# Patient Record
Sex: Female | Born: 1975 | Race: Black or African American | Hispanic: No | Marital: Married | Smoking: Former smoker
Health system: Southern US, Community
[De-identification: ages and names within clinical notes are randomized; demographics above are authoritative.]

## PROBLEM LIST (undated history)

## (undated) ENCOUNTER — Inpatient Hospital Stay (HOSPITAL_COMMUNITY): Payer: Self-pay

## (undated) DIAGNOSIS — E785 Hyperlipidemia, unspecified: Secondary | ICD-10-CM

## (undated) DIAGNOSIS — D649 Anemia, unspecified: Secondary | ICD-10-CM

## (undated) DIAGNOSIS — R51 Headache: Secondary | ICD-10-CM

## (undated) DIAGNOSIS — M1990 Unspecified osteoarthritis, unspecified site: Secondary | ICD-10-CM

## (undated) DIAGNOSIS — IMO0001 Reserved for inherently not codable concepts without codable children: Secondary | ICD-10-CM

## (undated) DIAGNOSIS — I10 Essential (primary) hypertension: Secondary | ICD-10-CM

## (undated) DIAGNOSIS — A599 Trichomoniasis, unspecified: Secondary | ICD-10-CM

## (undated) DIAGNOSIS — E119 Type 2 diabetes mellitus without complications: Secondary | ICD-10-CM

## (undated) DIAGNOSIS — O24419 Gestational diabetes mellitus in pregnancy, unspecified control: Secondary | ICD-10-CM

## (undated) DIAGNOSIS — O1493 Unspecified pre-eclampsia, third trimester: Secondary | ICD-10-CM

## (undated) DIAGNOSIS — R0602 Shortness of breath: Secondary | ICD-10-CM

## (undated) DIAGNOSIS — K869 Disease of pancreas, unspecified: Secondary | ICD-10-CM

## (undated) DIAGNOSIS — J302 Other seasonal allergic rhinitis: Secondary | ICD-10-CM

## (undated) DIAGNOSIS — F419 Anxiety disorder, unspecified: Secondary | ICD-10-CM

## (undated) DIAGNOSIS — A6000 Herpesviral infection of urogenital system, unspecified: Secondary | ICD-10-CM

## (undated) DIAGNOSIS — M2550 Pain in unspecified joint: Secondary | ICD-10-CM

## (undated) HISTORY — DX: Pain in unspecified joint: M25.50

## (undated) HISTORY — DX: Unspecified osteoarthritis, unspecified site: M19.90

## (undated) HISTORY — DX: Hyperlipidemia, unspecified: E78.5

## (undated) HISTORY — DX: Disease of pancreas, unspecified: K86.9

## (undated) HISTORY — PX: TOOTH EXTRACTION: SUR596

## (undated) HISTORY — DX: Gestational diabetes mellitus in pregnancy, unspecified control: O24.419

## (undated) HISTORY — DX: Anxiety disorder, unspecified: F41.9

## (undated) HISTORY — DX: Type 2 diabetes mellitus without complications: E11.9

---

## 2011-04-23 ENCOUNTER — Emergency Department (INDEPENDENT_AMBULATORY_CARE_PROVIDER_SITE_OTHER)
Admission: EM | Admit: 2011-04-23 | Discharge: 2011-04-23 | Disposition: A | Discharge status: Home / Self Care | Payer: Self-pay | Source: Home / Self Care | Attending: Emergency Medicine | Admitting: Emergency Medicine

## 2011-04-23 ENCOUNTER — Encounter: Payer: Self-pay | Admitting: Emergency Medicine

## 2011-04-23 DIAGNOSIS — A6000 Herpesviral infection of urogenital system, unspecified: Secondary | ICD-10-CM

## 2011-04-23 HISTORY — DX: Trichomoniasis, unspecified: A59.9

## 2011-04-23 HISTORY — DX: Herpesviral infection of urogenital system, unspecified: A60.00

## 2011-04-23 HISTORY — DX: Essential (primary) hypertension: I10

## 2011-04-23 LAB — POCT URINALYSIS DIP (DEVICE)
Glucose, UA: NEGATIVE mg/dL
Nitrite: NEGATIVE
Protein, ur: NEGATIVE mg/dL
Urobilinogen, UA: 0.2 mg/dL (ref 0.0–1.0)

## 2011-04-23 LAB — POCT PREGNANCY, URINE: Preg Test, Ur: NEGATIVE

## 2011-04-23 LAB — WET PREP, GENITAL: WBC, Wet Prep HPF POC: NONE SEEN

## 2011-04-23 MED ORDER — ACYCLOVIR 800 MG PO TABS: 800.0000 mg | ORAL_TABLET | Freq: Two times a day (BID) | ORAL | Status: AC

## 2011-04-23 NOTE — ED Notes (Signed)
Pt. Stated, I've had some vaginal itching and burning when I urinate.

## 2011-04-23 NOTE — ED Provider Notes (Signed)
History     CSN: [id number] Arrival date & time: 04/23/2011  6:06 PM   First MD Initiated Contact with Patient 04/23/11 1742      Chief Complaint  Patient presents with  . Vaginal Itching    (Consider location/radiation/quality/duration/timing/severity/associated sxs/prior treatment) HPI Comments: Pt with 2  days vulvar itching, burning, dysuria. No vaginal discharge, but pt states is currently having menses.  No urgency, frequency,  oderous urine, hematuria, known genital blisters, other rash. No fevers, N/V, abd pain, back pain. No recent abx use. Pt sexually active with same female partner who is asxatic. does not use protection. STD's a concern today. States was dx'd with herpes recently but does not know what an outbreak feels like. (+) h/o trichmonoas. No h/o syphilis, GC, chlamydia, BV, HIV.    Patient is a 35 y.o. female presenting with vaginal itching. The history is provided by the patient. No language interpreter was used.  Vaginal Itching The current episode started 2 days ago. The problem occurs constantly. Pertinent negatives include no chest pain and no abdominal pain. The symptoms are aggravated by nothing. The symptoms are relieved by nothing. She has tried nothing for the symptoms.    Past Medical History  Diagnosis Date  . Hypertension   . Herpes genitalia   . Trichomonas     History reviewed. No pertinent past surgical history.  History reviewed. No pertinent family history.  History  Substance Use Topics  . Smoking status: Current Everyday Smoker -- 0.5 packs/day  . Smokeless tobacco: Not on file  . Alcohol Use: 0.0 oz/week    1-2 Glasses of wine per week    OB History    Grav Para Term Preterm Abortions TAB SAB Ect Mult Living                  Review of Systems  Constitutional: Negative for fever.  HENT: Negative for voice change.   Cardiovascular: Negative for chest pain.  Gastrointestinal: Negative for abdominal pain.  Genitourinary:  Positive for dysuria, vaginal bleeding and vaginal pain. Negative for urgency, frequency, hematuria, flank pain and vaginal discharge.  Musculoskeletal: Negative for back pain.    Allergies  Penicillins  Home Medications   Current Outpatient Rx  Name Route Sig Dispense Refill  . HYDROCHLOROTHIAZIDE 25 MG PO TABS Oral Take 25 mg by mouth daily.      . ACYCLOVIR 800 MG PO TABS Oral Take 1 tablet (800 mg total) by mouth 2 (two) times daily. X 5 days 10 tablet 1    BP 128/81  Pulse 84  Temp(Src) 98.2 F (36.8 C) (Oral)  Resp 16  SpO2 100%  LMP 04/23/2011  Physical Exam  Nursing note and vitals reviewed. Constitutional: She is oriented to person, place, and time. She appears well-developed and well-nourished. No distress.  HENT:  Head: Normocephalic and atraumatic.  Eyes: EOM are normal. Pupils are equal, round, and reactive to light.  Neck: Normal range of motion.  Cardiovascular: Normal rate, regular rhythm and normal heart sounds.   Pulmonary/Chest: Effort normal and breath sounds normal.  Abdominal: Soft. Bowel sounds are normal. She exhibits no distension. There is no tenderness. There is no rebound and no guarding.  Genitourinary: Uterus normal. Pelvic exam was performed with patient prone. There is no rash on the right labia. There is no rash on the left labia. Uterus is not tender. Cervix exhibits no motion tenderness, no discharge and no friability. Right adnexum displays no mass, no tenderness and no fullness.  Left adnexum displays no mass, no tenderness and no fullness. There is bleeding around the vagina. No erythema or tenderness around the vagina. No foreign body around the vagina. No vaginal discharge found.       Painful blisters on right vaginal wall. Os normal. (+) menstrual bleeding. Chaperone present during exam  Musculoskeletal: Normal range of motion.  Neurological: She is alert and oriented to person, place, and time.  Skin: Skin is warm and dry.  Psychiatric:  She has a normal mood and affect. Her behavior is normal. Judgment and thought content normal.    ED Course  Procedures (including critical care time)  Labs Reviewed  POCT URINALYSIS DIP (DEVICE) - Abnormal; Notable for the following:    Hgb urine dipstick TRACE (*)    All other components within normal limits  POCT PREGNANCY, URINE  WET PREP, GENITAL  POCT PREGNANCY, URINE  GC/CHLAMYDIA PROBE AMP, GENITAL  RPR  HIV ANTIBODY (ROUTINE TESTING)   No results found.   1. Recurrent genital herpes       MDM  H&P most c/w herpes. Sent off GC/chlamydia, wet prep, HIV, RPR. Will not treat empirically now per pt request. Will send home with acyclovir. Advised pt to refrain from sexual contact until she knows lab results, symptoms resolve, and partner(s) are treated. Pt provided working phone number. Advised to call here in 5 days if pt does not hear from [place]. Pt agrees.     Luiz Blare, MD 04/23/11 2014

## 2011-04-23 NOTE — Discharge Instructions (Signed)
Take the medication as written. Give [place] a working phone number so that we can contact you if needed. You may take tylenol and/or motrin as needed for pain. Refrain from sexual contact until you know your results and your partner(s) are treated. Return if you get worse, have a fever >100.4, or for any concerns.   No Primary Care Doctor Call [facility]  [phone] Other agencies that provide inexpensive medical care    [facility]  [phone]    [facility]  [phone]    [facility]  [phone]    [facility]  [phone] [address] [place] [address]    [facility]  [phone]    [facility]  [phone] [facility] [phone]   [address] [place] [place], [place] [address]

## 2011-04-24 ENCOUNTER — Telehealth (HOSPITAL_COMMUNITY): Payer: Self-pay | Admitting: Emergency Medicine

## 2011-04-24 LAB — RPR: RPR Ser Ql: NONREACTIVE

## 2011-04-24 LAB — GC/CHLAMYDIA PROBE AMP, GENITAL: Chlamydia, DNA Probe: NEGATIVE

## 2011-04-24 MED ORDER — METRONIDAZOLE 500 MG PO TABS: 500.0000 mg | ORAL_TABLET | Freq: Two times a day (BID) | ORAL | Status: AC

## 2011-04-24 NOTE — ED Notes (Signed)
Labs and medications reviewed.  The patient needs tx. for bacterial vaginosis.  Message sent to Dr. Andreas Blower in-basket and order obtained for Flagyl.  Vassie Moselle 04/24/2011

## 2011-04-25 ENCOUNTER — Telehealth (HOSPITAL_COMMUNITY): Payer: Self-pay | Admitting: *Deleted

## 2011-04-25 NOTE — ED Notes (Addendum)
The patient came in with her picture ID, to see her HIV result. Pt. verified and shown her result. Pt. instructed to get retested in 6 mos. Pt. voiced understanding. Vassie Moselle 04/25/2011

## 2011-04-25 NOTE — ED Notes (Signed)
11/28  Telephone order for Flagyl obtained from Dr. Chaney Malling. I called and left pt. message to call. Robin Fox 04/25/2011

## 2011-08-05 ENCOUNTER — Emergency Department (HOSPITAL_COMMUNITY)
Admission: EM | Admit: 2011-08-05 | Discharge: 2011-08-05 | Disposition: A | Discharge status: Home / Self Care | Payer: Self-pay | Source: Home / Self Care

## 2011-08-05 ENCOUNTER — Other Ambulatory Visit: Payer: Self-pay

## 2011-08-05 ENCOUNTER — Encounter (HOSPITAL_COMMUNITY): Payer: Self-pay

## 2011-08-05 DIAGNOSIS — M940 Chondrocostal junction syndrome [Tietze]: Secondary | ICD-10-CM

## 2011-08-05 MED ORDER — TRAMADOL HCL 50 MG PO TABS: 50.0000 mg | ORAL_TABLET | Freq: Four times a day (QID) | ORAL | Status: AC | PRN

## 2011-08-05 MED ORDER — HYDROCHLOROTHIAZIDE 12.5 MG PO TABS: 25.0000 mg | ORAL_TABLET | Freq: Every day | ORAL | Status: DC

## 2011-08-05 NOTE — ED Notes (Addendum)
Reports left sided chest off and on for past few days, and has recently started an exercise routine at the gym, lifting up to 40 lbs, Pain worse in her chest on palpation left CCM, denies cough, congestion, sweats, dizziness. NAD at present, skin w/d/color good, C/o pain "10" on 1-10 scale, but pt calm, conversant; hist of hypertension, smoker. Used zantac, motrin, ASA at time of pain onset earlier today w/o relief  States she moved to area recently, and has been filling her HCTZ from a local [facility], was reportedly provided rx to cover 6 months therapy

## 2011-08-05 NOTE — ED Provider Notes (Signed)
Robin Fox is a 36 y.o. female who presents to Urgent Care today for chest pain of 2 days duration. The patient has past medical history significant for hypertension and smoking. She states that she has had burning/ shooting pain on left chest wall for the past 2 days. She states it comes and goes and is worse with movement of her arms. She states it hurts when she presses on her chest. She denies any shortness of breath or dyspnea on exertion. This pain isn't related to exertion. It usually comes on at rest. She took aspirin and Zantac today without relief and became worried and therefore presented to care.  She denies any history of diabetes. She has no family history of cardiac disease. She has had no lower extremity swelling. No fevers chills. No difficulty breathing. No recent travel. Of note she did start a new workout routine of upper extremity and chest exercises last week. She has not worked out in [employer] for the past several years prior to last week.   PMH reviewed.  ROS as above otherwise neg Medications reviewed. No current facility-administered medications for this encounter.   Current Outpatient Prescriptions  Medication Sig Dispense Refill  . hydrochlorothiazide (HYDRODIURIL) 25 MG tablet Take 25 mg by mouth daily.          Exam:  BP 139/92  Pulse 77  Temp(Src) 98.8 F (37.1 C) (Oral)  Resp 18  SpO2 100% Gen: Well NAD patient lying comfortably on exam table. Non-tachycardic nontachypneic. HEENT: EOMI,  MMM Lungs: CTABL Nl WOB Heart: RRR no MRG Chest: Tender palpation along left sternal border second through fourth ribs. This is productive of her chest pain Abd: NABS, NT, ND Exts: Non edematous BL  LE, warm and well perfused. Calf diameters equal BL 1 cm below tibial tubercle  Assessment and Plan:  1.  chest wall pain: Likely costochondritis. She recently just started a new workout routine. She states she is doing lots of tension presses on heavy weights. She states  this is unusual for her. EKG here showed normal sinus rhythm. Cardiac disease is unlikely in this 36 year old with past history only of smoking and hypertension. PE much less likely she is not tachycardic not tachypneic and very comfortable appearing. Recommend she followup with her primary care provider. Provided her time on ibuprofen for pain relief. Gave her red flags that would prompt her return here or to emergency room.  #2. Hypertension: Patient states she's been out of her hypertensive medications. She is asking for a refill. Provider her one-month refill her hydrochlorothiazide today.  Tobey Grim, MD 08/05/11 [phone]

## 2011-08-06 NOTE — ED Provider Notes (Signed)
Medical screening examination/treatment/procedure(s) were performed by resident physician or non-physician practitioner and as supervising physician I was immediately available for consultation/collaboration.   Jolanda Mccann DOUGLAS MD.    Tipton Ballow D Jayleen Scaglione, MD 08/06/11 0808 

## 2011-09-14 ENCOUNTER — Inpatient Hospital Stay (HOSPITAL_COMMUNITY): Payer: Self-pay

## 2011-09-14 ENCOUNTER — Encounter (HOSPITAL_COMMUNITY): Payer: Self-pay

## 2011-09-14 ENCOUNTER — Inpatient Hospital Stay (HOSPITAL_COMMUNITY)
Admission: AD | Admit: 2011-09-14 | Discharge: 2011-09-14 | Disposition: A | Discharge status: Home / Self Care | Payer: Self-pay | Source: Ambulatory Visit | Attending: Obstetrics & Gynecology | Admitting: Obstetrics & Gynecology

## 2011-09-14 DIAGNOSIS — A499 Bacterial infection, unspecified: Secondary | ICD-10-CM

## 2011-09-14 DIAGNOSIS — N83299 Other ovarian cyst, unspecified side: Secondary | ICD-10-CM

## 2011-09-14 DIAGNOSIS — O10019 Pre-existing essential hypertension complicating pregnancy, unspecified trimester: Secondary | ICD-10-CM

## 2011-09-14 DIAGNOSIS — O2390 Unspecified genitourinary tract infection in pregnancy, unspecified trimester: Secondary | ICD-10-CM | POA: Insufficient documentation

## 2011-09-14 DIAGNOSIS — N83209 Unspecified ovarian cyst, unspecified side: Secondary | ICD-10-CM

## 2011-09-14 DIAGNOSIS — O34599 Maternal care for other abnormalities of gravid uterus, unspecified trimester: Secondary | ICD-10-CM | POA: Insufficient documentation

## 2011-09-14 DIAGNOSIS — O10919 Unspecified pre-existing hypertension complicating pregnancy, unspecified trimester: Secondary | ICD-10-CM

## 2011-09-14 DIAGNOSIS — N760 Acute vaginitis: Secondary | ICD-10-CM

## 2011-09-14 DIAGNOSIS — O26899 Other specified pregnancy related conditions, unspecified trimester: Secondary | ICD-10-CM

## 2011-09-14 DIAGNOSIS — R109 Unspecified abdominal pain: Secondary | ICD-10-CM

## 2011-09-14 DIAGNOSIS — B9689 Other specified bacterial agents as the cause of diseases classified elsewhere: Secondary | ICD-10-CM

## 2011-09-14 HISTORY — DX: Reserved for inherently not codable concepts without codable children: IMO0001

## 2011-09-14 LAB — URINALYSIS, ROUTINE W REFLEX MICROSCOPIC
Bilirubin Urine: NEGATIVE
Glucose, UA: 100 mg/dL — AB
Hgb urine dipstick: NEGATIVE
Protein, ur: NEGATIVE mg/dL
Specific Gravity, Urine: 1.02 (ref 1.005–1.030)
Urobilinogen, UA: 0.2 mg/dL (ref 0.0–1.0)

## 2011-09-14 LAB — CBC
HCT: 33.5 % — ABNORMAL LOW (ref 36.0–46.0)
Hemoglobin: 10.4 g/dL — ABNORMAL LOW (ref 12.0–15.0)
MCH: 20.2 pg — ABNORMAL LOW (ref 26.0–34.0)
MCHC: 31 g/dL (ref 30.0–36.0)
MCV: 64.9 fL — ABNORMAL LOW (ref 78.0–100.0)
Platelets: 341 10*3/uL (ref 150–400)
RBC: 5.16 MIL/uL — ABNORMAL HIGH (ref 3.87–5.11)
RDW: 16.7 % — ABNORMAL HIGH (ref 11.5–15.5)
WBC: 10 10*3/uL (ref 4.0–10.5)

## 2011-09-14 LAB — WET PREP, GENITAL
Trich, Wet Prep: NONE SEEN
Yeast Wet Prep HPF POC: NONE SEEN

## 2011-09-14 LAB — ABO/RH: ABO/RH(D): A POS

## 2011-09-14 LAB — HCG, QUANTITATIVE, PREGNANCY: hCG, Beta Chain, Quant, S: 6598 m[IU]/mL — ABNORMAL HIGH (ref <5)

## 2011-09-14 MED ORDER — METRONIDAZOLE 500 MG PO TABS: 500.0000 mg | ORAL_TABLET | Freq: Two times a day (BID) | ORAL | Status: AC

## 2011-09-14 MED ORDER — METHYLDOPA 500 MG PO TABS: 500.0000 mg | ORAL_TABLET | Freq: Two times a day (BID) | ORAL | Status: DC

## 2011-09-14 NOTE — MAU Note (Signed)
Patient reports positive home test LMP 3/15 having pains in both sides, patient has high blood pressure on HCTZ unsure if she can take.

## 2011-09-14 NOTE — Discharge Instructions (Signed)
Abdominal Pain During Pregnancy  Abdominal discomfort is common in pregnancy. Most of the time, it does not cause harm. There are many causes of abdominal pain. Some causes are more serious than others. Some of the causes of abdominal pain in pregnancy are easily diagnosed. Occasionally, the diagnosis takes time to understand. Other times, the cause is not determined. Abdominal pain can be a sign that something is very wrong with the pregnancy, or the pain may have nothing to do with the pregnancy at all. For this reason, always tell your caregiver if you have any abdominal discomfort.  CAUSES  Common and harmless causes of abdominal pain include:   Constipation.   Excess gas and bloating.   Round ligament pain. This is pain that is felt in the folds of the groin.   The position the baby or placenta is in.   Baby kicks.   Braxton-Hicks contractions. These are mild contractions that do not cause cervical dilation.  Serious causes of abdominal pain include:   Ectopic pregnancy. This happens when a fertilized egg implants outside of the uterus.   Miscarriage.   Preterm labor. This is when labor starts at less than 37 weeks of pregnancy.   Placental abruption. This is when the placenta partially or completely separates from the uterus.   Preeclampsia. This is often associated with high blood pressure and has been referred to as "toxemia in pregnancy."   Uterine or amniotic fluid infections.  Causes unrelated to pregnancy include:   Urinary tract infection.   Gallbladder stones or inflammation.   Hepatitis or other liver illness.   Intestinal problems, stomach flu, food poisoning, or ulcer.   Appendicitis.   Kidney (renal) stones.   Kidney infection (pylonephritis).  HOME CARE INSTRUCTIONS   For mild pain:   Do not have sexual intercourse or put anything in your vagina until your symptoms go away completely.   Get plenty of rest until your pain improves. If your pain does not improve in 1 hour, call  your caregiver.   Drink clear fluids if you feel nauseous. Avoid solid food as long as you are uncomfortable or nauseous.   Only take medicine as directed by your caregiver.   Keep all follow-up appointments with your caregiver.  SEEK IMMEDIATE MEDICAL CARE IF:   You are bleeding, leaking fluid, or passing tissue from the vagina.   You have increasing pain or cramping.   You have persistent vomiting.   You have painful or bloody urination.   You have a fever.   You notice a decrease in your baby's movements.   You have extreme weakness or feel faint.   You have shortness of breath, with or without abdominal pain.   You develop a severe headache with abdominal pain.   You have abnormal vaginal discharge with abdominal pain.   You have persistent diarrhea.   You have abdominal pain that continues even after rest, or gets worse.  MAKE SURE YOU:    Understand these instructions.   Will watch your condition.   Will get help right away if you are not doing well or get worse.  Document Released: 05/13/2005 Document Revised: 05/02/2011 Document Reviewed: 12/07/2010  Exit[employer] Patient Information 2012 [employer].

## 2011-09-14 NOTE — MAU Provider Note (Signed)
History     CSN: [id number]  Arrival date & time 09/14/11  1139   None     Chief Complaint  Patient presents with  . Abdominal Pain    (Consider location/radiation/quality/duration/timing/severity/associated sxs/prior treatment) HPIMarsha Fox is a 36 y.o.  [id number] at [redacted]w[redacted]d. She has had bil low side and abd pain off/on since last evening. Lasts a few min, occurs every  30 min, always with movement. Denies vaginal bleeding, change in discharge. Has frequency, no urgency or burning, has reflux. 1 partner x 8 yr, dx with HSV 2-3 m ago, has had 2 outbreaks since.  Past Medical History  Diagnosis Date  . Hypertension   . Herpes genitalia   . Trichomonas   . Twins     Past Surgical History  Procedure Date  . Cesarean section     History reviewed. No pertinent family history.  History  Substance Use Topics  . Smoking status: Former Smoker -- 0.5 packs/day  . Smokeless tobacco: Not on file  . Alcohol Use: No    OB History    Grav Para Term Preterm Abortions TAB SAB Ect Mult Living   3 2  2      3       Review of Systems  Constitutional: Negative for fever and chills.  Gastrointestinal:       Abdominal pain  Genitourinary: Positive for frequency. Negative for dysuria, urgency, vaginal bleeding and vaginal discharge.  Neurological: Negative for dizziness and syncope.    Allergies  Penicillins  Home Medications  No current outpatient prescriptions on file.  BP 124/75  Pulse 91  Temp(Src) 99.4 F (37.4 C) (Oral)  Resp 18  Ht 5\' 7"  (1.702 m)  Wt 241 lb 12.8 oz (109.68 kg)  BMI 37.87 kg/m2  LMP 08/06/2011  Physical Exam  Constitutional: She is oriented to person, place, and time. She appears well-developed and well-nourished.  Abdominal: Soft. There is no tenderness. There is no rebound and no guarding.  Genitourinary: Uterus normal. There is no rash, tenderness or lesion on the right labia. There is no rash, tenderness or lesion on the left labia. Uterus is  not enlarged and not tender. Cervix exhibits no motion tenderness, no discharge and no friability. Right adnexum displays no mass and no tenderness. Left adnexum displays no mass and no tenderness. No bleeding around the vagina. Vaginal discharge found.  Musculoskeletal: Normal range of motion.  Neurological: She is alert and oriented to person, place, and time.  Skin: Skin is warm and dry.  Psychiatric: She has a normal mood and affect. Her behavior is normal.    ED Course  Procedures (including critical care time)  Labs Reviewed  URINALYSIS, ROUTINE W REFLEX MICROSCOPIC - Abnormal; Notable for the following:    APPearance CLOUDY (*)    Glucose, UA 100 (*)    All other components within normal limits  POCT PREGNANCY, URINE - Abnormal; Notable for the following:    Preg Test, Ur POSITIVE (*)    All other components within normal limits  HCG, QUANTITATIVE, PREGNANCY  CBC  ABO/RH  WET PREP, GENITAL  GC/CHLAMYDIA PROBE AMP, GENITAL   Results for orders placed during the hospital encounter of 09/14/11 (from the past 24 hour(s))  URINALYSIS, ROUTINE W REFLEX MICROSCOPIC     Status: Abnormal   Collection Time   09/14/11 12:00 PM      Component Value Range   Color, Urine YELLOW  YELLOW    APPearance CLOUDY (*) CLEAR  Specific Gravity, Urine 1.020  1.005 - 1.030    pH 6.5  5.0 - 8.0    Glucose, UA 100 (*) NEGATIVE (mg/dL)   Hgb urine dipstick NEGATIVE  NEGATIVE    Bilirubin Urine NEGATIVE  NEGATIVE    Ketones, ur NEGATIVE  NEGATIVE (mg/dL)   Protein, ur NEGATIVE  NEGATIVE (mg/dL)   Urobilinogen, UA 0.2  0.0 - 1.0 (mg/dL)   Nitrite NEGATIVE  NEGATIVE    Leukocytes, UA NEGATIVE  NEGATIVE   POCT PREGNANCY, URINE     Status: Abnormal   Collection Time   09/14/11 12:12 PM      Component Value Range   Preg Test, Ur POSITIVE (*) NEGATIVE   WET PREP, GENITAL     Status: Abnormal   Collection Time   09/14/11 12:45 PM      Component Value Range   Yeast Wet Prep HPF POC NONE SEEN  NONE  SEEN    Trich, Wet Prep NONE SEEN  NONE SEEN    Clue Cells Wet Prep HPF POC MODERATE (*) NONE SEEN    WBC, Wet Prep HPF POC FEW (*) NONE SEEN   HCG, QUANTITATIVE, PREGNANCY     Status: Abnormal   Collection Time   09/14/11 12:48 PM      Component Value Range   hCG, Beta Chain, Quant, S 6598 (*) <5 (mIU/mL)  CBC     Status: Abnormal   Collection Time   09/14/11 12:48 PM      Component Value Range   WBC 10.0  4.0 - 10.5 (K/uL)   RBC 5.16 (*) 3.87 - 5.11 (MIL/uL)   Hemoglobin 10.4 (*) 12.0 - 15.0 (g/dL)   HCT 45.4 (*) 09.8 - 46.0 (%)   MCV 64.9 (*) 78.0 - 100.0 (fL)   MCH 20.2 (*) 26.0 - 34.0 (pg)   MCHC 31.0  30.0 - 36.0 (g/dL)   RDW 11.9 (*) 14.7 - 15.5 (%)   Platelets 341  150 - 400 (K/uL)  ABO/RH     Status: Normal (Preliminary result)   Collection Time   09/14/11 12:48 PM      Component Value Range   ABO/RH(D) A POS      U/S- 5 4/7 wk IUP, EDC 05/12/12, FHR 79. Left fibroid 1.5 x 1.2 x 1.2 cm. No free fluid, complex L ovarian cyst 4.2 x.4.2 x 4.5 cm     ASSESSMENT:   5 4/7 wk IUP L ovarian cyst Bacterial vaginosis Chronic hypertension    P.  Flagyl 500 mg po BID x 7 d Preg verification letter to pt Strongly encouraged to start prenatal care D/C HCTZ  Aldomet 500 mg BID Consulted with Dr Marice Potter   No diagnosis found.    MDM

## 2011-09-16 LAB — GC/CHLAMYDIA PROBE AMP, GENITAL
Chlamydia, DNA Probe: NEGATIVE
GC Probe Amp, Genital: NEGATIVE

## 2011-10-12 ENCOUNTER — Inpatient Hospital Stay (HOSPITAL_COMMUNITY)
Admission: AD | Admit: 2011-10-12 | Discharge: 2011-10-12 | Disposition: A | Discharge status: Home / Self Care | Payer: Self-pay | Source: Ambulatory Visit | Attending: Obstetrics & Gynecology | Admitting: Obstetrics & Gynecology

## 2011-10-12 ENCOUNTER — Encounter (HOSPITAL_COMMUNITY): Payer: Self-pay | Admitting: Obstetrics and Gynecology

## 2011-10-12 DIAGNOSIS — A6000 Herpesviral infection of urogenital system, unspecified: Secondary | ICD-10-CM

## 2011-10-12 DIAGNOSIS — L293 Anogenital pruritus, unspecified: Secondary | ICD-10-CM | POA: Insufficient documentation

## 2011-10-12 DIAGNOSIS — O2390 Unspecified genitourinary tract infection in pregnancy, unspecified trimester: Secondary | ICD-10-CM | POA: Insufficient documentation

## 2011-10-12 DIAGNOSIS — B3731 Acute candidiasis of vulva and vagina: Secondary | ICD-10-CM | POA: Insufficient documentation

## 2011-10-12 DIAGNOSIS — N949 Unspecified condition associated with female genital organs and menstrual cycle: Secondary | ICD-10-CM | POA: Insufficient documentation

## 2011-10-12 DIAGNOSIS — B373 Candidiasis of vulva and vagina: Secondary | ICD-10-CM | POA: Insufficient documentation

## 2011-10-12 LAB — URINALYSIS, ROUTINE W REFLEX MICROSCOPIC
Glucose, UA: NEGATIVE mg/dL
Protein, ur: NEGATIVE mg/dL
Specific Gravity, Urine: 1.025 (ref 1.005–1.030)
pH: 6 (ref 5.0–8.0)

## 2011-10-12 LAB — URINE MICROSCOPIC-ADD ON

## 2011-10-12 LAB — WET PREP, GENITAL: Clue Cells Wet Prep HPF POC: NONE SEEN

## 2011-10-12 MED ORDER — VALACYCLOVIR HCL 1 G PO TABS: 1000.0000 mg | ORAL_TABLET | Freq: Once | ORAL | Status: DC

## 2011-10-12 MED ORDER — FLUCONAZOLE 150 MG PO TABS
150.0000 mg | ORAL_TABLET | Freq: Once | ORAL | Status: AC
  Administered 2011-10-12: 150 mg via ORAL
  Filled 2011-10-12: qty 1

## 2011-10-12 MED ORDER — FLUCONAZOLE 150 MG PO TABS: 150.0000 mg | ORAL_TABLET | Freq: Once | ORAL | Status: AC

## 2011-10-12 MED ORDER — VALACYCLOVIR HCL 500 MG PO TABS
1000.0000 mg | ORAL_TABLET | Freq: Once | ORAL | Status: AC
  Administered 2011-10-12: 1000 mg via ORAL
  Filled 2011-10-12: qty 2

## 2011-10-12 MED ORDER — ACYCLOVIR 400 MG PO TABS: 200.0000 mg | ORAL_TABLET | Freq: Four times a day (QID) | ORAL | Status: AC

## 2011-10-12 NOTE — MAU Provider Note (Signed)
  History     CSN: [id number]  Arrival date and time: 10/12/11 1347   First Provider Initiated Contact with Patient 10/12/11 1427      Chief Complaint  Patient presents with  . Vaginal Discharge   HPI Pt is [redacted]w[redacted]d pregnant and presents with vaginal itching and brown flaky discharge.  Pt did a Vagisil hme kit that said she had yeast.  She called the call a nurse and pt was told to come in to be evaluated.  Pt has not started prenatal care due to waiting on Medicaid. Past Medical History  Diagnosis Date  . Hypertension   . Herpes genitalia   . Trichomonas   . Twins     Past Surgical History  Procedure Date  . Cesarean section     History reviewed. No pertinent family history.  History  Substance Use Topics  . Smoking status: Former Smoker -- 0.5 packs/day  . Smokeless tobacco: Not on file  . Alcohol Use: No    Allergies:  Allergies  Allergen Reactions  . Penicillins Swelling    Prescriptions prior to admission  Medication Sig Dispense Refill  . methyldopa (ALDOMET) 500 MG tablet Take 1 tablet (500 mg total) by mouth 2 (two) times daily.  60 tablet  1  . Prenatal Vit-Fe Fumarate-FA (PRENATAL MULTIVITAMIN) TABS Take 1 tablet by mouth daily.        Review of Systems  Respiratory: Negative for cough.   Gastrointestinal: Negative for nausea, vomiting, abdominal pain, diarrhea and constipation.  Genitourinary: Negative for dysuria and urgency.   Physical Exam   Blood pressure 145/76, pulse 81, temperature 99.5 F (37.5 C), temperature source Oral, resp. rate 81, height 5\' 7"  (1.702 m), weight 248 lb 9.6 oz (112.764 kg), last menstrual period 08/06/2011.  Physical Exam  Vitals reviewed. Constitutional: She appears well-developed and well-nourished.  HENT:  Head: Normocephalic.  Eyes: Pupils are equal, round, and reactive to light.  Neck: Normal range of motion. Neck supple.  Genitourinary:       MAU Course  Procedures No results found for this or any  previous visit (from the past 24 hour(s)). No results found for this or any previous visit (from the past 72 hour(s)).  Assessment and Plan  HSV- Valtrex 1 gm 2 times daily for 10 days the one daily for suppression or Acyclovir 200mg  2 tabs 3 times daily   Meyer Arora 10/12/2011, 2:27 PM

## 2011-10-12 NOTE — MAU Note (Signed)
Pt reports a brown flaky discharge itching and burning for 3 days. Pt also feels like she is getting SOB with activity.

## 2011-11-09 ENCOUNTER — Other Ambulatory Visit: Payer: Self-pay | Admitting: Obstetrics & Gynecology

## 2011-11-09 ENCOUNTER — Telehealth (HOSPITAL_COMMUNITY): Payer: Self-pay | Admitting: *Deleted

## 2011-11-09 DIAGNOSIS — I10 Essential (primary) hypertension: Secondary | ICD-10-CM | POA: Insufficient documentation

## 2011-11-09 DIAGNOSIS — O09529 Supervision of elderly multigravida, unspecified trimester: Secondary | ICD-10-CM | POA: Insufficient documentation

## 2011-11-09 DIAGNOSIS — O10019 Pre-existing essential hypertension complicating pregnancy, unspecified trimester: Secondary | ICD-10-CM | POA: Insufficient documentation

## 2011-11-09 HISTORY — DX: Essential (primary) hypertension: I10

## 2011-11-09 HISTORY — DX: Pre-existing essential hypertension complicating pregnancy, unspecified trimester: O10.019

## 2011-11-09 MED ORDER — METHYLDOPA 500 MG PO TABS
500.0000 mg | ORAL_TABLET | Freq: Two times a day (BID) | ORAL | Status: DC
Start: 2011-11-09 — End: 2011-11-11

## 2011-11-09 NOTE — Progress Notes (Signed)
Patient called MAU requesting refill of Methyldopa, she is [redacted]w[redacted]d with CHTN.  Has appointment to be seen in the clinic in July  She was given a refill, told to check her BP, if above 140/90, to come in for evaluation or for any other obstetric concerns.

## 2011-11-11 ENCOUNTER — Telehealth: Payer: Self-pay | Admitting: *Deleted

## 2011-11-11 DIAGNOSIS — I10 Essential (primary) hypertension: Secondary | ICD-10-CM

## 2011-11-11 DIAGNOSIS — O10019 Pre-existing essential hypertension complicating pregnancy, unspecified trimester: Secondary | ICD-10-CM

## 2011-11-11 MED ORDER — METHYLDOPA 500 MG PO TABS: 500.0000 mg | ORAL_TABLET | Freq: Two times a day (BID) | ORAL | Status: DC

## 2011-11-11 NOTE — Telephone Encounter (Signed)
Message copied by Jill Side on Mon Nov 11, 2011 12:20 PM ------      Message from: Zola Button L      Created: Mon Nov 11, 2011 10:49 AM      Regarding: Pt states pharmacy has not received Rx       Rx was sent in this weekend, but pharmacy says they have not received.  Pt is out of Bp meds.

## 2011-11-11 NOTE — Telephone Encounter (Signed)
Pt notified that Rx has been re-sent. She should call back if she still has problems getting the Rx. Pt voiced understanding.

## 2011-11-21 ENCOUNTER — Ambulatory Visit (INDEPENDENT_AMBULATORY_CARE_PROVIDER_SITE_OTHER): Payer: Self-pay | Admitting: Family

## 2011-11-21 ENCOUNTER — Encounter: Payer: Self-pay | Admitting: Family

## 2011-11-21 VITALS — BP 134/88 | Temp 97.6°F | Wt 250.6 lb

## 2011-11-21 DIAGNOSIS — Z8619 Personal history of other infectious and parasitic diseases: Secondary | ICD-10-CM | POA: Insufficient documentation

## 2011-11-21 DIAGNOSIS — E669 Obesity, unspecified: Secondary | ICD-10-CM | POA: Insufficient documentation

## 2011-11-21 DIAGNOSIS — N83209 Unspecified ovarian cyst, unspecified side: Secondary | ICD-10-CM | POA: Insufficient documentation

## 2011-11-21 DIAGNOSIS — O99210 Obesity complicating pregnancy, unspecified trimester: Secondary | ICD-10-CM

## 2011-11-21 DIAGNOSIS — O09529 Supervision of elderly multigravida, unspecified trimester: Secondary | ICD-10-CM

## 2011-11-21 DIAGNOSIS — O34219 Maternal care for unspecified type scar from previous cesarean delivery: Secondary | ICD-10-CM | POA: Insufficient documentation

## 2011-11-21 DIAGNOSIS — A6000 Herpesviral infection of urogenital system, unspecified: Secondary | ICD-10-CM

## 2011-11-21 DIAGNOSIS — O3490 Maternal care for abnormality of pelvic organ, unspecified, unspecified trimester: Secondary | ICD-10-CM

## 2011-11-21 DIAGNOSIS — A6009 Herpesviral infection of other urogenital tract: Secondary | ICD-10-CM

## 2011-11-21 DIAGNOSIS — I10 Essential (primary) hypertension: Secondary | ICD-10-CM

## 2011-11-21 DIAGNOSIS — O10019 Pre-existing essential hypertension complicating pregnancy, unspecified trimester: Secondary | ICD-10-CM

## 2011-11-21 HISTORY — DX: Personal history of other infectious and parasitic diseases: Z86.19

## 2011-11-21 LAB — POCT URINALYSIS DIP (DEVICE)
Glucose, UA: NEGATIVE mg/dL
Specific Gravity, Urine: >=1.03 (ref 1.005–1.030)
Urobilinogen, UA: 4 mg/dL — ABNORMAL HIGH (ref 0.0–1.0)

## 2011-11-21 NOTE — Progress Notes (Signed)
New ob appointment; reviewed med/ob history; previous csectionx2 ? [facility]>obtain records; NO HX OF PRETERM LABOR (csects for complication of pregnancy); obtain baseline labs due to hypertension, early 1 hr due to hx of GDM and obesity; ob labs and pap completed; schedule anatomy US in three weeks.  Also recheck complex left ovarian cyst at anatomy ultrasound.  FHR obtained via ultrasound. Exam    Uterine Size: size difficult to palpate secondary to weight  Pelvic Exam:    Perineum: No Hemorrhoids, Normal Perineum   Vulva: normal   Vagina:  normal mucosa, normal discharge, no palpable nodules   pH: Not done   Cervix: no bleeding following Pap, no cervical motion tenderness and no lesions   Adnexa: Difficult to palpate secondary secondary to weight   Bony Pelvis: Adequate  System: Breast:  No nipple retraction or dimpling, No nipple discharge or bleeding, No axillary or supraclavicular adenopathy, Normal to palpation without dominant masses   Skin: normal coloration and turgor, no rashes    Neurologic: negative   Extremities: normal strength, tone, and muscle mass   HEENT neck supple with midline trachea and thyroid without masses   Mouth/Teeth mucous membranes moist, pharynx normal without lesions   Neck supple and no masses   Cardiovascular: regular rate and rhythm, no murmurs or gallops   Respiratory:  appears well, vitals normal, no respiratory distress, acyanotic, normal RR, neck free of mass or lymphadenopathy, chest clear, no wheezing, crepitations, rhonchi, normal symmetric air entry   Abdomen: soft, non-tender; bowel sounds normal; no masses,  no organomegaly   Urinary: urethral meatus normal

## 2011-11-21 NOTE — Progress Notes (Signed)
ROI signed for records from [facility]. U/S scheduled December 23, 2011 at 930 am. Genetic Counseling scheduled with MFM on November 26, 2011 at 10 am.

## 2011-11-21 NOTE — Addendum Note (Signed)
Addended by: Doreen Salvage on: 11/21/2011 10:51 AM   Modules accepted: Orders

## 2011-11-21 NOTE — Progress Notes (Signed)
Nutrition Note: (1st visit consult) Pt seen for hx of twin delivery, GDM and HTN (dx 2008). Pt is obese with a weight gain of 30.6# @ [redacted] weeks gestation.   Pt reports very sedentary lifestyle currently, always sleeping and laying around.  Reports poor appetite, maybe 2 meals and snacks daily. No food allergies and some vomiting at night. Disc importance of increasing physical activity.  Encouraged starting with walking qd or qod for 10 minutes at least.  Disc importance of limiting additives to food, especially sodium. Pt reports she will not qualify for [facility] services, but encouraged her to call office and find out income guidelines for a family of 6.  Follow up in 4-6 weeks. Cy Blamer, RD

## 2011-11-21 NOTE — Progress Notes (Signed)
Pulse 96  Patient reports feeling occasional "menstrual-like cramps and a tightening in my stomach"  Patient also reports a toothache Needs 1 hr gtt @ 934

## 2011-11-22 LAB — OBSTETRIC PANEL
Eosinophils Relative: 4 % (ref 0–5)
Hepatitis B Surface Ag: NEGATIVE
Lymphocytes Relative: 23 % (ref 12–46)
Lymphs Abs: 1.7 10*3/uL (ref 0.7–4.0)
MCV: 64.4 fL — ABNORMAL LOW (ref 78.0–100.0)
Neutro Abs: 5.1 10*3/uL (ref 1.7–7.7)
Neutrophils Relative %: 69 % (ref 43–77)
Platelets: 299 10*3/uL (ref 150–400)
RBC: 5.17 MIL/uL — ABNORMAL HIGH (ref 3.87–5.11)
Rubella: 111 IU/mL — ABNORMAL HIGH
WBC: 7.4 10*3/uL (ref 4.0–10.5)

## 2011-11-25 LAB — CBC
HCT: 31.8 % — ABNORMAL LOW (ref 36.0–46.0)
MCH: 21.4 pg — ABNORMAL LOW (ref 26.0–34.0)
MCV: 63.5 fL — ABNORMAL LOW (ref 78.0–100.0)
Platelets: 329 10*3/uL (ref 150–400)
RDW: 19.9 % — ABNORMAL HIGH (ref 11.5–15.5)

## 2011-11-25 LAB — HEMOGLOBINOPATHY EVALUATION
Hemoglobin Other: 0 %
Hgb A2 Quant: 2.6 % (ref 2.2–3.2)
Hgb F Quant: 0.4 % (ref 0.0–2.0)
Hgb S Quant: 0 %

## 2011-11-25 LAB — COMPREHENSIVE METABOLIC PANEL
ALT: 8 U/L (ref 0–35)
Alkaline Phosphatase: 40 U/L (ref 39–117)
Sodium: 135 mEq/L (ref 135–145)
Total Bilirubin: 0.2 mg/dL — ABNORMAL LOW (ref 0.3–1.2)
Total Protein: 6.5 g/dL (ref 6.0–8.3)

## 2011-11-25 NOTE — Addendum Note (Signed)
Addended by: Doreen Salvage on: 11/25/2011 01:38 PM   Modules accepted: Orders

## 2011-11-26 ENCOUNTER — Ambulatory Visit (HOSPITAL_COMMUNITY): Admission: RE | Admit: 2011-11-26 | Payer: Self-pay | Source: Ambulatory Visit

## 2011-11-26 ENCOUNTER — Encounter: Payer: Self-pay | Admitting: Family

## 2011-11-26 DIAGNOSIS — O0990 Supervision of high risk pregnancy, unspecified, unspecified trimester: Secondary | ICD-10-CM | POA: Insufficient documentation

## 2011-11-26 LAB — PROTEIN, URINE, 24 HOUR: Protein, 24H Urine: 96 mg/d (ref 50–100)

## 2011-11-26 LAB — CREATININE CLEARANCE, URINE, 24 HOUR: Creatinine Clearance: 190 mL/min — ABNORMAL HIGH (ref 75–115)

## 2011-11-27 ENCOUNTER — Telehealth: Payer: Self-pay

## 2011-11-27 NOTE — Telephone Encounter (Signed)
Called pt and left message to return the call to the clinics.  Left in message that we are closed Thursday and Friday and will be open Monday 8-5.   Re: Pt missed her MFM appt 11/26/11 for Genetic Counseling for AMA and we need to reschedule appt.

## 2011-12-02 NOTE — Telephone Encounter (Signed)
Robin Fox and informed her she needed to reschedule her MFM consult- Jaliyah agreed and I transferred call to MFM to reschedule her appointment.

## 2011-12-03 ENCOUNTER — Ambulatory Visit (HOSPITAL_COMMUNITY)
Admission: RE | Admit: 2011-12-03 | Discharge: 2011-12-03 | Disposition: A | Discharge status: Home / Self Care | Payer: Self-pay | Source: Ambulatory Visit | Attending: Obstetrics and Gynecology | Admitting: Obstetrics and Gynecology

## 2011-12-03 ENCOUNTER — Encounter (HOSPITAL_COMMUNITY): Payer: Self-pay

## 2011-12-03 VITALS — BP 126/72 | HR 89 | Wt 261.8 lb

## 2011-12-03 DIAGNOSIS — O99210 Obesity complicating pregnancy, unspecified trimester: Secondary | ICD-10-CM

## 2011-12-03 DIAGNOSIS — I10 Essential (primary) hypertension: Secondary | ICD-10-CM

## 2011-12-03 DIAGNOSIS — O3480 Maternal care for other abnormalities of pelvic organs, unspecified trimester: Secondary | ICD-10-CM

## 2011-12-03 DIAGNOSIS — O10019 Pre-existing essential hypertension complicating pregnancy, unspecified trimester: Secondary | ICD-10-CM

## 2011-12-03 DIAGNOSIS — A6009 Herpesviral infection of other urogenital tract: Secondary | ICD-10-CM

## 2011-12-03 DIAGNOSIS — N83209 Unspecified ovarian cyst, unspecified side: Secondary | ICD-10-CM

## 2011-12-03 DIAGNOSIS — O09529 Supervision of elderly multigravida, unspecified trimester: Secondary | ICD-10-CM

## 2011-12-03 DIAGNOSIS — O0990 Supervision of high risk pregnancy, unspecified, unspecified trimester: Secondary | ICD-10-CM

## 2011-12-03 DIAGNOSIS — O34219 Maternal care for unspecified type scar from previous cesarean delivery: Secondary | ICD-10-CM

## 2011-12-03 NOTE — Progress Notes (Signed)
Genetic Counseling  High-Risk Gestation Note  Appointment Date:  12/03/2011 Referred By: Catalina Antigua, MD Date of Birth:  18-Dec-1975    Pregnancy History: [id number] Estimated Date of Delivery: 05/14/12 Estimated Gestational Age: [redacted]w[redacted]d Attending: Alpha Gula, MD    Ms. Zalika Tieszen was seen for genetic counseling because of a maternal age of 36 y.o..     She was counseled regarding maternal age and the association with risk for chromosome conditions due to nondisjunction with aging of the ova.   We reviewed chromosomes, nondisjunction, and the associated 1 in 141 risk for fetal aneuploidy related to a maternal age of 36 y.o. at [redacted]w[redacted]d gestation.  She was counseled that the risk for aneuploidy decreases as gestational age increases, accounting for those pregnancies which spontaneously abort.  We specifically discussed Down syndrome (trisomy 57), trisomies 73 and 67, and sex chromosome aneuploidies (47,XXX and 47,XXY) including the common features and prognoses of each.   We reviewed other available screening options including Quad screen, noninvasive prenatal testing (NIPT), and detailed ultrasound. She understands that screening tests are used to modify a patient's a priori risk for aneuploidy, typically based on age.  This estimate provides a pregnancy specific risk assessment.  Specifically, we discussed that NIPT analyzes cell free fetal DNA found in the maternal circulation. This test is not diagnostic for chromosome conditions, but can provide information regarding the presence or absence of extra fetal DNA for chromosomes 13, 18, 21, X, and Y, and missing fetal DNA for chromosome X and Y (Turner syndrome). Thus, it would not identify or rule out all genetic conditions. The reported detection rate is greater than 99% for Trisomy 21, greater than 98% for Trisomy 18, and is approximately 80% (8 out of 10) for Trisomy 13. The false positive rate is reported to be less than 0.1% for any of these  conditions.  In addition, we discussed that ~50-80% of fetuses with Down syndrome and up to 90-95% of fetuses with trisomy 18/13, when well visualized, have detectable anomalies or soft markers by detailed ultrasound (~18+ weeks gestation).   She was also counseled regarding diagnostic testing via amniocentesis.  We reviewed the approximate 1 in 300-500 risk for complications for amniocentesis, including spontaneous pregnancy loss. We discussed the risks, limitations, and benefits of each screening and testing option. After consideration of all the options, she elected to proceed with detailed ultrasound, but declined all additional screening and testing for aneuploidy including NIPT and amniocentesis. Ms. Sandin stated that she is not concerned regarding the risk for fetal aneuploidy in the pregnancy. She would possibly consider further screening or testing pending targeted ultrasound results.  She understands that ultrasound cannot rule out all birth defects or genetic syndromes. Targeted ultrasound was scheduled in [facility] for 12/18/11.   Ms. Cristle Jared was provided with written information regarding sickle cell anemia (SCA) including the carrier frequency and incidence in the African-American population, the availability of carrier testing and prenatal diagnosis if indicated.  In addition, we discussed that hemoglobinopathies are routinely screened for as part of the [facility] newborn screening panel.  She reported that this screening was likely performed in a past pregnancy and thus declined hemoglobin electrophoresis today. She reported that she signed permission for her OB medical records from [place] to be released to [facility].   Both family histories were reviewed and found to be contributory for mental retardation in a "third or fourth cousin" for the father of the pregnancy. The patient had limited information regarding this  individual and did not know if it was a maternal or paternal  cousin to the father of the pregnancy. We discussed that there are many different causes of mental retardation such as genetic differences, sporadic causes, or injuries.  A specific diagnosis for mental retardation can be determined in approximately 50% of cases.  In the remaining 50% of cases, a diagnosis may not ever be determined.  Without more specific information, potential genetic risks cannot be determined, though recurrence risk would likely be low for the current pregnancy given the reported distant relation.Without further information regarding the provided family history, an accurate genetic risk cannot be calculated. Further genetic counseling is warranted if more information is obtained.  Ms. Kashmir Leedy denied exposure to environmental toxins or chemical agents. She denied the use of alcohol or street drugs. She reported smoking cigarettes in the past but discontinued once becoming aware of the pregnancy. She denied significant viral illnesses during the course of her pregnancy. Her medical and surgical histories were contributory for hypertension, for which she is treated with methyldopa. Ms. Cousin also reported that her first pregnancy, a twin gestation, delivered at [redacted] weeks gestation and one twin was stillborn due to umbilical cord accident. She reported that her second pregnancy, a twin gestation, delivered at [redacted] weeks gestation. Maternal-Fetal Medicine consultation is available, if desired, to discuss pregnancy management regarding this history.   I counseled Ms. Tilden Fossa regarding the above risks and available options.  The approximate face-to-face time with the genetic counselor was 25 minutes.  Quinn Plowman, MS,  Certified Genetic Counselor  12/03/2011

## 2011-12-04 ENCOUNTER — Encounter: Payer: Self-pay | Admitting: Family

## 2011-12-12 ENCOUNTER — Encounter: Payer: Self-pay | Admitting: Family Medicine

## 2011-12-12 ENCOUNTER — Ambulatory Visit (INDEPENDENT_AMBULATORY_CARE_PROVIDER_SITE_OTHER): Payer: Self-pay | Admitting: Family Medicine

## 2011-12-12 VITALS — BP 133/84 | Temp 98.6°F | Wt 253.1 lb

## 2011-12-12 DIAGNOSIS — O09299 Supervision of pregnancy with other poor reproductive or obstetric history, unspecified trimester: Secondary | ICD-10-CM

## 2011-12-12 DIAGNOSIS — O09529 Supervision of elderly multigravida, unspecified trimester: Secondary | ICD-10-CM

## 2011-12-12 DIAGNOSIS — Z8759 Personal history of other complications of pregnancy, childbirth and the puerperium: Secondary | ICD-10-CM | POA: Insufficient documentation

## 2011-12-12 DIAGNOSIS — O10019 Pre-existing essential hypertension complicating pregnancy, unspecified trimester: Secondary | ICD-10-CM

## 2011-12-12 LAB — POCT URINALYSIS DIP (DEVICE)
Bilirubin Urine: NEGATIVE
Glucose, UA: NEGATIVE mg/dL
Hgb urine dipstick: NEGATIVE
Nitrite: NEGATIVE

## 2011-12-12 NOTE — Progress Notes (Signed)
Pulse 97 Patient reports some discomfort from heartburn Patient states she needs a refill on valtrex

## 2011-12-12 NOTE — Progress Notes (Signed)
Patient without complaints.  Denies vaginal bleeding, abnormal vaginal discharge, contractions, loss of fluid.  Reports good fetal activity. [place] next week. Follow up in 3 weeks.

## 2011-12-12 NOTE — Patient Instructions (Addendum)
Pregnancy - Second Trimester The second trimester of pregnancy (3 to 6 months) is a period of rapid growth for you and your baby. At the end of the sixth month, your baby is about 9 inches long and weighs 1 1/2 pounds. You will begin to feel the baby move between 18 and 20 weeks of the pregnancy. This is called quickening. Weight gain is faster. A clear fluid (colostrum) may leak out of your breasts. You may feel small contractions of the womb (uterus). This is known as false labor or Braxton-Hicks contractions. This is like a practice for labor when the baby is ready to be born. Usually, the problems with morning sickness have usually passed by the end of your first trimester. Some women develop small dark blotches (called cholasma, mask of pregnancy) on their face that usually goes away after the baby is born. Exposure to the sun makes the blotches worse. Acne may also develop in some pregnant women and pregnant women who have acne, may find that it goes away. PRENATAL EXAMS  Blood work may continue to be done during prenatal exams. These tests are done to check on your health and the probable health of your baby. Blood work is used to follow your blood levels (hemoglobin). Anemia (low hemoglobin) is common during pregnancy. Iron and vitamins are given to help prevent this. You will also be checked for diabetes between 24 and 28 weeks of the pregnancy. Some of the previous blood tests may be repeated.   The size of the uterus is measured during each visit. This is to make sure that the baby is continuing to grow properly according to the dates of the pregnancy.   Your blood pressure is checked every prenatal visit. This is to make sure you are not getting toxemia.   Your urine is checked to make sure you do not have an infection, diabetes or protein in the urine.   Your weight is checked often to make sure gains are happening at the suggested rate. This is to ensure that both you and your baby are  growing normally.   Sometimes, an ultrasound is performed to confirm the proper growth and development of the baby. This is a test which bounces harmless sound waves off the baby so your caregiver can more accurately determine due dates.  Sometimes, a specialized test is done on the amniotic fluid surrounding the baby. This test is called an amniocentesis. The amniotic fluid is obtained by sticking a needle into the belly (abdomen). This is done to check the chromosomes in instances where there is a concern about possible genetic problems with the baby. It is also sometimes done near the end of pregnancy if an early delivery is required. In this case, it is done to help make sure the baby's lungs are mature enough for the baby to live outside of the womb. CHANGES OCCURING IN THE SECOND TRIMESTER OF PREGNANCY Your body goes through many changes during pregnancy. They vary from person to person. Talk to your caregiver about changes you notice that you are concerned about.  During the second trimester, you will likely have an increase in your appetite. It is normal to have cravings for certain foods. This varies from person to person and pregnancy to pregnancy.   Your lower abdomen will begin to bulge.   You may have to urinate more often because the uterus and baby are pressing on your bladder. It is also common to get more bladder infections during pregnancy (  pain with urination). You can help this by drinking lots of fluids and emptying your bladder before and after intercourse.   You may begin to get stretch marks on your hips, abdomen, and breasts. These are normal changes in the body during pregnancy. There are no exercises or medications to take that prevent this change.   You may begin to develop swollen and bulging veins (varicose veins) in your legs. Wearing support hose, elevating your feet for 15 minutes, 3 to 4 times a day and limiting salt in your diet helps lessen the problem.    Heartburn may develop as the uterus grows and pushes up against the stomach. Antacids recommended by your caregiver helps with this problem. Also, eating smaller meals 4 to 5 times a day helps.   Constipation can be treated with a stool softener or adding bulk to your diet. Drinking lots of fluids, vegetables, fruits, and whole grains are helpful.   Exercising is also helpful. If you have been very active up until your pregnancy, most of these activities can be continued during your pregnancy. If you have been less active, it is helpful to start an exercise program such as walking.   Hemorrhoids (varicose veins in the rectum) may develop at the end of the second trimester. Warm sitz baths and hemorrhoid cream recommended by your caregiver helps hemorrhoid problems.   Backaches may develop during this time of your pregnancy. Avoid heavy lifting, wear low heal shoes and practice good posture to help with backache problems.   Some pregnant women develop tingling and numbness of their hand and fingers because of swelling and tightening of ligaments in the wrist (carpel tunnel syndrome). This goes away after the baby is born.   As your breasts enlarge, you may have to get a bigger bra. Get a comfortable, cotton, support bra. Do not get a nursing bra until the last month of the pregnancy if you will be nursing the baby.   You may get a dark line from your belly button to the pubic area called the linea nigra.   You may develop rosy cheeks because of increase blood flow to the face.   You may develop spider looking lines of the face, neck, arms and chest. These go away after the baby is born.  HOME CARE INSTRUCTIONS   It is extremely important to avoid all smoking, herbs, alcohol, and unprescribed drugs during your pregnancy. These chemicals affect the formation and growth of the baby. Avoid these chemicals throughout the pregnancy to ensure the delivery of a healthy infant.   Most of your home  care instructions are the same as suggested for the first trimester of your pregnancy. Keep your caregiver's appointments. Follow your caregiver's instructions regarding medication use, exercise and diet.   During pregnancy, you are providing food for you and your baby. Continue to eat regular, well-balanced meals. Choose foods such as meat, fish, milk and other low fat dairy products, vegetables, fruits, and whole-grain breads and cereals. Your caregiver will tell you of the ideal weight gain.   A physical sexual relationship may be continued up until near the end of pregnancy if there are no other problems. Problems could include early (premature) leaking of amniotic fluid from the membranes, vaginal bleeding, abdominal pain, or other medical or pregnancy problems.   Exercise regularly if there are no restrictions. Check with your caregiver if you are unsure of the safety of some of your exercises. The greatest weight gain will occur in the   last 2 trimesters of pregnancy. Exercise will help you:   Control your weight.   Get you in shape for labor and delivery.   Lose weight after you have the baby.   Wear a good support or jogging bra for breast tenderness during pregnancy. This may help if worn during sleep. Pads or tissues may be used in the bra if you are leaking colostrum.   Do not use hot tubs, steam rooms or saunas throughout the pregnancy.   Wear your seat belt at all times when driving. This protects you and your baby if you are in an accident.   Avoid raw meat, uncooked cheese, cat litter boxes and soil used by cats. These carry germs that can cause birth defects in the baby.   The second trimester is also a good time to visit your dentist for your dental health if this has not been done yet. Getting your teeth cleaned is OK. Use a soft toothbrush. Brush gently during pregnancy.   It is easier to loose urine during pregnancy. Tightening up and strengthening the pelvic muscles will  help with this problem. Practice stopping your urination while you are going to the bathroom. These are the same muscles you need to strengthen. It is also the muscles you would use as if you were trying to stop from passing gas. You can practice tightening these muscles up 10 times a set and repeating this about 3 times per day. Once you know what muscles to tighten up, do not perform these exercises during urination. It is more likely to contribute to an infection by backing up the urine.   Ask for help if you have financial, counseling or nutritional needs during pregnancy. Your caregiver will be able to offer counseling for these needs as well as refer you for other special needs.   Your skin may become oily. If so, wash your face with mild soap, use non-greasy moisturizer and oil or cream based makeup.  MEDICATIONS AND DRUG USE IN PREGNANCY  Take prenatal vitamins as directed. The vitamin should contain 1 milligram of folic acid. Keep all vitamins out of reach of children. Only a couple vitamins or tablets containing iron may be fatal to a baby or young child when ingested.   Avoid use of all medications, including herbs, over-the-counter medications, not prescribed or suggested by your caregiver. Only take over-the-counter or prescription medicines for pain, discomfort, or fever as directed by your caregiver. Do not use aspirin.   Let your caregiver also know about herbs you may be using.   Alcohol is related to a number of birth defects. This includes fetal alcohol syndrome. All alcohol, in any form, should be avoided completely. Smoking will cause low birth rate and premature babies.   Street or illegal drugs are very harmful to the baby. They are absolutely forbidden. A baby born to an addicted mother will be addicted at birth. The baby will go through the same withdrawal an adult does.  SEEK MEDICAL CARE IF:  You have any concerns or worries during your pregnancy. It is better to call with  your questions if you feel they cannot wait, rather than worry about them. SEEK IMMEDIATE MEDICAL CARE IF:   An unexplained oral temperature above 102 F (38.9 C) develops, or as your caregiver suggests.   You have leaking of fluid from the vagina (birth canal). If leaking membranes are suspected, take your temperature and tell your caregiver of this when you call.   There   is vaginal spotting, bleeding, or passing clots. Tell your caregiver of the amount and how many pads are used. Light spotting in pregnancy is common, especially following intercourse.   You develop a bad smelling vaginal discharge with a change in the color from clear to white.   You continue to feel sick to your stomach (nauseated) and have no relief from remedies suggested. You vomit blood or coffee ground-like materials.   You lose more than 2 pounds of weight or gain more than 2 pounds of weight over 1 week, or as suggested by your caregiver.   You notice swelling of your face, hands, feet, or legs.   You get exposed to German measles and have never had them.   You are exposed to fifth disease or chickenpox.   You develop belly (abdominal) pain. Round ligament discomfort is a common non-cancerous (benign) cause of abdominal pain in pregnancy. Your caregiver still must evaluate you.   You develop a bad headache that does not go away.   You develop fever, diarrhea, pain with urination, or shortness of breath.   You develop visual problems, blurry, or double vision.   You fall or are in a car accident or any kind of trauma.   There is mental or physical violence at home.  Document Released: 05/07/2001 Document Revised: 05/02/2011 Document Reviewed: 11/09/2008 [employer] Patient Information 2012 [employer]. 

## 2011-12-13 LAB — ANTITHROMBIN III: AntiThromb III Func: 116 % (ref 76–126)

## 2011-12-13 LAB — LUPUS ANTICOAGULANT PANEL
DRVVT: 26.2 secs (ref <45.1)
Lupus Anticoagulant: NOT DETECTED
PTT Lupus Anticoagulant: 32.2 secs (ref 28.0–43.0)

## 2011-12-13 LAB — FACTOR 5 LEIDEN

## 2011-12-13 LAB — PROTEIN C ACTIVITY: Protein C Activity: 131 % (ref 75–133)

## 2011-12-14 LAB — PROTEIN C, TOTAL: Protein C, Total: 90 % (ref 72–160)

## 2011-12-16 LAB — PROTHROMBIN GENE MUTATION

## 2011-12-18 ENCOUNTER — Ambulatory Visit (HOSPITAL_COMMUNITY)
Admission: RE | Admit: 2011-12-18 | Discharge: 2011-12-18 | Disposition: A | Discharge status: Home / Self Care | Payer: Self-pay | Source: Ambulatory Visit | Attending: Family | Admitting: Family

## 2011-12-18 VITALS — BP 137/77 | HR 89 | Wt 258.0 lb

## 2011-12-18 DIAGNOSIS — O0990 Supervision of high risk pregnancy, unspecified, unspecified trimester: Secondary | ICD-10-CM

## 2011-12-18 DIAGNOSIS — Z8751 Personal history of pre-term labor: Secondary | ICD-10-CM | POA: Insufficient documentation

## 2011-12-18 DIAGNOSIS — Z1389 Encounter for screening for other disorder: Secondary | ICD-10-CM | POA: Insufficient documentation

## 2011-12-18 DIAGNOSIS — O358XX0 Maternal care for other (suspected) fetal abnormality and damage, not applicable or unspecified: Secondary | ICD-10-CM | POA: Insufficient documentation

## 2011-12-18 DIAGNOSIS — O99210 Obesity complicating pregnancy, unspecified trimester: Secondary | ICD-10-CM

## 2011-12-18 DIAGNOSIS — O09529 Supervision of elderly multigravida, unspecified trimester: Secondary | ICD-10-CM | POA: Insufficient documentation

## 2011-12-18 DIAGNOSIS — O34219 Maternal care for unspecified type scar from previous cesarean delivery: Secondary | ICD-10-CM | POA: Insufficient documentation

## 2011-12-18 DIAGNOSIS — A6000 Herpesviral infection of urogenital system, unspecified: Secondary | ICD-10-CM | POA: Insufficient documentation

## 2011-12-18 DIAGNOSIS — N83209 Unspecified ovarian cyst, unspecified side: Secondary | ICD-10-CM

## 2011-12-18 DIAGNOSIS — Z363 Encounter for antenatal screening for malformations: Secondary | ICD-10-CM | POA: Insufficient documentation

## 2011-12-18 DIAGNOSIS — A6009 Herpesviral infection of other urogenital tract: Secondary | ICD-10-CM

## 2011-12-18 DIAGNOSIS — O09299 Supervision of pregnancy with other poor reproductive or obstetric history, unspecified trimester: Secondary | ICD-10-CM | POA: Insufficient documentation

## 2011-12-18 DIAGNOSIS — O10019 Pre-existing essential hypertension complicating pregnancy, unspecified trimester: Secondary | ICD-10-CM

## 2011-12-18 DIAGNOSIS — I10 Essential (primary) hypertension: Secondary | ICD-10-CM

## 2011-12-18 DIAGNOSIS — O98519 Other viral diseases complicating pregnancy, unspecified trimester: Secondary | ICD-10-CM | POA: Insufficient documentation

## 2011-12-19 LAB — CARDIOLIPIN ANTIBODIES, IGG, IGM, IGA: Anticardiolipin IgA: 11 APL U/mL (ref <22)

## 2011-12-20 ENCOUNTER — Encounter: Payer: Self-pay | Admitting: Family

## 2011-12-23 ENCOUNTER — Ambulatory Visit (HOSPITAL_COMMUNITY): Payer: Self-pay

## 2012-01-02 ENCOUNTER — Ambulatory Visit (INDEPENDENT_AMBULATORY_CARE_PROVIDER_SITE_OTHER): Payer: Self-pay | Admitting: Physician Assistant

## 2012-01-02 VITALS — BP 139/87 | Temp 98.1°F | Wt 256.3 lb

## 2012-01-02 DIAGNOSIS — O34219 Maternal care for unspecified type scar from previous cesarean delivery: Secondary | ICD-10-CM

## 2012-01-02 DIAGNOSIS — Z8759 Personal history of other complications of pregnancy, childbirth and the puerperium: Secondary | ICD-10-CM

## 2012-01-02 DIAGNOSIS — O09299 Supervision of pregnancy with other poor reproductive or obstetric history, unspecified trimester: Secondary | ICD-10-CM

## 2012-01-02 DIAGNOSIS — Z98891 History of uterine scar from previous surgery: Secondary | ICD-10-CM

## 2012-01-02 LAB — POCT URINALYSIS DIP (DEVICE)
Ketones, ur: NEGATIVE mg/dL
Nitrite: NEGATIVE
Protein, ur: 30 mg/dL — AB
pH: 6 (ref 5.0–8.0)

## 2012-01-02 NOTE — Patient Instructions (Addendum)
Pregnancy - Second Trimester The second trimester of pregnancy (3 to 6 months) is a period of rapid growth for you and your baby. At the end of the sixth month, your baby is about 9 inches long and weighs 1 1/2 pounds. You will begin to feel the baby move between 18 and 20 weeks of the pregnancy. This is called quickening. Weight gain is faster. A clear fluid (colostrum) may leak out of your breasts. You may feel small contractions of the womb (uterus). This is known as false labor or Braxton-Hicks contractions. This is like a practice for labor when the baby is ready to be born. Usually, the problems with morning sickness have usually passed by the end of your first trimester. Some women develop small dark blotches (called cholasma, mask of pregnancy) on their face that usually goes away after the baby is born. Exposure to the sun makes the blotches worse. Acne may also develop in some pregnant women and pregnant women who have acne, may find that it goes away. PRENATAL EXAMS  Blood work may continue to be done during prenatal exams. These tests are done to check on your health and the probable health of your baby. Blood work is used to follow your blood levels (hemoglobin). Anemia (low hemoglobin) is common during pregnancy. Iron and vitamins are given to help prevent this. You will also be checked for diabetes between 24 and 28 weeks of the pregnancy. Some of the previous blood tests may be repeated.   The size of the uterus is measured during each visit. This is to make sure that the baby is continuing to grow properly according to the dates of the pregnancy.   Your blood pressure is checked every prenatal visit. This is to make sure you are not getting toxemia.   Your urine is checked to make sure you do not have an infection, diabetes or protein in the urine.   Your weight is checked often to make sure gains are happening at the suggested rate. This is to ensure that both you and your baby are  growing normally.   Sometimes, an ultrasound is performed to confirm the proper growth and development of the baby. This is a test which bounces harmless sound waves off the baby so your caregiver can more accurately determine due dates.  Sometimes, a specialized test is done on the amniotic fluid surrounding the baby. This test is called an amniocentesis. The amniotic fluid is obtained by sticking a needle into the belly (abdomen). This is done to check the chromosomes in instances where there is a concern about possible genetic problems with the baby. It is also sometimes done near the end of pregnancy if an early delivery is required. In this case, it is done to help make sure the baby's lungs are mature enough for the baby to live outside of the womb. CHANGES OCCURING IN THE SECOND TRIMESTER OF PREGNANCY Your body goes through many changes during pregnancy. They vary from person to person. Talk to your caregiver about changes you notice that you are concerned about.  During the second trimester, you will likely have an increase in your appetite. It is normal to have cravings for certain foods. This varies from person to person and pregnancy to pregnancy.   Your lower abdomen will begin to bulge.   You may have to urinate more often because the uterus and baby are pressing on your bladder. It is also common to get more bladder infections during pregnancy (  pain with urination). You can help this by drinking lots of fluids and emptying your bladder before and after intercourse.   You may begin to get stretch marks on your hips, abdomen, and breasts. These are normal changes in the body during pregnancy. There are no exercises or medications to take that prevent this change.   You may begin to develop swollen and bulging veins (varicose veins) in your legs. Wearing support hose, elevating your feet for 15 minutes, 3 to 4 times a day and limiting salt in your diet helps lessen the problem.    Heartburn may develop as the uterus grows and pushes up against the stomach. Antacids recommended by your caregiver helps with this problem. Also, eating smaller meals 4 to 5 times a day helps.   Constipation can be treated with a stool softener or adding bulk to your diet. Drinking lots of fluids, vegetables, fruits, and whole grains are helpful.   Exercising is also helpful. If you have been very active up until your pregnancy, most of these activities can be continued during your pregnancy. If you have been less active, it is helpful to start an exercise program such as walking.   Hemorrhoids (varicose veins in the rectum) may develop at the end of the second trimester. Warm sitz baths and hemorrhoid cream recommended by your caregiver helps hemorrhoid problems.   Backaches may develop during this time of your pregnancy. Avoid heavy lifting, wear low heal shoes and practice good posture to help with backache problems.   Some pregnant women develop tingling and numbness of their hand and fingers because of swelling and tightening of ligaments in the wrist (carpel tunnel syndrome). This goes away after the baby is born.   As your breasts enlarge, you may have to get a bigger bra. Get a comfortable, cotton, support bra. Do not get a nursing bra until the last month of the pregnancy if you will be nursing the baby.   You may get a dark line from your belly button to the pubic area called the linea nigra.   You may develop rosy cheeks because of increase blood flow to the face.   You may develop spider looking lines of the face, neck, arms and chest. These go away after the baby is born.  HOME CARE INSTRUCTIONS   It is extremely important to avoid all smoking, herbs, alcohol, and unprescribed drugs during your pregnancy. These chemicals affect the formation and growth of the baby. Avoid these chemicals throughout the pregnancy to ensure the delivery of a healthy infant.   Most of your home  care instructions are the same as suggested for the first trimester of your pregnancy. Keep your caregiver's appointments. Follow your caregiver's instructions regarding medication use, exercise and diet.   During pregnancy, you are providing food for you and your baby. Continue to eat regular, well-balanced meals. Choose foods such as meat, fish, milk and other low fat dairy products, vegetables, fruits, and whole-grain breads and cereals. Your caregiver will tell you of the ideal weight gain.   A physical sexual relationship may be continued up until near the end of pregnancy if there are no other problems. Problems could include early (premature) leaking of amniotic fluid from the membranes, vaginal bleeding, abdominal pain, or other medical or pregnancy problems.   Exercise regularly if there are no restrictions. Check with your caregiver if you are unsure of the safety of some of your exercises. The greatest weight gain will occur in the   last 2 trimesters of pregnancy. Exercise will help you:   Control your weight.   Get you in shape for labor and delivery.   Lose weight after you have the baby.   Wear a good support or jogging bra for breast tenderness during pregnancy. This may help if worn during sleep. Pads or tissues may be used in the bra if you are leaking colostrum.   Do not use hot tubs, steam rooms or saunas throughout the pregnancy.   Wear your seat belt at all times when driving. This protects you and your baby if you are in an accident.   Avoid raw meat, uncooked cheese, cat litter boxes and soil used by cats. These carry germs that can cause birth defects in the baby.   The second trimester is also a good time to visit your dentist for your dental health if this has not been done yet. Getting your teeth cleaned is OK. Use a soft toothbrush. Brush gently during pregnancy.   It is easier to loose urine during pregnancy. Tightening up and strengthening the pelvic muscles will  help with this problem. Practice stopping your urination while you are going to the bathroom. These are the same muscles you need to strengthen. It is also the muscles you would use as if you were trying to stop from passing gas. You can practice tightening these muscles up 10 times a set and repeating this about 3 times per day. Once you know what muscles to tighten up, do not perform these exercises during urination. It is more likely to contribute to an infection by backing up the urine.   Ask for help if you have financial, counseling or nutritional needs during pregnancy. Your caregiver will be able to offer counseling for these needs as well as refer you for other special needs.   Your skin may become oily. If so, wash your face with mild soap, use non-greasy moisturizer and oil or cream based makeup.  MEDICATIONS AND DRUG USE IN PREGNANCY  Take prenatal vitamins as directed. The vitamin should contain 1 milligram of folic acid. Keep all vitamins out of reach of children. Only a couple vitamins or tablets containing iron may be fatal to a baby or young child when ingested.   Avoid use of all medications, including herbs, over-the-counter medications, not prescribed or suggested by your caregiver. Only take over-the-counter or prescription medicines for pain, discomfort, or fever as directed by your caregiver. Do not use aspirin.   Let your caregiver also know about herbs you may be using.   Alcohol is related to a number of birth defects. This includes fetal alcohol syndrome. All alcohol, in any form, should be avoided completely. Smoking will cause low birth rate and premature babies.   Street or illegal drugs are very harmful to the baby. They are absolutely forbidden. A baby born to an addicted mother will be addicted at birth. The baby will go through the same withdrawal an adult does.  SEEK MEDICAL CARE IF:  You have any concerns or worries during your pregnancy. It is better to call with  your questions if you feel they cannot wait, rather than worry about them. SEEK IMMEDIATE MEDICAL CARE IF:   An unexplained oral temperature above 102 F (38.9 C) develops, or as your caregiver suggests.   You have leaking of fluid from the vagina (birth canal). If leaking membranes are suspected, take your temperature and tell your caregiver of this when you call.   There   is vaginal spotting, bleeding, or passing clots. Tell your caregiver of the amount and how many pads are used. Light spotting in pregnancy is common, especially following intercourse.   You develop a bad smelling vaginal discharge with a change in the color from clear to white.   You continue to feel sick to your stomach (nauseated) and have no relief from remedies suggested. You vomit blood or coffee ground-like materials.   You lose more than 2 pounds of weight or gain more than 2 pounds of weight over 1 week, or as suggested by your caregiver.   You notice swelling of your face, hands, feet, or legs.   You get exposed to [place] measles and have never had them.   You are exposed to fifth disease or chickenpox.   You develop belly (abdominal) pain. Round ligament discomfort is a common non-cancerous (benign) cause of abdominal pain in pregnancy. Your caregiver still must evaluate you.   You develop a bad headache that does not go away.   You develop fever, diarrhea, pain with urination, or shortness of breath.   You develop visual problems, blurry, or double vision.   You fall or are in a car accident or any kind of trauma.   There is mental or physical violence at home.  Document Released: 05/07/2001 Document Revised: 05/02/2011 Document Reviewed: 11/09/2008 [facility] Patient Information 2012 [place], [place].Glucose Tolerance Test This is a test to see how your body processes carbohydrates. This test is often done to check patients for diabetes or the possibility of developing it. PREPARATION FOR TEST You  should have nothing to eat or drink 12 hours before the test. You will be given a form of sugar (glucose) and then blood samples will be drawn from your vein to determine the level of sugar in your blood. Alternatively, blood may be drawn from your finger for testing. You should not smoke or exercise during the test. NORMAL FINDINGS  Fasting: 70-115 mg/dL   30 minutes: less than 200 mg/dL   1 hour: less than 295 mg/dL   2 hours: less than 284 mg/dL   3 hours: 13-244 mg/dL   4 hours: 01-027 mg/dL  Ranges for normal findings may vary among different laboratories and hospitals. You should always check with your doctor after having lab work or other tests done to discuss the meaning of your test results and whether your values are considered within normal limits. MEANING OF TEST Your caregiver will go over the test results with you and discuss the importance and meaning of your results, as well as treatment options and the need for additional tests. OBTAINING THE TEST RESULTS It is your responsibility to obtain your test results. Ask the lab or department performing the test when and how you will get your results. Document Released: 06/05/2004 Document Revised: 05/02/2011 Document Reviewed: 04/23/2008 [facility] Patient Information 2012 [place], [place].

## 2012-01-02 NOTE — Progress Notes (Signed)
Informal [place] for viability- +cardiac, FHR 140 per m-mode, +FM.    Random CBG = 187

## 2012-01-02 NOTE — Progress Notes (Signed)
Anticipatory guidance. Will sent [facility] again for records. 2 prev sections preterm. LT skin incision. ? Classical given gestation age. Reviewed NL labs for coag work-up. Reviewed abd normal 1 hour. Will complete 3 GTT on Friday. +FM on doppler. Will get [place] for HR.

## 2012-01-02 NOTE — Progress Notes (Signed)
Pulse 102 Pt concerned about feeling very dizzy. Notices that her gums are bleeding a lot.  Wants to know if she needs to take valtrex daily? If so she would like a written rx to take to the pharmacy.

## 2012-01-03 ENCOUNTER — Other Ambulatory Visit: Payer: Self-pay

## 2012-01-03 DIAGNOSIS — R7309 Other abnormal glucose: Secondary | ICD-10-CM

## 2012-01-03 DIAGNOSIS — O0990 Supervision of high risk pregnancy, unspecified, unspecified trimester: Secondary | ICD-10-CM

## 2012-01-04 LAB — GLUCOSE TOLERANCE, 3 HOURS: Glucose Tolerance, 2 hour: 210 mg/dL — ABNORMAL HIGH (ref 70–164)

## 2012-01-06 ENCOUNTER — Telehealth: Payer: Self-pay

## 2012-01-06 NOTE — Telephone Encounter (Signed)
Called pt and left message to return the call to the clinics.  

## 2012-01-06 NOTE — Telephone Encounter (Signed)
Message copied by Faythe Casa on Mon Jan 06, 2012  2:03 PM ------      Message from: August Luz      Created: Sat Jan 04, 2012  6:15 AM       Failed 3 GTT. Needs to see Maggie on 8/19

## 2012-01-06 NOTE — Telephone Encounter (Signed)
Pt returned call and I informed her of her results and that she has an appt scheduled with Maggie on the 01/13/12 @ 1030 am.  Pt stated understanding and had no further questions at this time.

## 2012-01-13 ENCOUNTER — Encounter: Payer: Self-pay | Attending: Family Medicine | Admitting: Dietician

## 2012-01-13 ENCOUNTER — Ambulatory Visit (INDEPENDENT_AMBULATORY_CARE_PROVIDER_SITE_OTHER): Payer: Self-pay | Admitting: *Deleted

## 2012-01-13 DIAGNOSIS — O99810 Abnormal glucose complicating pregnancy: Secondary | ICD-10-CM | POA: Insufficient documentation

## 2012-01-13 DIAGNOSIS — Z713 Dietary counseling and surveillance: Secondary | ICD-10-CM | POA: Insufficient documentation

## 2012-01-13 NOTE — Progress Notes (Signed)
Diabetes Education:Client G3P3 (twins) seen today with GDM.  Has elevated 3 hr OGTT.  Ht: 67.5 in and Wt: 256.2 lb  Has family history of DM 2 and had GDM with her twins of the last pregnancy 5 years ago.  Here in [place], she does not have insurance and states that she does not qualify for Medicaid.  Completed review of the carb restricted diet for GDM.  Provided a True Track Meter Kit [id number] Exp: 2012/12/24  And 1 box strips [id number] Exp: 2014/02/23 and 1 box lancets Lot [id number]  Exp: 2015/10/28.  On return demonstration of BGM, her glucose was 194 mg.  This was after a breakfast of [place] Cheerios.  Reviewed the need to use the unsweetened cereals.  To Follow-up with MD visit on 01/14/12.  Maggie Zayvian Mcmurtry, RN, RD, CDE

## 2012-01-23 ENCOUNTER — Encounter: Payer: Self-pay | Admitting: Physician Assistant

## 2012-01-23 ENCOUNTER — Ambulatory Visit (INDEPENDENT_AMBULATORY_CARE_PROVIDER_SITE_OTHER): Payer: Self-pay | Admitting: Physician Assistant

## 2012-01-23 VITALS — BP 125/83 | Temp 98.0°F | Wt 257.5 lb

## 2012-01-23 DIAGNOSIS — O24419 Gestational diabetes mellitus in pregnancy, unspecified control: Secondary | ICD-10-CM

## 2012-01-23 DIAGNOSIS — O10019 Pre-existing essential hypertension complicating pregnancy, unspecified trimester: Secondary | ICD-10-CM

## 2012-01-23 DIAGNOSIS — O99810 Abnormal glucose complicating pregnancy: Secondary | ICD-10-CM

## 2012-01-23 LAB — POCT URINALYSIS DIP (DEVICE)
Bilirubin Urine: NEGATIVE
Glucose, UA: 100 mg/dL — AB
Nitrite: NEGATIVE
pH: 6.5 (ref 5.0–8.0)

## 2012-01-23 MED ORDER — GLYBURIDE 2.5 MG PO TABS: 2.5000 mg | ORAL_TABLET | Freq: Two times a day (BID) | ORAL | Status: DC

## 2012-01-23 NOTE — Progress Notes (Signed)
Pulse: 98 Has urinary symptoms, feels like she needs to void but unable. Feels pressure also states that she has painful contractions that cause her to stop activity.

## 2012-01-23 NOTE — Progress Notes (Signed)
U/S scheduled Sept. 5, 2013 at 915 am.

## 2012-01-23 NOTE — Patient Instructions (Signed)
Gestational Diabetes Mellitus Gestational diabetes mellitus (GDM) is diabetes that occurs only during pregnancy. This happens when the body cannot properly handle the glucose (sugar) that increases in the blood after eating. During pregnancy, insulin resistance (reduced sensitivity to insulin) occurs because of the release of hormones from the placenta. Usually, the pancreas of pregnant women produces enough insulin to overcome the resistance that occurs. However, in gestational diabetes, the insulin is there but it does not work effectively. If the resistance is severe enough that the pancreas does not produce enough insulin, extra glucose builds up in the blood.  WHO IS AT RISK FOR DEVELOPING GESTATIONAL DIABETES?  Women with a history of diabetes in the family.   Women over age 25.   Women who are overweight.   Women in certain ethnic groups (Hispanic, African American, Native American, Asian and Pacific Islander).  WHAT CAN HAPPEN TO THE BABY? If the mother's blood glucose is too high while she is pregnant, the extra sugar will travel through the umbilical cord to the baby. Some of the problems the baby may have are:  Large Baby - If the baby receives too much sugar, the baby will gain more weight. This may cause the baby to be too large to be born normally (vaginally) and a Cesarean section (C-section) may be needed.   Low Blood Glucose (hypoglycemia) - The baby makes extra insulin, in response to the extra sugar its gets from its mother. When the baby is born and no longer needs this extra insulin, the baby's blood glucose level may drop.   Jaundice (yellow coloring of the skin and eyes) - This is fairly common in babies. It is caused from a build-up of the chemical called bilirubin. This is rarely serious, but is seen more often in babies whose mothers had gestational diabetes.  RISKS TO THE MOTHER Women who have had gestational diabetes may be at higher risk for some problems,  including:  Preeclampsia or toxemia, which includes problems with high blood pressure. Blood pressure and protein levels in the urine must be checked frequently.   Infections.   Cesarean section (C-section) for delivery.   Developing Type 2 diabetes later in life. About 30-50% will develop diabetes later, especially if obese.  DIAGNOSIS  The hormones that cause insulin resistance are highest at about 24-28 weeks of pregnancy. If symptoms are experienced, they are much like symptoms you would normally expect during pregnancy.  GDM is often diagnosed using a two part method: 1. After 24-28 weeks of pregnancy, the woman drinks a glucose solution and takes a blood test. If the glucose level is high, a second test will be given.  2. Oral Glucose Tolerance Test (OGTT) which is 3 hours long - After not eating overnight, the blood glucose is checked. The woman drinks a glucose solution, and hourly blood glucose tests are taken.  If the woman has risk factors for GDM, the caregiver may test earlier than 24 weeks of pregnancy. TREATMENT  Treatment of GDM is directed at keeping the mother's blood glucose level normal, and may include:  Meal planning.   Taking insulin or other medicine to control your blood glucose level.   Exercise.   Keeping a daily record of the foods you eat.   Blood glucose monitoring and keeping a record of your blood glucose levels.   May monitor ketone levels in the urine, although this is no longer considered necessary in most pregnancies.  HOME CARE INSTRUCTIONS  While you are pregnant:    Follow your caregiver's advice regarding your prenatal appointments, meal planning, exercise, medicines, vitamins, blood and other tests, and physical activities.   Keep a record of your meals, blood glucose tests, and the amount of insulin you are taking (if any). Show this to your caregiver at every prenatal visit.   If you have GDM, you may have problems with hypoglycemia (low  blood glucose). You may suspect this if you become suddenly dizzy, feel shaky, and/or weak. If you think this is happening and you have a glucose meter, try to test your blood glucose level. Follow your caregiver's advice for when and how to treat your low blood glucose. Generally, the 15:15 rule is followed: Treat by consuming 15 grams of carbohydrates, wait 15 minutes, and recheck blood glucose. Examples of 15 grams of carbohydrates are:   1 cup skim or low-fat milk.    cup juice.   3-4 glucose tablets.   5-6 hard candies.   1 small box raisins.    cup regular soda pop.   Practice good hygiene, to avoid infections.   Do not smoke.  SEEK MEDICAL CARE IF:   You develop abnormal vaginal discharge, with or without itching.   You become weak and tired more than expected.   You seem to sweat a lot.   You have a sudden increase in weight, 5 pounds or more in one week.   You are losing weight, 3 pounds or more in a week.   Your blood glucose level is high, and you need instructions on what to do about it.  SEEK IMMEDIATE MEDICAL CARE IF:   You develop a severe headache.   You faint or pass out.   You develop nausea and vomiting.   You become disoriented or confused.   You have a convulsion.   You develop vision problems.   You develop stomach pain.   You develop vaginal bleeding.   You develop uterine contractions.   You have leaking or a gush of fluid from the vagina.  AFTER YOU HAVE THE BABY:  Go to all of your follow-up appointments, and have blood tests as advised by your caregiver.   Maintain a healthy lifestyle, to prevent diabetes in the future. This includes:   Following a healthy meal plan.   Controlling your weight.   Getting enough exercise and proper rest.   Do not smoke.   Breastfeed your baby if you can. This will lower the chance of you and your baby developing diabetes later in life.  For more information about diabetes, go to the American  Diabetes Association at: www.americandiabetesassociation.org. For more information about gestational diabetes, go to the American Congress of Obstetricians and Gynecologists at: www.acog.org. Document Released: 08/19/2000 Document Revised: 05/02/2011 Document Reviewed: 03/13/2009 [employer] Patient Information 2012 [employer]. 

## 2012-01-23 NOTE — Progress Notes (Signed)
Reports irregular lower abd cramping and vaginal pressure. Cervix: closed/thick/post today. PTL and Pre-x precautions reviewed. New dx GDM (prob Type II). All fasting >100, 2 hours pp: <125 (132, 135). Will start Glyburide 2.5 mg po @ hs. Follow up growth [place] ordered.

## 2012-01-30 ENCOUNTER — Ambulatory Visit (HOSPITAL_COMMUNITY)
Admission: RE | Admit: 2012-01-30 | Discharge: 2012-01-30 | Disposition: A | Discharge status: Home / Self Care | Payer: Self-pay | Source: Ambulatory Visit | Attending: Physician Assistant | Admitting: Physician Assistant

## 2012-01-30 ENCOUNTER — Other Ambulatory Visit: Payer: Self-pay | Admitting: Physician Assistant

## 2012-01-30 DIAGNOSIS — O09299 Supervision of pregnancy with other poor reproductive or obstetric history, unspecified trimester: Secondary | ICD-10-CM | POA: Insufficient documentation

## 2012-01-30 DIAGNOSIS — A6000 Herpesviral infection of urogenital system, unspecified: Secondary | ICD-10-CM | POA: Insufficient documentation

## 2012-01-30 DIAGNOSIS — O09529 Supervision of elderly multigravida, unspecified trimester: Secondary | ICD-10-CM | POA: Insufficient documentation

## 2012-01-30 DIAGNOSIS — Z8751 Personal history of pre-term labor: Secondary | ICD-10-CM | POA: Insufficient documentation

## 2012-01-30 DIAGNOSIS — O10019 Pre-existing essential hypertension complicating pregnancy, unspecified trimester: Secondary | ICD-10-CM | POA: Insufficient documentation

## 2012-01-30 DIAGNOSIS — O99810 Abnormal glucose complicating pregnancy: Secondary | ICD-10-CM | POA: Insufficient documentation

## 2012-01-30 DIAGNOSIS — O34219 Maternal care for unspecified type scar from previous cesarean delivery: Secondary | ICD-10-CM | POA: Insufficient documentation

## 2012-01-30 DIAGNOSIS — O98519 Other viral diseases complicating pregnancy, unspecified trimester: Secondary | ICD-10-CM | POA: Insufficient documentation

## 2012-01-30 DIAGNOSIS — O24419 Gestational diabetes mellitus in pregnancy, unspecified control: Secondary | ICD-10-CM

## 2012-02-03 ENCOUNTER — Encounter: Payer: Self-pay | Attending: Family Medicine | Admitting: Dietician

## 2012-02-03 ENCOUNTER — Ambulatory Visit (INDEPENDENT_AMBULATORY_CARE_PROVIDER_SITE_OTHER): Payer: Self-pay | Admitting: Obstetrics & Gynecology

## 2012-02-03 VITALS — BP 126/77 | Temp 96.9°F | Wt 255.0 lb

## 2012-02-03 DIAGNOSIS — O0990 Supervision of high risk pregnancy, unspecified, unspecified trimester: Secondary | ICD-10-CM

## 2012-02-03 DIAGNOSIS — O99810 Abnormal glucose complicating pregnancy: Secondary | ICD-10-CM | POA: Insufficient documentation

## 2012-02-03 DIAGNOSIS — A6000 Herpesviral infection of urogenital system, unspecified: Secondary | ICD-10-CM

## 2012-02-03 DIAGNOSIS — O3480 Maternal care for other abnormalities of pelvic organs, unspecified trimester: Secondary | ICD-10-CM

## 2012-02-03 DIAGNOSIS — O34219 Maternal care for unspecified type scar from previous cesarean delivery: Secondary | ICD-10-CM

## 2012-02-03 DIAGNOSIS — N83209 Unspecified ovarian cyst, unspecified side: Secondary | ICD-10-CM

## 2012-02-03 DIAGNOSIS — O10019 Pre-existing essential hypertension complicating pregnancy, unspecified trimester: Secondary | ICD-10-CM

## 2012-02-03 DIAGNOSIS — O3490 Maternal care for abnormality of pelvic organ, unspecified, unspecified trimester: Secondary | ICD-10-CM

## 2012-02-03 DIAGNOSIS — I10 Essential (primary) hypertension: Secondary | ICD-10-CM

## 2012-02-03 DIAGNOSIS — Z713 Dietary counseling and surveillance: Secondary | ICD-10-CM | POA: Insufficient documentation

## 2012-02-03 DIAGNOSIS — Z8759 Personal history of other complications of pregnancy, childbirth and the puerperium: Secondary | ICD-10-CM

## 2012-02-03 DIAGNOSIS — A6009 Herpesviral infection of other urogenital tract: Secondary | ICD-10-CM

## 2012-02-03 DIAGNOSIS — O09299 Supervision of pregnancy with other poor reproductive or obstetric history, unspecified trimester: Secondary | ICD-10-CM

## 2012-02-03 LAB — GLUCOSE, CAPILLARY: Glucose-Capillary: 129 mg/dL — ABNORMAL HIGH (ref 70–99)

## 2012-02-03 NOTE — Progress Notes (Incomplete)
Diabetes Education:  Has lost lancing device.  Had a random glucose drawn.  Provided replacement lancing device and 1 box of strips Lot: [id number] Exp: 2014/02/23.  Reviewed the need to monitor portion sizes and to use food labels when available.  Maggie Cartha Rotert, RN, CDE

## 2012-02-03 NOTE — Patient Instructions (Signed)
Return to clinic for any obstetric concerns or go to [facility] for evaluation  

## 2012-02-03 NOTE — Progress Notes (Signed)
Pulse 97 No pain; pressure in pelvic.

## 2012-02-03 NOTE — Progress Notes (Signed)
Did not bring log book, random BS in clinic 129.  Spring device needle missing, [place]ard Grater will help replace this. Emphasized checking BS 4 times/day.  Continue Glyburide for now.  Ultrasound 9/5 showed 912 g/72%, cervix 3.1 cm. Normal BP today.  No other complaints or concerns.  Fetal movement, blood pressure and labor precautions reviewed.

## 2012-02-10 ENCOUNTER — Ambulatory Visit (INDEPENDENT_AMBULATORY_CARE_PROVIDER_SITE_OTHER): Payer: Self-pay | Admitting: Advanced Practice Midwife

## 2012-02-10 VITALS — BP 110/70 | Temp 97.4°F | Wt 255.1 lb

## 2012-02-10 DIAGNOSIS — O09299 Supervision of pregnancy with other poor reproductive or obstetric history, unspecified trimester: Secondary | ICD-10-CM

## 2012-02-10 DIAGNOSIS — O09219 Supervision of pregnancy with history of pre-term labor, unspecified trimester: Secondary | ICD-10-CM

## 2012-02-10 DIAGNOSIS — R350 Frequency of micturition: Secondary | ICD-10-CM

## 2012-02-10 DIAGNOSIS — O09899 Supervision of other high risk pregnancies, unspecified trimester: Secondary | ICD-10-CM

## 2012-02-10 DIAGNOSIS — O0990 Supervision of high risk pregnancy, unspecified, unspecified trimester: Secondary | ICD-10-CM

## 2012-02-10 LAB — POCT URINALYSIS DIP (DEVICE)
Bilirubin Urine: NEGATIVE
Glucose, UA: NEGATIVE mg/dL
Hgb urine dipstick: NEGATIVE
Ketones, ur: 40 mg/dL — AB
Specific Gravity, Urine: >=1.03 (ref 1.005–1.030)

## 2012-02-10 NOTE — Progress Notes (Signed)
~  5 UC's/hr Saturday night. None since. Denies and pain. Still awaiting records for 26 week delivery. Pt recalls being told that she should not labor in future pregnancies because of C/S. Asked receptionist to call previous provider. Needs growth [place] at 29 weeks. CBGs in range.

## 2012-02-10 NOTE — Progress Notes (Signed)
P = 100 Pain and contractions on Saturday night after walking. Still some abdominal pain today.

## 2012-02-12 DIAGNOSIS — O09899 Supervision of other high risk pregnancies, unspecified trimester: Secondary | ICD-10-CM | POA: Insufficient documentation

## 2012-02-12 DIAGNOSIS — O09299 Supervision of pregnancy with other poor reproductive or obstetric history, unspecified trimester: Secondary | ICD-10-CM | POA: Insufficient documentation

## 2012-02-12 LAB — CULTURE, OB URINE: Colony Count: >=100000

## 2012-02-13 ENCOUNTER — Telehealth: Payer: Self-pay | Admitting: *Deleted

## 2012-02-13 ENCOUNTER — Encounter: Payer: Self-pay | Admitting: Advanced Practice Midwife

## 2012-02-13 DIAGNOSIS — I10 Essential (primary) hypertension: Secondary | ICD-10-CM

## 2012-02-13 DIAGNOSIS — O34219 Maternal care for unspecified type scar from previous cesarean delivery: Secondary | ICD-10-CM

## 2012-02-13 DIAGNOSIS — O99210 Obesity complicating pregnancy, unspecified trimester: Secondary | ICD-10-CM

## 2012-02-13 DIAGNOSIS — O09299 Supervision of pregnancy with other poor reproductive or obstetric history, unspecified trimester: Secondary | ICD-10-CM

## 2012-02-13 DIAGNOSIS — N83209 Unspecified ovarian cyst, unspecified side: Secondary | ICD-10-CM

## 2012-02-13 DIAGNOSIS — O09529 Supervision of elderly multigravida, unspecified trimester: Secondary | ICD-10-CM

## 2012-02-13 DIAGNOSIS — O0990 Supervision of high risk pregnancy, unspecified, unspecified trimester: Secondary | ICD-10-CM

## 2012-02-13 DIAGNOSIS — O10019 Pre-existing essential hypertension complicating pregnancy, unspecified trimester: Secondary | ICD-10-CM

## 2012-02-13 DIAGNOSIS — A6009 Herpesviral infection of other urogenital tract: Secondary | ICD-10-CM

## 2012-02-13 NOTE — Telephone Encounter (Signed)
Patient called back again about the message I had given her earlier from [place] informing her that she had a LTCS with her 2008 delivery and we would schedule her delivery for 39 weeks. Pt called back stating that this is not what her doctor told her before (the one that did the csection) she says they told her she would need to be delivered early because of the way they cut her uterus. I suggested that she discuss this at her next visit so that all of her concerns could be addressed. She became upset and stated that she is the patient and she has a right to know whats going on. She asked if I had the correct delivery note, so I pulled up the file and confirmed that the note was from 2008 and that a low transverse csection was performed, also informed her that nothing was documented stating that she would need to deliver early. Pt then stated she would call the doctor that performed the csection and ask them. She then hung up on me.

## 2012-02-13 NOTE — Telephone Encounter (Signed)
Message copied by Mannie Stabile on Thu Feb 13, 2012  9:09 AM ------      Message from: [place], [place]      Created: Thu Feb 13, 2012  1:47 AM       Please informed patient that we received the records for 2008 delivery. The surgeon was able to perform a low transverse C-section. We'll plan delivery at 39 weeks with current pregnancy.

## 2012-02-13 NOTE — Telephone Encounter (Signed)
Pt informed

## 2012-02-24 ENCOUNTER — Ambulatory Visit (INDEPENDENT_AMBULATORY_CARE_PROVIDER_SITE_OTHER): Payer: Self-pay | Admitting: Family Medicine

## 2012-02-24 VITALS — BP 131/88 | Temp 96.7°F | Wt 258.6 lb

## 2012-02-24 DIAGNOSIS — O0990 Supervision of high risk pregnancy, unspecified, unspecified trimester: Secondary | ICD-10-CM

## 2012-02-24 DIAGNOSIS — I10 Essential (primary) hypertension: Secondary | ICD-10-CM

## 2012-02-24 DIAGNOSIS — L293 Anogenital pruritus, unspecified: Secondary | ICD-10-CM

## 2012-02-24 DIAGNOSIS — O10019 Pre-existing essential hypertension complicating pregnancy, unspecified trimester: Secondary | ICD-10-CM

## 2012-02-24 DIAGNOSIS — O34219 Maternal care for unspecified type scar from previous cesarean delivery: Secondary | ICD-10-CM

## 2012-02-24 DIAGNOSIS — N898 Other specified noninflammatory disorders of vagina: Secondary | ICD-10-CM

## 2012-02-24 DIAGNOSIS — O09529 Supervision of elderly multigravida, unspecified trimester: Secondary | ICD-10-CM

## 2012-02-24 DIAGNOSIS — Z23 Encounter for immunization: Secondary | ICD-10-CM

## 2012-02-24 DIAGNOSIS — O09219 Supervision of pregnancy with history of pre-term labor, unspecified trimester: Secondary | ICD-10-CM

## 2012-02-24 LAB — RPR

## 2012-02-24 LAB — POCT URINALYSIS DIP (DEVICE)
Nitrite: NEGATIVE
Specific Gravity, Urine: 1.015 (ref 1.005–1.030)
Urobilinogen, UA: 0.2 mg/dL (ref 0.0–1.0)
pH: 6.5 (ref 5.0–8.0)

## 2012-02-24 LAB — CBC
Hemoglobin: 11.1 g/dL — ABNORMAL LOW (ref 12.0–15.0)
MCH: 22.2 pg — ABNORMAL LOW (ref 26.0–34.0)
MCV: 68.6 fL — ABNORMAL LOW (ref 78.0–100.0)
RBC: 5 MIL/uL (ref 3.87–5.11)
WBC: 7.2 10*3/uL (ref 4.0–10.5)

## 2012-02-24 MED ORDER — INFLUENZA VIRUS VACC SPLIT PF IM SUSP
0.5000 mL | Freq: Once | INTRAMUSCULAR | Status: AC
  Administered 2012-02-24: 0.5 mL via INTRAMUSCULAR

## 2012-02-24 NOTE — Progress Notes (Signed)
U/S scheduled Oct. 3, 2013 at 945 am.

## 2012-02-24 NOTE — Progress Notes (Signed)
P - 87 Mild edema in feet; Occasional pain in upper abdomen with activity C/o vaginal irritation w/o discharge

## 2012-02-24 NOTE — Progress Notes (Signed)
Nutrition note: f/u visit Pt has GDM. Pt has gained 38.6# @ [redacted]w[redacted]d, which is > expected. Pt reports eating 3 meals & 3 snacks/d. Pt drinks water, milk & diet V-8 splash. Pt is taking PNV & glyburide. Pt did not bring log but stated fasting BS have been 70-85 & 2hr pp have been <120. Pt reports no N&V but has heartburn occ. Pt reports walking 3-4x/ wk. Reviewed GDM diet & encouraged pt to bring log to every appt.  Reviewed wt gain goals of 11-20# or 0.5#/wk. Pt reports no ? at this time. F/u in 4-6 wks Blondell Reveal, MS, RD, LDN

## 2012-02-24 NOTE — Patient Instructions (Signed)
Use over the counter Monistat cream for vaginal itching. We will call with results of testing if you need further treatment.  Pregnancy - Third Trimester The third trimester of pregnancy (the last 3 months) is a period of the most rapid growth for you and your baby. The baby approaches a length of 20 inches and a weight of 6 to 10 pounds. The baby is adding on fat and getting ready for life outside your body. While inside, babies have periods of sleeping and waking, suck their thumbs, and hiccups. You can often feel small contractions of the uterus. This is false labor. It is also called Braxton-Hicks contractions. This is like a practice for labor. The usual problems in this stage of pregnancy include more difficulty breathing, swelling of the hands and feet from water retention, and having to urinate more often because of the uterus and baby pressing on your bladder.  PRENATAL EXAMS  Blood work may continue to be done during prenatal exams. These tests are done to check on your health and the probable health of your baby. Blood work is used to follow your blood levels (hemoglobin). Anemia (low hemoglobin) is common during pregnancy. Iron and vitamins are given to help prevent this. You may also continue to be checked for diabetes. Some of the past blood tests may be done again.   The size of the uterus is measured during each visit. This makes sure your baby is growing properly according to your pregnancy dates.   Your blood pressure is checked every prenatal visit. This is to make sure you are not getting toxemia.   Your urine is checked every prenatal visit for infection, diabetes and protein.   Your weight is checked at each visit. This is done to make sure gains are happening at the suggested rate and that you and your baby are growing normally.   Sometimes, an ultrasound is performed to confirm the position and the proper growth and development of the baby. This is a test done that bounces  harmless sound waves off the baby so your caregiver can more accurately determine due dates.   Discuss the type of pain medication and anesthesia you will have during your labor and delivery.   Discuss the possibility and anesthesia if a Cesarean Section might be necessary.   Inform your caregiver if there is any mental or physical violence at home.  Sometimes, a specialized non-stress test, contraction stress test and biophysical profile are done to make sure the baby is not having a problem. Checking the amniotic fluid surrounding the baby is called an amniocentesis. The amniotic fluid is removed by sticking a needle into the belly (abdomen). This is sometimes done near the end of pregnancy if an early delivery is required. In this case, it is done to help make sure the baby's lungs are mature enough for the baby to live outside of the womb. If the lungs are not mature and it is unsafe to deliver the baby, an injection of cortisone medication is given to the mother 1 to 2 days before the delivery. This helps the baby's lungs mature and makes it safer to deliver the baby. CHANGES OCCURING IN THE THIRD TRIMESTER OF PREGNANCY Your body goes through many changes during pregnancy. They vary from person to person. Talk to your caregiver about changes you notice and are concerned about.  During the last trimester, you have probably had an increase in your appetite. It is normal to have cravings for certain foods.  This varies from person to person and pregnancy to pregnancy.   You may begin to get stretch marks on your hips, abdomen, and breasts. These are normal changes in the body during pregnancy. There are no exercises or medications to take which prevent this change.   Constipation may be treated with a stool softener or adding bulk to your diet. Drinking lots of fluids, fiber in vegetables, fruits, and whole grains are helpful.   Exercising is also helpful. If you have been very active up until your  pregnancy, most of these activities can be continued during your pregnancy. If you have been less active, it is helpful to start an exercise program such as walking. Consult your caregiver before starting exercise programs.   Avoid all smoking, alcohol, un-prescribed drugs, herbs and "street drugs" during your pregnancy. These chemicals affect the formation and growth of the baby. Avoid chemicals throughout the pregnancy to ensure the delivery of a healthy infant.   Backache, varicose veins and hemorrhoids may develop or get worse.   You will tire more easily in the third trimester, which is normal.   The baby's movements may be stronger and more often.   You may become short of breath easily.   Your belly button may stick out.   A yellow discharge may leak from your breasts called colostrum.   You may have a bloody mucus discharge. This usually occurs a few days to a week before labor begins.  HOME CARE INSTRUCTIONS   Keep your caregiver's appointments. Follow your caregiver's instructions regarding medication use, exercise, and diet.   During pregnancy, you are providing food for you and your baby. Continue to eat regular, well-balanced meals. Choose foods such as meat, fish, milk and other low fat dairy products, vegetables, fruits, and whole-grain breads and cereals. Your caregiver will tell you of the ideal weight gain.   A physical sexual relationship may be continued throughout pregnancy if there are no other problems such as early (premature) leaking of amniotic fluid from the membranes, vaginal bleeding, or belly (abdominal) pain.   Exercise regularly if there are no restrictions. Check with your caregiver if you are unsure of the safety of your exercises. Greater weight gain will occur in the last 2 trimesters of pregnancy. Exercising helps:   Control your weight.   Get you in shape for labor and delivery.   You lose weight after you deliver.   Rest a lot with legs  elevated, or as needed for leg cramps or low back pain.   Wear a good support or jogging bra for breast tenderness during pregnancy. This may help if worn during sleep. Pads or tissues may be used in the bra if you are leaking colostrum.   Do not use hot tubs, steam rooms, or saunas.   Wear your seat belt when driving. This protects you and your baby if you are in an accident.   Avoid raw meat, cat litter boxes and soil used by cats. These carry germs that can cause birth defects in the baby.   It is easier to loose urine during pregnancy. Tightening up and strengthening the pelvic muscles will help with this problem. You can practice stopping your urination while you are going to the bathroom. These are the same muscles you need to strengthen. It is also the muscles you would use if you were trying to stop from passing gas. You can practice tightening these muscles up 10 times a set and repeating this about 3  times per day. Once you know what muscles to tighten up, do not perform these exercises during urination. It is more likely to cause an infection by backing up the urine.   Ask for help if you have financial, counseling or nutritional needs during pregnancy. Your caregiver will be able to offer counseling for these needs as well as refer you for other special needs.   Make a list of emergency phone numbers and have them available.   Plan on getting help from family or friends when you go home from the hospital.   Make a trial run to the hospital.   Take prenatal classes with the father to understand, practice and ask questions about the labor and delivery.   Prepare the baby's room/nursery.   Do not travel out of the city unless it is absolutely necessary and with the advice of your caregiver.   Wear only low or no heal shoes to have better balance and prevent falling.  MEDICATIONS AND DRUG USE IN PREGNANCY  Take prenatal vitamins as directed. The vitamin should contain 1 milligram  of folic acid. Keep all vitamins out of reach of children. Only a couple vitamins or tablets containing iron may be fatal to a baby or young child when ingested.   Avoid use of all medications, including herbs, over-the-counter medications, not prescribed or suggested by your caregiver. Only take over-the-counter or prescription medicines for pain, discomfort, or fever as directed by your caregiver. Do not use aspirin, ibuprofen (Motrin, Advil, Nuprin) or naproxen (Aleve) unless OK'd by your caregiver.   Let your caregiver also know about herbs you may be using.   Alcohol is related to a number of birth defects. This includes fetal alcohol syndrome. All alcohol, in any form, should be avoided completely. Smoking will cause low birth rate and premature babies.   Street/illegal drugs are very harmful to the baby. They are absolutely forbidden. A baby born to an addicted mother will be addicted at birth. The baby will go through the same withdrawal an adult does.  SEEK MEDICAL CARE IF: You have any concerns or worries during your pregnancy. It is better to call with your questions if you feel they cannot wait, rather than worry about them. DECISIONS ABOUT CIRCUMCISION You may or may not know the sex of your baby. If you know your baby is a boy, it may be time to think about circumcision. Circumcision is the removal of the foreskin of the penis. This is the skin that covers the sensitive end of the penis. There is no proven medical need for this. Often this decision is made on what is popular at the time or based upon religious beliefs and social issues. You can discuss these issues with your caregiver or pediatrician. SEEK IMMEDIATE MEDICAL CARE IF:   An unexplained oral temperature above 102 F (38.9 C) develops, or as your caregiver suggests.   You have leaking of fluid from the vagina (birth canal). If leaking membranes are suspected, take your temperature and tell your caregiver of this when  you call.   There is vaginal spotting, bleeding or passing clots. Tell your caregiver of the amount and how many pads are used.   You develop a bad smelling vaginal discharge with a change in the color from clear to white.   You develop vomiting that lasts more than 24 hours.   You develop chills or fever.   You develop shortness of breath.   You develop burning on urination.  You loose more than 2 pounds of weight or gain more than 2 pounds of weight or as suggested by your caregiver.   You notice sudden swelling of your face, hands, and feet or legs.   You develop belly (abdominal) pain. Round ligament discomfort is a common non-cancerous (benign) cause of abdominal pain in pregnancy. Your caregiver still must evaluate you.   You develop a severe headache that does not go away.   You develop visual problems, blurred or double vision.   If you have not felt your baby move for more than 1 hour. If you think the baby is not moving as much as usual, eat something with sugar in it and lie down on your left side for an hour. The baby should move at least 4 to 5 times per hour. Call right away if your baby moves less than that.   You fall, are in a car accident or any kind of trauma.   There is mental or physical violence at home.  Document Released: 05/07/2001 Document Revised: 05/02/2011 Document Reviewed: 11/09/2008 [facility] Patient Information 2012 [place], [place].  Gestational Diabetes Mellitus Gestational diabetes mellitus (GDM) is diabetes that occurs only during pregnancy. This happens when the body cannot properly handle the glucose (sugar) that increases in the blood after eating. During pregnancy, insulin resistance (reduced sensitivity to insulin) occurs because of the release of hormones from the placenta. Usually, the pancreas of pregnant women produces enough insulin to overcome the resistance that occurs. However, in gestational diabetes, the insulin is there but it does  not work effectively. If the resistance is severe enough that the pancreas does not produce enough insulin, extra glucose builds up in the blood.  WHO IS AT RISK FOR DEVELOPING GESTATIONAL DIABETES?  Women with a history of diabetes in the family.   Women over age 39.   Women who are overweight.   Women in certain ethnic groups (Hispanic, African American, Native American, [place] and [place] Islander).  WHAT CAN HAPPEN TO THE BABY? If the mother's blood glucose is too high while she is pregnant, the extra sugar will travel through the umbilical cord to the baby. Some of the problems the baby may have are:  Large Baby - If the baby receives too much sugar, the baby will gain more weight. This may cause the baby to be too large to be born normally (vaginally) and a Cesarean section (C-section) may be needed.   Low Blood Glucose (hypoglycemia) - The baby makes extra insulin, in response to the extra sugar its gets from its mother. When the baby is born and no longer needs this extra insulin, the baby's blood glucose level may drop.   Jaundice (yellow coloring of the skin and eyes) - This is fairly common in babies. It is caused from a build-up of the chemical called bilirubin. This is rarely serious, but is seen more often in babies whose mothers had gestational diabetes.  RISKS TO THE MOTHER Women who have had gestational diabetes may be at higher risk for some problems, including:  Preeclampsia or toxemia, which includes problems with high blood pressure. Blood pressure and protein levels in the urine must be checked frequently.   Infections.   Cesarean section (C-section) for delivery.   Developing Type 2 diabetes later in life. About 30-50% will develop diabetes later, especially if obese.  DIAGNOSIS  The hormones that cause insulin resistance are highest at about 24-28 weeks of pregnancy. If symptoms are experienced, they are much like  symptoms you would normally expect during  pregnancy.  GDM is often diagnosed using a two part method: 1. After 24-28 weeks of pregnancy, the woman drinks a glucose solution and takes a blood test. If the glucose level is high, a second test will be given.  2. Oral Glucose Tolerance Test (OGTT) which is 3 hours long - After not eating overnight, the blood glucose is checked. The woman drinks a glucose solution, and hourly blood glucose tests are taken.  If the woman has risk factors for GDM, the caregiver may test earlier than 24 weeks of pregnancy. TREATMENT  Treatment of GDM is directed at keeping the mother's blood glucose level normal, and may include:  Meal planning.   Taking insulin or other medicine to control your blood glucose level.   Exercise.   Keeping a daily record of the foods you eat.   Blood glucose monitoring and keeping a record of your blood glucose levels.   May monitor ketone levels in the urine, although this is no longer considered necessary in most pregnancies.  HOME CARE INSTRUCTIONS  While you are pregnant:  Follow your caregiver's advice regarding your prenatal appointments, meal planning, exercise, medicines, vitamins, blood and other tests, and physical activities.   Keep a record of your meals, blood glucose tests, and the amount of insulin you are taking (if any). Show this to your caregiver at every prenatal visit.   If you have GDM, you may have problems with hypoglycemia (low blood glucose). You may suspect this if you become suddenly dizzy, feel shaky, and/or weak. If you think this is happening and you have a glucose meter, try to test your blood glucose level. Follow your caregiver's advice for when and how to treat your low blood glucose. Generally, the 15:15 rule is followed: Treat by consuming 15 grams of carbohydrates, wait 15 minutes, and recheck blood glucose. Examples of 15 grams of carbohydrates are:   1 cup skim or low-fat milk.    cup juice.   3-4 glucose tablets.   5-6 hard  candies.   1 small box raisins.    cup regular soda pop.   Practice good hygiene, to avoid infections.   Do not smoke.  SEEK MEDICAL CARE IF:   You develop abnormal vaginal discharge, with or without itching.   You become weak and tired more than expected.   You seem to sweat a lot.   You have a sudden increase in weight, 5 pounds or more in one week.   You are losing weight, 3 pounds or more in a week.   Your blood glucose level is high, and you need instructions on what to do about it.  SEEK IMMEDIATE MEDICAL CARE IF:   You develop a severe headache.   You faint or pass out.   You develop nausea and vomiting.   You become disoriented or confused.   You have a convulsion.   You develop vision problems.   You develop stomach pain.   You develop vaginal bleeding.   You develop uterine contractions.   You have leaking or a gush of fluid from the vagina.  AFTER YOU HAVE THE BABY:  Go to all of your follow-up appointments, and have blood tests as advised by your caregiver.   Maintain a healthy lifestyle, to prevent diabetes in the future. This includes:   Following a healthy meal plan.   Controlling your weight.   Getting enough exercise and proper rest.   Do not smoke.  Breastfeed your baby if you can. This will lower the chance of you and your baby developing diabetes later in life.  For more information about diabetes, go to the American Diabetes Association at: PMFashions.com.cy. For more information about gestational diabetes, go to the [employer] at: RentRule.com.au. Document Released: 08/19/2000 Document Revised: 05/02/2011 Document Reviewed: 03/13/2009 [facility] [address] Patient Information 2012 [place], [place].

## 2012-02-24 NOTE — Progress Notes (Signed)
Pt did not bring log or glucometer. States her fasting sugars all in 70s and 80s. PP all under 120. On 2.5 mg glyburide once daily (not twice). Continue same regimen and bring sugars and glucometer next visit.  Hypertension:  Ald[place] 500 bid. BP controlled. Sono 9/5 showed growth in 72%, subjectively low AFI. WIll repeat next week (4 weeks from previous). Vaginal itching, mostly external - wet prep done. Pt worried about waiting until 39 weeks for delivery because states she had a "second" incision on her uterus when twins delivered. Reviewed operative report and clearly states lower uterine segment transverse incision. However, pt states the OB's assistant in the OR told her that with her next pregnancy she should inform her doctor that she had a second uterine incision and should be delivered by 36 or 37 weeks. Will attempt to get more information from [facility].

## 2012-02-25 ENCOUNTER — Telehealth: Payer: Self-pay | Admitting: *Deleted

## 2012-02-25 MED ORDER — FLUCONAZOLE 150 MG PO TABS: 150.0000 mg | ORAL_TABLET | Freq: Once | ORAL | Status: DC

## 2012-02-25 MED ORDER — METRONIDAZOLE 500 MG PO TABS: 500.0000 mg | ORAL_TABLET | Freq: Two times a day (BID) | ORAL | Status: DC

## 2012-02-25 NOTE — Telephone Encounter (Signed)
Called pt and informed her of test results and medication treatment has been prescribed. She may pick up her medications later today.  Pt voiced understanding.

## 2012-02-25 NOTE — Telephone Encounter (Signed)
Message copied by Jill Side on Tue Feb 25, 2012 12:01 PM ------      Message from: [place], [place]      Created: Tue Feb 25, 2012 11:49 AM       Pt has BV and yeast. Treat with PO diflucan 150 mg x 1.  BV treat with metronidazole 500 mg PO BID for 7 days (pt not to drink alcohol during treatment).  Thanks.

## 2012-02-27 ENCOUNTER — Ambulatory Visit (HOSPITAL_COMMUNITY): Payer: Self-pay

## 2012-03-02 ENCOUNTER — Ambulatory Visit (HOSPITAL_COMMUNITY)
Admission: RE | Admit: 2012-03-02 | Discharge: 2012-03-02 | Disposition: A | Discharge status: Home / Self Care | Payer: Self-pay | Source: Ambulatory Visit | Attending: Family Medicine | Admitting: Family Medicine

## 2012-03-02 ENCOUNTER — Encounter (HOSPITAL_COMMUNITY): Payer: Self-pay

## 2012-03-02 DIAGNOSIS — O10019 Pre-existing essential hypertension complicating pregnancy, unspecified trimester: Secondary | ICD-10-CM | POA: Insufficient documentation

## 2012-03-02 DIAGNOSIS — O98519 Other viral diseases complicating pregnancy, unspecified trimester: Secondary | ICD-10-CM | POA: Insufficient documentation

## 2012-03-02 DIAGNOSIS — O34219 Maternal care for unspecified type scar from previous cesarean delivery: Secondary | ICD-10-CM | POA: Insufficient documentation

## 2012-03-02 DIAGNOSIS — O09529 Supervision of elderly multigravida, unspecified trimester: Secondary | ICD-10-CM | POA: Insufficient documentation

## 2012-03-02 DIAGNOSIS — A6000 Herpesviral infection of urogenital system, unspecified: Secondary | ICD-10-CM | POA: Insufficient documentation

## 2012-03-02 DIAGNOSIS — O09299 Supervision of pregnancy with other poor reproductive or obstetric history, unspecified trimester: Secondary | ICD-10-CM | POA: Insufficient documentation

## 2012-03-02 DIAGNOSIS — O99810 Abnormal glucose complicating pregnancy: Secondary | ICD-10-CM | POA: Insufficient documentation

## 2012-03-02 DIAGNOSIS — Z8751 Personal history of pre-term labor: Secondary | ICD-10-CM | POA: Insufficient documentation

## 2012-03-02 DIAGNOSIS — O0990 Supervision of high risk pregnancy, unspecified, unspecified trimester: Secondary | ICD-10-CM

## 2012-03-09 ENCOUNTER — Encounter: Payer: Self-pay | Admitting: Obstetrics & Gynecology

## 2012-03-09 ENCOUNTER — Ambulatory Visit (INDEPENDENT_AMBULATORY_CARE_PROVIDER_SITE_OTHER): Payer: Self-pay | Admitting: Obstetrics & Gynecology

## 2012-03-09 VITALS — BP 124/77 | Temp 97.5°F | Wt 259.0 lb

## 2012-03-09 DIAGNOSIS — O09899 Supervision of other high risk pregnancies, unspecified trimester: Secondary | ICD-10-CM

## 2012-03-09 DIAGNOSIS — O09219 Supervision of pregnancy with history of pre-term labor, unspecified trimester: Secondary | ICD-10-CM

## 2012-03-09 LAB — POCT URINALYSIS DIP (DEVICE)
Hgb urine dipstick: NEGATIVE
Nitrite: NEGATIVE
Protein, ur: NEGATIVE mg/dL
Specific Gravity, Urine: 1.025 (ref 1.005–1.030)
Urobilinogen, UA: 0.2 mg/dL (ref 0.0–1.0)

## 2012-03-09 NOTE — Progress Notes (Signed)
Fastings 81-102, most nml; 2 hr lunch 96-169, 2 hr lunch 59-132; 2 hh dinner 89-131.   Pt still thinks she had a classical c/s with her 2nd pregnancy.  Reviewed op notes again.  Op note is very detailed and discussed uterine incision being 1 mc from broad ligament.  Discharge summary does not mention otherwise.  Pt reassured that all documentation states that she had LT c/s.  We do not have her first op note and requesting info now.  Pt obese so fundal height is difficult.  Pt to get growth q 3 weeks.  Will start 2x week testing in 10 days.  Pt to see MD in 10 days.  Preeclampsia and fetal kick counts reviewed. Reset due date to 112/17/13.  Pt is sure of LMP and corresponds to 6 wk [place] within 3 days.  Pt aware of new EDC.

## 2012-03-09 NOTE — Patient Instructions (Signed)
Fetal Movement Counts Patient Name: __________________________________________________ Patient Due Date: ____________________ Kick counts is highly recommended in high risk pregnancies, but it is a good idea for every pregnant woman to do. Start counting fetal movements at 28 weeks of the pregnancy. Fetal movements increase after eating a full meal or eating or drinking something sweet (the blood sugar is higher). It is also important to drink plenty of fluids (well hydrated) before doing the count. Lie on your left side because it helps with the circulation or you can sit in a comfortable chair with your arms over your belly (abdomen) with no distractions around you. DOING THE COUNT  Try to do the count the same time of day each time you do it.  Mark the day and time, then see how long it takes for you to feel 10 movements (kicks, flutters, swishes, rolls). You should have at least 10 movements within 2 hours. You will most likely feel 10 movements in much less than 2 hours. If you do not, wait an hour and count again. After a couple of days you will see a pattern.  What you are looking for is a change in the pattern or not enough counts in 2 hours. Is it taking longer in time to reach 10 movements? SEEK MEDICAL CARE IF:  You feel less than 10 counts in 2 hours. Tried twice.  No movement in one hour.  The pattern is changing or taking longer each day to reach 10 counts in 2 hours.  You feel the baby is not moving as it usually does. Date: ____________ Movements: ____________ Start time: ____________ Finish time: ____________  Date: ____________ Movements: ____________ Start time: ____________ Finish time: ____________ Date: ____________ Movements: ____________ Start time: ____________ Finish time: ____________ Date: ____________ Movements: ____________ Start time: ____________ Finish time: ____________ Date: ____________ Movements: ____________ Start time: ____________ Finish time:  ____________ Date: ____________ Movements: ____________ Start time: ____________ Finish time: ____________ Date: ____________ Movements: ____________ Start time: ____________ Finish time: ____________ Date: ____________ Movements: ____________ Start time: ____________ Finish time: ____________  Date: ____________ Movements: ____________ Start time: ____________ Finish time: ____________ Date: ____________ Movements: ____________ Start time: ____________ Finish time: ____________ Date: ____________ Movements: ____________ Start time: ____________ Finish time: ____________ Date: ____________ Movements: ____________ Start time: ____________ Finish time: ____________ Date: ____________ Movements: ____________ Start time: ____________ Finish time: ____________ Date: ____________ Movements: ____________ Start time: ____________ Finish time: ____________ Date: ____________ Movements: ____________ Start time: ____________ Finish time: ____________  Date: ____________ Movements: ____________ Start time: ____________ Finish time: ____________ Date: ____________ Movements: ____________ Start time: ____________ Finish time: ____________ Date: ____________ Movements: ____________ Start time: ____________ Finish time: ____________ Date: ____________ Movements: ____________ Start time: ____________ Finish time: ____________ Date: ____________ Movements: ____________ Start time: ____________ Finish time: ____________ Date: ____________ Movements: ____________ Start time: ____________ Finish time: ____________ Date: ____________ Movements: ____________ Start time: ____________ Finish time: ____________  Date: ____________ Movements: ____________ Start time: ____________ Finish time: ____________ Date: ____________ Movements: ____________ Start time: ____________ Finish time: ____________ Date: ____________ Movements: ____________ Start time: ____________ Finish time: ____________ Date: ____________ Movements:  ____________ Start time: ____________ Finish time: ____________ Date: ____________ Movements: ____________ Start time: ____________ Finish time: ____________ Date: ____________ Movements: ____________ Start time: ____________ Finish time: ____________ Date: ____________ Movements: ____________ Start time: ____________ Finish time: ____________  Date: ____________ Movements: ____________ Start time: ____________ Finish time: ____________ Date: ____________ Movements: ____________ Start time: ____________ Finish time: ____________ Date: ____________ Movements: ____________ Start time: ____________ Finish time: ____________ Date: ____________ Movements:   ____________ Start time: ____________ Finish time: ____________ Date: ____________ Movements: ____________ Start time: ____________ Finish time: ____________ Date: ____________ Movements: ____________ Start time: ____________ Finish time: ____________ Date: ____________ Movements: ____________ Start time: ____________ Finish time: ____________  Date: ____________ Movements: ____________ Start time: ____________ Finish time: ____________ Date: ____________ Movements: ____________ Start time: ____________ Finish time: ____________ Date: ____________ Movements: ____________ Start time: ____________ Finish time: ____________ Date: ____________ Movements: ____________ Start time: ____________ Finish time: ____________ Date: ____________ Movements: ____________ Start time: ____________ Finish time: ____________ Date: ____________ Movements: ____________ Start time: ____________ Finish time: ____________ Date: ____________ Movements: ____________ Start time: ____________ Finish time: ____________  Date: ____________ Movements: ____________ Start time: ____________ Finish time: ____________ Date: ____________ Movements: ____________ Start time: ____________ Finish time: ____________ Date: ____________ Movements: ____________ Start time: ____________ Finish  time: ____________ Date: ____________ Movements: ____________ Start time: ____________ Finish time: ____________ Date: ____________ Movements: ____________ Start time: ____________ Finish time: ____________ Date: ____________ Movements: ____________ Start time: ____________ Finish time: ____________ Date: ____________ Movements: ____________ Start time: ____________ Finish time: ____________  Date: ____________ Movements: ____________ Start time: ____________ Finish time: ____________ Date: ____________ Movements: ____________ Start time: ____________ Finish time: ____________ Date: ____________ Movements: ____________ Start time: ____________ Finish time: ____________ Date: ____________ Movements: ____________ Start time: ____________ Finish time: ____________ Date: ____________ Movements: ____________ Start time: ____________ Finish time: ____________ Date: ____________ Movements: ____________ Start time: ____________ Finish time: ____________ Document Released: 06/12/2006 Document Revised: 08/05/2011 Document Reviewed: 12/13/2008 [employer] Patient Information 2013 [employer]  

## 2012-03-09 NOTE — Progress Notes (Signed)
P = 104 Pain and pressure in the lower abdomen Trace edema in hands and feet

## 2012-03-09 NOTE — Progress Notes (Signed)
U/S scheduled 03/23/12 at 915 am.

## 2012-03-11 ENCOUNTER — Other Ambulatory Visit: Payer: Self-pay | Admitting: Obstetrics & Gynecology

## 2012-03-12 ENCOUNTER — Inpatient Hospital Stay (HOSPITAL_COMMUNITY)
Admission: AD | Admit: 2012-03-12 | Discharge: 2012-03-12 | Disposition: A | Discharge status: Home / Self Care | Payer: Self-pay | Source: Ambulatory Visit | Attending: Family Medicine | Admitting: Family Medicine

## 2012-03-12 ENCOUNTER — Encounter: Payer: Self-pay | Admitting: *Deleted

## 2012-03-12 ENCOUNTER — Inpatient Hospital Stay (HOSPITAL_COMMUNITY): Payer: Self-pay

## 2012-03-12 ENCOUNTER — Encounter (HOSPITAL_COMMUNITY): Payer: Self-pay | Admitting: *Deleted

## 2012-03-12 DIAGNOSIS — O368190 Decreased fetal movements, unspecified trimester, not applicable or unspecified: Secondary | ICD-10-CM | POA: Insufficient documentation

## 2012-03-12 LAB — GLUCOSE, CAPILLARY: Glucose-Capillary: 113 mg/dL — ABNORMAL HIGH (ref 70–99)

## 2012-03-12 NOTE — MAU Note (Signed)
States baby not moving as well today.

## 2012-03-12 NOTE — MAU Provider Note (Signed)
History     CSN: [id number]  Arrival date and time: 03/12/12 1845   None     Chief Complaint  Patient presents with  . Decreased Fetal Movement   HPI Pt is a 36 y.o. [id number] at [redacted]w[redacted]d with decreased fetal movement today.  Did kick counts, tried rubbing and jiggling stomach but could not feel baby move. Once arrived in MAU, baby moving fine. Pt is diabetic on glyburide. States sugars are well controlled (80s fasting, 120-130 pp) but has craving for sweets and indulges once or twice a week. Denies contractions, bleeding, loss of fluid, dizziness, vision changes or headache.  OB History    Grav Para Term Preterm Abortions TAB SAB Ect Mult Living   4 2  2  0 0 0 0 2 3      Past Medical History  Diagnosis Date  . Herpes genitalia     2013 - 1st outbreak  . Trichomonas   . Twins   . Hypertension     2008  . Gestational diabetes     Neg 2 hr test after preg    Past Surgical History  Procedure Date  . Cesarean section     X 2    Family History  Problem Relation Age of Onset  . Hypertension Mother   . Stroke Maternal Grandmother   . Heart disease Maternal Grandfather     History  Substance Use Topics  . Smoking status: Former Smoker -- 0.5 packs/day    Types: Cigarettes    Quit date: 10/11/2011  . Smokeless tobacco: Never Used  . Alcohol Use: No    Allergies:  Allergies  Allergen Reactions  . Penicillins Swelling    Prescriptions prior to admission  Medication Sig Dispense Refill  . acyclovir (ZOVIRAX) 200 MG capsule Take 200 mg by mouth 3 (three) times daily.      Marland Kitchen glyBURIDE (DIABETA) 2.5 MG tablet Take 1 tablet (2.5 mg total) by mouth 2 (two) times daily with a meal.  60 tablet  3  . methyldopa (ALDOMET) 500 MG tablet TAKE 1 TABLET (500 MG TOTAL) BY MOUTH 2 (TWO) TIMES DAILY.  60 tablet  3  . Prenatal Vit-Fe Fumarate-FA (PRENATAL MULTIVITAMIN) TABS Take 1 tablet by mouth daily.        Review of Systems  Constitutional: Negative for fever and chills.    Eyes: Negative for blurred vision and double vision.  Respiratory: Negative for shortness of breath.   Cardiovascular: Negative for chest pain.  Gastrointestinal: Negative for nausea, vomiting, abdominal pain, diarrhea and constipation.  Genitourinary: Negative for dysuria.  Neurological: Negative for dizziness and headaches.   Physical Exam   Blood pressure 138/80, pulse 91, temperature 97.8 F (36.6 C), temperature source Oral, resp. rate 18, height 5\' 7"  (1.702 m), weight 120.203 kg (265 lb), last menstrual period 08/06/2011.  Physical Exam  Constitutional: She is oriented to person, place, and time. She appears well-developed and well-nourished. No distress.  HENT:  Head: Normocephalic and atraumatic.  Eyes: Conjunctivae normal and EOM are normal.  Neck: Normal range of motion. Neck supple.  Cardiovascular: Normal rate and regular rhythm.   Respiratory: Effort normal. No respiratory distress.  GI: Soft. There is no tenderness. There is no rebound and no guarding.  Musculoskeletal: Normal range of motion. She exhibits no edema and no tenderness.  Neurological: She is alert and oriented to person, place, and time.  Skin: Skin is warm and dry.  Psychiatric: She has a normal mood and affect.  NST:  Reactive Basline 135 Variability:  Moderate Accels:  Present Decels:  Occasional variable   MAU Course  Procedures  BPP:  8/8  Assessment and Plan  36 y.o. [id number] at [redacted]w[redacted]d with decreased fetal movement. Reactive NST, BPP 8/8 F/U in clinic as scheduled.   Napoleon Form 03/12/2012, 8:54 PM

## 2012-03-16 ENCOUNTER — Ambulatory Visit (INDEPENDENT_AMBULATORY_CARE_PROVIDER_SITE_OTHER): Payer: Self-pay | Admitting: Advanced Practice Midwife

## 2012-03-16 VITALS — BP 146/91 | Temp 97.4°F | Wt 263.9 lb

## 2012-03-16 DIAGNOSIS — Z8759 Personal history of other complications of pregnancy, childbirth and the puerperium: Secondary | ICD-10-CM

## 2012-03-16 DIAGNOSIS — O0990 Supervision of high risk pregnancy, unspecified, unspecified trimester: Secondary | ICD-10-CM

## 2012-03-16 DIAGNOSIS — O368190 Decreased fetal movements, unspecified trimester, not applicable or unspecified: Secondary | ICD-10-CM

## 2012-03-16 DIAGNOSIS — O09299 Supervision of pregnancy with other poor reproductive or obstetric history, unspecified trimester: Secondary | ICD-10-CM

## 2012-03-16 DIAGNOSIS — O10019 Pre-existing essential hypertension complicating pregnancy, unspecified trimester: Secondary | ICD-10-CM

## 2012-03-16 LAB — POCT URINALYSIS DIP (DEVICE)
Bilirubin Urine: NEGATIVE
Glucose, UA: NEGATIVE mg/dL
Ketones, ur: NEGATIVE mg/dL
Leukocytes, UA: NEGATIVE
Nitrite: NEGATIVE

## 2012-03-16 MED ORDER — METHYLDOPA 500 MG PO TABS: 500.0000 mg | ORAL_TABLET | Freq: Two times a day (BID) | ORAL | Status: DC

## 2012-03-16 NOTE — Progress Notes (Signed)
Pulse: 102 Went to hospital over the weekend because baby was moving less. Everything was okay. Is feeling movement now still is a little less than before.  Needs a refill on [place], is currently out.

## 2012-03-16 NOTE — Patient Instructions (Signed)
Pregnancy - Third Trimester  The third trimester of pregnancy (the last 3 months) is a period of the most rapid growth for you and your baby. The baby approaches a length of 20 inches and a weight of 6 to 10 pounds. The baby is adding on fat and getting ready for life outside your body. While inside, babies have periods of sleeping and waking, suck their thumbs, and hiccups. You can often feel small contractions of the uterus. This is false labor. It is also called Braxton-Hicks contractions. This is like a practice for labor. The usual problems in this stage of pregnancy include more difficulty breathing, swelling of the hands and feet from water retention, and having to urinate more often because of the uterus and baby pressing on your bladder.   PRENATAL EXAMS  · Blood work may continue to be done during prenatal exams. These tests are done to check on your health and the probable health of your baby. Blood work is used to follow your blood levels (hemoglobin). Anemia (low hemoglobin) is common during pregnancy. Iron and vitamins are given to help prevent this. You may also continue to be checked for diabetes. Some of the past blood tests may be done again.  · The size of the uterus is measured during each visit. This makes sure your baby is growing properly according to your pregnancy dates.  · Your blood pressure is checked every prenatal visit. This is to make sure you are not getting toxemia.  · Your urine is checked every prenatal visit for infection, diabetes and protein.  · Your weight is checked at each visit. This is done to make sure gains are happening at the suggested rate and that you and your baby are growing normally.  · Sometimes, an ultrasound is performed to confirm the position and the proper growth and development of the baby. This is a test done that bounces harmless sound waves off the baby so your caregiver can more accurately determine due dates.  · Discuss the type of pain medication and  anesthesia you will have during your labor and delivery.  · Discuss the possibility and anesthesia if a Cesarean Section might be necessary.  · Inform your caregiver if there is any mental or physical violence at home.  Sometimes, a specialized non-stress test, contraction stress test and biophysical profile are done to make sure the baby is not having a problem. Checking the amniotic fluid surrounding the baby is called an amniocentesis. The amniotic fluid is removed by sticking a needle into the belly (abdomen). This is sometimes done near the end of pregnancy if an early delivery is required. In this case, it is done to help make sure the baby's lungs are mature enough for the baby to live outside of the womb. If the lungs are not mature and it is unsafe to deliver the baby, an injection of cortisone medication is given to the mother 1 to 2 days before the delivery. This helps the baby's lungs mature and makes it safer to deliver the baby.  CHANGES OCCURING IN THE THIRD TRIMESTER OF PREGNANCY  Your body goes through many changes during pregnancy. They vary from person to person. Talk to your caregiver about changes you notice and are concerned about.  · During the last trimester, you have probably had an increase in your appetite. It is normal to have cravings for certain foods. This varies from person to person and pregnancy to pregnancy.  · You may begin to   get stretch marks on your hips, abdomen, and breasts. These are normal changes in the body during pregnancy. There are no exercises or medications to take which prevent this change.  · Constipation may be treated with a stool softener or adding bulk to your diet. Drinking lots of fluids, fiber in vegetables, fruits, and whole grains are helpful.  · Exercising is also helpful. If you have been very active up until your pregnancy, most of these activities can be continued during your pregnancy. If you have been less active, it is helpful to start an exercise  program such as walking. Consult your caregiver before starting exercise programs.  · Avoid all smoking, alcohol, un-prescribed drugs, herbs and "street drugs" during your pregnancy. These chemicals affect the formation and growth of the baby. Avoid chemicals throughout the pregnancy to ensure the delivery of a healthy infant.  · Backache, varicose veins and hemorrhoids may develop or get worse.  · You will tire more easily in the third trimester, which is normal.  · The baby's movements may be stronger and more often.  · You may become short of breath easily.  · Your belly button may stick out.  · A yellow discharge may leak from your breasts called colostrum.  · You may have a bloody mucus discharge. This usually occurs a few days to a week before labor begins.  HOME CARE INSTRUCTIONS   · Keep your caregiver's appointments. Follow your caregiver's instructions regarding medication use, exercise, and diet.  · During pregnancy, you are providing food for you and your baby. Continue to eat regular, well-balanced meals. Choose foods such as meat, fish, milk and other low fat dairy products, vegetables, fruits, and whole-grain breads and cereals. Your caregiver will tell you of the ideal weight gain.  · A physical sexual relationship may be continued throughout pregnancy if there are no other problems such as early (premature) leaking of amniotic fluid from the membranes, vaginal bleeding, or belly (abdominal) pain.  · Exercise regularly if there are no restrictions. Check with your caregiver if you are unsure of the safety of your exercises. Greater weight gain will occur in the last 2 trimesters of pregnancy. Exercising helps:  · Control your weight.  · Get you in shape for labor and delivery.  · You lose weight after you deliver.  · Rest a lot with legs elevated, or as needed for leg cramps or low back pain.  · Wear a good support or jogging bra for breast tenderness during pregnancy. This may help if worn during  sleep. Pads or tissues may be used in the bra if you are leaking colostrum.  · Do not use hot tubs, steam rooms, or saunas.  · Wear your seat belt when driving. This protects you and your baby if you are in an accident.  · Avoid raw meat, cat litter boxes and soil used by cats. These carry germs that can cause birth defects in the baby.  · It is easier to loose urine during pregnancy. Tightening up and strengthening the pelvic muscles will help with this problem. You can practice stopping your urination while you are going to the bathroom. These are the same muscles you need to strengthen. It is also the muscles you would use if you were trying to stop from passing gas. You can practice tightening these muscles up 10 times a set and repeating this about 3 times per day. Once you know what muscles to tighten up, do not perform these   exercises during urination. It is more likely to cause an infection by backing up the urine.  · Ask for help if you have financial, counseling or nutritional needs during pregnancy. Your caregiver will be able to offer counseling for these needs as well as refer you for other special needs.  · Make a list of emergency phone numbers and have them available.  · Plan on getting help from family or friends when you go home from the hospital.  · Make a trial run to the hospital.  · Take prenatal classes with the father to understand, practice and ask questions about the labor and delivery.  · Prepare the baby's room/nursery.  · Do not travel out of the city unless it is absolutely necessary and with the advice of your caregiver.  · Wear only low or no heal shoes to have better balance and prevent falling.  MEDICATIONS AND DRUG USE IN PREGNANCY  · Take prenatal vitamins as directed. The vitamin should contain 1 milligram of folic acid. Keep all vitamins out of reach of children. Only a couple vitamins or tablets containing iron may be fatal to a baby or young child when ingested.  · Avoid use  of all medications, including herbs, over-the-counter medications, not prescribed or suggested by your caregiver. Only take over-the-counter or prescription medicines for pain, discomfort, or fever as directed by your caregiver. Do not use aspirin, ibuprofen (Motrin®, Advil®, Nuprin®) or naproxen (Aleve®) unless OK'd by your caregiver.  · Let your caregiver also know about herbs you may be using.  · Alcohol is related to a number of birth defects. This includes fetal alcohol syndrome. All alcohol, in any form, should be avoided completely. Smoking will cause low birth rate and premature babies.  · Street/illegal drugs are very harmful to the baby. They are absolutely forbidden. A baby born to an addicted mother will be addicted at birth. The baby will go through the same withdrawal an adult does.  SEEK MEDICAL CARE IF:  You have any concerns or worries during your pregnancy. It is better to call with your questions if you feel they cannot wait, rather than worry about them.  DECISIONS ABOUT CIRCUMCISION  You may or may not know the sex of your baby. If you know your baby is a boy, it may be time to think about circumcision. Circumcision is the removal of the foreskin of the penis. This is the skin that covers the sensitive end of the penis. There is no proven medical need for this. Often this decision is made on what is popular at the time or based upon religious beliefs and social issues. You can discuss these issues with your caregiver or pediatrician.  SEEK IMMEDIATE MEDICAL CARE IF:   · An unexplained oral temperature above 102° F (38.9° C) develops, or as your caregiver suggests.  · You have leaking of fluid from the vagina (birth canal). If leaking membranes are suspected, take your temperature and tell your caregiver of this when you call.  · There is vaginal spotting, bleeding or passing clots. Tell your caregiver of the amount and how many pads are used.  · You develop a bad smelling vaginal discharge with  a change in the color from clear to white.  · You develop vomiting that lasts more than 24 hours.  · You develop chills or fever.  · You develop shortness of breath.  · You develop burning on urination.  · You loose more than 2 pounds of weight   problems, blurred or double vision.  If you have not felt your baby move for more than 1 hour. If you think the baby is not moving as much as usual, eat something with sugar in it and lie down on your left side for an hour. The baby should move at least 4 to 5 times per hour. Call right away if your baby moves less than that.  You fall, are in a car accident or any kind of trauma.  There is mental or physical violence at home. Document Released: 05/07/2001 Document Revised: 08/05/2011 Document Reviewed: 11/09/2008 [facility] Patient Information 2013 [place], [place].   Fetal Movement Counts Patient Name: __________________________________________________ Patient Due Date: ____________________ Melody Haver counts is highly recommended in high risk pregnancies, but it is a good idea for every pregnant woman to do. Start counting fetal movements at 28 weeks of the pregnancy. Fetal movements increase after eating a full meal or eating or drinking something sweet (the blood sugar is higher). It is also important to drink plenty of fluids (well hydrated) before doing the count. Lie on your left side because it helps with the circulation or you can sit in a comfortable chair with your arms over your belly (abdomen) with no distractions around you. DOING THE COUNT  Try to do the count the  same time of day each time you do it.  Mark the day and time, then see how long it takes for you to feel 10 movements (kicks, flutters, swishes, rolls). You should have at least 10 movements within 2 hours. You will most likely feel 10 movements in much less than 2 hours. If you do not, wait an hour and count again. After a couple of days you will see a pattern.  What you are looking for is a change in the pattern or not enough counts in 2 hours. Is it taking longer in time to reach 10 movements? SEEK MEDICAL CARE IF:  You feel less than 10 counts in 2 hours. Tried twice.  No movement in one hour.  The pattern is changing or taking longer each day to reach 10 counts in 2 hours.  You feel the baby is not moving as it usually does. Date: ____________ Movements: ____________ Start time: ____________ Doreatha Martin time: ____________  Date: ____________ Movements: ____________ Start time: ____________ Doreatha Martin time: ____________ Date: ____________ Movements: ____________ Start time: ____________ Doreatha Martin time: ____________ Date: ____________ Movements: ____________ Start time: ____________ Doreatha Martin time: ____________ Date: ____________ Movements: ____________ Start time: ____________ Doreatha Martin time: ____________ Date: ____________ Movements: ____________ Start time: ____________ Doreatha Martin time: ____________ Date: ____________ Movements: ____________ Start time: ____________ Doreatha Martin time: ____________ Date: ____________ Movements: ____________ Start time: ____________ Doreatha Martin time: ____________  Date: ____________ Movements: ____________ Start time: ____________ Doreatha Martin time: ____________ Date: ____________ Movements: ____________ Start time: ____________ Doreatha Martin time: ____________ Date: ____________ Movements: ____________ Start time: ____________ Doreatha Martin time: ____________ Date: ____________ Movements: ____________ Start time: ____________ Doreatha Martin time: ____________ Date: ____________ Movements: ____________ Start  time: ____________ Doreatha Martin time: ____________ Date: ____________ Movements: ____________ Start time: ____________ Doreatha Martin time: ____________ Date: ____________ Movements: ____________ Start time: ____________ Doreatha Martin time: ____________  Date: ____________ Movements: ____________ Start time: ____________ Doreatha Martin time: ____________ Date: ____________ Movements: ____________ Start time: ____________ Doreatha Martin time: ____________ Date: ____________ Movements: ____________ Start time: ____________ Doreatha Martin time: ____________ Date: ____________ Movements: ____________ Start time: ____________ Doreatha Martin time: ____________ Date: ____________ Movements: ____________ Start time: ____________ Doreatha Martin time: ____________ Date: ____________ Movements: ____________ Start time: ____________ Doreatha Martin time: ____________ Date: ____________ Movements: ____________ Start time: ____________  Finish time: ____________  Date: ____________ Movements: ____________ Start time: ____________ Doreatha Martin time: ____________ Date: ____________ Movements: ____________ Start time: ____________ Doreatha Martin time: ____________ Date: ____________ Movements: ____________ Start time: ____________ Doreatha Martin time: ____________ Date: ____________ Movements: ____________ Start time: ____________ Doreatha Martin time: ____________ Date: ____________ Movements: ____________ Start time: ____________ Doreatha Martin time: ____________ Date: ____________ Movements: ____________ Start time: ____________ Doreatha Martin time: ____________ Date: ____________ Movements: ____________ Start time: ____________ Doreatha Martin time: ____________  Date: ____________ Movements: ____________ Start time: ____________ Doreatha Martin time: ____________ Date: ____________ Movements: ____________ Start time: ____________ Doreatha Martin time: ____________ Date: ____________ Movements: ____________ Start time: ____________ Doreatha Martin time: ____________ Date: ____________ Movements: ____________ Start time: ____________ Doreatha Martin time:  ____________ Date: ____________ Movements: ____________ Start time: ____________ Doreatha Martin time: ____________ Date: ____________ Movements: ____________ Start time: ____________ Doreatha Martin time: ____________ Date: ____________ Movements: ____________ Start time: ____________ Doreatha Martin time: ____________  Date: ____________ Movements: ____________ Start time: ____________ Doreatha Martin time: ____________ Date: ____________ Movements: ____________ Start time: ____________ Doreatha Martin time: ____________ Date: ____________ Movements: ____________ Start time: ____________ Doreatha Martin time: ____________ Date: ____________ Movements: ____________ Start time: ____________ Doreatha Martin time: ____________ Date: ____________ Movements: ____________ Start time: ____________ Doreatha Martin time: ____________ Date: ____________ Movements: ____________ Start time: ____________ Doreatha Martin time: ____________ Date: ____________ Movements: ____________ Start time: ____________ Doreatha Martin time: ____________  Date: ____________ Movements: ____________ Start time: ____________ Doreatha Martin time: ____________ Date: ____________ Movements: ____________ Start time: ____________ Doreatha Martin time: ____________ Date: ____________ Movements: ____________ Start time: ____________ Doreatha Martin time: ____________ Date: ____________ Movements: ____________ Start time: ____________ Doreatha Martin time: ____________ Date: ____________ Movements: ____________ Start time: ____________ Doreatha Martin time: ____________ Date: ____________ Movements: ____________ Start time: ____________ Doreatha Martin time: ____________ Date: ____________ Movements: ____________ Start time: ____________ Doreatha Martin time: ____________  Date: ____________ Movements: ____________ Start time: ____________ Doreatha Martin time: ____________ Date: ____________ Movements: ____________ Start time: ____________ Doreatha Martin time: ____________ Date: ____________ Movements: ____________ Start time: ____________ Doreatha Martin time: ____________ Date: ____________ Movements:  ____________ Start time: ____________ Doreatha Martin time: ____________ Date: ____________ Movements: ____________ Start time: ____________ Doreatha Martin time: ____________ Date: ____________ Movements: ____________ Start time: ____________ Doreatha Martin time: ____________ Document Released: 06/12/2006 Document Revised: 08/05/2011 Document Reviewed: 12/13/2008 [employer] Patient Information 2013 [employer].   Hypertension During Pregnancy Hypertension is also called high blood pressure. It can occur at any time in life and during pregnancy. When you have hypertension, there is extra pressure inside your blood vessels that carry blood from the heart to the rest of your body (arteries). Hypertension during pregnancy can cause problems for you and your baby. Your baby might not weigh as much as it should at birth or might be born early (premature). Very bad cases of hypertension during pregnancy can be life-threatening.  There are different types of hypertension during pregnancy.   Chronic hypertension. This happens when a woman has hypertension before pregnancy and it continues during pregnancy.  Gestational hypertension. This is when hypertension develops during pregnancy.  Preeclampsia or toxemia of pregnancy. This is a very serious type of hypertension that develops only during pregnancy. It is a disease that affects the whole body (systemic) and can be very dangerous for both mother and baby.  Gestational hypertension and preeclampsia usually go away after your baby is born. Blood pressure generally stabilizes within 6 weeks. Women who have hypertension during pregnancy have a greater chance of developing hypertension later in life or with future pregnancies. UNDERSTANDING BLOOD PRESSURE Blood pressure moves blood in your body. Sometimes, the force that moves the blood becomes too strong.  A blood pressure reading is given in 2 numbers and looks  like a fraction.  The top number is called the systolic pressure.  When your heart beats, it forces more blood to flow through the arteries. Pressure inside the arteries goes up.  The bottom number is the diastolic pressure. Pressure goes down between beats. That is when the heart is resting.  You may have hypertension if:  Your systolic blood pressure is above 140.  Your diastolic pressure is above 90. RISK FACTORS Some factors make you more likely to develop hypertension during pregnancy. Risk factors include:  Having hypertension before pregnancy.  Having hypertension during a previous pregnancy.  Being overweight.  Being older than 40.  Being pregnant with more than 1 baby (multiples).  Having diabetes or kidney problems. SYMPTOMS Chronic and gestational hypertension may not cause symptoms. Preeclampsia has symptoms, which may include:  Increased protein in your urine. Your caregiver will check for this at every prenatal visit.  Swelling of your hands and face.  Rapid weight gain.  Headaches.  Visual changes.  Being bothered by light.  Abdominal pain, especially in the right upper area.  Chest pain.  Shortness of breath.  Increased reflexes.  Seizures. Seizures occur with a more severe form of preeclampsia, called eclampsia. DIAGNOSIS   You may be diagnosed with hypertension during pregnancy during a regular prenatal exam. At each visit, tests may include:  Blood pressure checks.  A urine test to check for protein in your urine.  The type of hypertension you are diagnosed with depends on when you developed it. It also depends on your specific blood pressure reading.  Developing hypertension before 20 weeks of pregnancy is consistent with chronic hypertension.  Developing hypertension after 20 weeks of pregnancy is consistent with gestational hypertension.  Hypertension with increased urinary protein is diagnosed as preeclampsia.  Blood pressure measurements that stay above 160 systolic or 110 diastolic are a sign of  severe preeclampsia. TREATMENT Treatment for hypertension during pregnancy varies. Treatment depends on the type of hypertension and how serious it is.  If you take medicine for chronic hypertension, you may need to switch medicines.  Drugs called ACE inhibitors should not be taken during pregnancy.  Low-dose aspirin may be suggested for women who have risk factors for preeclampsia.  If you have gestational hypertension, you may need to take a blood pressure medicine that is safe during pregnancy. Your caregiver will recommend the appropriate medicine.  If you have severe preeclampsia, you may need to be in the hospital. Caregivers will watch you and the baby very closely. You also may need to take medicine (magnesium sulfate) to prevent seizures and lower blood pressure.  Sometimes an early delivery is needed. This may be the case if the condition worsens. It would be done to protect you and the baby. The only cure for preeclampsia is delivery. HOME CARE INSTRUCTIONS  Schedule and keep all of your regular prenatal care.  Follow your caregiver's instructions for taking medicines. Tell your caregiver about all medicines you take. This includes over-the-counter medicines.  Eat as little salt as possible.  Get regular exercise.  Do not drink alcohol.  Do not use tobacco products.  Do not drink products with caffeine.  Lie on your left side when resting.  Tell your doctor if you have any preeclampsia symptoms. SEEK IMMEDIATE MEDICAL CARE IF:  You have severe abdominal pain.  You have sudden swelling in the hands, ankles, or face.  You gain 4 pounds (1.8 kg) or more in 1 week.  You vomit repeatedly.  You have vaginal bleeding.  You do not feel the baby moving as much.  You have a headache.  You have blurred or double vision.  You have muscle twitching or spasms.  You have shortness of breath.  You have blue fingernails and lips.  You have blood in your  urine. MAKE SURE YOU:  Understand these instructions.  Will watch your condition.  Will get help right away if you are not doing well. Document Released: 01/29/2011 Document Revised: 08/05/2011 Document Reviewed: 01/29/2011 [facility] Patient Information 2013 [place], [place].

## 2012-03-16 NOTE — Progress Notes (Signed)
Repeat BP (manual) = 130/78

## 2012-03-16 NOTE — Progress Notes (Signed)
Pt felt very good FM during NST. Pt instructed to perform BID kick counts if decr. FM reoccurs. Pt to begin twice weekly testing due to Gest. Dm and Chr HTN- next NST on 10/25. Pt voiced understanding.

## 2012-03-16 NOTE — Progress Notes (Signed)
Out of [place]. Last dose last night. No HA, vision changes or epigastric pain. Tr proteinuria. BP recheck 138/80. Decrease FM. Active baby during exam. NST category I. Did not bring log. Increased Glyburide to 2.5 BID since last visit. States fasting CBGs <95, postprandials <120. Strongly encouraged to bring log and meter. Start antenatal testing. PIH precautions. Growth [place] in 1 week.

## 2012-03-20 ENCOUNTER — Ambulatory Visit (INDEPENDENT_AMBULATORY_CARE_PROVIDER_SITE_OTHER): Payer: Self-pay | Admitting: *Deleted

## 2012-03-20 VITALS — BP 140/87 | Wt 267.1 lb

## 2012-03-20 DIAGNOSIS — O10019 Pre-existing essential hypertension complicating pregnancy, unspecified trimester: Secondary | ICD-10-CM

## 2012-03-20 DIAGNOSIS — O09299 Supervision of pregnancy with other poor reproductive or obstetric history, unspecified trimester: Secondary | ICD-10-CM

## 2012-03-20 NOTE — Progress Notes (Signed)
P-82 

## 2012-03-23 ENCOUNTER — Encounter: Payer: Self-pay | Admitting: Obstetrics & Gynecology

## 2012-03-23 ENCOUNTER — Ambulatory Visit (INDEPENDENT_AMBULATORY_CARE_PROVIDER_SITE_OTHER): Payer: Self-pay | Admitting: Obstetrics and Gynecology

## 2012-03-23 ENCOUNTER — Ambulatory Visit (HOSPITAL_COMMUNITY)
Admission: RE | Admit: 2012-03-23 | Discharge: 2012-03-23 | Disposition: A | Discharge status: Home / Self Care | Payer: Self-pay | Source: Ambulatory Visit | Attending: Obstetrics & Gynecology | Admitting: Obstetrics & Gynecology

## 2012-03-23 VITALS — BP 129/80 | Temp 98.4°F | Wt 267.3 lb

## 2012-03-23 DIAGNOSIS — O09529 Supervision of elderly multigravida, unspecified trimester: Secondary | ICD-10-CM

## 2012-03-23 DIAGNOSIS — O10019 Pre-existing essential hypertension complicating pregnancy, unspecified trimester: Secondary | ICD-10-CM

## 2012-03-23 DIAGNOSIS — O99810 Abnormal glucose complicating pregnancy: Secondary | ICD-10-CM | POA: Insufficient documentation

## 2012-03-23 DIAGNOSIS — Z8751 Personal history of pre-term labor: Secondary | ICD-10-CM | POA: Insufficient documentation

## 2012-03-23 DIAGNOSIS — O98519 Other viral diseases complicating pregnancy, unspecified trimester: Secondary | ICD-10-CM | POA: Insufficient documentation

## 2012-03-23 DIAGNOSIS — O34219 Maternal care for unspecified type scar from previous cesarean delivery: Secondary | ICD-10-CM

## 2012-03-23 DIAGNOSIS — I10 Essential (primary) hypertension: Secondary | ICD-10-CM

## 2012-03-23 DIAGNOSIS — O09899 Supervision of other high risk pregnancies, unspecified trimester: Secondary | ICD-10-CM

## 2012-03-23 DIAGNOSIS — O09219 Supervision of pregnancy with history of pre-term labor, unspecified trimester: Secondary | ICD-10-CM

## 2012-03-23 DIAGNOSIS — O99210 Obesity complicating pregnancy, unspecified trimester: Secondary | ICD-10-CM

## 2012-03-23 DIAGNOSIS — E669 Obesity, unspecified: Secondary | ICD-10-CM

## 2012-03-23 DIAGNOSIS — O09299 Supervision of pregnancy with other poor reproductive or obstetric history, unspecified trimester: Secondary | ICD-10-CM | POA: Insufficient documentation

## 2012-03-23 DIAGNOSIS — O0990 Supervision of high risk pregnancy, unspecified, unspecified trimester: Secondary | ICD-10-CM

## 2012-03-23 DIAGNOSIS — A6009 Herpesviral infection of other urogenital tract: Secondary | ICD-10-CM

## 2012-03-23 DIAGNOSIS — A6000 Herpesviral infection of urogenital system, unspecified: Secondary | ICD-10-CM

## 2012-03-23 DIAGNOSIS — Z8759 Personal history of other complications of pregnancy, childbirth and the puerperium: Secondary | ICD-10-CM

## 2012-03-23 LAB — POCT URINALYSIS DIP (DEVICE)
Bilirubin Urine: NEGATIVE
Glucose, UA: NEGATIVE mg/dL
Ketones, ur: NEGATIVE mg/dL
Leukocytes, UA: NEGATIVE
Protein, ur: NEGATIVE mg/dL
Specific Gravity, Urine: 1.025 (ref 1.005–1.030)

## 2012-03-23 NOTE — Progress Notes (Signed)
Pt reports having 8 UC's per hour on 10/26 x 1.5 hrs.

## 2012-03-23 NOTE — Progress Notes (Signed)
NST reviewed and reactive. CBG f 74-95 2 hr pp 92-131. FM/PTL precautions reviewed. Continue current glyburide regimen.

## 2012-03-23 NOTE — Progress Notes (Signed)
Pulse- 96  Pain-"pain from neck to end of spine"

## 2012-03-27 ENCOUNTER — Ambulatory Visit (INDEPENDENT_AMBULATORY_CARE_PROVIDER_SITE_OTHER): Payer: Self-pay | Admitting: *Deleted

## 2012-03-27 VITALS — BP 127/72 | Wt 266.6 lb

## 2012-03-27 DIAGNOSIS — O10019 Pre-existing essential hypertension complicating pregnancy, unspecified trimester: Secondary | ICD-10-CM

## 2012-03-27 DIAGNOSIS — O99810 Abnormal glucose complicating pregnancy: Secondary | ICD-10-CM | POA: Insufficient documentation

## 2012-03-27 NOTE — Progress Notes (Signed)
P = 98 

## 2012-03-30 ENCOUNTER — Ambulatory Visit (INDEPENDENT_AMBULATORY_CARE_PROVIDER_SITE_OTHER): Payer: Self-pay | Admitting: Family Medicine

## 2012-03-30 ENCOUNTER — Encounter: Payer: Self-pay | Admitting: Family Medicine

## 2012-03-30 VITALS — BP 155/97 | Temp 96.9°F | Wt 268.7 lb

## 2012-03-30 DIAGNOSIS — O139 Gestational [pregnancy-induced] hypertension without significant proteinuria, unspecified trimester: Secondary | ICD-10-CM

## 2012-03-30 DIAGNOSIS — O99810 Abnormal glucose complicating pregnancy: Secondary | ICD-10-CM

## 2012-03-30 DIAGNOSIS — O0990 Supervision of high risk pregnancy, unspecified, unspecified trimester: Secondary | ICD-10-CM

## 2012-03-30 DIAGNOSIS — O169 Unspecified maternal hypertension, unspecified trimester: Secondary | ICD-10-CM

## 2012-03-30 LAB — CBC
Hemoglobin: 11.5 g/dL — ABNORMAL LOW (ref 12.0–15.0)
MCH: 21.9 pg — ABNORMAL LOW (ref 26.0–34.0)
MCV: 68.2 fL — ABNORMAL LOW (ref 78.0–100.0)
Platelets: 234 10*3/uL (ref 150–400)
RBC: 5.25 MIL/uL — ABNORMAL HIGH (ref 3.87–5.11)
WBC: 7.4 10*3/uL (ref 4.0–10.5)

## 2012-03-30 LAB — POCT URINALYSIS DIP (DEVICE)
Bilirubin Urine: NEGATIVE
Glucose, UA: 100 mg/dL — AB
Hgb urine dipstick: NEGATIVE
Specific Gravity, Urine: 1.015 (ref 1.005–1.030)
Urobilinogen, UA: 0.2 mg/dL (ref 0.0–1.0)

## 2012-03-30 LAB — COMPREHENSIVE METABOLIC PANEL
ALT: 11 U/L (ref 0–35)
CO2: 23 mEq/L (ref 19–32)
Calcium: 8.9 mg/dL (ref 8.4–10.5)
Chloride: 105 mEq/L (ref 96–112)
Glucose, Bld: 102 mg/dL — ABNORMAL HIGH (ref 70–99)
Sodium: 135 mEq/L (ref 135–145)
Total Protein: 6.3 g/dL (ref 6.0–8.3)

## 2012-03-30 NOTE — Patient Instructions (Addendum)
Pregnancy - Third Trimester The third trimester of pregnancy (the last 3 months) is a period of the most rapid growth for you and your baby. The baby approaches a length of 20 inches and a weight of 6 to 10 pounds. The baby is adding on fat and getting ready for life outside your body. While inside, babies have periods of sleeping and waking, suck their thumbs, and hiccups. You can often feel small contractions of the uterus. This is false labor. It is also called Braxton-Hicks contractions. This is like a practice for labor. The usual problems in this stage of pregnancy include more difficulty breathing, swelling of the hands and feet from water retention, and having to urinate more often because of the uterus and baby pressing on your bladder.  PRENATAL EXAMS  Blood work may continue to be done during prenatal exams. These tests are done to check on your health and the probable health of your baby. Blood work is used to follow your blood levels (hemoglobin). Anemia (low hemoglobin) is common during pregnancy. Iron and vitamins are given to help prevent this. You may also continue to be checked for diabetes. Some of the past blood tests may be done again.  The size of the uterus is measured during each visit. This makes sure your baby is growing properly according to your pregnancy dates.  Your blood pressure is checked every prenatal visit. This is to make sure you are not getting toxemia.  Your urine is checked every prenatal visit for infection, diabetes and protein.  Your weight is checked at each visit. This is done to make sure gains are happening at the suggested rate and that you and your baby are growing normally.  Sometimes, an ultrasound is performed to confirm the position and the proper growth and development of the baby. This is a test done that bounces harmless sound waves off the baby so your caregiver can more accurately determine due dates.  Discuss the type of pain medication  and anesthesia you will have during your labor and delivery.  Discuss the possibility and anesthesia if a Cesarean Section might be necessary.  Inform your caregiver if there is any mental or physical violence at home. Sometimes, a specialized non-stress test, contraction stress test and biophysical profile are done to make sure the baby is not having a problem. Checking the amniotic fluid surrounding the baby is called an amniocentesis. The amniotic fluid is removed by sticking a needle into the belly (abdomen). This is sometimes done near the end of pregnancy if an early delivery is required. In this case, it is done to help make sure the baby's lungs are mature enough for the baby to live outside of the womb. If the lungs are not mature and it is unsafe to deliver the baby, an injection of cortisone medication is given to the mother 1 to 2 days before the delivery. This helps the baby's lungs mature and makes it safer to deliver the baby. CHANGES OCCURING IN THE THIRD TRIMESTER OF PREGNANCY Your body goes through many changes during pregnancy. They vary from person to person. Talk to your caregiver about changes you notice and are concerned about.  During the last trimester, you have probably had an increase in your appetite. It is normal to have cravings for certain foods. This varies from person to person and pregnancy to pregnancy.  You may begin to get stretch marks on your hips, abdomen, and breasts. These are normal changes in the body   during pregnancy. There are no exercises or medications to take which prevent this change.  Constipation may be treated with a stool softener or adding bulk to your diet. Drinking lots of fluids, fiber in vegetables, fruits, and whole grains are helpful.  Exercising is also helpful. If you have been very active up until your pregnancy, most of these activities can be continued during your pregnancy. If you have been less active, it is helpful to start an  exercise program such as walking. Consult your caregiver before starting exercise programs.  Avoid all smoking, alcohol, un-prescribed drugs, herbs and "street drugs" during your pregnancy. These chemicals affect the formation and growth of the baby. Avoid chemicals throughout the pregnancy to ensure the delivery of a healthy infant.  Backache, varicose veins and hemorrhoids may develop or get worse.  You will tire more easily in the third trimester, which is normal.  The baby's movements may be stronger and more often.  You may become short of breath easily.  Your belly button may stick out.  A yellow discharge may leak from your breasts called colostrum.  You may have a bloody mucus discharge. This usually occurs a few days to a week before labor begins. HOME CARE INSTRUCTIONS   Keep your caregiver's appointments. Follow your caregiver's instructions regarding medication use, exercise, and diet.  During pregnancy, you are providing food for you and your baby. Continue to eat regular, well-balanced meals. Choose foods such as meat, fish, milk and other low fat dairy products, vegetables, fruits, and whole-grain breads and cereals. Your caregiver will tell you of the ideal weight gain.  A physical sexual relationship may be continued throughout pregnancy if there are no other problems such as early (premature) leaking of amniotic fluid from the membranes, vaginal bleeding, or belly (abdominal) pain.  Exercise regularly if there are no restrictions. Check with your caregiver if you are unsure of the safety of your exercises. Greater weight gain will occur in the last 2 trimesters of pregnancy. Exercising helps:  Control your weight.  Get you in shape for labor and delivery.  You lose weight after you deliver.  Rest a lot with legs elevated, or as needed for leg cramps or low back pain.  Wear a good support or jogging bra for breast tenderness during pregnancy. This may help if worn  during sleep. Pads or tissues may be used in the bra if you are leaking colostrum.  Do not use hot tubs, steam rooms, or saunas.  Wear your seat belt when driving. This protects you and your baby if you are in an accident.  Avoid raw meat, cat litter boxes and soil used by cats. These carry germs that can cause birth defects in the baby.  It is easier to loose urine during pregnancy. Tightening up and strengthening the pelvic muscles will help with this problem. You can practice stopping your urination while you are going to the bathroom. These are the same muscles you need to strengthen. It is also the muscles you would use if you were trying to stop from passing gas. You can practice tightening these muscles up 10 times a set and repeating this about 3 times per day. Once you know what muscles to tighten up, do not perform these exercises during urination. It is more likely to cause an infection by backing up the urine.  Ask for help if you have financial, counseling or nutritional needs during pregnancy. Your caregiver will be able to offer counseling for these   needs as well as refer you for other special needs.  Make a list of emergency phone numbers and have them available.  Plan on getting help from family or friends when you go home from the hospital.  Make a trial run to the hospital.  Take prenatal classes with the father to understand, practice and ask questions about the labor and delivery.  Prepare the baby's room/nursery.  Do not travel out of the city unless it is absolutely necessary and with the advice of your caregiver.  Wear only low or no heal shoes to have better balance and prevent falling. MEDICATIONS AND DRUG USE IN PREGNANCY  Take prenatal vitamins as directed. The vitamin should contain 1 milligram of folic acid. Keep all vitamins out of reach of children. Only a couple vitamins or tablets containing iron may be fatal to a baby or young child when  ingested.  Avoid use of all medications, including herbs, over-the-counter medications, not prescribed or suggested by your caregiver. Only take over-the-counter or prescription medicines for pain, discomfort, or fever as directed by your caregiver. Do not use aspirin, ibuprofen (Motrin, Advil, Nuprin) or naproxen (Aleve) unless OK'd by your caregiver.  Let your caregiver also know about herbs you may be using.  Alcohol is related to a number of birth defects. This includes fetal alcohol syndrome. All alcohol, in any form, should be avoided completely. Smoking will cause low birth rate and premature babies.  Street/illegal drugs are very harmful to the baby. They are absolutely forbidden. A baby born to an addicted mother will be addicted at birth. The baby will go through the same withdrawal an adult does. SEEK MEDICAL CARE IF: You have any concerns or worries during your pregnancy. It is better to call with your questions if you feel they cannot wait, rather than worry about them. DECISIONS ABOUT CIRCUMCISION You may or may not know the sex of your baby. If you know your baby is a boy, it may be time to think about circumcision. Circumcision is the removal of the foreskin of the penis. This is the skin that covers the sensitive end of the penis. There is no proven medical need for this. Often this decision is made on what is popular at the time or based upon religious beliefs and social issues. You can discuss these issues with your caregiver or pediatrician. SEEK IMMEDIATE MEDICAL CARE IF:   An unexplained oral temperature above 102 F (38.9 C) develops, or as your caregiver suggests.  You have leaking of fluid from the vagina (birth canal). If leaking membranes are suspected, take your temperature and tell your caregiver of this when you call.  There is vaginal spotting, bleeding or passing clots. Tell your caregiver of the amount and how many pads are used.  You develop a bad smelling  vaginal discharge with a change in the color from clear to white.  You develop vomiting that lasts more than 24 hours.  You develop chills or fever.  You develop shortness of breath.  You develop burning on urination.  You loose more than 2 pounds of weight or gain more than 2 pounds of weight or as suggested by your caregiver.  You notice sudden swelling of your face, hands, and feet or legs.  You develop belly (abdominal) pain. Round ligament discomfort is a common non-cancerous (benign) cause of abdominal pain in pregnancy. Your caregiver still must evaluate you.  You develop a severe headache that does not go away.  You develop visual   problems, blurred or double vision.  If you have not felt your baby move for more than 1 hour. If you think the baby is not moving as much as usual, eat something with sugar in it and lie down on your left side for an hour. The baby should move at least 4 to 5 times per hour. Call right away if your baby moves less than that.  You fall, are in a car accident or any kind of trauma.  There is mental or physical violence at home. Document Released: 05/07/2001 Document Revised: 08/05/2011 Document Reviewed: 11/09/2008 [employer] Patient Information 2013 [employer].  Breastfeeding Deciding to breastfeed is one of the best choices you can make for you and your baby. The information that follows gives a brief overview of the benefits of breastfeeding as well as common topics surrounding breastfeeding. BENEFITS OF BREASTFEEDING For the baby  The first milk (colostrum) helps the baby's digestive system function better.   There are antibodies in the mother's milk that help the baby fight off infections.   The baby has a lower incidence of asthma, allergies, and sudden infant death syndrome (SIDS).   The nutrients in breast milk are better for the baby than infant formulas, and breast milk helps the baby's brain grow better.   Babies who  breastfeed have less gas, colic, and constipation.  For the mother  Breastfeeding helps develop a very special bond between the mother and her baby.   Breastfeeding is convenient, always available at the correct temperature, and costs nothing.   Breastfeeding burns calories in the mother and helps her lose weight that was gained during pregnancy.   Breastfeeding makes the uterus contract back down to normal size faster and slows bleeding following delivery.   Breastfeeding mothers have a lower risk of developing breast cancer.  BREASTFEEDING FREQUENCY  A healthy, full-term baby may breastfeed as often as every hour or space his or her feedings to every 3 hours.   Watch your baby for signs of hunger. Nurse your baby if he or she shows signs of hunger. How often you nurse will vary from baby to baby.   Nurse as often as the baby requests, or when you feel the need to reduce the fullness of your breasts.   Awaken the baby if it has been 3 4 hours since the last feeding.   Frequent feeding will help the mother make more milk and will help prevent problems, such as sore nipples and engorgement of the breasts.  BABY'S POSITION AT THE BREAST  Whether lying down or sitting, be sure that the baby's tummy is facing your tummy.   Support the breast with 4 fingers underneath the breast and the thumb above. Make sure your fingers are well away from the nipple and baby's mouth.   Stroke the baby's lips gently with your finger or nipple.   When the baby's mouth is open wide enough, place all of your nipple and as much of the areola as possible into your baby's mouth.   Pull the baby in close so the tip of the nose and the baby's cheeks touch the breast during the feeding.  FEEDINGS AND SUCTION  The length of each feeding varies from baby to baby and from feeding to feeding.   The baby must suck about 2 3 minutes for your milk to get to him or her. This is called a "let down."  For this reason, allow the baby to feed on each breast as   long as he or she wants. Your baby will end the feeding when he or she has received the right balance of nutrients.   To break the suction, put your finger into the corner of the baby's mouth and slide it between his or her gums before removing your breast from his or her mouth. This will help prevent sore nipples.  HOW TO TELL WHETHER YOUR BABY IS GETTING ENOUGH BREAST MILK. Wondering whether or not your baby is getting enough milk is a common concern among mothers. You can be assured that your baby is getting enough milk if:   Your baby is actively sucking and you hear swallowing.   Your baby seems relaxed and satisfied after a feeding.   Your baby nurses at least 8 12 times in a 24 hour time period. Nurse your baby until he or she unlatches or falls asleep at the first breast (at least 10 20 minutes), then offer the second side.   Your baby is wetting 5 6 disposable diapers (6 8 cloth diapers) in a 24 hour period by 5 6 days of age.   Your baby is having at least 3 4 stools every 24 hours for the first 6 weeks. The stool should be soft and yellow.   Your baby should gain 4 7 ounces per week after he or she is 4 days old.   Your breasts feel softer after nursing.  REDUCING BREAST ENGORGEMENT  In the first week after your baby is born, you may experience signs of breast engorgement. When breasts are engorged, they feel heavy, warm, full, and may be tender to the touch. You can reduce engorgement if you:   Nurse frequently, every 2 3 hours. Mothers who breastfeed early and often have fewer problems with engorgement.   Place light ice packs on your breasts for 10 20 minutes between feedings. This reduces swelling. Wrap the ice packs in a lightweight towel to protect your skin. Bags of frozen vegetables work well for this purpose.   Take a warm shower or apply warm, moist heat to your breast for 5 10 minutes just before  each feeding. This increases circulation and helps the milk flow.   Gently massage your breast before and during the feeding. Using your finger tips, massage from the chest wall towards your nipple in a circular motion.   Make sure that the baby empties at least one breast at every feeding before switching sides.   Use a breast pump to empty the breasts if your baby is sleepy or not nursing well. You may also want to pump if you are returning to work oryou feel you are getting engorged.   Avoid bottle feeds, pacifiers, or supplemental feedings of water or juice in place of breastfeeding. Breast milk is all the food your baby needs. It is not necessary for your baby to have water or formula. In fact, to help your breasts make more milk, it is best not to give your baby supplemental feedings during the early weeks.   Be sure the baby is latched on and positioned properly while breastfeeding.   Wear a supportive bra, avoiding underwire styles.   Eat a balanced diet with enough fluids.   Rest often, relax, and take your prenatal vitamins to prevent fatigue, stress, and anemia.  If you follow these suggestions, your engorgement should improve in 24 48 hours. If you are still experiencing difficulty, call your lactation consultant or caregiver.  CARING FOR YOURSELF Take care of your   breasts  Bathe or shower daily.   Avoid using soap on your nipples.   Start feedings on your left breast at one feeding and on your right breast at the next feeding.   You will notice an increase in your milk supply 2 5 days after delivery. You may feel some discomfort from engorgement, which makes your breasts very firm and often tender. Engorgement "peaks" out within 24 48 hours. In the meantime, apply warm moist towels to your breasts for 5 10 minutes before feeding. Gentle massage and expression of some milk before feeding will soften your breasts, making it easier for your baby to latch on.    Wear a well-fitting nursing bra, and air dry your nipples for a 3 after each feeding.   Only use cotton bra pads.   Only use pure lanolin on your nipples after nursing. You do not need to wash it off before feeding the baby again. Another option is to express a few drops of breast milk and gently massage it into your nipples.  Take care of yourself  Eat well-balanced meals and nutritious snacks.   Drinking milk, fruit juice, and water to satisfy your thirst (about 8 glasses a day).   Get plenty of rest.  Avoid foods that you notice affect the baby in a bad way.  SEEK MEDICAL CARE IF:   You have difficulty with breastfeeding and need help.   You have a hard, red, sore area on your breast that is accompanied by a fever.   Your baby is too sleepy to eat well or is having trouble sleeping.   Your baby is wetting less than 6 diapers a day, by 37 days of age.   Your baby's skin or white part of his or her eyes is more yellow than it was in the hospital.   You feel depressed.  Document Released: 05/13/2005 Document Revised: 11/12/2011 Document Reviewed: 08/11/2011 [facility] Patient Information 2013 [place], [place]. Preeclampsia and Eclampsia Preeclampsia is a condition of high blood pressure during pregnancy. It can happen at 20 weeks or later in pregnancy. If high blood pressure occurs in the second half of pregnancy with no other symptoms, it is called gestational hypertension and goes away after the baby is born. If any of the symptoms listed below develop with gestational hypertension, it is then called preeclampsia. Eclampsia (convulsions) may follow preeclampsia. This is one of the reasons for regular prenatal checkups. Early diagnosis and treatment are very important to prevent eclampsia. CAUSES  There is no known cause of preeclampsia/eclampsia in pregnancy. There are several known conditions that may put the pregnant woman at risk, such as:  The first  pregnancy.  Having preeclampsia in a past pregnancy.  Having lasting (chronic) high blood pressure.  Having multiples (twins, triplets).  Being age 40 or older.  African American ethnic background.  Having kidney disease or diabetes.  Medical conditions such as lupus or blood diseases.  Being overweight (obese). SYMPTOMS   High blood pressure.  Headaches.  Sudden weight gain.  Swelling of hands, face, legs, and feet.  Protein in the urine.  Feeling sick to your stomach (nauseous) and throwing up (vomiting).  Vision problems (blurred or double vision).  Numbness in the face, arms, legs, and feet.  Dizziness.  Slurred speech.  Preeclampsia can cause growth retardation in the fetus.  Separation (abruption) of the placenta.  Not enough fluid in the amniotic sac (oligohydramnios).  Sensitivity to bright lights.  Belly (abdominal) pain. DIAGNOSIS  If protein is found in the urine in the second half of pregnancy, this is considered preeclampsia. Other symptoms mentioned above may also be present. TREATMENT  It is necessary to treat this.  Your caregiver may prescribe bed rest early in this condition. Plenty of rest and salt restriction may be all that is needed.  Medicines may be necessary to lower blood pressure if the condition does not respond to more conservative measures.  In more severe cases, hospitalization may be needed:  For treatment of blood pressure.  To control fluid retention.  To monitor the baby to see if the condition is causing harm to the baby.  Hospitalization is the best way to treat the first sign of preeclampsia. This is so the mother and baby can be watched closely and blood tests can be done effectively and correctly.  If the condition becomes severe, it may be necessary to induce labor or to remove the infant by surgical means (cesarean section). The best cure for preeclampsia/eclampsia is to deliver the baby. Preeclampsia and  eclampsia involve risks to mother and infant. Your caregiver will discuss these risks with you. Together, you can work out the best possible approach to your problems. Make sure you keep your prenatal visits as scheduled. Not keeping appointments could result in a chronic or permanent injury, pain, disability to you, and death or injury to you or your unborn baby. If there is any problem keeping the appointment, you must call to reschedule. HOME CARE INSTRUCTIONS   Keep your prenatal appointments and tests as scheduled.  Tell your caregiver if you have any of the above risk factors.  Get plenty of rest and sleep.  Eat a balanced diet that is low in salt, and do not add salt to your food.  Avoid stressful situations.  Only take over-the-counter and prescriptions medicines for pain, discomfort, or fever as directed by your caregiver. SEEK IMMEDIATE MEDICAL CARE IF:   You develop severe swelling anywhere in the body. This usually occurs in the legs.  You gain 5 lb/2.3 kg or more in a week.  You develop a severe headache, dizziness, problems with your vision, or confusion.  You have abdominal pain, nausea, or vomiting.  You have a seizure.  You have trouble moving any part of your body, or you develop numbness or problems speaking.  You have bruising or abnormal bleeding from anywhere in the body.  You develop a stiff neck.  You pass out. MAKE SURE YOU:   Understand these instructions.  Will watch your condition.  Will get help right away if you are not doing well or get worse. Document Released: 05/10/2000 Document Revised: 08/05/2011 Document Reviewed: 12/25/2007 [facility] Patient Information 2013 [place], [place].

## 2012-03-30 NOTE — Progress Notes (Signed)
P = 98 Mild edema in hands Pain in neck radiating to lower back

## 2012-03-30 NOTE — Progress Notes (Signed)
No BS today but reports BS are where they are supposed to be.   BP up--no prot.  Will check labs and 24 hr urine. Scheduled for C-s

## 2012-03-30 NOTE — Progress Notes (Signed)
Spoke with dr. Diamantina Monks who is the MFM who does the prenatal care for all twin pregnancies at [place] [facility] [place] at [facility].  He cannpt say that the operative note is not correct, so he is also assuming the patinet had a low transverse c/s.  However, he does not disregard the fact the pt remembers hearing something about a vertical scar on her uterus.  Questionable that she may have heard that it is a possibility prior to the c/s.  I also spoke with our MFM (Dr. Galen Daft) who did her fellowship at [facility] and knows both of the surgeons on listed on the op note.  She thinks they both were excellent and has good attention to detail.  Also given the specifics of the op note, it is unlikely that this was a "smart phrase" that was placed into the patient's chart.  The conclusion after all the conversations listed above is for the patient to have a schedule c/s at 39 weeks.  If the patient comes into MAU with contractions, have a low threshold to proceed with c/s. NST form 03/27/12 is reactive with baseline of 140 and moderate variability.

## 2012-04-01 ENCOUNTER — Other Ambulatory Visit: Payer: Self-pay | Admitting: *Deleted

## 2012-04-01 DIAGNOSIS — O99810 Abnormal glucose complicating pregnancy: Secondary | ICD-10-CM

## 2012-04-01 DIAGNOSIS — O10019 Pre-existing essential hypertension complicating pregnancy, unspecified trimester: Secondary | ICD-10-CM

## 2012-04-01 NOTE — Addendum Note (Signed)
Addended by: Franchot Mimes on: 04/01/2012 10:47 AM   Modules accepted: Orders

## 2012-04-02 ENCOUNTER — Ambulatory Visit (INDEPENDENT_AMBULATORY_CARE_PROVIDER_SITE_OTHER): Payer: Self-pay | Admitting: *Deleted

## 2012-04-02 VITALS — BP 131/76 | Temp 98.7°F | Wt 267.9 lb

## 2012-04-02 DIAGNOSIS — O99810 Abnormal glucose complicating pregnancy: Secondary | ICD-10-CM

## 2012-04-02 DIAGNOSIS — O09299 Supervision of pregnancy with other poor reproductive or obstetric history, unspecified trimester: Secondary | ICD-10-CM

## 2012-04-02 DIAGNOSIS — O10019 Pre-existing essential hypertension complicating pregnancy, unspecified trimester: Secondary | ICD-10-CM

## 2012-04-02 LAB — CREATININE CLEARANCE, URINE, 24 HOUR
Creatinine, Urine: 100.7 mg/dL
Creatinine: 0.67 mg/dL (ref 0.50–1.10)

## 2012-04-02 LAB — POCT URINALYSIS DIP (DEVICE)
Bilirubin Urine: NEGATIVE
Hgb urine dipstick: NEGATIVE
Ketones, ur: NEGATIVE mg/dL
Protein, ur: 100 mg/dL — AB
Specific Gravity, Urine: 1.025 (ref 1.005–1.030)
pH: 6.5 (ref 5.0–8.0)

## 2012-04-02 NOTE — Progress Notes (Signed)
P-89 

## 2012-04-02 NOTE — Progress Notes (Signed)
NST reviewed and reactive.  

## 2012-04-06 ENCOUNTER — Ambulatory Visit (HOSPITAL_COMMUNITY)
Admission: RE | Admit: 2012-04-06 | Discharge: 2012-04-06 | Disposition: A | Discharge status: Home / Self Care | Payer: Self-pay | Source: Ambulatory Visit | Attending: Family Medicine | Admitting: Family Medicine

## 2012-04-06 ENCOUNTER — Encounter: Payer: Self-pay | Admitting: Family Medicine

## 2012-04-06 ENCOUNTER — Ambulatory Visit (INDEPENDENT_AMBULATORY_CARE_PROVIDER_SITE_OTHER): Payer: Self-pay | Admitting: Family Medicine

## 2012-04-06 VITALS — BP 137/91 | Temp 96.8°F | Wt 272.0 lb

## 2012-04-06 DIAGNOSIS — O09299 Supervision of pregnancy with other poor reproductive or obstetric history, unspecified trimester: Secondary | ICD-10-CM | POA: Insufficient documentation

## 2012-04-06 DIAGNOSIS — R319 Hematuria, unspecified: Secondary | ICD-10-CM

## 2012-04-06 DIAGNOSIS — B3731 Acute candidiasis of vulva and vagina: Secondary | ICD-10-CM

## 2012-04-06 DIAGNOSIS — O139 Gestational [pregnancy-induced] hypertension without significant proteinuria, unspecified trimester: Secondary | ICD-10-CM

## 2012-04-06 DIAGNOSIS — O98519 Other viral diseases complicating pregnancy, unspecified trimester: Secondary | ICD-10-CM | POA: Insufficient documentation

## 2012-04-06 DIAGNOSIS — O10019 Pre-existing essential hypertension complicating pregnancy, unspecified trimester: Secondary | ICD-10-CM | POA: Insufficient documentation

## 2012-04-06 DIAGNOSIS — O99810 Abnormal glucose complicating pregnancy: Secondary | ICD-10-CM | POA: Insufficient documentation

## 2012-04-06 DIAGNOSIS — O09529 Supervision of elderly multigravida, unspecified trimester: Secondary | ICD-10-CM | POA: Insufficient documentation

## 2012-04-06 DIAGNOSIS — A6000 Herpesviral infection of urogenital system, unspecified: Secondary | ICD-10-CM | POA: Insufficient documentation

## 2012-04-06 DIAGNOSIS — O34219 Maternal care for unspecified type scar from previous cesarean delivery: Secondary | ICD-10-CM | POA: Insufficient documentation

## 2012-04-06 DIAGNOSIS — Z8751 Personal history of pre-term labor: Secondary | ICD-10-CM | POA: Insufficient documentation

## 2012-04-06 DIAGNOSIS — B373 Candidiasis of vulva and vagina: Secondary | ICD-10-CM

## 2012-04-06 LAB — CBC
MCH: 22.1 pg — ABNORMAL LOW (ref 26.0–34.0)
Platelets: 239 10*3/uL (ref 150–400)
RBC: 5.42 MIL/uL — ABNORMAL HIGH (ref 3.87–5.11)

## 2012-04-06 LAB — POCT URINALYSIS DIP (DEVICE)
Ketones, ur: NEGATIVE mg/dL
Protein, ur: 100 mg/dL — AB
Specific Gravity, Urine: 1.02 (ref 1.005–1.030)
Urobilinogen, UA: 0.2 mg/dL (ref 0.0–1.0)
pH: 7 (ref 5.0–8.0)

## 2012-04-06 LAB — COMPREHENSIVE METABOLIC PANEL
Albumin: 3.5 g/dL (ref 3.5–5.2)
Alkaline Phosphatase: 83 U/L (ref 39–117)
CO2: 23 mEq/L (ref 19–32)
Calcium: 9.8 mg/dL (ref 8.4–10.5)
Chloride: 104 mEq/L (ref 96–112)
Glucose, Bld: 117 mg/dL — ABNORMAL HIGH (ref 70–99)
Potassium: 4.3 mEq/L (ref 3.5–5.3)
Sodium: 135 mEq/L (ref 135–145)
Total Protein: 6.4 g/dL (ref 6.0–8.3)

## 2012-04-06 MED ORDER — FLUCONAZOLE 150 MG PO TABS: 150.0000 mg | ORAL_TABLET | Freq: Every day | ORAL | Status: DC

## 2012-04-06 NOTE — Patient Instructions (Signed)
Gestational Diabetes Mellitus Gestational diabetes mellitus (GDM) is diabetes that occurs only during pregnancy. This happens when the body cannot properly handle the glucose (sugar) that increases in the blood after eating. During pregnancy, insulin resistance (reduced sensitivity to insulin) occurs because of the release of hormones from the placenta. Usually, the pancreas of pregnant women produces enough insulin to overcome the resistance that occurs. However, in gestational diabetes, the insulin is there but it does not work effectively. If the resistance is severe enough that the pancreas does not produce enough insulin, extra glucose builds up in the blood.  WHO IS AT RISK FOR DEVELOPING GESTATIONAL DIABETES?  Women with a history of diabetes in the family.  Women over age 25.  Women who are overweight.  Women in certain ethnic groups (Hispanic, African American, Native American, Asian and Pacific Islander). WHAT CAN HAPPEN TO THE BABY? If the mother's blood glucose is too high while she is pregnant, the extra sugar will travel through the umbilical cord to the baby. Some of the problems the baby may have are:  Large Baby - If the baby receives too much sugar, the baby will gain more weight. This may cause the baby to be too large to be born normally (vaginally) and a Cesarean section (C-section) may be needed.  Low Blood Glucose (hypoglycemia)  The baby makes extra insulin, in response to the extra sugar its gets from its mother. When the baby is born and no longer needs this extra insulin, the baby's blood glucose level may drop.  Jaundice (yellow coloring of the skin and eyes)  This is fairly common in babies. It is caused from a build-up of the chemical called bilirubin. This is rarely serious, but is seen more often in babies whose mothers had gestational diabetes. RISKS TO THE MOTHER Women who have had gestational diabetes may be at higher risk for some problems,  including:  Preeclampsia or toxemia, which includes problems with high blood pressure. Blood pressure and protein levels in the urine must be checked frequently.  Infections.  Cesarean section (C-section) for delivery.  Developing Type 2 diabetes later in life. About 30-50% will develop diabetes later, especially if obese. DIAGNOSIS  The hormones that cause insulin resistance are highest at about 24-28 weeks of pregnancy. If symptoms are experienced, they are much like symptoms you would normally expect during pregnancy.  GDM is often diagnosed using a two part method: 1. After 24-28 weeks of pregnancy, the woman drinks a glucose solution and takes a blood test. If the glucose level is high, a second test will be given. 2. Oral Glucose Tolerance Test (OGTT) which is 3 hours long  After not eating overnight, the blood glucose is checked. The woman drinks a glucose solution, and hourly blood glucose tests are taken. If the woman has risk factors for GDM, the caregiver may test earlier than 24 weeks of pregnancy. TREATMENT  Treatment of GDM is directed at keeping the mother's blood glucose level normal, and may include:  Meal planning.  Taking insulin or other medicine to control your blood glucose level.  Exercise.  Keeping a daily record of the foods you eat.  Blood glucose monitoring and keeping a record of your blood glucose levels.  May monitor ketone levels in the urine, although this is no longer considered necessary in most pregnancies. HOME CARE INSTRUCTIONS  While you are pregnant:  Follow your caregiver's advice regarding your prenatal appointments, meal planning, exercise, medicines, vitamins, blood and other tests, and physical   activities.  Keep a record of your meals, blood glucose tests, and the amount of insulin you are taking (if any). Show this to your caregiver at every prenatal visit.  If you have GDM, you may have problems with hypoglycemia (low blood glucose).  You may suspect this if you become suddenly dizzy, feel shaky, and/or weak. If you think this is happening and you have a glucose meter, try to test your blood glucose level. Follow your caregiver's advice for when and how to treat your low blood glucose. Generally, the 15:15 rule is followed: Treat by consuming 15 grams of carbohydrates, wait 15 minutes, and recheck blood glucose. Examples of 15 grams of carbohydrates are:  1 cup skim or low-fat milk.   cup juice.  3-4 glucose tablets.  5-6 hard candies.  1 small box raisins.   cup regular soda pop.  Practice good hygiene, to avoid infections.  Do not smoke. SEEK MEDICAL CARE IF:   You develop abnormal vaginal discharge, with or without itching.  You become weak and tired more than expected.  You seem to sweat a lot.  You have a sudden increase in weight, 5 pounds or more in one week.  You are losing weight, 3 pounds or more in a week.  Your blood glucose level is high, and you need instructions on what to do about it. SEEK IMMEDIATE MEDICAL CARE IF:   You develop a severe headache.  You faint or pass out.  You develop nausea and vomiting.  You become disoriented or confused.  You have a convulsion.  You develop vision problems.  You develop stomach pain.  You develop vaginal bleeding.  You develop uterine contractions.  You have leaking or a gush of fluid from the vagina. AFTER YOU HAVE THE BABY:  Go to all of your follow-up appointments, and have blood tests as advised by your caregiver.  Maintain a healthy lifestyle, to prevent diabetes in the future. This includes:  Following a healthy meal plan.  Controlling your weight.  Getting enough exercise and proper rest.  Do not smoke.  Breastfeed your baby if you can. This will lower the chance of you and your baby developing diabetes later in life. For more information about diabetes, go to the American Diabetes Association at:  www.americandiabetesassociation.org. For more information about gestational diabetes, go to the American Congress of Obstetricians and Gynecologists at: www.acog.org. Document Released: 08/19/2000 Document Revised: 08/05/2011 Document Reviewed: 03/13/2009 ExitCare Patient Information 2013 [employer].  Preeclampsia and Eclampsia Preeclampsia is a condition of high blood pressure during pregnancy. It can happen at 20 weeks or later in pregnancy. If high blood pressure occurs in the second half of pregnancy with no other symptoms, it is called gestational hypertension and goes away after the baby is born. If any of the symptoms listed below develop with gestational hypertension, it is then called preeclampsia. Eclampsia (convulsions) may follow preeclampsia. This is one of the reasons for regular prenatal checkups. Early diagnosis and treatment are very important to prevent eclampsia. CAUSES  There is no known cause of preeclampsia/eclampsia in pregnancy. There are several known conditions that may put the pregnant woman at risk, such as:  The first pregnancy.  Having preeclampsia in a past pregnancy.  Having lasting (chronic) high blood pressure.  Having multiples (twins, triplets).  Being age 35 or older.  African American ethnic background.  Having kidney disease or diabetes.  Medical conditions such as lupus or blood diseases.  Being overweight (obese). SYMPTOMS     High blood pressure.  Headaches.  Sudden weight gain.  Swelling of hands, face, legs, and feet.  Protein in the urine.  Feeling sick to your stomach (nauseous) and throwing up (vomiting).  Vision problems (blurred or double vision).  Numbness in the face, arms, legs, and feet.  Dizziness.  Slurred speech.  Preeclampsia can cause growth retardation in the fetus.  Separation (abruption) of the placenta.  Not enough fluid in the amniotic sac (oligohydramnios).  Sensitivity to bright lights.  Belly  (abdominal) pain. DIAGNOSIS  If protein is found in the urine in the second half of pregnancy, this is considered preeclampsia. Other symptoms mentioned above may also be present. TREATMENT  It is necessary to treat this.  Your caregiver may prescribe bed rest early in this condition. Plenty of rest and salt restriction may be all that is needed.  Medicines may be necessary to lower blood pressure if the condition does not respond to more conservative measures.  In more severe cases, hospitalization may be needed:  For treatment of blood pressure.  To control fluid retention.  To monitor the baby to see if the condition is causing harm to the baby.  Hospitalization is the best way to treat the first sign of preeclampsia. This is so the mother and baby can be watched closely and blood tests can be done effectively and correctly.  If the condition becomes severe, it may be necessary to induce labor or to remove the infant by surgical means (cesarean section). The best cure for preeclampsia/eclampsia is to deliver the baby. Preeclampsia and eclampsia involve risks to mother and infant. Your caregiver will discuss these risks with you. Together, you can work out the best possible approach to your problems. Make sure you keep your prenatal visits as scheduled. Not keeping appointments could result in a chronic or permanent injury, pain, disability to you, and death or injury to you or your unborn baby. If there is any problem keeping the appointment, you must call to reschedule. HOME CARE INSTRUCTIONS   Keep your prenatal appointments and tests as scheduled.  Tell your caregiver if you have any of the above risk factors.  Get plenty of rest and sleep.  Eat a balanced diet that is low in salt, and do not add salt to your food.  Avoid stressful situations.  Only take over-the-counter and prescriptions medicines for pain, discomfort, or fever as directed by your caregiver. SEEK IMMEDIATE  MEDICAL CARE IF:   You develop severe swelling anywhere in the body. This usually occurs in the legs.  You gain 5 lb/2.3 kg or more in a week.  You develop a severe headache, dizziness, problems with your vision, or confusion.  You have abdominal pain, nausea, or vomiting.  You have a seizure.  You have trouble moving any part of your body, or you develop numbness or problems speaking.  You have bruising or abnormal bleeding from anywhere in the body.  You develop a stiff neck.  You pass out. MAKE SURE YOU:   Understand these instructions.  Will watch your condition.  Will get help right away if you are not doing well or get worse. Document Released: 05/10/2000 Document Revised: 08/05/2011 Document Reviewed: 12/25/2007 ExitCare Patient Information 2013 [employer].  Breastfeeding Deciding to breastfeed is one of the best choices you can make for you and your baby. The information that follows gives a brief overview of the benefits of breastfeeding as well as common topics surrounding breastfeeding. BENEFITS OF BREASTFEEDING For the   baby  The first milk (colostrum) helps the baby's digestive system function better.   There are antibodies in the mother's milk that help the baby fight off infections.   The baby has a lower incidence of asthma, allergies, and sudden infant death syndrome (SIDS).   The nutrients in breast milk are better for the baby than infant formulas, and breast milk helps the baby's brain grow better.   Babies who breastfeed have less gas, colic, and constipation.  For the mother  Breastfeeding helps develop a very special bond between the mother and her baby.   Breastfeeding is convenient, always available at the correct temperature, and costs nothing.   Breastfeeding burns calories in the mother and helps her lose weight that was gained during pregnancy.   Breastfeeding makes the uterus contract back down to normal size faster and  slows bleeding following delivery.   Breastfeeding mothers have a lower risk of developing breast cancer.  BREASTFEEDING FREQUENCY  A healthy, full-term baby may breastfeed as often as every hour or space his or her feedings to every 3 hours.   Watch your baby for signs of hunger. Nurse your baby if he or she shows signs of hunger. How often you nurse will vary from baby to baby.   Nurse as often as the baby requests, or when you feel the need to reduce the fullness of your breasts.   Awaken the baby if it has been 3 4 hours since the last feeding.   Frequent feeding will help the mother make more milk and will help prevent problems, such as sore nipples and engorgement of the breasts.  BABY'S POSITION AT THE BREAST  Whether lying down or sitting, be sure that the baby's tummy is facing your tummy.   Support the breast with 4 fingers underneath the breast and the thumb above. Make sure your fingers are well away from the nipple and baby's mouth.   Stroke the baby's lips gently with your finger or nipple.   When the baby's mouth is open wide enough, place all of your nipple and as much of the areola as possible into your baby's mouth.   Pull the baby in close so the tip of the nose and the baby's cheeks touch the breast during the feeding.  FEEDINGS AND SUCTION  The length of each feeding varies from baby to baby and from feeding to feeding.   The baby must suck about 2 3 minutes for your milk to get to him or her. This is called a "let down." For this reason, allow the baby to feed on each breast as long as he or she wants. Your baby will end the feeding when he or she has received the right balance of nutrients.   To break the suction, put your finger into the corner of the baby's mouth and slide it between his or her gums before removing your breast from his or her mouth. This will help prevent sore nipples.  HOW TO TELL WHETHER YOUR BABY IS GETTING ENOUGH BREAST  MILK. Wondering whether or not your baby is getting enough milk is a common concern among mothers. You can be assured that your baby is getting enough milk if:   Your baby is actively sucking and you hear swallowing.   Your baby seems relaxed and satisfied after a feeding.   Your baby nurses at least 8 12 times in a 24 hour time period. Nurse your baby until he or she unlatches or falls   asleep at the first breast (at least 10 20 minutes), then offer the second side.   Your baby is wetting 5 6 disposable diapers (6 8 cloth diapers) in a 24 hour period by 5 6 days of age.   Your baby is having at least 3 4 stools every 24 hours for the first 6 weeks. The stool should be soft and yellow.   Your baby should gain 4 7 ounces per week after he or she is 4 days old.   Your breasts feel softer after nursing.  REDUCING BREAST ENGORGEMENT  In the first week after your baby is born, you may experience signs of breast engorgement. When breasts are engorged, they feel heavy, warm, full, and may be tender to the touch. You can reduce engorgement if you:   Nurse frequently, every 2 3 hours. Mothers who breastfeed early and often have fewer problems with engorgement.   Place light ice packs on your breasts for 10 20 minutes between feedings. This reduces swelling. Wrap the ice packs in a lightweight towel to protect your skin. Bags of frozen vegetables work well for this purpose.   Take a warm shower or apply warm, moist heat to your breast for 5 10 minutes just before each feeding. This increases circulation and helps the milk flow.   Gently massage your breast before and during the feeding. Using your finger tips, massage from the chest wall towards your nipple in a circular motion.   Make sure that the baby empties at least one breast at every feeding before switching sides.   Use a breast pump to empty the breasts if your baby is sleepy or not nursing well. You may also want to pump if  you are returning to work oryou feel you are getting engorged.   Avoid bottle feeds, pacifiers, or supplemental feedings of water or juice in place of breastfeeding. Breast milk is all the food your baby needs. It is not necessary for your baby to have water or formula. In fact, to help your breasts make more milk, it is best not to give your baby supplemental feedings during the early weeks.   Be sure the baby is latched on and positioned properly while breastfeeding.   Wear a supportive bra, avoiding underwire styles.   Eat a balanced diet with enough fluids.   Rest often, relax, and take your prenatal vitamins to prevent fatigue, stress, and anemia.  If you follow these suggestions, your engorgement should improve in 24 48 hours. If you are still experiencing difficulty, call your lactation consultant or caregiver.  CARING FOR YOURSELF Take care of your breasts  Bathe or shower daily.   Avoid using soap on your nipples.   Start feedings on your left breast at one feeding and on your right breast at the next feeding.   You will notice an increase in your milk supply 2 5 days after delivery. You may feel some discomfort from engorgement, which makes your breasts very firm and often tender. Engorgement "peaks" out within 24 48 hours. In the meantime, apply warm moist towels to your breasts for 5 10 minutes before feeding. Gentle massage and expression of some milk before feeding will soften your breasts, making it easier for your baby to latch on.   Wear a well-fitting nursing bra, and air dry your nipples for a 3 4minutes after each feeding.   Only use cotton bra pads.   Only use pure lanolin on your nipples after nursing. You do   not need to wash it off before feeding the baby again. Another option is to express a few drops of breast milk and gently massage it into your nipples.  Take care of yourself  Eat well-balanced meals and nutritious snacks.   Drinking milk,  fruit juice, and water to satisfy your thirst (about 8 glasses a day).   Get plenty of rest.  Avoid foods that you notice affect the baby in a bad way.  SEEK MEDICAL CARE IF:   You have difficulty with breastfeeding and need help.   You have a hard, red, sore area on your breast that is accompanied by a fever.   Your baby is too sleepy to eat well or is having trouble sleeping.   Your baby is wetting less than 6 diapers a day, by 5 days of age.   Your baby's skin or white part of his or her eyes is more yellow than it was in the hospital.   You feel depressed.  Document Released: 05/13/2005 Document Revised: 11/12/2011 Document Reviewed: 08/11/2011 ExitCare Patient Information 2013 [employer].  

## 2012-04-06 NOTE — Progress Notes (Signed)
Has proteinuria today and small blood--will check culture and Pr/Cr ratio.  Other blood work. C/o headache. No book.  Declines NST today. Reports pain and pressure.

## 2012-04-06 NOTE — Progress Notes (Signed)
Pulse 95  2ndBP:136/ 89 Edema trace in hands. C/o vaginal sharp pain and pressure; c/o headaches.

## 2012-04-06 NOTE — Progress Notes (Signed)
Diabetes:  Provided 1 box strips, Lot: [id number] Exp: 2014/05/12 Maggie Porche Steinberger, RN, RD, CDE

## 2012-04-07 ENCOUNTER — Encounter: Payer: Self-pay | Admitting: Family Medicine

## 2012-04-07 DIAGNOSIS — IMO0002 Reserved for concepts with insufficient information to code with codable children: Secondary | ICD-10-CM | POA: Insufficient documentation

## 2012-04-07 LAB — PROTEIN / CREATININE RATIO, URINE
Creatinine, Urine: 162.3 mg/dL
Total Protein, Urine: 54 mg/dL

## 2012-04-09 ENCOUNTER — Encounter (HOSPITAL_COMMUNITY): Payer: Self-pay | Admitting: Pharmacist

## 2012-04-09 NOTE — Progress Notes (Signed)
NST 03-20-12 reactive  

## 2012-04-09 NOTE — Progress Notes (Signed)
NST 03-20-12 reactive

## 2012-04-10 ENCOUNTER — Ambulatory Visit (INDEPENDENT_AMBULATORY_CARE_PROVIDER_SITE_OTHER): Payer: Self-pay | Admitting: *Deleted

## 2012-04-10 VITALS — BP 145/95 | Wt 272.7 lb

## 2012-04-10 DIAGNOSIS — O09299 Supervision of pregnancy with other poor reproductive or obstetric history, unspecified trimester: Secondary | ICD-10-CM

## 2012-04-10 DIAGNOSIS — O10019 Pre-existing essential hypertension complicating pregnancy, unspecified trimester: Secondary | ICD-10-CM

## 2012-04-10 DIAGNOSIS — O99810 Abnormal glucose complicating pregnancy: Secondary | ICD-10-CM

## 2012-04-10 LAB — CULTURE, OB URINE

## 2012-04-10 MED ORDER — CIPROFLOXACIN HCL 500 MG PO TABS: 500.0000 mg | ORAL_TABLET | Freq: Two times a day (BID) | ORAL | Status: DC

## 2012-04-10 NOTE — Progress Notes (Signed)
P = 95    1 variable decel during NST- pt positioned to Lt tilt; FHR reactive after decel.  Pt had BPP 8/8 and AFI 13.8 on 04/06/12.   Pt reports some H/A's- encouraged to use Tylenol- may take 2 or more doses to relieve H/A.  Pt advised to return to hospital for severe H/A, visual disturbances, sx of labor or decr. FM.  Pt informed of +UTI and need for Rx- she will obtain from pharmacy today.

## 2012-04-11 ENCOUNTER — Inpatient Hospital Stay (HOSPITAL_COMMUNITY)
Admission: AD | Admit: 2012-04-11 | Discharge: 2012-04-11 | Disposition: A | Discharge status: Home / Self Care | Payer: Self-pay | Source: Ambulatory Visit | Attending: Obstetrics and Gynecology | Admitting: Obstetrics and Gynecology

## 2012-04-11 ENCOUNTER — Encounter (HOSPITAL_COMMUNITY): Payer: Self-pay | Admitting: *Deleted

## 2012-04-11 ENCOUNTER — Inpatient Hospital Stay (HOSPITAL_COMMUNITY): Admit: 2012-04-11 | Payer: Self-pay | Source: Ambulatory Visit | Admitting: Obstetrics and Gynecology

## 2012-04-11 DIAGNOSIS — O99810 Abnormal glucose complicating pregnancy: Secondary | ICD-10-CM | POA: Insufficient documentation

## 2012-04-11 DIAGNOSIS — R519 Headache, unspecified: Secondary | ICD-10-CM

## 2012-04-11 DIAGNOSIS — O26899 Other specified pregnancy related conditions, unspecified trimester: Secondary | ICD-10-CM

## 2012-04-11 DIAGNOSIS — R51 Headache: Secondary | ICD-10-CM | POA: Insufficient documentation

## 2012-04-11 DIAGNOSIS — O10019 Pre-existing essential hypertension complicating pregnancy, unspecified trimester: Secondary | ICD-10-CM | POA: Insufficient documentation

## 2012-04-11 LAB — URINALYSIS, ROUTINE W REFLEX MICROSCOPIC
Bilirubin Urine: NEGATIVE
Glucose, UA: NEGATIVE mg/dL
Hgb urine dipstick: NEGATIVE
Ketones, ur: NEGATIVE mg/dL
Nitrite: NEGATIVE
Specific Gravity, Urine: 1.015 (ref 1.005–1.030)
pH: 6.5 (ref 5.0–8.0)

## 2012-04-11 MED ORDER — CYCLOBENZAPRINE HCL 10 MG PO TABS: 10.0000 mg | ORAL_TABLET | Freq: Once | ORAL | Status: DC

## 2012-04-11 MED ORDER — ACETAMINOPHEN 500 MG PO TABS
1000.0000 mg | ORAL_TABLET | Freq: Once | ORAL | Status: AC
  Administered 2012-04-11: 1000 mg via ORAL
  Filled 2012-04-11: qty 2

## 2012-04-11 MED ORDER — GI COCKTAIL ~~LOC~~
30.0000 mL | Freq: Once | ORAL | Status: AC
  Administered 2012-04-11: 30 mL via ORAL
  Filled 2012-04-11: qty 30

## 2012-04-11 NOTE — MAU Note (Signed)
Pt states she has high blood pressure nad is concerned about the heacache she has tht has not gone away after taking tylenol at 2330

## 2012-04-11 NOTE — MAU Provider Note (Signed)
History     CSN: [id number]  Arrival date and time: 04/11/12 0415   None     Chief Complaint  Patient presents with  . Headache   HPI 36 y.o. [id number] at [redacted]w[redacted]d with headache since yesterday, top and back of head. Has taken tylenol 1000 mg twice, last time 6 hours ago, with no relief. No vision changes except for some light sensitivity. No nausea/vomiting. Also has sharp chest pain that feels like indigestion. The patient states that she has been told she has pre-eclampsia this pregnancy and to come to hospital if she has headache not relieved by tylenol.  Patient has chronic HTN and is on Aldomet. Her recent BP in the office have been 130-150s/90s. 24 hour urine protein on 11/6 was 211. A protein/creatinine ratio on 11/11 was 0.33. She also has GDM and is on glyburide. She states that her BS have been "perfect"- less than 90 fasting and less than 120 after meals. She is being treated for a UTI. Has repeat c-section scheduled at 37 week. No contractions, bleeding, loss of fluid. Baby moving well.   OB history:  2 preterm twin deliveries. First at 32 weeks, c-section for IUFD of one twin. Second pregnancy 26 week c-section for IUGR of one twin - had GDM and Pre-E in second pregnancy.   OB History    Grav Para Term Preterm Abortions TAB SAB Ect Mult Living   4 2  2  0 0 0 0 2 3      Past Medical History  Diagnosis Date  . Herpes genitalia     2013 - 1st outbreak  . Trichomonas   . Twins   . Hypertension     2008  . Gestational diabetes     Neg 2 hr test after preg    Past Surgical History  Procedure Date  . Cesarean section     X 2    Family History  Problem Relation Age of Onset  . Hypertension Mother   . Stroke Maternal Grandmother   . Heart disease Maternal Grandfather     History  Substance Use Topics  . Smoking status: Former Smoker -- 0.5 packs/day    Types: Cigarettes    Quit date: 10/11/2011  . Smokeless tobacco: Never Used  . Alcohol Use: No     Allergies:  Allergies  Allergen Reactions  . Penicillins Hives, Shortness Of Breath and Swelling    Prescriptions prior to admission  Medication Sig Dispense Refill  . acyclovir (ZOVIRAX) 200 MG capsule Take 200 mg by mouth 3 (three) times daily.      . ciprofloxacin (CIPRO) 500 MG tablet Take 1 tablet (500 mg total) by mouth 2 (two) times daily.  10 tablet  0  . glyBURIDE (DIABETA) 2.5 MG tablet Take 1 tablet (2.5 mg total) by mouth 2 (two) times daily with a meal.  60 tablet  3  . methyldopa (ALDOMET) 500 MG tablet Take 1 tablet (500 mg total) by mouth 2 (two) times daily.  60 tablet  3  . Prenatal Vit-Fe Fumarate-FA (PRENATAL MULTIVITAMIN) TABS Take 1 tablet by mouth daily.        Review of Systems  Constitutional: Negative for fever and chills.  Eyes: Positive for photophobia. Negative for blurred vision and double vision.  Respiratory: Negative for cough.   Cardiovascular: Positive for chest pain. Negative for palpitations.  Gastrointestinal: Negative for nausea, vomiting, abdominal pain, diarrhea and constipation.  Genitourinary: Negative for dysuria and urgency.  Neurological: Positive for  headaches. Negative for dizziness.   Physical Exam   Blood pressure 137/77, pulse 80, temperature 98.2 F (36.8 C), temperature source Oral, resp. rate 20, height 5\' 7"  (1.702 m), weight 124.399 kg (274 lb 4 oz), last menstrual period 08/06/2011, SpO2 100.00%.  Physical Exam  Constitutional: She is oriented to person, place, and time. She appears well-developed and well-nourished. No distress.  HENT:  Head: Normocephalic and atraumatic.  Eyes: Conjunctivae normal and EOM are normal.  Neck: Normal range of motion. Neck supple.  Cardiovascular: Normal rate, regular rhythm and normal heart sounds.   Respiratory: Effort normal and breath sounds normal. No respiratory distress.  GI: She exhibits no distension. There is no tenderness. There is no rebound and no guarding.   Musculoskeletal: Normal range of motion. She exhibits no edema and no tenderness.  Neurological: She is alert and oriented to person, place, and time.  Skin: Skin is warm and dry.    NST: 125-130, moderate variability, accels present, no decels TOCO:  Irritability  Results for orders placed during the hospital encounter of 04/11/12 (from the past 24 hour(s))  URINALYSIS, ROUTINE W REFLEX MICROSCOPIC     Status: Abnormal   Collection Time   04/11/12  4:20 AM      Component Value Range   Color, Urine YELLOW  YELLOW   APPearance HAZY (*) CLEAR   Specific Gravity, Urine 1.015  1.005 - 1.030   pH 6.5  5.0 - 8.0   Glucose, UA NEGATIVE  NEGATIVE mg/dL   Hgb urine dipstick NEGATIVE  NEGATIVE   Bilirubin Urine NEGATIVE  NEGATIVE   Ketones, ur NEGATIVE  NEGATIVE mg/dL   Protein, ur NEGATIVE  NEGATIVE mg/dL   Urobilinogen, UA 0.2  0.0 - 1.0 mg/dL   Nitrite NEGATIVE  NEGATIVE   Leukocytes, UA NEGATIVE  NEGATIVE  GLUCOSE, CAPILLARY     Status: Normal   Collection Time   04/11/12  5:33 AM      Component Value Range   Glucose-Capillary 84  70 - 99 mg/dL     MAU Course  Procedures  - tylenol - GI cocktail Headache and heartburn/chest pain resolving  Assessment and Plan  36 y.o. [id number] at [redacted]w[redacted]d with chronic HTN, GDM - Headache:  Tylenol, flexeril, heat/ice to neck/head - Heartburn - zantac if needed, tums - IUP - NST reactive. - HTN/Preeclampsia:  No proteinuria tonight, BP are normal to mild range - Discharge home, f/u in clinic Monday as scheduled.  Napoleon Form 04/11/2012, 4:47 AM

## 2012-04-13 ENCOUNTER — Ambulatory Visit (INDEPENDENT_AMBULATORY_CARE_PROVIDER_SITE_OTHER): Payer: Self-pay | Admitting: Obstetrics & Gynecology

## 2012-04-13 VITALS — BP 145/96 | Temp 97.6°F | Wt 273.6 lb

## 2012-04-13 DIAGNOSIS — O09529 Supervision of elderly multigravida, unspecified trimester: Secondary | ICD-10-CM

## 2012-04-13 DIAGNOSIS — O10019 Pre-existing essential hypertension complicating pregnancy, unspecified trimester: Secondary | ICD-10-CM

## 2012-04-13 DIAGNOSIS — O09219 Supervision of pregnancy with history of pre-term labor, unspecified trimester: Secondary | ICD-10-CM

## 2012-04-13 DIAGNOSIS — O1400 Mild to moderate pre-eclampsia, unspecified trimester: Secondary | ICD-10-CM

## 2012-04-13 DIAGNOSIS — IMO0002 Reserved for concepts with insufficient information to code with codable children: Secondary | ICD-10-CM

## 2012-04-13 DIAGNOSIS — O99810 Abnormal glucose complicating pregnancy: Secondary | ICD-10-CM

## 2012-04-13 DIAGNOSIS — O3490 Maternal care for abnormality of pelvic organ, unspecified, unspecified trimester: Secondary | ICD-10-CM

## 2012-04-13 DIAGNOSIS — O34219 Maternal care for unspecified type scar from previous cesarean delivery: Secondary | ICD-10-CM

## 2012-04-13 DIAGNOSIS — O0990 Supervision of high risk pregnancy, unspecified, unspecified trimester: Secondary | ICD-10-CM

## 2012-04-13 DIAGNOSIS — E669 Obesity, unspecified: Secondary | ICD-10-CM

## 2012-04-13 DIAGNOSIS — O99210 Obesity complicating pregnancy, unspecified trimester: Secondary | ICD-10-CM

## 2012-04-13 LAB — POCT URINALYSIS DIP (DEVICE)
Bilirubin Urine: NEGATIVE
Glucose, UA: NEGATIVE mg/dL
Ketones, ur: NEGATIVE mg/dL
Nitrite: NEGATIVE
Specific Gravity, Urine: 1.02 (ref 1.005–1.030)
pH: 7 (ref 5.0–8.0)

## 2012-04-13 MED ORDER — METHYLDOPA 500 MG PO TABS: 1000.0000 mg | ORAL_TABLET | Freq: Two times a day (BID) | ORAL | Status: DC

## 2012-04-13 NOTE — Progress Notes (Signed)
Increase BP med to Aldomet 1000mg  bid.  CBGs 19-97 fasting; all postprandials are <121. C/S scheduled for next Tuesday.  Cultures done.  NST reactive today.

## 2012-04-13 NOTE — Progress Notes (Signed)
NST reviewed and reactive.  

## 2012-04-13 NOTE — Progress Notes (Signed)
Pulse 92 Patient reports some pelvic pressure and occasional night-time UC's

## 2012-04-14 LAB — GC/CHLAMYDIA PROBE AMP, URINE
Chlamydia, Swab/Urine, PCR: NEGATIVE
GC Probe Amp, Urine: NEGATIVE

## 2012-04-17 ENCOUNTER — Encounter (HOSPITAL_COMMUNITY)
Admission: RE | Admit: 2012-04-17 | Discharge: 2012-04-17 | Disposition: A | Discharge status: Home / Self Care | Payer: Self-pay | Source: Ambulatory Visit | Attending: Family Medicine | Admitting: Family Medicine

## 2012-04-17 ENCOUNTER — Ambulatory Visit (INDEPENDENT_AMBULATORY_CARE_PROVIDER_SITE_OTHER): Payer: Self-pay | Admitting: *Deleted

## 2012-04-17 ENCOUNTER — Encounter (HOSPITAL_COMMUNITY): Payer: Self-pay | Admitting: *Deleted

## 2012-04-17 ENCOUNTER — Encounter (HOSPITAL_COMMUNITY): Payer: Self-pay

## 2012-04-17 VITALS — BP 146/82 | Wt 279.8 lb

## 2012-04-17 VITALS — BP 132/92 | HR 87 | Resp 20 | Ht 67.0 in | Wt 282.0 lb

## 2012-04-17 DIAGNOSIS — A6009 Herpesviral infection of other urogenital tract: Secondary | ICD-10-CM

## 2012-04-17 DIAGNOSIS — O09529 Supervision of elderly multigravida, unspecified trimester: Secondary | ICD-10-CM

## 2012-04-17 DIAGNOSIS — O09299 Supervision of pregnancy with other poor reproductive or obstetric history, unspecified trimester: Secondary | ICD-10-CM

## 2012-04-17 DIAGNOSIS — IMO0002 Reserved for concepts with insufficient information to code with codable children: Secondary | ICD-10-CM

## 2012-04-17 DIAGNOSIS — I10 Essential (primary) hypertension: Secondary | ICD-10-CM

## 2012-04-17 DIAGNOSIS — O1400 Mild to moderate pre-eclampsia, unspecified trimester: Secondary | ICD-10-CM

## 2012-04-17 DIAGNOSIS — O34219 Maternal care for unspecified type scar from previous cesarean delivery: Secondary | ICD-10-CM

## 2012-04-17 DIAGNOSIS — N83209 Unspecified ovarian cyst, unspecified side: Secondary | ICD-10-CM

## 2012-04-17 DIAGNOSIS — O0990 Supervision of high risk pregnancy, unspecified, unspecified trimester: Secondary | ICD-10-CM

## 2012-04-17 DIAGNOSIS — Z8759 Personal history of other complications of pregnancy, childbirth and the puerperium: Secondary | ICD-10-CM

## 2012-04-17 DIAGNOSIS — O10019 Pre-existing essential hypertension complicating pregnancy, unspecified trimester: Secondary | ICD-10-CM

## 2012-04-17 DIAGNOSIS — O99210 Obesity complicating pregnancy, unspecified trimester: Secondary | ICD-10-CM

## 2012-04-17 HISTORY — DX: Anemia, unspecified: D64.9

## 2012-04-17 HISTORY — DX: Other seasonal allergic rhinitis: J30.2

## 2012-04-17 HISTORY — DX: Unspecified pre-eclampsia, third trimester: O14.93

## 2012-04-17 HISTORY — DX: Headache: R51

## 2012-04-17 HISTORY — DX: Shortness of breath: R06.02

## 2012-04-17 LAB — BASIC METABOLIC PANEL
CO2: 21 mEq/L (ref 19–32)
Chloride: 102 mEq/L (ref 96–112)
Creatinine, Ser: 0.66 mg/dL (ref 0.50–1.10)
GFR calc Af Amer: >90 mL/min (ref >90)
Sodium: 133 mEq/L — ABNORMAL LOW (ref 135–145)

## 2012-04-17 LAB — TYPE AND SCREEN
ABO/RH(D): A POS
Antibody Screen: NEGATIVE

## 2012-04-17 LAB — CBC
HCT: 35.2 % — ABNORMAL LOW (ref 36.0–46.0)
MCV: 69.8 fL — ABNORMAL LOW (ref 78.0–100.0)
Platelets: 163 10*3/uL (ref 150–400)
RBC: 5.04 MIL/uL (ref 3.87–5.11)
RDW: 17.3 % — ABNORMAL HIGH (ref 11.5–15.5)
WBC: 7.2 10*3/uL (ref 4.0–10.5)

## 2012-04-17 LAB — SURGICAL PCR SCREEN: Staphylococcus aureus: NEGATIVE

## 2012-04-17 MED ORDER — AZITHROMYCIN 250 MG PO TABS: ORAL_TABLET | ORAL | Status: DC

## 2012-04-17 NOTE — Progress Notes (Signed)
NST performed today was reviewed and was found to be reactive.  Continue recommended antenatal testing and prenatal care.  

## 2012-04-17 NOTE — Progress Notes (Signed)
P = 85    Pt is scheduled for repeat C/S on 04/21/12

## 2012-04-17 NOTE — Patient Instructions (Addendum)
   Your procedure is scheduled on: Tuesday, Nov 26 at 945am  Enter through the [employer] [facility] at: 815am Pick up the phone at the desk and dial [phone] and inform us of your arrival.  Please call this number if you have any problems the morning of surgery: [phone]  Remember: Do not eat food after midnight: Monday Do not drink clear liquids after: Monday Take these medicines the morning of surgery with a SIP OF WATER: zovirax, aldomet, zantac.  Patient to withhold Monday night dose of Glyburide  Do not wear jewelry, make-up, or FINGER nail polish No metal in your hair or on your body. Do not wear lotions, powders, perfumes. You may wear deodorant.  Please use your CHG wash as directed prior to surgery.  Do not shave anywhere for at least 12 hours prior to first CHG shower.  Do not bring valuables to the hospital. Contacts, dentures or bridgework may not be worn into surgery.  Leave suitcase in the car. After Surgery it may be brought to your room. For patients being admitted to the hospital, checkout time is 11:00am the day of discharge.  Home wit husband Kelvin.

## 2012-04-17 NOTE — Addendum Note (Signed)
Addended by: Jaynie Collins A on: 04/17/2012 11:55 AM   Modules accepted: Orders

## 2012-04-18 ENCOUNTER — Other Ambulatory Visit: Payer: Self-pay | Admitting: Obstetrics & Gynecology

## 2012-04-20 MED ORDER — GENTAMICIN SULFATE 40 MG/ML IJ SOLN
INTRAVENOUS | Status: AC
  Administered 2012-04-21: 100 mL via INTRAVENOUS
  Filled 2012-04-20: qty 10.25

## 2012-04-21 ENCOUNTER — Encounter (HOSPITAL_COMMUNITY)
Admission: RE | Disposition: A | Discharge status: Home / Self Care | Payer: Self-pay | Source: Ambulatory Visit | Attending: Family Medicine

## 2012-04-21 ENCOUNTER — Encounter (HOSPITAL_COMMUNITY): Payer: Self-pay | Admitting: *Deleted

## 2012-04-21 ENCOUNTER — Encounter (HOSPITAL_COMMUNITY): Payer: Self-pay | Admitting: Anesthesiology

## 2012-04-21 ENCOUNTER — Inpatient Hospital Stay (HOSPITAL_COMMUNITY): Payer: Self-pay | Admitting: Anesthesiology

## 2012-04-21 ENCOUNTER — Inpatient Hospital Stay (HOSPITAL_COMMUNITY)
Admission: RE | Admit: 2012-04-21 | Discharge: 2012-04-23 | DRG: 765 | Disposition: A | Discharge status: Home / Self Care | Payer: MEDICAID | Source: Ambulatory Visit | Attending: Family Medicine | Admitting: Family Medicine

## 2012-04-21 DIAGNOSIS — O10019 Pre-existing essential hypertension complicating pregnancy, unspecified trimester: Secondary | ICD-10-CM

## 2012-04-21 DIAGNOSIS — N83209 Unspecified ovarian cyst, unspecified side: Secondary | ICD-10-CM

## 2012-04-21 DIAGNOSIS — O34219 Maternal care for unspecified type scar from previous cesarean delivery: Secondary | ICD-10-CM

## 2012-04-21 DIAGNOSIS — O1002 Pre-existing essential hypertension complicating childbirth: Secondary | ICD-10-CM | POA: Diagnosis present

## 2012-04-21 DIAGNOSIS — O99210 Obesity complicating pregnancy, unspecified trimester: Secondary | ICD-10-CM

## 2012-04-21 DIAGNOSIS — O0990 Supervision of high risk pregnancy, unspecified, unspecified trimester: Secondary | ICD-10-CM

## 2012-04-21 DIAGNOSIS — A6009 Herpesviral infection of other urogenital tract: Secondary | ICD-10-CM

## 2012-04-21 DIAGNOSIS — I10 Essential (primary) hypertension: Secondary | ICD-10-CM

## 2012-04-21 DIAGNOSIS — O1400 Mild to moderate pre-eclampsia, unspecified trimester: Secondary | ICD-10-CM

## 2012-04-21 DIAGNOSIS — O99814 Abnormal glucose complicating childbirth: Secondary | ICD-10-CM

## 2012-04-21 DIAGNOSIS — O09529 Supervision of elderly multigravida, unspecified trimester: Secondary | ICD-10-CM

## 2012-04-21 DIAGNOSIS — O09299 Supervision of pregnancy with other poor reproductive or obstetric history, unspecified trimester: Secondary | ICD-10-CM

## 2012-04-21 LAB — GLUCOSE, CAPILLARY: Glucose-Capillary: 84 mg/dL (ref 70–99)

## 2012-04-21 LAB — PREPARE RBC (CROSSMATCH)

## 2012-04-21 SURGERY — Surgical Case
Anesthesia: Spinal | Site: Abdomen | Wound class: Clean Contaminated

## 2012-04-21 MED ORDER — PRENATAL MULTIVITAMIN CH
1.0000 | ORAL_TABLET | Freq: Every day | ORAL | Status: DC
  Administered 2012-04-22 – 2012-04-23 (×2): 1 via ORAL
  Filled 2012-04-21 (×2): qty 1

## 2012-04-21 MED ORDER — ONDANSETRON HCL 4 MG/2ML IJ SOLN
INTRAMUSCULAR | Status: AC
Start: 2012-04-21 — End: 2012-04-21
  Filled 2012-04-21: qty 2

## 2012-04-21 MED ORDER — BUPIVACAINE IN DEXTROSE 0.75-8.25 % IT SOLN
INTRATHECAL | Status: DC | PRN
  Administered 2012-04-21: 2 mL via INTRATHECAL

## 2012-04-21 MED ORDER — LACTATED RINGERS IV SOLN
INTRAVENOUS | Status: DC | PRN
  Administered 2012-04-21: 10:00:00 via INTRAVENOUS

## 2012-04-21 MED ORDER — FENTANYL CITRATE 0.05 MG/ML IJ SOLN
INTRAMUSCULAR | Status: DC | PRN
  Administered 2012-04-21: 25 ug via INTRATHECAL
  Administered 2012-04-21: 50 ug via INTRAVENOUS

## 2012-04-21 MED ORDER — EPHEDRINE SULFATE 50 MG/ML IJ SOLN
INTRAMUSCULAR | Status: DC | PRN
  Administered 2012-04-21 (×2): 10 mg via INTRAVENOUS

## 2012-04-21 MED ORDER — MORPHINE SULFATE (PF) 0.5 MG/ML IJ SOLN
INTRAMUSCULAR | Status: DC | PRN
  Administered 2012-04-21: .15 mg via INTRATHECAL

## 2012-04-21 MED ORDER — ONDANSETRON HCL 4 MG PO TABS: 4.0000 mg | ORAL_TABLET | ORAL | Status: DC | PRN

## 2012-04-21 MED ORDER — WITCH HAZEL-GLYCERIN EX PADS: 1.0000 "application " | MEDICATED_PAD | CUTANEOUS | Status: DC | PRN

## 2012-04-21 MED ORDER — DIPHENHYDRAMINE HCL 50 MG/ML IJ SOLN: 12.5000 mg | INTRAMUSCULAR | Status: DC | PRN

## 2012-04-21 MED ORDER — METOCLOPRAMIDE HCL 5 MG/ML IJ SOLN: 10.0000 mg | Freq: Three times a day (TID) | INTRAMUSCULAR | Status: DC | PRN

## 2012-04-21 MED ORDER — KETOROLAC TROMETHAMINE 30 MG/ML IJ SOLN
30.0000 mg | Freq: Four times a day (QID) | INTRAMUSCULAR | Status: AC | PRN
  Administered 2012-04-21: 30 mg via INTRAVENOUS

## 2012-04-21 MED ORDER — DIBUCAINE 1 % RE OINT: 1.0000 "application " | TOPICAL_OINTMENT | RECTAL | Status: DC | PRN

## 2012-04-21 MED ORDER — DIPHENHYDRAMINE HCL 50 MG/ML IJ SOLN: 25.0000 mg | INTRAMUSCULAR | Status: DC | PRN

## 2012-04-21 MED ORDER — PHENYLEPHRINE 40 MCG/ML (10ML) SYRINGE FOR IV PUSH (FOR BLOOD PRESSURE SUPPORT)
PREFILLED_SYRINGE | INTRAVENOUS | Status: AC
  Filled 2012-04-21: qty 15

## 2012-04-21 MED ORDER — OXYTOCIN 10 UNIT/ML IJ SOLN
INTRAMUSCULAR | Status: AC
  Filled 2012-04-21: qty 4

## 2012-04-21 MED ORDER — EPHEDRINE 5 MG/ML INJ
INTRAVENOUS | Status: AC
  Filled 2012-04-21: qty 10

## 2012-04-21 MED ORDER — LACTATED RINGERS IV SOLN
INTRAVENOUS | Status: DC
  Administered 2012-04-21: 09:00:00 via INTRAVENOUS

## 2012-04-21 MED ORDER — SCOPOLAMINE 1 MG/3DAYS TD PT72
1.0000 | MEDICATED_PATCH | Freq: Once | TRANSDERMAL | Status: DC
  Administered 2012-04-21: 1.5 mg via TRANSDERMAL

## 2012-04-21 MED ORDER — FENTANYL CITRATE 0.05 MG/ML IJ SOLN
INTRAMUSCULAR | Status: AC
  Administered 2012-04-21: 50 ug via INTRAVENOUS
  Filled 2012-04-21: qty 2

## 2012-04-21 MED ORDER — MEPERIDINE HCL 25 MG/ML IJ SOLN: 6.2500 mg | INTRAMUSCULAR | Status: DC | PRN

## 2012-04-21 MED ORDER — ONDANSETRON HCL 4 MG/2ML IJ SOLN
INTRAMUSCULAR | Status: DC | PRN
  Administered 2012-04-21: 4 mg via INTRAVENOUS

## 2012-04-21 MED ORDER — ONDANSETRON HCL 4 MG/2ML IJ SOLN: 4.0000 mg | Freq: Three times a day (TID) | INTRAMUSCULAR | Status: DC | PRN

## 2012-04-21 MED ORDER — PHENYLEPHRINE HCL 10 MG/ML IJ SOLN
INTRAMUSCULAR | Status: DC | PRN
  Administered 2012-04-21 (×3): 40 ug via INTRAVENOUS
  Administered 2012-04-21: 120 ug via INTRAVENOUS
  Administered 2012-04-21 (×2): 40 ug via INTRAVENOUS
  Administered 2012-04-21 (×3): 80 ug via INTRAVENOUS
  Administered 2012-04-21: 40 ug via INTRAVENOUS

## 2012-04-21 MED ORDER — OXYTOCIN 40 UNITS IN LACTATED RINGERS INFUSION - SIMPLE MED: 62.5000 mL/h | INTRAVENOUS | Status: AC

## 2012-04-21 MED ORDER — LANOLIN HYDROUS EX OINT: 1.0000 "application " | TOPICAL_OINTMENT | CUTANEOUS | Status: DC | PRN

## 2012-04-21 MED ORDER — NALOXONE HCL 1 MG/ML IJ SOLN: 1.0000 ug/kg/h | INTRAVENOUS | Status: DC | PRN

## 2012-04-21 MED ORDER — FENTANYL CITRATE 0.05 MG/ML IJ SOLN
25.0000 ug | INTRAMUSCULAR | Status: DC | PRN
  Administered 2012-04-21 (×2): 50 ug via INTRAVENOUS

## 2012-04-21 MED ORDER — SIMETHICONE 80 MG PO CHEW
80.0000 mg | CHEWABLE_TABLET | Freq: Three times a day (TID) | ORAL | Status: DC
  Administered 2012-04-21 – 2012-04-23 (×7): 80 mg via ORAL

## 2012-04-21 MED ORDER — KETOROLAC TROMETHAMINE 30 MG/ML IJ SOLN
INTRAMUSCULAR | Status: AC
  Administered 2012-04-21: 30 mg via INTRAVENOUS
  Filled 2012-04-21: qty 1

## 2012-04-21 MED ORDER — OXYCODONE-ACETAMINOPHEN 5-325 MG PO TABS
1.0000 | ORAL_TABLET | ORAL | Status: DC | PRN
  Administered 2012-04-22: 2 via ORAL
  Administered 2012-04-22 (×4): 1 via ORAL
  Administered 2012-04-22 – 2012-04-23 (×3): 2 via ORAL
  Filled 2012-04-21 (×2): qty 2
  Filled 2012-04-21 (×2): qty 1
  Filled 2012-04-21: qty 2
  Filled 2012-04-21 (×2): qty 1
  Filled 2012-04-21: qty 2

## 2012-04-21 MED ORDER — FENTANYL CITRATE 0.05 MG/ML IJ SOLN
INTRAMUSCULAR | Status: AC
  Filled 2012-04-21: qty 2

## 2012-04-21 MED ORDER — DIPHENHYDRAMINE HCL 25 MG PO CAPS
25.0000 mg | ORAL_CAPSULE | ORAL | Status: DC | PRN
  Administered 2012-04-22: 25 mg via ORAL
  Filled 2012-04-21: qty 1

## 2012-04-21 MED ORDER — KETOROLAC TROMETHAMINE 30 MG/ML IJ SOLN: 30.0000 mg | Freq: Four times a day (QID) | INTRAMUSCULAR | Status: AC | PRN

## 2012-04-21 MED ORDER — LACTATED RINGERS IV SOLN: INTRAVENOUS | Status: DC

## 2012-04-21 MED ORDER — NALBUPHINE HCL 10 MG/ML IJ SOLN
5.0000 mg | INTRAMUSCULAR | Status: DC | PRN
  Administered 2012-04-21: 10 mg via INTRAVENOUS
  Filled 2012-04-21 (×2): qty 1

## 2012-04-21 MED ORDER — DIPHENHYDRAMINE HCL 25 MG PO CAPS: 25.0000 mg | ORAL_CAPSULE | Freq: Four times a day (QID) | ORAL | Status: DC | PRN

## 2012-04-21 MED ORDER — SIMETHICONE 80 MG PO CHEW: 80.0000 mg | CHEWABLE_TABLET | ORAL | Status: DC | PRN

## 2012-04-21 MED ORDER — IBUPROFEN 600 MG PO TABS: 600.0000 mg | ORAL_TABLET | Freq: Four times a day (QID) | ORAL | Status: DC | PRN

## 2012-04-21 MED ORDER — SENNOSIDES-DOCUSATE SODIUM 8.6-50 MG PO TABS
2.0000 | ORAL_TABLET | Freq: Every day | ORAL | Status: DC
  Administered 2012-04-21 – 2012-04-22 (×2): 2 via ORAL

## 2012-04-21 MED ORDER — OXYTOCIN 10 UNIT/ML IJ SOLN
40.0000 [IU] | INTRAVENOUS | Status: DC | PRN
  Administered 2012-04-21: 40 [IU] via INTRAVENOUS

## 2012-04-21 MED ORDER — NALBUPHINE HCL 10 MG/ML IJ SOLN
5.0000 mg | INTRAMUSCULAR | Status: DC | PRN
Start: 2012-04-21 — End: 2012-04-23
  Filled 2012-04-21: qty 1

## 2012-04-21 MED ORDER — MORPHINE SULFATE 0.5 MG/ML IJ SOLN
INTRAMUSCULAR | Status: AC
  Filled 2012-04-21: qty 10

## 2012-04-21 MED ORDER — SODIUM CHLORIDE 0.9 % IJ SOLN: 3.0000 mL | INTRAMUSCULAR | Status: DC | PRN

## 2012-04-21 MED ORDER — ONDANSETRON HCL 4 MG/2ML IJ SOLN
4.0000 mg | INTRAMUSCULAR | Status: DC | PRN
  Filled 2012-04-21: qty 2

## 2012-04-21 MED ORDER — MENTHOL 3 MG MT LOZG: 1.0000 | LOZENGE | OROMUCOSAL | Status: DC | PRN

## 2012-04-21 MED ORDER — IBUPROFEN 600 MG PO TABS
600.0000 mg | ORAL_TABLET | Freq: Four times a day (QID) | ORAL | Status: DC
  Administered 2012-04-21 – 2012-04-23 (×6): 600 mg via ORAL
  Filled 2012-04-21 (×6): qty 1

## 2012-04-21 MED ORDER — NALOXONE HCL 0.4 MG/ML IJ SOLN: 0.4000 mg | INTRAMUSCULAR | Status: DC | PRN

## 2012-04-21 MED ORDER — ZOLPIDEM TARTRATE 5 MG PO TABS: 5.0000 mg | ORAL_TABLET | Freq: Every evening | ORAL | Status: DC | PRN

## 2012-04-21 MED ORDER — TETANUS-DIPHTH-ACELL PERTUSSIS 5-2.5-18.5 LF-MCG/0.5 IM SUSP
0.5000 mL | Freq: Once | INTRAMUSCULAR | Status: AC
  Administered 2012-04-22: 0.5 mL via INTRAMUSCULAR

## 2012-04-21 SURGICAL SUPPLY — 20 items
CLOTH BEACON ORANGE TIMEOUT ST (SAFETY) ×2 IMPLANT
DRSG COVADERM 4X10 (GAUZE/BANDAGES/DRESSINGS) ×2 IMPLANT
DURAPREP 26ML APPLICATOR (WOUND CARE) ×2 IMPLANT
ELECT REM PT RETURN 9FT ADLT (ELECTROSURGICAL) ×2
ELECTRODE REM PT RTRN 9FT ADLT (ELECTROSURGICAL) ×1 IMPLANT
EXTRACTOR VACUUM KIWI (MISCELLANEOUS) ×2 IMPLANT
GLOVE BIO SURGEON STRL SZ 6.5 (GLOVE) ×2 IMPLANT
GLOVE BIOGEL PI IND STRL 7.0 (GLOVE) ×2 IMPLANT
GLOVE BIOGEL PI INDICATOR 7.0 (GLOVE) ×2
GOWN PREVENTION PLUS LG XLONG (DISPOSABLE) ×6 IMPLANT
NS IRRIG 1000ML POUR BTL (IV SOLUTION) ×2 IMPLANT
PACK C SECTION WH (CUSTOM PROCEDURE TRAY) ×2 IMPLANT
PAD OB MATERNITY 4.3X12.25 (PERSONAL CARE ITEMS) ×2 IMPLANT
SUT VIC AB 0 CT1 36 (SUTURE) ×12 IMPLANT
SUT VIC AB 2-0 CT1 27 (SUTURE) ×1
SUT VIC AB 2-0 CT1 TAPERPNT 27 (SUTURE) ×1 IMPLANT
SUT VIC AB 4-0 PS2 27 (SUTURE) ×2 IMPLANT
TOWEL OR 17X24 6PK STRL BLUE (TOWEL DISPOSABLE) ×4 IMPLANT
TRAY FOLEY CATH 14FR (SET/KITS/TRAYS/PACK) IMPLANT
WATER STERILE IRR 1000ML POUR (IV SOLUTION) ×2 IMPLANT

## 2012-04-21 NOTE — OR Nursing (Signed)
Placenta to OR Refrigerator. 

## 2012-04-21 NOTE — Anesthesia Postprocedure Evaluation (Signed)
  Anesthesia Post-op Note  Patient: Robin Fox  Procedure(s) Performed: Procedure(s) (LRB) with comments: CESAREAN SECTION (N/A)  Patient Location:   Anesthesia Type:Spinal  Level of Consciousness: awake, alert  and oriented  Airway and Oxygen Therapy: Patient Spontanous Breathing  Post-op Pain: none  Post-op Assessment: Post-op Vital signs reviewed  Post-op Vital Signs: Reviewed and stable  Complications: No apparent anesthesia complications

## 2012-04-21 NOTE — Addendum Note (Signed)
Addendum  created 04/21/12 1540 by Gertie Fey, CRNA   Modules edited:Notes Section

## 2012-04-21 NOTE — Preoperative (Signed)
Beta Blockers   Reason not to administer Beta Blockers:Not Applicable 

## 2012-04-21 NOTE — Anesthesia Postprocedure Evaluation (Signed)
  Anesthesia Post-op Note  Patient: Robin Fox  Procedure(s) Performed: Procedure(s) (LRB) with comments: CESAREAN SECTION (N/A)  Patient Location: PACU  Anesthesia Type:Spinal  Level of Consciousness: awake, alert  and oriented  Airway and Oxygen Therapy: Patient Spontanous Breathing  Post-op Pain: none  Post-op Assessment: Post-op Vital signs reviewed, Patient's Cardiovascular Status Stable, Respiratory Function Stable, Patent Airway, No signs of Nausea or vomiting, Pain level controlled, No headache, No backache, No residual numbness and No residual motor weakness  Post-op Vital Signs: Reviewed and stable  Complications: No apparent anesthesia complications

## 2012-04-21 NOTE — H&P (Addendum)
Robin Fox is a 36 y.o. female presenting for repeat cesarean section at [redacted]w[redacted]d due to chronic hypertension.She declines TOLAC. The procedure and the risk of anesthesia, bleeding, transfusion, infection, visceral organ damage were discussed and her questions were answered.[id number]    Maternal Medical History:  Reason for admission: Repeat cesarean section  Contractions: Frequency: irregular.   Perceived severity is moderate.    Fetal activity: Perceived fetal activity is normal.    Prenatal complications: Hypertension.   No bleeding or pre-eclampsia.     OB History    Grav Para Term Preterm Abortions TAB SAB Ect Mult Living   4 2  2  0 0 0 0 2 3     Past Medical History  Diagnosis Date  . Herpes genitalia     2013 - 1st outbreak  . Trichomonas   . Twins   . Hypertension     2008  . Gestational diabetes     Neg 2 hr test after preg  . Seasonal allergies   . Headache   . Pre-eclampsia in third trimester   . Shortness of breath     w exertion  . Anemia     hx    Past Surgical History  Procedure Date  . Cesarean section     X 2   Family History: family history includes Heart disease in her maternal grandfather; Hypertension in her mother; and Stroke in her maternal grandmother. Social History:  reports that she quit smoking about 6 months ago. Her smoking use included Cigarettes. She has a 8.5 pack-year smoking history. She has never used smokeless tobacco. She reports that she drinks alcohol. She reports that she does not use illicit drugs.   Prenatal Transfer Tool  Maternal Diabetes: No  Maternal Ultrasounds/Referrals: Normal Fetal Ultrasounds or other Referrals:  None Maternal Substance Abuse:  No Significant Maternal Medications:  Meds include: Other: LABETALOL Significant Maternal Lab Results:  None Other Comments:  None  Review of Systems  Constitutional: Negative for fever.  Respiratory: Negative for cough.   Neurological: Negative for headaches.       Blood pressure 142/85, pulse 90, temperature 98.2 F (36.8 C), temperature source Oral, resp. rate 20, height 5\' 7"  (1.702 m), last menstrual period 08/06/2011, SpO2 100.00%. Maternal Exam:  Uterine Assessment: Contraction strength is mild.  Abdomen: Surgical scars: low transverse.   Fundal height is c/w dates.   Fetal presentation: vertex  Pelvis: deferred    Physical Exam  Vitals reviewed. Constitutional: She is oriented to person, place, and time. She appears well-developed. No distress.  Cardiovascular: Normal rate, regular rhythm and normal heart sounds.   Respiratory: Effort normal and breath sounds normal. No respiratory distress.  GI: Soft.  Musculoskeletal: She exhibits edema (trace).  Neurological: She is alert and oriented to person, place, and time.  Skin: Skin is warm and dry.  Psychiatric: She has a normal mood and affect. Her behavior is normal.    Prenatal labs: ABO, Rh: --/--/A POS (11/22 1105) Antibody: NEG (11/22 1105) Rubella: 111.0 (06/27 0934) RPR: NON REACTIVE (11/22 1105)  HBsAg: NEGATIVE (06/27 0934)  HIV: NON REACTIVE (09/30 1026)  GBS:     Assessment/Plan: 37 weeks chronic hypertension for repeat CS at 37 weeks. Husband will have vasectomy.   Robin Fox 04/21/2012, 8:45 AM

## 2012-04-21 NOTE — Transfer of Care (Signed)
Immediate Anesthesia Transfer of Care Note  Patient: Robin Fox  Procedure(s) Performed: Procedure(s) (LRB) with comments: CESAREAN SECTION (N/A)  Patient Location: PACU  Anesthesia Type:Spinal  Level of Consciousness: awake, alert  and oriented  Airway & Oxygen Therapy: Patient Spontanous Breathing  Post-op Assessment: Report given to PACU RN and Post -op Vital signs reviewed and stable  Post vital signs: stable  Complications: No apparent anesthesia complications

## 2012-04-21 NOTE — Op Note (Signed)
Procedure: Repeat low transverse cesarean section Preoperative diagnosis: Intrauterine pregnancy [redacted] weeks gestation with chronic hypertension and gestational diabetes Postoperative diagnosis: Intrauterine pregnancy delivered Surgeon: Dr. Scheryl Darter Anesthesia: Spinal by Dr. Mal Amabile Findings: Intrauterine pregnancy delivered liveborn female infant Apgar 7 and 9 Drains: Foley catheter Estimated blood loss: 600 mL Complications: None Counts: Correct  Patient gave written consent for repeat cesarean section at [redacted] weeks gestation indicated due to chronic hypertension. Patient has gestational diabetes as well. Patient identification was confirmed and she was brought the operating room and spinal anesthesia was induced. Patient placed in dorsal supine position left lateral tilt. Foley catheter was placed in the abdomen was sterilely prepped and draped. 10 blade was used to make a Pfannenstiel incision at the site of her previous cesarean section scars. Incision was carried down to the fascia and fascial incision was extended transversely with curved Mayo scissors. Fascia was separated from underlying tissue attachments with blunt and sharp dissection. Bellies of the rectus muscles were adherent at the midline and these were separated with scissors. Peritoneal cavity was entered. The Alexis retractor was placed. The bladder flap was created using Metzenbaum scissors. The lower uterine tenderness Was number Aleve was extended transversely. Clear fluid was expressed with rupture of membranes. Fetal head was elevated and delivered with the assistance of the Kiwi vacuum. Double nuchal cord was reduced. The infant was delivered atraumatically and infant was vigorous at birth. Mouth and nose were cleared with bulb suction and cord clamped and cut. Infant was handed to nursery personnel. Apgars were 7 and 9 and the infant was a liveborn female. The uterine incision was closed with a running locking suture with 0  Vicryl. An imbricating layer followed with 0 Vicryl good hemostasis was seen. Both adnexa were inspected and appeared normal. No other lesions were seen. The rectus muscles which were adherent to the underlying peritoneum were reapproximated with interrupted 0 Vicryl sutures. Fascia was closed with a running suture with 0 Vicryl. The incision was inspected and hemostasis was assured and the incision was irrigated. Skin was closed with a running subcuticular suture with 4-0 Vicryl. Sterile dressing was applied. Patient tolerated the procedure well without complications. She was brought in stable condition to PACU.   Dr. Scheryl Darter April 21 2012 12:05 PM

## 2012-04-21 NOTE — Anesthesia Preprocedure Evaluation (Addendum)
Anesthesia Evaluation  Patient identified by MRN, date of birth, ID band Patient awake    Reviewed: Allergy & Precautions, H&P , NPO status , Patient's Chart, lab work & pertinent test results  Airway Mallampati: III TM Distance: >3 FB Neck ROM: Full    Dental No notable dental hx. (+) Teeth Intact   Pulmonary shortness of breath,  breath sounds clear to auscultation  Pulmonary exam normal       Cardiovascular hypertension, Pt. on medications Rhythm:Regular Rate:Normal  Chronic HTN with superimposed Precclamsia   Neuro/Psych  Headaches,    GI/Hepatic Neg liver ROS, GERD-  Medicated and Controlled,  Endo/Other  diabetes, Well Controlled, Gestational, Oral Hypoglycemic AgentsMorbid obesity  Renal/GU negative Renal ROS     Musculoskeletal negative musculoskeletal ROS (+)   Abdominal (+) + obese,   Peds  Hematology negative hematology ROS (+)   Anesthesia Other Findings   Reproductive/Obstetrics Previous C/Section                           Anesthesia Physical Anesthesia Plan  ASA: III  Anesthesia Plan: Spinal   Post-op Pain Management:    Induction:   Airway Management Planned: Natural Airway  Additional Equipment:   Intra-op Plan:   Post-operative Plan: Extubation in OR  Informed Consent: I have reviewed the patients History and Physical, chart, labs and discussed the procedure including the risks, benefits and alternatives for the proposed anesthesia with the patient or authorized representative who has indicated his/her understanding and acceptance.   Dental advisory given  Plan Discussed with: Anesthesiologist, Surgeon and CRNA  Anesthesia Plan Comments:         Anesthesia Quick Evaluation

## 2012-04-22 LAB — CBC
HCT: 31.7 % — ABNORMAL LOW (ref 36.0–46.0)
Hemoglobin: 10.1 g/dL — ABNORMAL LOW (ref 12.0–15.0)
MCH: 22.3 pg — ABNORMAL LOW (ref 26.0–34.0)
MCHC: 31.9 g/dL (ref 30.0–36.0)
MCV: 70.1 fL — ABNORMAL LOW (ref 78.0–100.0)
RBC: 4.52 MIL/uL (ref 3.87–5.11)

## 2012-04-22 NOTE — Progress Notes (Signed)
UR chart review completed.  

## 2012-04-22 NOTE — Progress Notes (Signed)
Post Partum Day 1 Subjective: no complaints, up ad lib, tolerating PO and has not voided since the foley cath was removed  Objective: Blood pressure 124/94, pulse 92, temperature 99 F (37.2 C), temperature source Oral, resp. rate 18, height 5\' 7"  (1.702 m), last menstrual period 08/06/2011, SpO2 100.00%, unknown if currently breastfeeding.  Physical Exam:  General: alert, cooperative and no distress Lochia: appropriate Uterine Fundus: firm Incision: slight clear drainage present, bandage in place DVT Evaluation: No evidence of DVT seen on physical exam.   Basename 04/22/12 0555  HGB 10.1*  HCT 31.7*    Assessment/Plan: Breastfeeding, would like a lactation consult Plan for circ at [facility] Contraception: husband has vasectomy scheduled for next week Plan to discharge on Friday   LOS: 1 day   Corky Downs 04/22/2012, 7:38 AM

## 2012-04-22 NOTE — Progress Notes (Signed)
I saw the patient and I agree with the student's note.  ARNOLD,JAMES

## 2012-04-23 ENCOUNTER — Encounter (HOSPITAL_COMMUNITY): Payer: Self-pay | Admitting: Obstetrics & Gynecology

## 2012-04-23 MED ORDER — IBUPROFEN 600 MG PO TABS: 600.0000 mg | ORAL_TABLET | Freq: Four times a day (QID) | ORAL | Status: DC

## 2012-04-23 MED ORDER — OXYCODONE-ACETAMINOPHEN 5-325 MG PO TABS: 1.0000 | ORAL_TABLET | ORAL | Status: DC | PRN

## 2012-04-23 NOTE — Discharge Summary (Signed)
Obstetric Discharge Summary Reason for Admission: cesarean section for Chronic Htn and Gestational Diabetes Prenatal Procedures: none Intrapartum Procedures: cesarean: low cervical, transverse Postpartum Procedures: none Complications-Operative and Postpartum: none Hemoglobin  Date Value Range Status  04/22/2012 10.1* 12.0 - 15.0 g/dL Final     HCT  Date Value Range Status  04/22/2012 31.7* 36.0 - 46.0 % Final  Hospital Course: Procedure: Repeat low transverse cesarean section  Preoperative diagnosis: Intrauterine pregnancy [redacted] weeks gestation with chronic hypertension and gestational diabetes  Postoperative diagnosis: Intrauterine pregnancy delivered  Surgeon: Dr. Scheryl Darter  Anesthesia: Spinal by Dr. Mal Amabile  Findings: Intrauterine pregnancy delivered liveborn female infant Apgar 7 and 9  Drains: Foley catheter  Estimated blood loss: 600 mL   Physical Exam:  General: alert and no distress Lochia: appropriate Uterine Fundus: firm Incision: healing well, no significant drainage DVT Evaluation: No evidence of DVT seen on physical exam.  Discharge Diagnoses: Term Pregnancy-delivered  Discharge Information: Date: 04/23/2012 Activity: pelvic rest Up ad lib, no driving for 2 weeks Diet: routine Medications: Ibuprofen and Percocet, Aldomet Condition: stable Instructions: refer to practice specific booklet Discharge to: home  Will continue Aldomet, but D/C Glyburide. Do 2 hr GTT at 4 week PP visit   Newborn Data: Live born female  Birth Weight: 8 lb 2.2 oz (3690 g) APGAR: 7, 9  Home with mother.  [facility] 04/23/2012, 6:52 AM

## 2012-04-25 LAB — TYPE AND SCREEN: Unit division: 0

## 2012-04-25 NOTE — Discharge Summary (Signed)
Attestation of Attending Supervision of Advanced Practitioner: Evaluation and summary of  procedures were performed by the PA/NP/CNM/OB Fellow under my supervision/collaboration. Chart reviewed and agree with management and plan.  Tilda Burrow 04/25/2012 6:41 PM

## 2012-04-27 ENCOUNTER — Inpatient Hospital Stay (HOSPITAL_COMMUNITY)
Admission: AD | Admit: 2012-04-27 | Discharge: 2012-04-27 | Disposition: A | Discharge status: Hospice | Payer: Self-pay | Source: Ambulatory Visit | Attending: Obstetrics & Gynecology | Admitting: Obstetrics & Gynecology

## 2012-04-27 ENCOUNTER — Telehealth: Payer: Self-pay | Admitting: Obstetrics and Gynecology

## 2012-04-27 DIAGNOSIS — O10919 Unspecified pre-existing hypertension complicating pregnancy, unspecified trimester: Secondary | ICD-10-CM

## 2012-04-27 DIAGNOSIS — O909 Complication of the puerperium, unspecified: Secondary | ICD-10-CM | POA: Insufficient documentation

## 2012-04-27 DIAGNOSIS — IMO0001 Reserved for inherently not codable concepts without codable children: Secondary | ICD-10-CM | POA: Insufficient documentation

## 2012-04-27 DIAGNOSIS — O10019 Pre-existing essential hypertension complicating pregnancy, unspecified trimester: Secondary | ICD-10-CM

## 2012-04-27 LAB — CBC
HCT: 34 % — ABNORMAL LOW (ref 36.0–46.0)
MCH: 22.7 pg — ABNORMAL LOW (ref 26.0–34.0)
MCHC: 32.1 g/dL (ref 30.0–36.0)
MCV: 70.7 fL — ABNORMAL LOW (ref 78.0–100.0)
Platelets: 256 10*3/uL (ref 150–400)
RDW: 17 % — ABNORMAL HIGH (ref 11.5–15.5)
WBC: 7.9 10*3/uL (ref 4.0–10.5)

## 2012-04-27 LAB — PROTEIN / CREATININE RATIO, URINE
Creatinine, Urine: 84.1 mg/dL
Protein Creatinine Ratio: 0.23 — ABNORMAL HIGH (ref 0.00–0.15)
Total Protein, Urine: 19.4 mg/dL

## 2012-04-27 LAB — COMPREHENSIVE METABOLIC PANEL
Albumin: 2.9 g/dL — ABNORMAL LOW (ref 3.5–5.2)
BUN: 8 mg/dL (ref 6–23)
Calcium: 9.2 mg/dL (ref 8.4–10.5)
Chloride: 101 mEq/L (ref 96–112)
Creatinine, Ser: 1.1 mg/dL (ref 0.50–1.10)
Total Bilirubin: 0.4 mg/dL (ref 0.3–1.2)

## 2012-04-27 LAB — URINALYSIS, ROUTINE W REFLEX MICROSCOPIC
Bilirubin Urine: NEGATIVE
Hgb urine dipstick: NEGATIVE
Nitrite: NEGATIVE
Protein, ur: NEGATIVE mg/dL
Urobilinogen, UA: 0.2 mg/dL (ref 0.0–1.0)

## 2012-04-27 MED ORDER — OXYCODONE-ACETAMINOPHEN 5-325 MG PO TABS
2.0000 | ORAL_TABLET | Freq: Once | ORAL | Status: AC
  Administered 2012-04-27: 2 via ORAL
  Filled 2012-04-27: qty 2

## 2012-04-27 MED ORDER — HYDROCHLOROTHIAZIDE 25 MG PO TABS: 25.0000 mg | ORAL_TABLET | Freq: Every day | ORAL | Status: DC

## 2012-04-27 MED ORDER — METHYLDOPA 500 MG PO TABS
500.0000 mg | ORAL_TABLET | Freq: Once | ORAL | Status: AC
  Administered 2012-04-27: 500 mg via ORAL
  Filled 2012-04-27: qty 1

## 2012-04-27 NOTE — MAU Note (Signed)
Home nurse came to home today and said her incision didn't look good and her BP was too high.

## 2012-04-27 NOTE — MAU Note (Signed)
Patient states she had a home health nurse take her blood pressure today and it was 158/108. Was sent to MAU for evaluation. Patient states she had preeclampsia during this pregnancy. Patient states she is having incisional pain and feels exhausted. Feels heaviness in legs.Denies headache at this time.

## 2012-04-27 NOTE — MAU Provider Note (Signed)
History     CSN: [id number]  Arrival date and time: 04/27/12 1714   First Provider Initiated Contact with Patient 04/27/12 1829      Chief Complaint  Patient presents with  . Hypertension   HPI This is a 36 y.o. female who is about one week post C/S (elective repeat for Chronic HTN and diabetes) who presents for evaluation of hypertension noted at home visit. Denies headache or visual changes.   Has not been taking Aldomet all week because she thought it was bad for breastfeeding. Just started taking it again, but only took one 500mg  tablet today.   OB History    Grav Para Term Preterm Abortions TAB SAB Ect Mult Living   4 3 1 2  0 0 0 0 2 4      Past Medical History  Diagnosis Date  . Herpes genitalia     2013 - 1st outbreak  . Trichomonas   . Twins   . Hypertension     2008  . Gestational diabetes     Neg 2 hr test after preg  . Seasonal allergies   . Headache   . Pre-eclampsia in third trimester   . Shortness of breath     w exertion  . Anemia     hx     Past Surgical History  Procedure Date  . Cesarean section     X 2  . Cesarean section 04/21/2012    Procedure: CESAREAN SECTION;  Surgeon: Adam Phenix, MD;  Location: [facility];  Service: Obstetrics;  Laterality: N/A;    Family History  Problem Relation Age of Onset  . Hypertension Mother   . Stroke Maternal Grandmother   . Heart disease Maternal Grandfather     History  Substance Use Topics  . Smoking status: Former Smoker -- 0.5 packs/day for 17 years    Types: Cigarettes    Quit date: 10/11/2011  . Smokeless tobacco: Never Used  . Alcohol Use: 0.0 oz/week     Comment: socially but none with pregnancy    Allergies:  Allergies  Allergen Reactions  . Penicillins Hives, Shortness Of Breath and Swelling    Prescriptions prior to admission  Medication Sig Dispense Refill  . acetaminophen (TYLENOL) 500 MG tablet Take 1,000 mg by mouth every 6 (six) hours as needed.      . cetirizine (ZYRTEC)  10 MG tablet Take 10 mg by mouth as needed.      . ciprofloxacin (CIPRO) 500 MG tablet Take 1 tablet (500 mg total) by mouth 2 (two) times daily.  10 tablet  0  . ibuprofen (ADVIL,MOTRIN) 600 MG tablet Take 1 tablet (600 mg total) by mouth every 6 (six) hours.  30 tablet  1  . methyldopa (ALDOMET) 500 MG tablet Take 2 tablets (1,000 mg total) by mouth 2 (two) times daily.  120 tablet  1  . oxyCODONE-acetaminophen (PERCOCET/ROXICET) 5-325 MG per tablet Take 1-2 tablets by mouth every 4 (four) hours as needed (moderate - severe pain).  50 tablet  0  . oxyCODONE-acetaminophen (ROXICET) 5-325 MG per tablet Take 1 tablet by mouth every 4 (four) hours as needed for pain.  20 tablet  0  . Prenatal Vit-Fe Fumarate-FA (PRENATAL MULTIVITAMIN) TABS Take 1 tablet by mouth daily.      . ranitidine (ZANTAC) 150 MG tablet Take 150 mg by mouth as needed.        ROS See HPI  Physical Exam   Blood pressure 153/97, pulse 82,  temperature 99 F (37.2 C), temperature source Oral, resp. rate 18, weight 263 lb (119.296 kg), last menstrual period 08/06/2011, SpO2 100.00%, unknown if currently breastfeeding.  Physical Exam  Constitutional: She is oriented to person, place, and time. She appears well-developed and well-nourished. No distress.  HENT:  Head: Normocephalic.  Cardiovascular: Normal rate.   Respiratory: Effort normal.  GI: Soft. She exhibits no distension and no mass. There is no tenderness. There is no rebound and no guarding.       Cesarean incision healing well. There is a small area on left where skin is everted, but incision is intact  Musculoskeletal: Normal range of motion.  Neurological: She is alert and oriented to person, place, and time.  Skin: Skin is warm and dry.  Psychiatric: She has a normal mood and affect.   Results for orders placed during the hospital encounter of 04/27/12 (from the past 72 hour(s))  CBC     Status: Abnormal   Collection Time   04/27/12  6:06 PM      Component  Value Range Comment   WBC 7.9  4.0 - 10.5 K/uL    RBC 4.81  3.87 - 5.11 MIL/uL    Hemoglobin 10.9 (*) 12.0 - 15.0 g/dL    HCT 14.7 (*) 82.9 - 46.0 %    MCV 70.7 (*) 78.0 - 100.0 fL    MCH 22.7 (*) 26.0 - 34.0 pg    MCHC 32.1  30.0 - 36.0 g/dL    RDW 56.2 (*) 13.0 - 15.5 %    Platelets 256  150 - 400 K/uL   COMPREHENSIVE METABOLIC PANEL     Status: Abnormal   Collection Time   04/27/12  6:06 PM      Component Value Range Comment   Sodium 137  135 - 145 mEq/L    Potassium 3.8  3.5 - 5.1 mEq/L    Chloride 101  96 - 112 mEq/L    CO2 28  19 - 32 mEq/L    Glucose, Bld 93  70 - 99 mg/dL    BUN 8  6 - 23 mg/dL    Creatinine, Ser 8.65  0.50 - 1.10 mg/dL    Calcium 9.2  8.4 - 78.4 mg/dL    Total Protein 6.9  6.0 - 8.3 g/dL    Albumin 2.9 (*) 3.5 - 5.2 g/dL    AST 22  0 - 37 U/L    ALT 16  0 - 35 U/L    Alkaline Phosphatase 72  39 - 117 U/L    Total Bilirubin 0.4  0.3 - 1.2 mg/dL    GFR calc non Af Amer 64 (*) >90 mL/min    GFR calc Af Amer 74 (*) >90 mL/min   PROTEIN / CREATININE RATIO, URINE     Status: Abnormal   Collection Time   04/27/12  6:35 PM      Component Value Range Comment   Creatinine, Urine 84.10      Total Protein, Urine 19.4   NO NORMAL RANGE ESTABLISHED FOR THIS TEST   PROTEIN CREATININE RATIO 0.23 (*) 0.00 - 0.15   URINALYSIS, ROUTINE W REFLEX MICROSCOPIC     Status: Normal   Collection Time   04/27/12  6:35 PM      Component Value Range Comment   Color, Urine YELLOW  YELLOW    APPearance CLEAR  CLEAR    Specific Gravity, Urine 1.010  1.005 - 1.030    pH 8.0  5.0 -  8.0    Glucose, UA NEGATIVE  NEGATIVE mg/dL    Hgb urine dipstick NEGATIVE  NEGATIVE    Bilirubin Urine NEGATIVE  NEGATIVE    Ketones, ur NEGATIVE  NEGATIVE mg/dL    Protein, ur NEGATIVE  NEGATIVE mg/dL    Urobilinogen, UA 0.2  0.0 - 1.0 mg/dL    Nitrite NEGATIVE  NEGATIVE    Leukocytes, UA NEGATIVE  NEGATIVE MICROSCOPIC NOT DONE ON URINES WITH NEGATIVE PROTEIN, BLOOD, LEUKOCYTES, NITRITE, OR  GLUCOSE <1000 mg/dL.     MAU Course  Procedures  MDM Discussed with Dr Debroah Loop. No need to wait for Pr/Cr.  Will have her restart Aldomet 1000mg  bid and add a few days of HCTZ.  Will recheck BP in clinic late week.   Assessment and Plan  A:  Post Cesarean Section      Hypertension, chronic      Medication noncompliance  P:  Restart meds as originally ordered      Pt agreeable      Come to clinic late week for BP check  [facility] 04/27/2012, 6:36 PM

## 2012-04-27 NOTE — Telephone Encounter (Signed)
Patient called with question if it will be okay to breast feed while taking Aldomet. Per Dr. Thad Ranger, it is okay to breast feed while taking Aldomet. Patient agrees and satisfied.

## 2012-05-01 ENCOUNTER — Ambulatory Visit: Payer: Self-pay | Admitting: *Deleted

## 2012-05-01 VITALS — BP 131/86 | HR 95 | Temp 98.5°F | Wt 247.1 lb

## 2012-05-01 DIAGNOSIS — I10 Essential (primary) hypertension: Secondary | ICD-10-CM

## 2012-05-01 NOTE — Progress Notes (Signed)
In for BP check, is taking meds as prescribed, bp reported to Dr. Burnice Logan- Katrinka Blazing- continue meds as ordered and come for postpartum visit as scheduled. Also discussed patient concerns of weight loss, fluid shift, etc.

## 2012-05-22 ENCOUNTER — Encounter: Payer: Self-pay | Admitting: Obstetrics & Gynecology

## 2012-05-22 ENCOUNTER — Ambulatory Visit (INDEPENDENT_AMBULATORY_CARE_PROVIDER_SITE_OTHER): Payer: Self-pay | Admitting: Obstetrics & Gynecology

## 2012-05-22 DIAGNOSIS — O99810 Abnormal glucose complicating pregnancy: Secondary | ICD-10-CM

## 2012-05-22 DIAGNOSIS — O99815 Abnormal glucose complicating the puerperium: Secondary | ICD-10-CM

## 2012-05-22 MED ORDER — NORETHINDRONE 0.35 MG PO TABS: 1.0000 | ORAL_TABLET | Freq: Every day | ORAL | Status: DC

## 2012-05-22 MED ORDER — HYDROCHLOROTHIAZIDE 25 MG PO TABS: 25.0000 mg | ORAL_TABLET | Freq: Every day | ORAL | Status: DC

## 2012-05-22 NOTE — Progress Notes (Signed)
Pt has not taken BP medication this am. 

## 2012-05-22 NOTE — Progress Notes (Signed)
Patient ID: Robin Fox, female   DOB: 1976-01-31, 36 y.o.   MRN: [id number] Subjective:     Robin Fox is a 36 y.o. female who presents for a postpartum visit. She is 5 weeks postpartum following a low cervical transverse Cesarean section. I have fully reviewed the prenatal and intrapartum course. The delivery was at 37 gestational weeks. Outcome: repeat cesarean section, low transverse incision. Anesthesia: spinal. Postpartum course has been normal. Baby's course has been normal. Baby is feeding by bottle - Enfamil with Iron. Bleeding no bleeding. Bowel function is normal. Bladder function is normal. Patient is sexually active. Contraception method is condoms. Postpartum depression screening: negative. Past Medical History  Diagnosis Date  . Herpes genitalia     2013 - 1st outbreak  . Trichomonas   . Twins   . Hypertension     2008  . Gestational diabetes     Neg 2 hr test after preg  . Seasonal allergies   . Headache   . Pre-eclampsia in third trimester   . Shortness of breath     w exertion  . Anemia     hx    Current Outpatient Prescriptions on File Prior to Visit  Medication Sig Dispense Refill  . acetaminophen (TYLENOL) 500 MG tablet Take 1,000 mg by mouth every 6 (six) hours as needed. pain      . acyclovir (ZOVIRAX) 400 MG tablet Take 400 mg by mouth 3 (three) times daily.      . cetirizine (ZYRTEC) 10 MG tablet Take 10 mg by mouth as needed. allerigies      . methyldopa (ALDOMET) 500 MG tablet Take 2 tablets (1,000 mg total) by mouth 2 (two) times daily.  120 tablet  1  . hydrochlorothiazide (HYDRODIURIL) 25 MG tablet Take 1 tablet (25 mg total) by mouth daily.  5 tablet  0    The following portions of the patient's history were reviewed and updated as appropriate: allergies, current medications, past family history, past medical history, past social history, past surgical history and problem list.  Review of Systems Constitutional: positive for fatigue and  slight dizzy Genitourinary:negative   Objective:    BP 151/101  Pulse 88  Temp 97.2 F (36.2 C) (Oral)  Ht 5\' 8"  (1.727 m)  Wt 239 lb 14.4 oz (108.818 kg)  BMI 36.48 kg/m2  Breastfeeding? No  General:  alert, cooperative, no distress and morbidly obese   Breasts:  not checked        Abdomen: soft, non-tender; bowel sounds normal; no masses,  no organomegaly and incision healing well   Vulva:  normal  Vagina: normal vagina  Cervix:  anteverted and no lesions  Corpus: normal  Adnexa:  normal adnexa  Rectal Exam: Not performed.        Assessment:    5 week postpartum exam. Pap smear not done at today's visit.   Plan:    1. Contraception: vasectomy and Micronor 2. HTN-start HCTZ 3. Follow up in: 1 week or as needed. Schedule 2 hr GTT  Shavonda Wiedman 05/22/2012 10:07 AM

## 2012-05-29 ENCOUNTER — Other Ambulatory Visit (INDEPENDENT_AMBULATORY_CARE_PROVIDER_SITE_OTHER): Payer: Self-pay

## 2012-05-29 DIAGNOSIS — O99814 Abnormal glucose complicating childbirth: Secondary | ICD-10-CM

## 2012-05-29 MED ORDER — ACYCLOVIR 400 MG PO TABS: 400.0000 mg | ORAL_TABLET | Freq: Three times a day (TID) | ORAL | Status: DC

## 2012-05-30 LAB — GLUCOSE TOLERANCE, 2 HOURS W/ 1HR: Glucose, Fasting: 120 mg/dL — ABNORMAL HIGH (ref 70–99)

## 2012-06-03 ENCOUNTER — Encounter: Payer: Self-pay | Admitting: *Deleted

## 2012-06-03 ENCOUNTER — Telehealth: Payer: Self-pay | Admitting: *Deleted

## 2012-06-03 NOTE — Telephone Encounter (Signed)
Message copied by Gerome Apley on Wed Jun 03, 2012  8:36 AM ------      Message from: Adam Phenix      Created: Tue Jun 02, 2012  6:44 PM       Abnormal 2 hr GTt, needs PCP f/u

## 2012-06-03 NOTE — Telephone Encounter (Signed)
Called pt and informed pt of her abnormal 2 hr GTT and that she would need to see her PCP for f/u.  Pt stated that she did not have a PCP. I informed her that I would fax a referral to [facility] and that they would send her a history form to fill out via mail.  If she could please fill out and send it back and Family Practice will then schedule an appt.  Pt stated understanding and did not have any other questions.  Referral form faxed to [facility]

## 2012-09-14 ENCOUNTER — Other Ambulatory Visit: Payer: Self-pay | Admitting: Obstetrics & Gynecology

## 2013-03-29 ENCOUNTER — Other Ambulatory Visit (HOSPITAL_COMMUNITY)
Admission: RE | Admit: 2013-03-29 | Discharge: 2013-03-29 | Disposition: A | Discharge status: Home / Self Care | Payer: BC Managed Care – PPO | Source: Ambulatory Visit | Attending: Family Medicine | Admitting: Family Medicine

## 2013-03-29 ENCOUNTER — Other Ambulatory Visit: Payer: Self-pay | Admitting: Physician Assistant

## 2013-03-29 DIAGNOSIS — Z124 Encounter for screening for malignant neoplasm of cervix: Secondary | ICD-10-CM | POA: Insufficient documentation

## 2013-04-01 ENCOUNTER — Other Ambulatory Visit: Payer: Self-pay

## 2013-04-21 ENCOUNTER — Emergency Department (INDEPENDENT_AMBULATORY_CARE_PROVIDER_SITE_OTHER)
Admission: EM | Admit: 2013-04-21 | Discharge: 2013-04-21 | Disposition: A | Discharge status: Home / Self Care | Payer: BC Managed Care – PPO | Source: Home / Self Care | Attending: Emergency Medicine | Admitting: Emergency Medicine

## 2013-04-21 ENCOUNTER — Encounter (HOSPITAL_COMMUNITY): Payer: Self-pay | Admitting: Emergency Medicine

## 2013-04-21 DIAGNOSIS — N3000 Acute cystitis without hematuria: Secondary | ICD-10-CM

## 2013-04-21 LAB — POCT URINALYSIS DIP (DEVICE)
Bilirubin Urine: NEGATIVE
Glucose, UA: NEGATIVE mg/dL
Nitrite: POSITIVE — AB
Protein, ur: 30 mg/dL — AB
Urobilinogen, UA: 0.2 mg/dL (ref 0.0–1.0)
pH: 6 (ref 5.0–8.0)

## 2013-04-21 MED ORDER — PHENAZOPYRIDINE HCL 200 MG PO TABS: 200.0000 mg | ORAL_TABLET | Freq: Three times a day (TID) | ORAL | Status: DC | PRN

## 2013-04-21 MED ORDER — CIPROFLOXACIN HCL 500 MG PO TABS: 500.0000 mg | ORAL_TABLET | Freq: Two times a day (BID) | ORAL | Status: DC

## 2013-04-21 NOTE — ED Notes (Signed)
C/o pain w urination, feels same as w prior UTI; denies other GYN concerns

## 2013-04-21 NOTE — ED Provider Notes (Signed)
Chief Complaint:  No chief complaint on file.   History of Present Illness:   Robin Fox is a 37 year old female who has had a two-day history of dysuria, frequency, urgency, and cloudy urine. She has had moderate suprapubic pain, no lower back pain. She denies any fever, chills, nausea, or vomiting. She's had no GYN complaints. Her menses have been regular. She has had urinary tract infections before, most recently one to 2 years ago. No history of kidney infections or kidney stones.  Review of Systems:  Other than noted above, the patient denies any of the following symptoms: General:  No fevers, chills, sweats, aches, or fatigue. GI:  No abdominal pain, back pain, nausea, vomiting, diarrhea, or constipation. GU:  No dysuria, frequency, urgency, hematuria, or incontinence. GYN:  No discharge, itching, vulvar pain or lesions, pelvic pain, or abnormal vaginal bleeding.  PMFSH:  Past medical history, family history, social history, meds, and allergies were reviewed.  She has hypertension and diabetes. She takes hydrochlorothiazide and Aldomet.  Physical Exam:   Vital signs:  BP 135/76  Pulse 84  Temp(Src) 97.6 F (36.4 C) (Oral)  Resp 18  SpO2 100% Gen:  Alert, oriented, in no distress. Lungs:  Clear to auscultation, no wheezes, rales or rhonchi. Heart:  Regular rhythm, no gallop or murmer. Abdomen:  Flat and soft. There was slight suprapubic pain to palpation.  No guarding, or rebound.  No hepato-splenomegaly or mass.  Bowel sounds were normally active.  No hernia. Back:  No CVA tenderness.  Skin:  Clear, warm and dry.  Labs:    Results for orders placed during the hospital encounter of 04/21/13  POCT URINALYSIS DIP (DEVICE)      Result Value Range   Glucose, UA NEGATIVE  NEGATIVE mg/dL   Bilirubin Urine NEGATIVE  NEGATIVE   Ketones, ur NEGATIVE  NEGATIVE mg/dL   Specific Gravity, Urine >=1.030  1.005 - 1.030   Hgb urine dipstick MODERATE (*) NEGATIVE   pH 6.0  5.0  - 8.0   Protein, ur 30 (*) NEGATIVE mg/dL   Urobilinogen, UA 0.2  0.0 - 1.0 mg/dL   Nitrite POSITIVE (*) NEGATIVE   Leukocytes, UA SMALL (*) NEGATIVE  POCT PREGNANCY, URINE      Result Value Range   Preg Test, Ur NEGATIVE  NEGATIVE     A urine culture was obtained.  Results are pending at this time and we will call about any positive results.  Assessment: The encounter diagnosis was Acute cystitis.   No evidence of pyelonephritis.  Plan:   1.  Meds:  The following meds were prescribed:   New Prescriptions   CIPROFLOXACIN (CIPRO) 500 MG TABLET    Take 1 tablet (500 mg total) by mouth every 12 (twelve) hours.   PHENAZOPYRIDINE (PYRIDIUM) 200 MG TABLET    Take 1 tablet (200 mg total) by mouth 3 (three) times daily as needed for pain.    2.  Patient Education/Counseling:  The patient was given appropriate handouts, self care instructions, and instructed in symptomatic relief. The patient was told to avoid intercourse for 10 days, get extra fluids, and return for a follow up with her primary care doctor at the completion of treatment for a repeat UA and culture.   3.  Follow up:  The patient was told to follow up if no better in 3 to 4 days, if becoming worse in any way, and given some red flag symptoms such as fever, flank pain, or vomiting which would  prompt immediate return.  Follow up here as needed.  Reuben Likes, MD 04/21/13 1134

## 2013-04-23 LAB — URINE CULTURE
Colony Count: >=100000
Special Requests: NORMAL

## 2013-04-23 NOTE — ED Notes (Signed)
Left detailed message on pt voice mail to complete her Rx for UTI, and call if problems

## 2013-04-27 ENCOUNTER — Telehealth (HOSPITAL_COMMUNITY): Payer: Self-pay | Admitting: *Deleted

## 2013-04-28 NOTE — ED Notes (Addendum)
Urine culture: >100,000 colonies E. Coli.  Pt. called back. Pt. verified x 2 and given results.  Pt. told she was adequately treated with Cipro. Pt.'s questions answered.  Pt. states she is still feeling low abdominal pain.  Pt. instructed to drink plenty of fluids. If not better after finishing her antibiotics to get rechecked. Pt. voiced understanding. Vassie Moselle 04/28/2013

## 2013-04-29 NOTE — ED Notes (Signed)
Returned  pts    Phone call    From  Yesterday  After  Verification of id    Pt  Was  Advised  To  Return  If needed        And   To  Complete     Anti biotics       -        Pt  verbalised    Knowledge

## 2013-04-30 NOTE — ED Notes (Signed)
Return call from answering machine; pt states she had already received return call ( message had not been deleted for return call request)

## 2013-06-19 ENCOUNTER — Emergency Department (INDEPENDENT_AMBULATORY_CARE_PROVIDER_SITE_OTHER): Payer: BC Managed Care – PPO

## 2013-06-19 ENCOUNTER — Encounter (HOSPITAL_COMMUNITY): Payer: Self-pay | Admitting: Emergency Medicine

## 2013-06-19 ENCOUNTER — Emergency Department (INDEPENDENT_AMBULATORY_CARE_PROVIDER_SITE_OTHER)
Admission: EM | Admit: 2013-06-19 | Discharge: 2013-06-19 | Disposition: A | Discharge status: Home / Self Care | Payer: BC Managed Care – PPO | Source: Home / Self Care | Attending: Family Medicine | Admitting: Family Medicine

## 2013-06-19 ENCOUNTER — Other Ambulatory Visit (HOSPITAL_COMMUNITY)
Admission: RE | Admit: 2013-06-19 | Discharge: 2013-06-19 | Disposition: A | Discharge status: Home / Self Care | Payer: BC Managed Care – PPO | Source: Ambulatory Visit | Attending: Family Medicine | Admitting: Family Medicine

## 2013-06-19 DIAGNOSIS — N949 Unspecified condition associated with female genital organs and menstrual cycle: Secondary | ICD-10-CM

## 2013-06-19 DIAGNOSIS — N760 Acute vaginitis: Secondary | ICD-10-CM | POA: Insufficient documentation

## 2013-06-19 DIAGNOSIS — R05 Cough: Secondary | ICD-10-CM

## 2013-06-19 DIAGNOSIS — Z113 Encounter for screening for infections with a predominantly sexual mode of transmission: Secondary | ICD-10-CM | POA: Insufficient documentation

## 2013-06-19 DIAGNOSIS — R102 Pelvic and perineal pain: Secondary | ICD-10-CM

## 2013-06-19 DIAGNOSIS — R059 Cough, unspecified: Secondary | ICD-10-CM

## 2013-06-19 LAB — POCT URINALYSIS DIP (DEVICE)
Bilirubin Urine: NEGATIVE
Glucose, UA: NEGATIVE mg/dL
KETONES UR: NEGATIVE mg/dL
LEUKOCYTES UA: NEGATIVE
Nitrite: NEGATIVE
Protein, ur: NEGATIVE mg/dL
SPECIFIC GRAVITY, URINE: 1.01 (ref 1.005–1.030)
Urobilinogen, UA: 0.2 mg/dL (ref 0.0–1.0)
pH: 5.5 (ref 5.0–8.0)

## 2013-06-19 LAB — POCT PREGNANCY, URINE: Preg Test, Ur: NEGATIVE

## 2013-06-19 MED ORDER — PREDNISONE 10 MG PO TABS: 30.0000 mg | ORAL_TABLET | Freq: Every day | ORAL | Status: DC

## 2013-06-19 MED ORDER — CIPROFLOXACIN HCL 500 MG PO TABS: 500.0000 mg | ORAL_TABLET | Freq: Two times a day (BID) | ORAL | Status: DC

## 2013-06-19 NOTE — Discharge Instructions (Signed)
Thank you for coming in today. Take Cipro twice daily for 5 days for urinary tract infection If your pelvic pain worsens go to [facility]'s hospital.  You do not have pneumonia. Your cough will slowly get better.  Take prednisone daily for 5 days for cough.  Call or go to the emergency room if you get worse, have trouble breathing, have chest pains, or palpitations.  If your belly pain worsens, or you have high fever, bad vomiting, blood in your stool or black tarry stool go to the Emergency Room.   [address] [place], [place] [phone]  [facility] [address] [place], [place] [phone]  [facility]: Delsa Bern MD [place], [place] [phone]  [facility] [place], [place] [phone]  [facility]: Dian Queen MD [address] [place], [place] [phone]  [facility]: Newton Pigg F MD [place], [place] [phone]  [address] [place], [place] [phone]      Abdominal Pain, Women Abdominal (stomach, pelvic, or belly) pain can be caused by many things. It is important to tell your doctor:  The location of the pain.  Does it come and go or is it present all the time?  Are there things that start the pain (eating certain foods, exercise)?  Are there other symptoms associated with the pain (fever, nausea, vomiting, diarrhea)? All of this is helpful to know when trying to find the cause of the pain. CAUSES   Stomach: virus or bacteria infection, or ulcer.  Intestine: appendicitis (inflamed appendix), regional ileitis (Crohn's disease), ulcerative colitis (inflamed colon), irritable bowel syndrome, diverticulitis (inflamed diverticulum of the colon), or cancer of the stomach or intestine.  Gallbladder  disease or stones in the gallbladder.  Kidney disease, kidney stones, or infection.  Pancreas infection or cancer.  Fibromyalgia (pain disorder).  Diseases of the female organs:  Uterus: fibroid (non-cancerous) tumors or infection.  Fallopian tubes: infection or tubal pregnancy.  Ovary: cysts or tumors.  Pelvic adhesions (scar tissue).  Endometriosis (uterus lining tissue growing in the pelvis and on the pelvic organs).  Pelvic congestion syndrome (female organs filling up with blood just before the menstrual period).  Pain with the menstrual period.  Pain with ovulation (producing an egg).  Pain with an IUD (intrauterine device, birth control) in the uterus.  Cancer of the female organs.  Functional pain (pain not caused by a disease, may improve without treatment).  Psychological pain.  Depression. DIAGNOSIS  Your doctor will decide the seriousness of your pain by doing an examination.  Blood tests.  X-rays.  Ultrasound.  CT scan (computed tomography, special type of X-ray).  MRI (magnetic resonance imaging).  Cultures, for infection.  Barium enema (dye inserted in the large intestine, to better view it with X-rays).  Colonoscopy (looking in intestine with a lighted tube).  Laparoscopy (minor surgery, looking in abdomen with a lighted tube).  Major abdominal exploratory surgery (looking in abdomen with a large incision). TREATMENT  The treatment will depend on the cause of the pain.   Many cases can be observed and treated at home.  Over-the-counter medicines recommended by your caregiver.  Prescription medicine.  Antibiotics, for infection.  Birth control pills, for painful periods or for ovulation pain.  Hormone treatment, for endometriosis.  Nerve blocking injections.  Physical therapy.  Antidepressants.  Counseling with a psychologist or psychiatrist.  Minor or major surgery. HOME CARE INSTRUCTIONS   Do not take laxatives,  unless directed by your caregiver.  Take over-the-counter pain medicine only if ordered by your caregiver. Do not take aspirin because it can cause an upset stomach or bleeding.  Try a clear liquid diet (broth or water) as ordered by your caregiver. Slowly move to a bland diet, as tolerated, if the pain is related to the stomach or intestine.  Have a thermometer and take your temperature several times a day, and record it.  Bed rest and sleep, if it helps the pain.  Avoid sexual intercourse, if it causes pain.  Avoid stressful situations.  Keep your follow-up appointments and tests, as your caregiver orders.  If the pain does not go away with medicine or surgery, you may try:  Acupuncture.  Relaxation exercises (yoga, meditation).  Group therapy.  Counseling. SEEK MEDICAL CARE IF:   You notice certain foods cause stomach pain.  Your home care treatment is not helping your pain.  You need stronger pain medicine.  You want your IUD removed.  You feel faint or lightheaded.  You develop nausea and vomiting.  You develop a rash.  You are having side effects or an allergy to your medicine. SEEK IMMEDIATE MEDICAL CARE IF:   Your pain does not go away or gets worse.  You have a fever.  Your pain is felt only in portions of the abdomen. The right side could possibly be appendicitis. The left lower portion of the abdomen could be colitis or diverticulitis.  You are passing blood in your stools (bright red or black tarry stools, with or without vomiting).  You have blood in your urine.  You develop chills, with or without a fever.  You pass out. MAKE SURE YOU:   Understand these instructions.  Will watch your condition.  Will get help right away if you are not doing well or get worse. Document Released: 03/10/2007 Document Revised: 08/05/2011 Document Reviewed: 03/30/2009 [facility] Patient Information 2014 [place], [place].

## 2013-06-19 NOTE — ED Notes (Signed)
Pt  Has  Symptoms  Of  Cough  As  Well  As    r  Earache  And  Lower  abd  Pain  With  Onset  Of  Symptoms  Being     About  10  Days    She  Is  Sitting  Upright on the  Exam table  Speaking in  Complete  sentances  And  Is  In  No acute   Severe   Distress

## 2013-06-19 NOTE — ED Provider Notes (Signed)
Robin Fox is a 38 y.o. female who presents to Urgent Care today for 2 weeks of cough and congestion associated with her pain. The cough is mildly productive. She's tried over-the-counter cold medications which have helped a little. She denies any significant shortness of breath nausea vomiting or diarrhea.  Additionally patient has 3 days of diffuse lower pelvic pain associated with dysuria and urgency and frequency. Symptoms are consistent with prior UTI. No nausea vomiting or diarrhea. Eating and drinking well. No vaginal discharge or bleeding.   Past Medical History  Diagnosis Date  . Herpes genitalia     2013 - 1st outbreak  . Trichomonas   . Twins   . Hypertension     2008  . Gestational diabetes     Neg 2 hr test after preg  . Seasonal allergies   . Headache(784.0)   . Pre-eclampsia in third trimester   . Shortness of breath     w exertion  . Anemia     hx    History  Substance Use Topics  . Smoking status: Former Smoker -- 0.50 packs/day for 17 years    Types: Cigarettes    Quit date: 10/11/2011  . Smokeless tobacco: Never Used  . Alcohol Use: 0.0 oz/week     Comment: socially but none with pregnancy   ROS as above Medications: No current facility-administered medications for this encounter.   Current Outpatient Prescriptions  Medication Sig Dispense Refill  . acetaminophen (TYLENOL) 500 MG tablet Take 1,000 mg by mouth every 6 (six) hours as needed. pain      . acyclovir (ZOVIRAX) 400 MG tablet Take 1 tablet (400 mg total) by mouth 3 (three) times daily.  90 tablet  1  . cetirizine (ZYRTEC) 10 MG tablet Take 10 mg by mouth as needed. allerigies      . ciprofloxacin (CIPRO) 500 MG tablet Take 1 tablet (500 mg total) by mouth 2 (two) times daily.  10 tablet  0  . hydrochlorothiazide (HYDRODIURIL) 25 MG tablet TAKE 1 TABLET (25 MG TOTAL) BY MOUTH DAILY.  30 tablet  3  . methyldopa (ALDOMET) 500 MG tablet Take 2 tablets (1,000 mg total) by mouth 2 (two)  times daily.  120 tablet  1  . norethindrone (MICRONOR,CAMILA,ERRIN) 0.35 MG tablet Take 1 tablet (0.35 mg total) by mouth daily.  1 Package  11  . phenazopyridine (PYRIDIUM) 200 MG tablet Take 1 tablet (200 mg total) by mouth 3 (three) times daily as needed for pain.  15 tablet  0  . predniSONE (DELTASONE) 10 MG tablet Take 3 tablets (30 mg total) by mouth daily.  15 tablet  0    Exam:  BP 121/76  Pulse 90  Temp(Src) 98.7 F (37.1 C) (Oral)  Resp 16  SpO2 96%  LMP 06/09/2013 Gen: Well NAD HEENT: EOMI,  MMM, posterior pharynx with cobblestoning. Tympanic membranes are normal appearing bilaterally Lungs: Normal work of breathing. CTABL Heart: RRR no MRG Abd: NABS, Soft. NT, ND no rebound Exts: Brisk capillary refill, warm and well perfused.  GYN: Normal external genitalia. Vaginal canal with thin white discharge. Normal-appearing cervix. Nontender  Results for orders placed during the hospital encounter of 06/19/13 (from the past 24 hour(s))  POCT URINALYSIS DIP (DEVICE)     Status: Abnormal   Collection Time    06/19/13 12:35 PM      Result Value Range   Glucose, UA NEGATIVE  NEGATIVE mg/dL   Bilirubin Urine NEGATIVE  NEGATIVE  Ketones, ur NEGATIVE  NEGATIVE mg/dL   Specific Gravity, Urine 1.010  1.005 - 1.030   Hgb urine dipstick TRACE (*) NEGATIVE   pH 5.5  5.0 - 8.0   Protein, ur NEGATIVE  NEGATIVE mg/dL   Urobilinogen, UA 0.2  0.0 - 1.0 mg/dL   Nitrite NEGATIVE  NEGATIVE   Leukocytes, UA NEGATIVE  NEGATIVE  POCT PREGNANCY, URINE     Status: None   Collection Time    06/19/13 12:37 PM      Result Value Range   Preg Test, Ur NEGATIVE  NEGATIVE   Dg Chest 2 View  06/19/2013   CLINICAL DATA:  Cough.  Decreased breath sounds at left lung base.  EXAM: CHEST  2 VIEW  COMPARISON:  None.  FINDINGS: The heart size and mediastinal contours are within normal limits. Both lungs are clear. The visualized skeletal structures are unremarkable.  IMPRESSION: No active cardiopulmonary  disease.   Electronically Signed   By: Earle Gell M.D.   On: 06/19/2013 13:30    Assessment and Plan: 38 y.o. female with  Bronchitis: Plan treatment with prednisone. Followup as needed.   Pelvic pain: Unclear etiology. Possible UTI. Urine culture pending. Vaginal cytology for gonorrhea, Chlamydia, trichomonas, BV and yeast is also pending. Most likely causes UTI. Plan to treat with Cipro.   Follow up with OB/GYN  Discussed warning signs or symptoms. Please see discharge instructions. Patient expresses understanding.    Gregor Hams, MD 06/19/13 1415

## 2013-06-20 LAB — URINE CULTURE
Colony Count: NO GROWTH
Culture: NO GROWTH
Special Requests: NORMAL

## 2013-06-22 NOTE — ED Notes (Signed)
GC/Chlamydia neg., Affirm: Candida and Trich neg., Gardnerella pos., Urine culture: No growth.  Message to Dr. Georgina Snell. Robin Fox 06/22/2013

## 2013-06-24 ENCOUNTER — Telehealth (HOSPITAL_COMMUNITY): Payer: Self-pay | Admitting: Emergency Medicine

## 2013-06-28 MED ORDER — METRONIDAZOLE 500 MG PO TABS: 500.0000 mg | ORAL_TABLET | Freq: Two times a day (BID) | ORAL | Status: DC

## 2013-06-28 NOTE — ED Notes (Signed)
Flagyl called in.  RN to contact patient  Gregor Hams, MD 06/28/13 [phone]

## 2013-06-28 NOTE — Telephone Encounter (Signed)
Message copied by Gregor Hams on Mon Jun 28, 2013  7:43 AM ------      Message from: Roselyn Meier      Created: Wed Jun 23, 2013 11:25 PM      Regarding: lab       I sent you a message on 1/27 and also told you about it.  Are you going to treat the Gardnerella?      Roselyn Meier      06/23/2013       ------

## 2013-06-29 NOTE — ED Notes (Signed)
I called pt. and left a message to call.  Pt. called back.  Pt. verified x 2 and given results.  Pt. told she needs Flagyl for bacterial vaginosis and where to pick up her Rx.   Pt. instructed to no alcohol while taking this medication.  Pt. voiced understanding. Roselyn Meier 06/29/2013

## 2014-03-28 ENCOUNTER — Encounter (HOSPITAL_COMMUNITY): Payer: Self-pay | Admitting: Emergency Medicine

## 2014-10-10 ENCOUNTER — Emergency Department (HOSPITAL_COMMUNITY)
Admission: EM | Admit: 2014-10-10 | Discharge: 2014-10-10 | Disposition: A | Discharge status: Home / Self Care | Payer: Managed Care, Other (non HMO) | Attending: Emergency Medicine | Admitting: Emergency Medicine

## 2014-10-10 ENCOUNTER — Encounter (HOSPITAL_COMMUNITY): Payer: Self-pay | Admitting: Emergency Medicine

## 2014-10-10 DIAGNOSIS — Z880 Allergy status to penicillin: Secondary | ICD-10-CM | POA: Diagnosis not present

## 2014-10-10 DIAGNOSIS — I10 Essential (primary) hypertension: Secondary | ICD-10-CM | POA: Diagnosis not present

## 2014-10-10 DIAGNOSIS — X102XXA Contact with fats and cooking oils, initial encounter: Secondary | ICD-10-CM | POA: Insufficient documentation

## 2014-10-10 DIAGNOSIS — Z862 Personal history of diseases of the blood and blood-forming organs and certain disorders involving the immune mechanism: Secondary | ICD-10-CM | POA: Diagnosis not present

## 2014-10-10 DIAGNOSIS — Y999 Unspecified external cause status: Secondary | ICD-10-CM | POA: Insufficient documentation

## 2014-10-10 DIAGNOSIS — Z79899 Other long term (current) drug therapy: Secondary | ICD-10-CM | POA: Diagnosis not present

## 2014-10-10 DIAGNOSIS — Z87891 Personal history of nicotine dependence: Secondary | ICD-10-CM | POA: Insufficient documentation

## 2014-10-10 DIAGNOSIS — T23101A Burn of first degree of right hand, unspecified site, initial encounter: Secondary | ICD-10-CM | POA: Diagnosis not present

## 2014-10-10 DIAGNOSIS — Z8619 Personal history of other infectious and parasitic diseases: Secondary | ICD-10-CM | POA: Diagnosis not present

## 2014-10-10 DIAGNOSIS — Y939 Activity, unspecified: Secondary | ICD-10-CM | POA: Insufficient documentation

## 2014-10-10 DIAGNOSIS — Z7952 Long term (current) use of systemic steroids: Secondary | ICD-10-CM | POA: Insufficient documentation

## 2014-10-10 DIAGNOSIS — Z8632 Personal history of gestational diabetes: Secondary | ICD-10-CM | POA: Insufficient documentation

## 2014-10-10 DIAGNOSIS — Y929 Unspecified place or not applicable: Secondary | ICD-10-CM | POA: Diagnosis not present

## 2014-10-10 DIAGNOSIS — T23001A Burn of unspecified degree of right hand, unspecified site, initial encounter: Secondary | ICD-10-CM | POA: Diagnosis present

## 2014-10-10 DIAGNOSIS — Z792 Long term (current) use of antibiotics: Secondary | ICD-10-CM | POA: Diagnosis not present

## 2014-10-10 DIAGNOSIS — T300 Burn of unspecified body region, unspecified degree: Secondary | ICD-10-CM

## 2014-10-10 MED ORDER — SILVER SULFADIAZINE 1 % EX CREA
TOPICAL_CREAM | Freq: Once | CUTANEOUS | Status: AC
  Administered 2014-10-10: 23:00:00 via TOPICAL
  Filled 2014-10-10: qty 85
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
## 2014-10-10 NOTE — Discharge Instructions (Signed)
Continue applying silvadene cream and changing dressings daily. Keep hand clean and dry. Return here for new or worsening symptoms-- increased redness, swelling, drainage, etc.

## 2015-03-24 ENCOUNTER — Encounter (HOSPITAL_COMMUNITY): Payer: Self-pay

## 2015-03-24 ENCOUNTER — Emergency Department (HOSPITAL_COMMUNITY)
Admission: EM | Admit: 2015-03-24 | Discharge: 2015-03-24 | Disposition: A | Discharge status: Home / Self Care | Payer: Managed Care, Other (non HMO) | Attending: Emergency Medicine | Admitting: Emergency Medicine

## 2015-03-24 DIAGNOSIS — R0981 Nasal congestion: Secondary | ICD-10-CM | POA: Insufficient documentation

## 2015-03-24 DIAGNOSIS — Z79899 Other long term (current) drug therapy: Secondary | ICD-10-CM | POA: Diagnosis not present

## 2015-03-24 DIAGNOSIS — Z880 Allergy status to penicillin: Secondary | ICD-10-CM | POA: Diagnosis not present

## 2015-03-24 DIAGNOSIS — Z862 Personal history of diseases of the blood and blood-forming organs and certain disorders involving the immune mechanism: Secondary | ICD-10-CM | POA: Insufficient documentation

## 2015-03-24 DIAGNOSIS — Z8619 Personal history of other infectious and parasitic diseases: Secondary | ICD-10-CM | POA: Diagnosis not present

## 2015-03-24 DIAGNOSIS — R21 Rash and other nonspecific skin eruption: Secondary | ICD-10-CM | POA: Diagnosis present

## 2015-03-24 DIAGNOSIS — L509 Urticaria, unspecified: Secondary | ICD-10-CM | POA: Diagnosis not present

## 2015-03-24 DIAGNOSIS — R51 Headache: Secondary | ICD-10-CM | POA: Diagnosis not present

## 2015-03-24 DIAGNOSIS — Z87891 Personal history of nicotine dependence: Secondary | ICD-10-CM | POA: Insufficient documentation

## 2015-03-24 DIAGNOSIS — R05 Cough: Secondary | ICD-10-CM | POA: Insufficient documentation

## 2015-03-24 DIAGNOSIS — I10 Essential (primary) hypertension: Secondary | ICD-10-CM | POA: Insufficient documentation

## 2015-03-24 MED ORDER — DEXAMETHASONE SODIUM PHOSPHATE 10 MG/ML IJ SOLN
10.0000 mg | Freq: Once | INTRAMUSCULAR | Status: AC
  Administered 2015-03-24: 10 mg via INTRAMUSCULAR
  Filled 2015-03-24: qty 1

## 2015-03-24 MED ORDER — PREDNISONE 10 MG PO TABS: 20.0000 mg | ORAL_TABLET | Freq: Two times a day (BID) | ORAL | Status: DC

## 2015-03-24 MED ORDER — FAMOTIDINE IN NACL 20-0.9 MG/50ML-% IV SOLN
20.0000 mg | Freq: Once | INTRAVENOUS | Status: AC
  Administered 2015-03-24: 20 mg via INTRAVENOUS
  Filled 2015-03-24: qty 50

## 2015-03-24 MED ORDER — LORATADINE 10 MG PO TABS: 10.0000 mg | ORAL_TABLET | Freq: Every day | ORAL | Status: DC

## 2015-03-24 MED ORDER — HYDROXYZINE HCL 25 MG PO TABS
25.0000 mg | ORAL_TABLET | Freq: Once | ORAL | Status: AC
  Administered 2015-03-24: 25 mg via ORAL
  Filled 2015-03-24: qty 1

## 2015-03-24 MED ORDER — FAMOTIDINE 20 MG PO TABS: 20.0000 mg | ORAL_TABLET | Freq: Two times a day (BID) | ORAL | Status: DC

## 2015-03-24 MED ORDER — LORATADINE 10 MG PO TABS
10.0000 mg | ORAL_TABLET | Freq: Once | ORAL | Status: AC
Start: 2015-03-24 — End: 2015-03-24
  Administered 2015-03-24: 10 mg via ORAL
  Filled 2015-03-24: qty 1

## 2015-03-24 MED ORDER — DIPHENHYDRAMINE HCL 50 MG/ML IJ SOLN
25.0000 mg | Freq: Once | INTRAMUSCULAR | Status: AC
  Administered 2015-03-24: 25 mg via INTRAVENOUS
  Filled 2015-03-24: qty 1

## 2015-03-24 MED ORDER — HYDROXYZINE HCL 25 MG PO TABS: 25.0000 mg | ORAL_TABLET | Freq: Four times a day (QID) | ORAL | Status: DC

## 2015-03-24 MED ORDER — METHYLPREDNISOLONE SODIUM SUCC 125 MG IJ SOLR
125.0000 mg | Freq: Once | INTRAMUSCULAR | Status: AC
  Administered 2015-03-24: 125 mg via INTRAVENOUS
  Filled 2015-03-24: qty 2

## 2015-03-24 NOTE — Discharge Instructions (Signed)
Hives Hives are itchy, red, swollen areas of the skin. They can vary in size and location on your body. Hives can come and go for hours or several days (acute hives) or for several weeks (chronic hives). Hives do not spread from person to person (noncontagious). They may get worse with scratching, exercise, and emotional stress. CAUSES   Allergic reaction to food, additives, or drugs.  Infections, including the common cold.  Illness, such as vasculitis, lupus, or thyroid disease.  Exposure to sunlight, heat, or cold.  Exercise.  Stress.  Contact with chemicals. SYMPTOMS   Red or white swollen patches on the skin. The patches may change size, shape, and location quickly and repeatedly.  Itching.  Swelling of the hands, feet, and face. This may occur if hives develop deeper in the skin. DIAGNOSIS  Your caregiver can usually tell what is wrong by performing a physical exam. Skin or blood tests may also be done to determine the cause of your hives. In some cases, the cause cannot be determined. TREATMENT  Mild cases usually get better with medicines such as antihistamines. Severe cases may require an emergency epinephrine injection. If the cause of your hives is known, treatment includes avoiding that trigger.  HOME CARE INSTRUCTIONS   Avoid causes that trigger your hives.  Take antihistamines as directed by your caregiver to reduce the severity of your hives. Non-sedating or low-sedating antihistamines are usually recommended. Do not drive while taking an antihistamine.  Take any other medicines prescribed for itching as directed by your caregiver.  Wear loose-fitting clothing.  Keep all follow-up appointments as directed by your caregiver. SEEK MEDICAL CARE IF:   You have persistent or severe itching that is not relieved with medicine.  You have painful or swollen joints. SEEK IMMEDIATE MEDICAL CARE IF:   You have a fever.  Your tongue or lips are swollen.  You have  trouble breathing or swallowing.  You feel tightness in the throat or chest.  You have abdominal pain. These problems may be the first sign of a life-threatening allergic reaction. Call your local emergency services (911 in [place] MAKE SURE YOU:   Understand these instructions.  Will watch your condition.  Will get help right away if you are not doing well or get worse.   This information is not intended to replace advice given to you by your health care provider. Make sure you discuss any questions you have with your health care provider.   Document Released: 05/13/2005 Document Revised: 05/18/2013 Document Reviewed: 08/06/2011 Elsevier Interactive Patient Education 2016 [employer]vier Inc[employer]  

## 2015-03-24 NOTE — ED Notes (Signed)
Hives on her body. They began 2 days ago She does report having used a new soap, Dial.  Very itchy

## 2015-03-24 NOTE — ED Provider Notes (Signed)
CSN: [id number]   Arrival date & time 03/24/15 1738  History  By signing my name below, I, Altamease Oiler, attest that this documentation has been prepared under the direction and in the presence of Debroah Baller, NP. Electronically Signed: Altamease Oiler, ED Scribe. 03/24/2015. 6:45 PM. Chief Complaint  Patient presents with  . Rash    HPI Patient is a 39 y.o. female presenting with rash. The history is provided by the patient. No language interpreter was used.  Rash Location:  Full body Quality: itchiness   Severity:  Moderate Onset quality:  Gradual Timing:  Constant Progression:  Worsening Chronicity:  Recurrent Context: new detergent/soap   Context: not food   Relieved by:  Nothing Worsened by:  Nothing tried Ineffective treatments:  None tried Associated symptoms: periorbital edema   Associated symptoms: no abdominal pain, no diarrhea, no fatigue, no fever, no headaches, no hoarse voice, no induration, no joint pain, no myalgias, no nausea, no shortness of breath, no sore throat, no throat swelling, no tongue swelling, no URI, not vomiting and not wheezing    Berkeley Veldman is a 39 y.o. female who presents to the Emergency Department complaining of a pruritic rash with gradual onset yesterday afternoon around 3 PM. The rash started at the arms and spread to the neck, back, and legs. Yesterday she ate waffles and [employer]urger Helper, neither of which are new for her. Pt notes recently switching from [employer] to Dial and she last used the soap last night. She had a similar rash in 2010 that improved with hydroxyzine. Associated symptoms include bilateral eye swelling and throat itching yesterday. She states that she already had a cough and congestion before the rash.   Pt denies throat swelling, SOB, ear ache, abdominal pain, nausea, and vomiting.   Past Medical History  Diagnosis Date  . Herpes genitalia     2013 - 1st outbreak  . Trichomonas   . Twins   .  Hypertension     2008  . Gestational diabetes     Neg 2 hr test after preg  . Seasonal allergies   . Headache(784.0)   . Pre-eclampsia in third trimester   . Shortness of breath     w exertion  . Anemia     hx     Past Surgical History  Procedure Laterality Date  . Cesarean section      X 2  . Cesarean section  04/21/2012    Procedure: CESAREAN SECTION;  Surgeon: Woodroe Mode, MD;  Location: [facility];  Service: Obstetrics;  Laterality: N/A;    Family History  Problem Relation Age of Onset  . Hypertension Mother   . Stroke Maternal Grandmother   . Heart disease Maternal Grandfather     Social History  Substance Use Topics  . Smoking status: Former Smoker -- 0.50 packs/day for 17 years    Types: Cigarettes    Quit date: 10/11/2011  . Smokeless tobacco: Never Used  . Alcohol Use: 0.0 oz/week     Comment: socially but none with pregnancy     Review of Systems  Constitutional: Negative for fever and fatigue.  HENT: Positive for congestion. Negative for ear pain, hoarse voice, sore throat and trouble swallowing.        Itching at the throat  Eyes:       Eye swelling  Respiratory: Positive for cough. Negative for shortness of breath and wheezing.   Gastrointestinal: Negative for nausea, vomiting, abdominal pain and diarrhea.  Musculoskeletal: Negative for myalgias and arthralgias.  Skin: Positive for rash.  Neurological: Negative for headaches.  All other systems reviewed and are negative.   Home Medications   Prior to Admission medications   Medication Sig Start Date End Date Taking? Authorizing Provider  acetaminophen (TYLENOL) 500 MG tablet Take 1,000 mg by mouth every 6 (six) hours as needed. pain   Yes Historical Provider, MD  cetirizine (ZYRTEC) 10 MG tablet Take 10 mg by mouth as needed. allerigies   Yes Historical Provider, MD  lisinopril (PRINIVIL,ZESTRIL) 2.5 MG tablet Take 2.5 mg by mouth daily.   Yes Historical Provider, MD  famotidine (PEPCID) 20 MG  tablet Take 1 tablet (20 mg total) by mouth 2 (two) times daily. 03/24/15   Artemio Dobie Bunnie Pion, NP  hydrOXYzine (ATARAX/VISTARIL) 25 MG tablet Take 1 tablet (25 mg total) by mouth every 6 (six) hours. 03/24/15   Paxton Binns Bunnie Pion, NP  loratadine (CLARITIN) 10 MG tablet Take 1 tablet (10 mg total) by mouth daily. 03/24/15   Sandeep Radell Bunnie Pion, NP  metroNIDAZOLE (FLAGYL) 500 MG tablet Take 1 tablet (500 mg total) by mouth 2 (two) times daily. Patient not taking: Reported on 03/24/2015 06/28/13   Gregor Hams, MD  predniSONE (DELTASONE) 10 MG tablet Take 2 tablets (20 mg total) by mouth 2 (two) times daily with a meal. 03/24/15   [place], NP    Allergies  Penicillins  Triage Vitals: BP 132/74 mmHg  Pulse 87  Temp(Src) 98.1 F (36.7 C) (Oral)  Resp 16  SpO2 99%  LMP 03/12/2015 (LMP Unknown)  Physical Exam  Constitutional: She is oriented to person, place, and time. She appears well-developed and well-nourished. No distress.  HENT:  Uvula midline with no erythema or edema TMs normal bilaterally Mild bilateral edema of the orbits  Eyes: Conjunctivae and EOM are normal. Pupils are equal, round, and reactive to light. No scleral icterus.  Sclera clear  Neck: Normal range of motion. Neck supple.  Cardiovascular: Normal rate and regular rhythm.   Pulmonary/Chest: Effort normal and breath sounds normal. No respiratory distress. She has no wheezes. She has no rales.  CTAB  Abdominal: Soft. There is no tenderness.  Musculoskeletal: Normal range of motion.  See skin exam  Neurological: She is alert and oriented to person, place, and time. No cranial nerve deficit.  Skin: Skin is warm and dry.  Few hive-like area to the upper extremities  Psychiatric: She has a normal mood and affect. Her behavior is normal.  Nursing note and vitals reviewed.   ED Course  Procedures  DIAGNOSTIC STUDIES: Oxygen Saturation is 99% on RA,  normal by my interpretation.    COORDINATION OF CARE: 6:21 PM Discussed  treatment plan which includes hydroxyzine, Claritin, and Decadron with pt at bedside and pt agreed to the plan.  6:51 PM I re-evaluated the patient and she has just been given the atarax.  Discussed with Dr. Dayna Barker  Patient given Solumedrol 125 mg, Benadryl 25 mg and Pepcid 20 mg IV  After IV medications patient states she feels much better, no itching and few hives.  MDM  39 y.o. female with hives after using a new soap yesterday. Stable for d/c without difficulty breathing and O2 SAT 100% on R/A./ she will return for worsening symptoms. Rx Pepcid, Vistaril, Claritin and prednisone. Discussed with the patient and all questioned fully answered.    Final diagnoses:  Hives    I personally performed the services described in this documentation, which  was scribed in my presence. The recorded information has been reviewed and is accurate.      [place], NP 03/24/15 2100  Merrily Pew, MD 03/24/15 [phone]

## 2015-06-13 ENCOUNTER — Encounter (HOSPITAL_COMMUNITY): Payer: Self-pay | Admitting: Emergency Medicine

## 2015-06-13 ENCOUNTER — Emergency Department (INDEPENDENT_AMBULATORY_CARE_PROVIDER_SITE_OTHER)
Admission: EM | Admit: 2015-06-13 | Discharge: 2015-06-13 | Disposition: A | Discharge status: Home / Self Care | Payer: Managed Care, Other (non HMO) | Source: Home / Self Care | Attending: Emergency Medicine | Admitting: Emergency Medicine

## 2015-06-13 DIAGNOSIS — J069 Acute upper respiratory infection, unspecified: Secondary | ICD-10-CM | POA: Diagnosis not present

## 2015-06-13 DIAGNOSIS — J9801 Acute bronchospasm: Secondary | ICD-10-CM

## 2015-06-13 DIAGNOSIS — R059 Cough, unspecified: Secondary | ICD-10-CM

## 2015-06-13 DIAGNOSIS — Z720 Tobacco use: Secondary | ICD-10-CM

## 2015-06-13 DIAGNOSIS — J3489 Other specified disorders of nose and nasal sinuses: Secondary | ICD-10-CM

## 2015-06-13 DIAGNOSIS — R05 Cough: Secondary | ICD-10-CM

## 2015-06-13 MED ORDER — PREDNISONE 20 MG PO TABS: ORAL_TABLET | ORAL | Status: DC

## 2015-06-13 MED ORDER — ALBUTEROL SULFATE HFA 108 (90 BASE) MCG/ACT IN AERS: 2.0000 | INHALATION_SPRAY | RESPIRATORY_TRACT | Status: DC | PRN

## 2015-06-13 NOTE — Discharge Instructions (Signed)
Bronchospasm, Adult Albuterol HFA 2 puffs every 4 hours as needed for cough and wheeze Take the prednisone taper dose as directed. Take with food this will help swelling in her airways and sinuses. Recommend taking in any histamines such as Allegra, Claritin or Zyrtec. If need something stronger he may take Chlor-Trimeton and 2-4 mg every 4 hours. This may cause drowsiness and he may just want to take this while at home or at bedtime. Use plenty of saline nasal spray Drink plenty of fluids and stay well-hydrated Stop smoking. She develop fevers, shortness of breath, increased cough or worsening in any way sick medical attention promptly. These are signs of pneumonia. A bronchospasm is a spasm or tightening of the airways going into the lungs. During a bronchospasm breathing becomes more difficult because the airways get smaller. When this happens there can be coughing, a whistling sound when breathing (wheezing), and difficulty breathing. Bronchospasm is often associated with asthma, but not all patients who experience a bronchospasm have asthma. CAUSES  A bronchospasm is caused by inflammation or irritation of the airways. The inflammation or irritation may be triggered by:   Allergies (such as to animals, pollen, food, or mold). Allergens that cause bronchospasm may cause wheezing immediately after exposure or many hours later.   Infection. Viral infections are believed to be the most common cause of bronchospasm.   Exercise.   Irritants (such as pollution, cigarette smoke, strong odors, aerosol sprays, and paint fumes).   Weather changes. Winds increase molds and pollens in the air. Rain refreshes the air by washing irritants out. Cold air may cause inflammation.   Stress and emotional upset.  SIGNS AND SYMPTOMS   Wheezing.   Excessive nighttime coughing.   Frequent or severe coughing with a simple cold.   Chest tightness.   Shortness of breath.  DIAGNOSIS    Bronchospasm is usually diagnosed through a history and physical exam. Tests, such as chest X-rays, are sometimes done to look for other conditions. TREATMENT   Inhaled medicines can be given to open up your airways and help you breathe. The medicines can be given using either an inhaler or a nebulizer machine.  Corticosteroid medicines may be given for severe bronchospasm, usually when it is associated with asthma. HOME CARE INSTRUCTIONS   Always have a plan prepared for seeking medical care. Know when to call your health care provider and local emergency services (911 in the [place] Know where you can access local emergency care.  Only take medicines as directed by your health care provider.  If you were prescribed an inhaler or nebulizer machine, ask your health care provider to explain how to use it correctly. Always use a spacer with your inhaler if you were given one.  It is necessary to remain calm during an attack. Try to relax and breathe more slowly.  Control your home environment in the following ways:   Change your heating and air conditioning filter at least once a month.   Limit your use of fireplaces and wood stoves.  Do not smoke and do not allow smoking in your home.   Avoid exposure to perfumes and fragrances.   Get rid of pests (such as roaches and mice) and their droppings.   Throw away plants if you see mold on them.   Keep your house clean and dust free.   Replace carpet with wood, tile, or vinyl flooring. Carpet can trap dander and dust.   Use allergy-proof pillows, mattress covers, and box spring  covers.   Wash bed sheets and blankets every week in hot water and dry them in a dryer.   Use blankets that are made of polyester or cotton.   Wash hands frequently. SEEK MEDICAL CARE IF:   You have muscle aches.   You have chest pain.   The sputum changes from clear or white to yellow, green, gray, or bloody.   The sputum you cough up  gets thicker.   There are problems that may be related to the medicine you are given, such as a rash, itching, swelling, or trouble breathing.  SEEK IMMEDIATE MEDICAL CARE IF:   You have worsening wheezing and coughing even after taking your prescribed medicines.   You have increased difficulty breathing.   You develop severe chest pain. MAKE SURE YOU:   Understand these instructions.  Will watch your condition.  Will get help right away if you are not doing well or get worse.   This information is not intended to replace advice given to you by your health care provider. Make sure you discuss any questions you have with your health care provider.   Document Released: 05/16/2003 Document Revised: 06/03/2014 Document Reviewed: 11/02/2012 Elsevier Interactive Patient Education 2016 Elsevier Inc.  Viral Infections A virus is a type of germ. Viruses can cause:  Minor sore throats.  Aches and pains.  Headaches.  Runny nose.  Rashes.  Watery eyes.  Tiredness.  Coughs.  Loss of appetite.  Feeling sick to your stomach (nausea).  Throwing up (vomiting).  Watery poop (diarrhea). HOME CARE   Only take medicines as told by your doctor.  Drink enough water and fluids to keep your pee (urine) clear or pale yellow. Sports drinks are a good choice.  Get plenty of rest and eat healthy. Soups and broths with crackers or rice are fine. GET HELP RIGHT AWAY IF:   You have a very bad headache.  You have shortness of breath.  You have chest pain or neck pain.  You have an unusual rash.  You cannot stop throwing up.  You have watery poop that does not stop.  You cannot keep fluids down.  You or your child has a temperature by mouth above 102 F (38.9 C), not controlled by medicine.  Your baby is older than 3 months with a rectal temperature of 102 F (38.9 C) or higher.  Your baby is 54 months old or younger with a rectal temperature of 100.4 F (38 C) or  higher. MAKE SURE YOU:   Understand these instructions.  Will watch this condition.  Will get help right away if you are not doing well or get worse.   This information is not intended to replace advice given to you by your health care provider. Make sure you discuss any questions you have with your health care provider.   Document Released: 04/25/2008 Document Revised: 08/05/2011 Document Reviewed: 10/19/2014 Elsevier Interactive Patient Education 2016 [employer].  Smoking Cessation, Tips for Success If you are ready to quit smoking, congratulations! You have chosen to help yourself be healthier. Cigarettes bring nicotine, tar, carbon monoxide, and other irritants into your body. Your lungs, heart, and blood vessels will be able to work better without these poisons. There are many different ways to quit smoking. Nicotine gum, nicotine patches, a nicotine inhaler, or nicotine nasal spray can help with physical craving. Hypnosis, support groups, and medicines help break the habit of smoking. WHAT THINGS CAN I DO TO MAKE QUITTING EASIER?  Here  are some tips to help you quit for good:  Pick a date when you will quit smoking completely. Tell all of your friends and family about your plan to quit on that date.  Do not try to slowly cut down on the number of cigarettes you are smoking. Pick a quit date and quit smoking completely starting on that day.  Throw away all cigarettes.   Clean and remove all ashtrays from your home, work, and car.  On a card, write down your reasons for quitting. Carry the card with you and read it when you get the urge to smoke.  Cleanse your body of nicotine. Drink enough water and fluids to keep your urine clear or pale yellow. Do this after quitting to flush the nicotine from your body.  Learn to predict your moods. Do not let a bad situation be your excuse to have a cigarette. Some situations in your life might tempt you into wanting a cigarette.  Never  have "just one" cigarette. It leads to wanting another and another. Remind yourself of your decision to quit.  Change habits associated with smoking. If you smoked while driving or when feeling stressed, try other activities to replace smoking. Stand up when drinking your coffee. Brush your teeth after eating. Sit in a different chair when you read the paper. Avoid alcohol while trying to quit, and try to drink fewer caffeinated beverages. Alcohol and caffeine may urge you to smoke.  Avoid foods and drinks that can trigger a desire to smoke, such as sugary or spicy foods and alcohol.  Ask people who smoke not to smoke around you.  Have something planned to do right after eating or having a cup of coffee. For example, plan to take a walk or exercise.  Try a relaxation exercise to calm you down and decrease your stress. Remember, you may be tense and nervous for the first 2 weeks after you quit, but this will pass.  Find new activities to keep your hands busy. Play with a pen, coin, or rubber band. Doodle or draw things on paper.  Brush your teeth right after eating. This will help cut down on the craving for the taste of tobacco after meals. You can also try mouthwash.   Use oral substitutes in place of cigarettes. Try using lemon drops, carrots, cinnamon sticks, or chewing gum. Keep them handy so they are available when you have the urge to smoke.  When you have the urge to smoke, try deep breathing.  Designate your home as a nonsmoking area.  If you are a heavy smoker, ask your health care provider about a prescription for nicotine chewing gum. It can ease your withdrawal from nicotine.  Reward yourself. Set aside the cigarette money you save and buy yourself something nice.  Look for support from others. Join a support group or smoking cessation program. Ask someone at home or at work to help you with your plan to quit smoking.  Always ask yourself, "Do I need this cigarette or is this  just a reflex?" Tell yourself, "Today, I choose not to smoke," or "I do not want to smoke." You are reminding yourself of your decision to quit.  Do not replace cigarette smoking with electronic cigarettes (commonly called e-cigarettes). The safety of e-cigarettes is unknown, and some may contain harmful chemicals.  If you relapse, do not give up! Plan ahead and think about what you will do the next time you get the urge to smoke. HOW  WILL I FEEL WHEN I QUIT SMOKING? You may have symptoms of withdrawal because your body is used to nicotine (the addictive substance in cigarettes). You may crave cigarettes, be irritable, feel very hungry, cough often, get headaches, or have difficulty concentrating. The withdrawal symptoms are only temporary. They are strongest when you first quit but will go away within 10-14 days. When withdrawal symptoms occur, stay in control. Think about your reasons for quitting. Remind yourself that these are signs that your body is healing and getting used to being without cigarettes. Remember that withdrawal symptoms are easier to treat than the major diseases that smoking can cause.  Even after the withdrawal is over, expect periodic urges to smoke. However, these cravings are generally short lived and will go away whether you smoke or not. Do not smoke! WHAT RESOURCES ARE AVAILABLE TO HELP ME QUIT SMOKING? Your health care provider can direct you to community resources or hospitals for support, which may include:  Group support.  Education.  Hypnosis.  Therapy.   This information is not intended to replace advice given to you by your health care provider. Make sure you discuss any questions you have with your health care provider.   Document Released: 02/09/2004 Document Revised: 06/03/2014 Document Reviewed: 10/29/2012 Elsevier Interactive Patient Education [employer].

## 2015-06-13 NOTE — ED Notes (Signed)
Cough, weakness, chest congestion-onset one week ago.

## 2015-06-13 NOTE — ED Provider Notes (Signed)
CSN: [id number]     Arrival date & time 06/13/15  1924 History   First MD Initiated Contact with Patient 06/13/15 2009     Chief Complaint  Patient presents with  . URI   (Consider location/radiation/quality/duration/timing/severity/associated sxs/prior Treatment) HPI Comments: 40 year old female complaining of a cough for one week. She is also feeling weaker. When she coughs she has pain in the bilateral ribs. She also complaining of runny nose and PND. Denies fevers at home. He is complaining of pain in the left ear and feels like popping off and on.  Patient is a 40 y.o. female presenting with URI.  URI Presenting symptoms: cough and rhinorrhea   Presenting symptoms: no fatigue, no fever and no sore throat   Associated symptoms: no neck pain     Past Medical History  Diagnosis Date  . Herpes genitalia     2013 - 1st outbreak  . Trichomonas   . Twins   . Hypertension     2008  . Gestational diabetes     Neg 2 hr test after preg  . Seasonal allergies   . Headache(784.0)   . Pre-eclampsia in third trimester   . Shortness of breath     w exertion  . Anemia     hx    Past Surgical History  Procedure Laterality Date  . Cesarean section      X 2  . Cesarean section  04/21/2012    Procedure: CESAREAN SECTION;  Surgeon: Woodroe Mode, MD;  Location: [facility];  Service: Obstetrics;  Laterality: N/A;   Family History  Problem Relation Age of Onset  . Hypertension Mother   . Stroke Maternal Grandmother   . Heart disease Maternal Grandfather    Social History  Substance Use Topics  . Smoking status: Former Smoker -- 0.50 packs/day for 17 years    Types: Cigarettes    Quit date: 10/11/2011  . Smokeless tobacco: Never Used  . Alcohol Use: 0.0 oz/week     Comment: socially but none with pregnancy   OB History    Gravida Para Term Preterm AB TAB SAB Ectopic Multiple Living   4 3 1 2  0 0 0 0 2 4     Review of Systems  Constitutional: Positive for activity change.  Negative for fever, chills, appetite change and fatigue.  HENT: Positive for postnasal drip and rhinorrhea. Negative for facial swelling and sore throat.   Eyes: Negative.   Respiratory: Positive for cough. Negative for shortness of breath.   Cardiovascular: Negative.   Gastrointestinal: Negative.   Genitourinary: Negative.   Musculoskeletal: Negative.  Negative for neck pain and neck stiffness.  Skin: Negative for pallor and rash.  Neurological: Negative.     Allergies  Penicillins  Home Medications   Prior to Admission medications   Medication Sig Start Date End Date Taking? Authorizing Provider  Chlorphen-Pseudoephed-APAP [facility] FLU/COLD PO) Take by mouth.   Yes Historical Provider, MD  Pseudoeph-Doxylamine-DM-APAP (NYQUIL PO) Take by mouth.   Yes Historical Provider, MD  Pseudoephedrine-APAP-DM (DAYQUIL PO) Take by mouth.   Yes Historical Provider, MD  acetaminophen (TYLENOL) 500 MG tablet Take 1,000 mg by mouth every 6 (six) hours as needed. pain    Historical Provider, MD  albuterol (PROVENTIL HFA;VENTOLIN HFA) 108 (90 Base) MCG/ACT inhaler Inhale 2 puffs into the lungs every 4 (four) hours as needed for wheezing or shortness of breath. 06/13/15   Janne Napoleon, NP  cetirizine (ZYRTEC) 10 MG tablet Take 10 mg by  mouth as needed. allerigies    Historical Provider, MD  famotidine (PEPCID) 20 MG tablet Take 1 tablet (20 mg total) by mouth 2 (two) times daily. 03/24/15   Hope Bunnie Pion, NP  hydrOXYzine (ATARAX/VISTARIL) 25 MG tablet Take 1 tablet (25 mg total) by mouth every 6 (six) hours. 03/24/15   Hope Bunnie Pion, NP  lisinopril (PRINIVIL,ZESTRIL) 2.5 MG tablet Take 2.5 mg by mouth daily.    Historical Provider, MD  loratadine (CLARITIN) 10 MG tablet Take 1 tablet (10 mg total) by mouth daily. 03/24/15   Hope Bunnie Pion, NP  predniSONE (DELTASONE) 20 MG tablet Take 3 tabs po on first day, 2 tabs second day, 2 tabs third day, 1 tab fourth day, 1 tab 5th day. Take with food. 06/13/15   Janne Napoleon, NP   Meds Ordered and Administered this Visit  Medications - No data to display  BP 130/86 mmHg  Pulse 92  Temp(Src) 99.4 F (37.4 C) (Oral)  Resp 20  SpO2 99%  LMP 06/11/2015 No data found.   Physical Exam  Constitutional: She is oriented to person, place, and time. She appears well-developed and well-nourished. No distress.  HENT:  Bilateral TMs are mildly retracted. No erythema. Oropharynx with minor erythema and scant clear PND. No exudates.  Eyes: Conjunctivae and EOM are normal.  Neck: Normal range of motion. Neck supple.  Cardiovascular: Normal rate, regular rhythm and normal heart sounds.   Pulmonary/Chest: Effort normal.  Tidal volume respirations are clear. Forced deep expirations produced faint distant light wheeze. Forced cough produces faint distant coarseness.  Musculoskeletal: Normal range of motion. She exhibits no edema.  Lymphadenopathy:    She has no cervical adenopathy.  Neurological: She is alert and oriented to person, place, and time. She exhibits normal muscle tone.  Skin: Skin is warm and dry.  Psychiatric: She has a normal mood and affect.  Nursing note and vitals reviewed.   ED Course  Procedures (including critical care time)  Labs Review Labs Reviewed - No data to display  Imaging Review No results found.   Visual Acuity Review  Right Eye Distance:   Left Eye Distance:   Bilateral Distance:    Right Eye Near:   Left Eye Near:    Bilateral Near:         MDM   1. URI (upper respiratory infection)   2. Sinus drainage   3. Cough   4. Bronchospasm   5. Tobacco abuse disorder    Bronchospasm, Adult Albuterol HFA 2 puffs every 4 hours as needed for cough and wheeze Take the prednisone taper dose as directed. Take with food.  this will help swelling in your airways and sinuses. Recommend taking anti histamines such as Allegra, Claritin or Zyrtec. If need something stronger you may take Chlor-Trimeton and 2-4 mg every 4  hours. This may cause drowsiness and you may just want to take this while at home or at bedtime. Use plenty of saline nasal spray Drink plenty of fluids and stay well-hydrated Stop smoking. If you develop fevers, shortness of breath, increased cough or worsening in any way seek medical attention promptly. These are signs of pneumonia.    Janne Napoleon, NP 06/13/15 2030  Janne Napoleon, NP 06/13/15 2030

## 2015-08-09 ENCOUNTER — Encounter (HOSPITAL_COMMUNITY): Payer: Self-pay | Admitting: Emergency Medicine

## 2015-08-09 ENCOUNTER — Emergency Department (HOSPITAL_COMMUNITY)
Admission: EM | Admit: 2015-08-09 | Discharge: 2015-08-09 | Disposition: A | Discharge status: Home / Self Care | Payer: Managed Care, Other (non HMO) | Attending: Emergency Medicine | Admitting: Emergency Medicine

## 2015-08-09 DIAGNOSIS — Z7952 Long term (current) use of systemic steroids: Secondary | ICD-10-CM | POA: Insufficient documentation

## 2015-08-09 DIAGNOSIS — Z862 Personal history of diseases of the blood and blood-forming organs and certain disorders involving the immune mechanism: Secondary | ICD-10-CM | POA: Insufficient documentation

## 2015-08-09 DIAGNOSIS — K029 Dental caries, unspecified: Secondary | ICD-10-CM | POA: Insufficient documentation

## 2015-08-09 DIAGNOSIS — Z8619 Personal history of other infectious and parasitic diseases: Secondary | ICD-10-CM | POA: Insufficient documentation

## 2015-08-09 DIAGNOSIS — Z79899 Other long term (current) drug therapy: Secondary | ICD-10-CM | POA: Insufficient documentation

## 2015-08-09 DIAGNOSIS — I10 Essential (primary) hypertension: Secondary | ICD-10-CM | POA: Insufficient documentation

## 2015-08-09 DIAGNOSIS — K002 Abnormalities of size and form of teeth: Secondary | ICD-10-CM | POA: Insufficient documentation

## 2015-08-09 DIAGNOSIS — Z8632 Personal history of gestational diabetes: Secondary | ICD-10-CM | POA: Insufficient documentation

## 2015-08-09 DIAGNOSIS — Z880 Allergy status to penicillin: Secondary | ICD-10-CM | POA: Insufficient documentation

## 2015-08-09 DIAGNOSIS — K0889 Other specified disorders of teeth and supporting structures: Secondary | ICD-10-CM | POA: Insufficient documentation

## 2015-08-09 MED ORDER — CLINDAMYCIN HCL 150 MG PO CAPS: 300.0000 mg | ORAL_CAPSULE | Freq: Three times a day (TID) | ORAL | Status: DC

## 2015-08-09 MED ORDER — IBUPROFEN 800 MG PO TABS: 800.0000 mg | ORAL_TABLET | Freq: Three times a day (TID) | ORAL | Status: DC

## 2015-08-09 MED ORDER — BUPIVACAINE-EPINEPHRINE (PF) 0.5% -1:200000 IJ SOLN
1.8000 mL | Freq: Once | INTRAMUSCULAR | Status: AC
Start: 2015-08-09 — End: 2015-08-09
  Administered 2015-08-09: 1.8 mL
  Filled 2015-08-09: qty 1.8

## 2015-08-09 MED ORDER — CLINDAMYCIN HCL 150 MG PO CAPS
300.0000 mg | ORAL_CAPSULE | Freq: Once | ORAL | Status: AC
Start: 2015-08-09 — End: 2015-08-09
  Administered 2015-08-09: 300 mg via ORAL
  Filled 2015-08-09: qty 2

## 2015-08-09 NOTE — Discharge Instructions (Signed)
Take medications as prescribed. I recommend eating prior to taking ibuprofen to prevent GI side effects. You may also apply ice to affected area for 15-20 minutes 3-4 times daily to help with pain and swelling. Follow-up with one of the dental clinics listed below regarding further management of your dental pain. Return to the emergency department if symptoms worsen or new onset of fever, facial/neck swelling, difficulty swallowing resulting in drooling, difficulty breathing, drainage.  [facility] [address] [place], [place] [address] Phone [phone]  [facility] in [place], [place], exemplifies the [employer] vision to improve the health and quality of life of all [place] by Regulatory affairs officer with a passion to care for the underserved and by leading the nation in community-based, service learning oral health education.  We are committed to offering comprehensive general dental services for adults, children and special needs patients in a safe, caring and professional setting.   Appointments: Our clinic is open Monday through Friday 8:00 a.m. until 5:00 p.m. The amount of time scheduled for an appointment depends on the patients specific needs. We ask that you keep your appointed time for care or provide 24-hour notice of all appointment changes. Parents or legal guardians must accompany minor children.   Payment for Services: Medicaid and other insurance plans are welcome. Payment for services is due when services are rendered and may be made by cash or credit card. If you have dental insurance, we will assist you with your claim submission.    Emergencies:  Emergency services will be provided Monday through Friday on a walk-in basis.  Please arrive early for emergency services. After hours  emergency services will be provided for patients of record as required.   Services:  Personnel officer Dentistry Oral Surgery - Extractions Root Canals Sealants and Tooth Colored Fillings Crowns and Bridges Dentures and Partial Dentures Implant Services Periodontal Services and Cleanings Cosmetic Risk manager [employer] [facility]   [facility] [employer] [employer] is a great source of information about community services available.  Access by dialing 2-1-1 from anywhere in [place], or by website -  CustodianSupply.fi.   Other Local Resources (Updated 05/2015)  Dental  Care   Services    Phone Number and Address  Cost  [facility] For children 79 - 68 years of age:   Cleaning  Tooth brushing/flossing instruction  Sealants, fillings, crowns  Extractions  Emergency treatment  [phone] [address], [place] [address] Charges based on family income.  Medicaid and some insurance plans accepted.     [facility] - [facility], fillings, crowns  Extractions  Emergency treatment [phone] [place], [place]  Pregnant women 55 years of age or older with a Medicaid card  [facility] - [place]  Cleaning  Sealants, fillings, crowns  Extractions  Emergency treatment [phone] [address] [place], [place] Pregnant women 83 years of age or older with a Medicaid card  [facility] For children 58 - 29 years of age:   Cleaning  Tooth brushing/flossing instruction  Sealants, fillings, crowns  Extractions  Emergency treatment Limited orthodontic services for patients with Medicaid [id number] [address] [place] [place], [place] [address] Medicaid and [place]  Health Choice cover for children  up to age 26 and pregnant women.  Parents of children up to age 40 without Medicaid pay a reduced fee at time of service.  [facility] For children 40 - 46 years of age:   Cleaning  Tooth brushing/flossing instruction  Sealants, fillings, crowns  Extractions  Emergency treatment Limited orthodontic services for patients with Medicaid [phone] [place], [place].  Medicaid and [place] Health Choice cover for children up to age 75 and pregnant women.  Parents of children up to age 45 without Medicaid pay a reduced fee.  [facility]  Sealants, fillings, crowns  Extractions  Hours: Tuesdays and Thursdays, 4:15 - 8 pm [phone] [address] [place], [place] [address] Services free of charge to [facility] residents ages 18-64 who do not have health insurance, Medicare, [place], or [place] benefits and fall within federal poverty guidelines  [place] care in addition to primary medical care, nutritional counseling, and pharmacy:  Engineer, drilling, fillings, crowns  Extractions                  [phone] [facility], [place], [place], [place], [place], [place] [facility], [place], [place] [facility] [place], [place], [place], most insurance.  Also provides services available to all with fees adjusted based on ability to pay.    [facility]  Cleaning  Tooth brushing/flossing instruction  Sealants, fillings, crowns  Extractions  Emergency treatment Hours: Tuesdays, Thursdays,  and Fridays from 8 am to 5 pm by appointment only. [phone] [place], [place] [address] [facility] residents with Medicaid (depending on eligibility) and children with [facility] Health Choice - call for more information.  [facility]  Extractions only  Hours: 2nd and 4th Thursday of each month from 6:30 am - 9 am.   [phone] ext. [place], [place] [address] Ages 27 and older only.  Patients are seen on a first come, first served basis.  [employer]  [employer]  Extractions  Orthodontics  Endodontics  Implants/Crowns/Bridges  Complete and partial dentures [phone] [place],  Patients must complete an application for services.  There is often a waiting list.

## 2015-08-09 NOTE — ED Notes (Signed)
Pt in reporting L lower dental pain. No swelling, redness noted

## 2015-08-09 NOTE — ED Notes (Signed)
See EDP assessment 

## 2015-08-09 NOTE — ED Provider Notes (Signed)
CSN: [id number]     Arrival date & time 08/09/15  2259 History  By signing my name below, I, Stephania Fragmin, attest that this documentation has been prepared under the direction and in the presence of Harlene Ramus, [place]. Electronically Signed: Stephania Fragmin, ED Scribe. 08/09/2015. 11:38 PM.    Chief Complaint  Patient presents with  . Dental Pain   The history is provided by the patient. No language interpreter was used.    HPI Comments: Robin Fox is a 40 y.o. female with a history of hypertension, who presents to the Emergency Department complaining of gradual-onset, constant, 10/10 left lower dental pain radiating to her left-sided jaw that began this morning. She notes associated gingival swelling and left-sided jaw swelling. Patient states she had tried taking Tylenol and aspirin at home with mild relief. This is a new problem. She denies any fever, drooling, drainage, shortness of breath, facial/neck swelling or difficulty breathing. Patient states she has known allergies to penicillin, which caused hives.   Past Medical History  Diagnosis Date  . Herpes genitalia     2013 - 1st outbreak  . Trichomonas   . Twins   . Hypertension     2008  . Gestational diabetes     Neg 2 hr test after preg  . Seasonal allergies   . Headache(784.0)   . Pre-eclampsia in third trimester   . Shortness of breath     w exertion  . Anemia     hx    Past Surgical History  Procedure Laterality Date  . Cesarean section      X 2  . Cesarean section  04/21/2012    Procedure: CESAREAN SECTION;  Surgeon: Woodroe Mode, MD;  Location: [facility];  Service: Obstetrics;  Laterality: N/A;   Family History  Problem Relation Age of Onset  . Hypertension Mother   . Stroke Maternal Grandmother   . Heart disease Maternal Grandfather    Social History  Substance Use Topics  . Smoking status: Former Smoker -- 0.50 packs/day for 17 years    Types: Cigarettes    Quit date: 10/11/2011  . Smokeless  tobacco: Never Used  . Alcohol Use: 0.0 oz/week     Comment: socially but none with pregnancy   OB History    Gravida Para Term Preterm AB TAB SAB Ectopic Multiple Living   4 3 1 2  0 0 0 0 2 4     Review of Systems  Constitutional: Negative for fever.  HENT: Positive for dental problem and facial swelling (near left-sided jaw). Negative for drooling.   Respiratory: Negative for shortness of breath.    Allergies  Penicillins  Home Medications   Prior to Admission medications   Medication Sig Start Date End Date Taking? Authorizing Provider  acetaminophen (TYLENOL) 500 MG tablet Take 1,000 mg by mouth every 6 (six) hours as needed. pain    Historical Provider, MD  albuterol (PROVENTIL HFA;VENTOLIN HFA) 108 (90 Base) MCG/ACT inhaler Inhale 2 puffs into the lungs every 4 (four) hours as needed for wheezing or shortness of breath. 06/13/15   Janne Napoleon, NP  cetirizine (ZYRTEC) 10 MG tablet Take 10 mg by mouth as needed. allerigies    Historical Provider, MD  Chlorphen-Pseudoephed-APAP [facility] FLU/COLD PO) Take by mouth.    Historical Provider, MD  clindamycin (CLEOCIN) 150 MG capsule Take 2 capsules (300 mg total) by mouth 3 (three) times daily. May dispense as 150mg  capsules 08/09/15   Nona Dell, PA-C  famotidine (PEPCID) 20 MG tablet Take 1 tablet (20 mg total) by mouth 2 (two) times daily. 03/24/15   Hope Bunnie Pion, NP  hydrOXYzine (ATARAX/VISTARIL) 25 MG tablet Take 1 tablet (25 mg total) by mouth every 6 (six) hours. 03/24/15   Hope Bunnie Pion, NP  ibuprofen (ADVIL,MOTRIN) 800 MG tablet Take 1 tablet (800 mg total) by mouth 3 (three) times daily. 08/09/15   Nona Dell, PA-C  lisinopril (PRINIVIL,ZESTRIL) 2.5 MG tablet Take 2.5 mg by mouth daily.    Historical Provider, MD  loratadine (CLARITIN) 10 MG tablet Take 1 tablet (10 mg total) by mouth daily. 03/24/15   Hope Bunnie Pion, NP  predniSONE (DELTASONE) 20 MG tablet Take 3 tabs po on first day, 2 tabs second day, 2  tabs third day, 1 tab fourth day, 1 tab 5th day. Take with food. 06/13/15   Janne Napoleon, NP  Pseudoeph-Doxylamine-DM-APAP (NYQUIL PO) Take by mouth.    Historical Provider, MD  Pseudoephedrine-APAP-DM (DAYQUIL PO) Take by mouth.    Historical Provider, MD   BP 137/86 mmHg  Pulse 85  Temp(Src) 97.3 F (36.3 C) (Oral)  Resp 18  Ht 5' 7.5" (1.715 m)  Wt 99.791 kg  BMI 33.93 kg/m2  SpO2 100% Physical Exam  Constitutional: She is oriented to person, place, and time. She appears well-developed and well-nourished.  HENT:  Head: Normocephalic and atraumatic.  Mouth/Throat: Uvula is midline, oropharynx is clear and moist and mucous membranes are normal. No trismus in the jaw. Abnormal dentition. Dental caries present. No dental abscesses or uvula swelling. No oropharyngeal exudate, posterior oropharyngeal edema, posterior oropharyngeal erythema or tonsillar abscesses.    No facial or neck swelling.   Eyes: Conjunctivae and EOM are normal. Right eye exhibits no discharge. Left eye exhibits no discharge. No scleral icterus.  Neck: Normal range of motion. Neck supple.  Cardiovascular: Normal rate.   Pulmonary/Chest: Effort normal. No stridor. No respiratory distress.  Lymphadenopathy:    She has no cervical adenopathy.  Neurological: She is alert and oriented to person, place, and time.  Nursing note and vitals reviewed.   ED Course  .Nerve Block Date/Time: 08/09/2015 11:44 PM Performed by: Nona Dell Authorized by: Nona Dell Consent: Verbal consent obtained. Risks and benefits: risks, benefits and alternatives were discussed Consent given by: patient Patient understanding: patient states understanding of the procedure being performed Patient identity confirmed: verbally with patient Indications: pain relief Body area: face/mouth Nerve: inferior alveolar Laterality: left Patient position: sitting Needle gauge: 1 G Location technique: anatomical  landmarks Local anesthetic: bupivacaine 0.5% with epinephrine Anesthetic total: 1.8 ml Outcome: pain improved Patient tolerance: Patient tolerated the procedure well with no immediate complications   (including critical care time)  DIAGNOSTIC STUDIES: Oxygen Saturation is 100% on RA, normal by my interpretation.    COORDINATION OF CARE: 11:33 PM - Discussed treatment plan with pt at bedside which includes dental block. Pt verbalized understanding and agreed to plan.    MDM   Final diagnoses:  Pain, dental   Patient with dentalgia.  No abscess requiring immediate incision and drainage.  Exam not concerning for Ludwig's angina or pharyngeal abscess.  Patient given dental block in the ED without any complications. Will treat with clindamycin and ibuprofen. Pt instructed to follow-up with dentist. Discussed return precautions. Pt safe for discharge.  I personally performed the services described in this documentation, which was scribed in my presence. The recorded information has been reviewed and is accurate.  Chesley Noon [place], [place] 08/10/15 0001  Sharlett Iles, MD 08/10/15 [phone]

## 2015-09-13 ENCOUNTER — Emergency Department (HOSPITAL_COMMUNITY)
Admission: EM | Admit: 2015-09-13 | Discharge: 2015-09-14 | Disposition: A | Discharge status: Home / Self Care | Payer: Self-pay | Attending: Emergency Medicine | Admitting: Emergency Medicine

## 2015-09-13 ENCOUNTER — Encounter (HOSPITAL_COMMUNITY): Payer: Self-pay | Admitting: Emergency Medicine

## 2015-09-13 DIAGNOSIS — I10 Essential (primary) hypertension: Secondary | ICD-10-CM | POA: Insufficient documentation

## 2015-09-13 NOTE — ED Notes (Signed)
Pt sts HA x 3 days not relieved by OTC meds; pt sts noted htn at times with hx of same and not on meds currently

## 2015-09-13 NOTE — ED Notes (Signed)
Pt stated that she was going to try to go primary care doctor. Pt encouraged to return to ED if symptoms worsened.

## 2015-10-12 ENCOUNTER — Encounter (HOSPITAL_COMMUNITY): Payer: Self-pay | Admitting: Family Medicine

## 2015-10-12 ENCOUNTER — Ambulatory Visit (HOSPITAL_COMMUNITY)
Admission: EM | Admit: 2015-10-12 | Discharge: 2015-10-12 | Disposition: A | Discharge status: Home / Self Care | Payer: BLUE CROSS/BLUE SHIELD | Attending: Family Medicine | Admitting: Family Medicine

## 2015-10-12 DIAGNOSIS — S838X2A Sprain of other specified parts of left knee, initial encounter: Secondary | ICD-10-CM

## 2015-10-12 DIAGNOSIS — S8392XA Sprain of unspecified site of left knee, initial encounter: Secondary | ICD-10-CM | POA: Diagnosis not present

## 2015-10-12 DIAGNOSIS — M25362 Other instability, left knee: Secondary | ICD-10-CM | POA: Diagnosis not present

## 2015-10-12 DIAGNOSIS — N946 Dysmenorrhea, unspecified: Secondary | ICD-10-CM

## 2015-10-12 MED ORDER — MELOXICAM 15 MG PO TABS: 7.5000 mg | ORAL_TABLET | Freq: Every day | ORAL | Status: DC

## 2015-10-12 NOTE — ED Provider Notes (Signed)
CSN: [id number]     Arrival date & time 10/12/15  1725 History   None    Chief Complaint  Patient presents with  . Abdominal Pain  . Knee Pain  . Back Pain   (Consider location/radiation/quality/duration/timing/severity/associated sxs/prior Treatment) HPI  1 day of abd cramping, menstrual bleeding, h/o cysts???. Pt cycles are irregular. Ibuprofen 400 w/o much improvement. Denies fevers, dysuria, frequency, diarrhea, constipation, nausea, vomiting, chest, palpitations.  Pt endorses a high level of stress w/ work and thiniks this is contributing  L knee pain: on hours 10hrs daily for work. [place] 2 years ago which started her intermittent pain. Pain is worse during the day and associated in the day. Improves overnight. Ibuprofen with minimal relief. Pain does not radiate  Past Medical History  Diagnosis Date  . Herpes genitalia     2013 - 1st outbreak  . Trichomonas   . Twins   . Hypertension     2008  . Gestational diabetes     Neg 2 hr test after preg  . Seasonal allergies   . Headache(784.0)   . Pre-eclampsia in third trimester   . Shortness of breath     w exertion  . Anemia     hx    Past Surgical History  Procedure Laterality Date  . Cesarean section      X 2  . Cesarean section  04/21/2012    Procedure: CESAREAN SECTION;  Surgeon: Woodroe Mode, MD;  Location: [facility];  Service: Obstetrics;  Laterality: N/A;   Family History  Problem Relation Age of Onset  . Hypertension Mother   . Stroke Maternal Grandmother   . Heart disease Maternal Grandfather    Social History  Substance Use Topics  . Smoking status: Former Smoker -- 0.50 packs/day for 17 years    Types: Cigarettes    Quit date: 10/11/2011  . Smokeless tobacco: Never Used  . Alcohol Use: 0.0 oz/week     Comment: socially but none with pregnancy   OB History    Gravida Para Term Preterm AB TAB SAB Ectopic Multiple Living   4 3 1 2  0 0 0 0 2 4     Review of Systems Per HPI with all other pertinent  systems negative.   Allergies  Penicillins  Home Medications   Prior to Admission medications   Medication Sig Start Date End Date Taking? Authorizing Provider  acetaminophen (TYLENOL) 500 MG tablet Take 1,000 mg by mouth every 6 (six) hours as needed. pain    Historical Provider, MD  albuterol (PROVENTIL HFA;VENTOLIN HFA) 108 (90 Base) MCG/ACT inhaler Inhale 2 puffs into the lungs every 4 (four) hours as needed for wheezing or shortness of breath. 06/13/15   Janne Napoleon, NP  cetirizine (ZYRTEC) 10 MG tablet Take 10 mg by mouth as needed. allerigies    Historical Provider, MD  Chlorphen-Pseudoephed-APAP [facility] FLU/COLD PO) Take by mouth.    Historical Provider, MD  famotidine (PEPCID) 20 MG tablet Take 1 tablet (20 mg total) by mouth 2 (two) times daily. 03/24/15   Hope Bunnie Pion, NP  hydrOXYzine (ATARAX/VISTARIL) 25 MG tablet Take 1 tablet (25 mg total) by mouth every 6 (six) hours. 03/24/15   Hope Bunnie Pion, NP  ibuprofen (ADVIL,MOTRIN) 800 MG tablet Take 1 tablet (800 mg total) by mouth 3 (three) times daily. 08/09/15   Nona Dell, PA-C  lisinopril (PRINIVIL,ZESTRIL) 2.5 MG tablet Take 2.5 mg by mouth daily.    Historical Provider, MD  loratadine (  CLARITIN) 10 MG tablet Take 1 tablet (10 mg total) by mouth daily. 03/24/15   Hope Bunnie Pion, NP  meloxicam (MOBIC) 15 MG tablet Take 0.5-1 tablets (7.5-15 mg total) by mouth daily. 10/12/15   Waldemar Dickens, MD   Meds Ordered and Administered this Visit  Medications - No data to display  BP 139/92 mmHg  Pulse 79  Temp(Src) 98.6 F (37 C) (Oral)  Resp 16  SpO2 99% No data found.   Physical Exam Physical Exam  Constitutional: oriented to person, place, and time. appears well-developed and well-nourished. No distress.  HENT:  Head: Normocephalic and atraumatic.  Eyes: EOMI. PERRL.  Neck: Normal range of motion.  Cardiovascular: RRR, no m/r/g, 2+ distal pulses,  Pulmonary/Chest: Effort normal and breath sounds normal. No  respiratory distress.  Abdominal: Soft. Bowel sounds are normal. NonTTP, no distension.  Musculoskeletal: Left knee after our OM, mild effusion noted, crepitus appreciated, minimal medial joint line tenderness to palpation, apprehension test negative, Lachman's negative  Neurological: alert and oriented to person, place, and time.  Skin: Skin is warm. No rash noted. non diaphoretic.  Psychiatric: normal mood and affect. behavior is normal. Judgment and thought content normal.   ED Course  Procedures (including critical care time)  Labs Review Labs Reviewed - No data to display  Imaging Review No results found.   Visual Acuity Review  Right Eye Distance:   Left Eye Distance:   Bilateral Distance:    Right Eye Near:   Left Eye Near:    Bilateral Near:         MDM   1. Menstrual cramps   2. Meniscal injury, left, initial encounter   3. Patellar instability, left    Meloxicam 15 mg daily for menstrual as well as for the pain and inflammation. Ice as needed. Knee brace. Patient to start exercises to strengthen the quad muscles specifically the vastus medialis. Patient to follow-up with sports medicine/with [facility] as well as with OB/GYN.    Waldemar Dickens, MD 10/12/15 [phone]

## 2015-10-12 NOTE — Discharge Instructions (Signed)
Your left knee pain is likely from long-standing injury to the cartilage layer and the middle called the meniscus. It is also likely due to weakness of some of the quad muscles causing instability of the knee. Please perform the exercises as outlined below. Please use the meloxicam for 1-2 weeks for pain and swelling. Please ice her knee every night after work. Please use your knee brace every day. Additionally you're suffering from significant menstrual cramping. This may be due to underlying abnormalities with her reproductive system such as fibroids or endometriosis. Please follow-up with your OB/GYN. Please use the meloxicam as well for your pain. If your symptoms get worse please go to the emergency room. If the meloxicam bothers her stomach please take Zantac. Please drink plenty of water while taking the meloxicam.  Generic Knee Exercises EXERCISES RANGE OF MOTION (ROM) AND STRETCHING EXERCISES These exercises may help you when beginning to rehabilitate your injury. Your symptoms may resolve with or without further involvement from your physician, physical therapist, or athletic trainer. While completing these exercises, remember:   Restoring tissue flexibility helps normal motion to return to the joints. This allows healthier, less painful movement and activity.  An effective stretch should be held for at least 30 seconds.  A stretch should never be painful. You should only feel a gentle lengthening or release in the stretched tissue. STRETCH - Knee Extension, Prone  Lie on your stomach on a firm surface, such as a bed or countertop. Place your right / left knee and leg just beyond the edge of the surface. You may wish to place a towel under the far end of your right / left thigh for comfort.  Relax your leg muscles and allow gravity to straighten your knee. Your clinician may advise you to add an ankle weight if more resistance is helpful for you.  You should feel a stretch in the back of  your right / left knee. Hold this position for __________ seconds. Repeat __________ times. Complete this stretch __________ times per day. * Your physician, physical therapist, or athletic trainer may ask you to add ankle weight to enhance your stretch.  RANGE OF MOTION - Knee Flexion, Active  Lie on your back with both knees straight. (If this causes back discomfort, bend your opposite knee, placing your foot flat on the floor.)  Slowly slide your heel back toward your buttocks until you feel a gentle stretch in the front of your knee or thigh.  Hold for __________ seconds. Slowly slide your heel back to the starting position. Repeat __________ times. Complete this exercise __________ times per day.  STRETCH - Quadriceps, Prone   Lie on your stomach on a firm surface, such as a bed or padded floor.  Bend your right / left knee and grasp your ankle. If you are unable to reach your ankle or pant leg, use a belt around your foot to lengthen your reach.  Gently pull your heel toward your buttocks. Your knee should not slide out to the side. You should feel a stretch in the front of your thigh and/or knee.  Hold this position for __________ seconds. Repeat __________ times. Complete this stretch __________ times per day.  STRETCH - Hamstrings, Supine   Lie on your back. Loop a belt or towel over the ball of your right / left foot.  Straighten your right / left knee and slowly pull on the belt to raise your leg. Do not allow the right / left knee to  bend. Keep your opposite leg flat on the floor.  Raise the leg until you feel a gentle stretch behind your right / left knee or thigh. Hold this position for __________ seconds. Repeat __________ times. Complete this stretch __________ times per day.  STRENGTHENING EXERCISES These exercises may help you when beginning to rehabilitate your injury. They may resolve your symptoms with or without further involvement from your physician, physical  therapist, or athletic trainer. While completing these exercises, remember:   Muscles can gain both the endurance and the strength needed for everyday activities through controlled exercises.  Complete these exercises as instructed by your physician, physical therapist, or athletic trainer. Progress the resistance and repetitions only as guided.  You may experience muscle soreness or fatigue, but the pain or discomfort you are trying to eliminate should never worsen during these exercises. If this pain does worsen, stop and make certain you are following the directions exactly. If the pain is still present after adjustments, discontinue the exercise until you can discuss the trouble with your clinician. STRENGTH - Quadriceps, Isometrics  Lie on your back with your right / left leg extended and your opposite knee bent.  Gradually tense the muscles in the front of your right / left thigh. You should see either your knee cap slide up toward your hip or increased dimpling just above the knee. This motion will push the back of the knee down toward the floor/mat/bed on which you are lying.  Hold the muscle as tight as you can without increasing your pain for __________ seconds.  Relax the muscles slowly and completely in between each repetition. Repeat __________ times. Complete this exercise __________ times per day.  STRENGTH - Quadriceps, Short Arcs   Lie on your back. Place a __________ inch towel roll under your knee so that the knee slightly bends.  Raise only your lower leg by tightening the muscles in the front of your thigh. Do not allow your thigh to rise.  Hold this position for __________ seconds. Repeat __________ times. Complete this exercise __________ times per day.  OPTIONAL ANKLE WEIGHTS: Begin with ____________________, but DO NOT exceed ____________________. Increase in 1 pound/0.5 kilogram increments.  STRENGTH - Quadriceps, Straight Leg Raises  Quality counts! Watch for  signs that the quadriceps muscle is working to insure you are strengthening the correct muscles and not "cheating" by substituting with healthier muscles.  Lay on your back with your right / left leg extended and your opposite knee bent.  Tense the muscles in the front of your right / left thigh. You should see either your knee cap slide up or increased dimpling just above the knee. Your thigh may even quiver.  Tighten these muscles even more and raise your leg 4 to 6 inches off the floor. Hold for __________ seconds.  Keeping these muscles tense, lower your leg.  Relax the muscles slowly and completely in between each repetition. Repeat __________ times. Complete this exercise __________ times per day.  STRENGTH - Hamstring, Curls  Lay on your stomach with your legs extended. (If you lay on a bed, your feet may hang over the edge.)  Tighten the muscles in the back of your thigh to bend your right / left knee up to 90 degrees. Keep your hips flat on the bed/floor.  Hold this position for __________ seconds.  Slowly lower your leg back to the starting position. Repeat __________ times. Complete this exercise __________ times per day.  OPTIONAL ANKLE WEIGHTS: Begin with ____________________,  but DO NOT exceed ____________________. Increase in 1 pound/0.5 kilogram increments.  STRENGTH - Quadriceps, Squats  Stand in a door frame so that your feet and knees are in line with the frame.  Use your hands for balance, not support, on the frame.  Slowly lower your weight, bending at the hips and knees. Keep your lower legs upright so that they are parallel with the door frame. Squat only within the range that does not increase your knee pain. Never let your hips drop below your knees.  Slowly return upright, pushing with your legs, not pulling with your hands. Repeat __________ times. Complete this exercise __________ times per day.  STRENGTH - Quadriceps, Wall Slides  Follow guidelines for  form closely. Increased knee pain often results from poorly placed feet or knees.  Lean against a smooth wall or door and walk your feet out 18-24 inches. Place your feet hip-width apart.  Slowly slide down the wall or door until your knees bend __________ degrees.* Keep your knees over your heels, not your toes, and in line with your hips, not falling to either side.  Hold for __________ seconds. Stand up to rest for __________ seconds in between each repetition. Repeat __________ times. Complete this exercise __________ times per day. * Your physician, physical therapist, or athletic trainer will alter this angle based on your symptoms and progress.   This information is not intended to replace advice given to you by your health care provider. Make sure you discuss any questions you have with your health care provider.   Document Released: 03/27/2005 Document Revised: 06/03/2014 Document Reviewed: 08/25/2008 Elsevier Interactive Patient Education [employer].

## 2015-10-12 NOTE — ED Notes (Signed)
Patient is having menstrual cramps associated with abdominal pain and lower back pain, patient also complains of having left knee pain, she says she stands all day while working and thinks that attributes to her knee pain. No acute distress

## 2015-10-21 DIAGNOSIS — H5203 Hypermetropia, bilateral: Secondary | ICD-10-CM | POA: Diagnosis not present

## 2016-01-19 ENCOUNTER — Emergency Department (HOSPITAL_COMMUNITY)
Admission: EM | Admit: 2016-01-19 | Discharge: 2016-01-19 | Disposition: A | Discharge status: Home / Self Care | Payer: BLUE CROSS/BLUE SHIELD | Attending: Dermatology | Admitting: Dermatology

## 2016-01-19 ENCOUNTER — Encounter (HOSPITAL_COMMUNITY): Payer: Self-pay | Admitting: Vascular Surgery

## 2016-01-19 ENCOUNTER — Emergency Department (HOSPITAL_COMMUNITY): Payer: BLUE CROSS/BLUE SHIELD

## 2016-01-19 DIAGNOSIS — F17210 Nicotine dependence, cigarettes, uncomplicated: Secondary | ICD-10-CM | POA: Insufficient documentation

## 2016-01-19 DIAGNOSIS — Z79899 Other long term (current) drug therapy: Secondary | ICD-10-CM | POA: Insufficient documentation

## 2016-01-19 DIAGNOSIS — I10 Essential (primary) hypertension: Secondary | ICD-10-CM | POA: Insufficient documentation

## 2016-01-19 DIAGNOSIS — Z5321 Procedure and treatment not carried out due to patient leaving prior to being seen by health care provider: Secondary | ICD-10-CM | POA: Insufficient documentation

## 2016-01-19 DIAGNOSIS — R05 Cough: Secondary | ICD-10-CM | POA: Insufficient documentation

## 2016-01-19 MED ORDER — ALBUTEROL SULFATE (2.5 MG/3ML) 0.083% IN NEBU
5.0000 mg | INHALATION_SOLUTION | Freq: Once | RESPIRATORY_TRACT | Status: AC
  Administered 2016-01-19: 5 mg via RESPIRATORY_TRACT

## 2016-01-19 MED ORDER — ALBUTEROL SULFATE (2.5 MG/3ML) 0.083% IN NEBU
INHALATION_SOLUTION | RESPIRATORY_TRACT | Status: AC
  Filled 2016-01-19: qty 6

## 2016-01-19 NOTE — ED Notes (Signed)
No response in lobby for reassessment 

## 2016-01-19 NOTE — ED Triage Notes (Signed)
Pt reports to the ED for eval of productive light green cough, SOB, and wheezing x several days. Has been trying OTC Theraflu and a humidifier with minimal relief in symptoms. Pt denies any hx of asthma. Pt A&OX4, resp e/u, and skin warm and dry.

## 2016-01-19 NOTE — ED Notes (Signed)
Pt was called for reassessment without response in lobby

## 2016-01-20 ENCOUNTER — Encounter (HOSPITAL_COMMUNITY): Payer: Self-pay | Admitting: Emergency Medicine

## 2016-01-20 ENCOUNTER — Ambulatory Visit (HOSPITAL_COMMUNITY)
Admission: EM | Admit: 2016-01-20 | Discharge: 2016-01-20 | Disposition: A | Discharge status: Home / Self Care | Payer: Self-pay | Attending: Physician Assistant | Admitting: Physician Assistant

## 2016-01-20 DIAGNOSIS — R062 Wheezing: Secondary | ICD-10-CM

## 2016-01-20 DIAGNOSIS — E119 Type 2 diabetes mellitus without complications: Secondary | ICD-10-CM

## 2016-01-20 DIAGNOSIS — Z720 Tobacco use: Secondary | ICD-10-CM

## 2016-01-20 DIAGNOSIS — N390 Urinary tract infection, site not specified: Secondary | ICD-10-CM

## 2016-01-20 LAB — POCT URINALYSIS DIP (DEVICE)
GLUCOSE, UA: 250 mg/dL — AB
KETONES UR: 15 mg/dL — AB
Nitrite: POSITIVE — AB
SPECIFIC GRAVITY, URINE: 1.015 (ref 1.005–1.030)
Urobilinogen, UA: >=8 mg/dL (ref 0.0–1.0)
pH: 5 (ref 5.0–8.0)

## 2016-01-20 LAB — GLUCOSE, CAPILLARY: Glucose-Capillary: 105 mg/dL — ABNORMAL HIGH (ref 65–99)

## 2016-01-20 MED ORDER — IPRATROPIUM-ALBUTEROL 0.5-2.5 (3) MG/3ML IN SOLN
3.0000 mL | Freq: Once | RESPIRATORY_TRACT | Status: AC
  Administered 2016-01-20: 3 mL via RESPIRATORY_TRACT

## 2016-01-20 MED ORDER — ALBUTEROL SULFATE (5 MG/ML) 0.5% IN NEBU: 2.5000 mg | INHALATION_SOLUTION | Freq: Four times a day (QID) | RESPIRATORY_TRACT | 2 refills | Status: DC | PRN

## 2016-01-20 MED ORDER — NITROFURANTOIN MONOHYD MACRO 100 MG PO CAPS: 100.0000 mg | ORAL_CAPSULE | Freq: Two times a day (BID) | ORAL | 0 refills | Status: DC

## 2016-01-20 MED ORDER — IPRATROPIUM-ALBUTEROL 0.5-2.5 (3) MG/3ML IN SOLN
RESPIRATORY_TRACT | Status: AC
  Filled 2016-01-20: qty 3

## 2016-01-20 NOTE — ED Provider Notes (Signed)
CSN: [id number]     Arrival date & time 01/20/16  1255 History   First MD Initiated Contact with Patient 01/20/16 1456     Chief Complaint  Patient presents with  . Recurrent UTI  . Wheezing   (Consider location/radiation/quality/duration/timing/severity/associated sxs/prior Treatment) 40 yo black female presents with several concerns today. She carries a history of HTN and Type 2 DM but does not currently have a PCP. She also is a tobacco user.  She reports 3 days of dysuria and "irritation". She has been using AZO without much relief. No vaginal discharge is noted. No flank pain.  She also notes 4-5 days of wheezing. She carries no history of Asthma, but is a smoker. No fever or chills. Noted cough with greenish phlegm. She went to the ED last night but after the CXR had to leave and did not get treatment or a report. She usus a nebulizer at home that was given years ago but is out of solution.     Past Medical History:  Diagnosis Date  . Anemia    hx   . Gestational diabetes    Neg 2 hr test after preg  . Headache(784.0)   . Herpes genitalia    2013 - 1st outbreak  . Hypertension    2008  . Pre-eclampsia in third trimester   . Seasonal allergies   . Shortness of breath    w exertion  . Trichomonas   . Twins    Past Surgical History:  Procedure Laterality Date  . CESAREAN SECTION     X 2  . CESAREAN SECTION  04/21/2012   Procedure: CESAREAN SECTION;  Surgeon: Woodroe Mode, MD;  Location: [facility];  Service: Obstetrics;  Laterality: N/A;   Family History  Problem Relation Age of Onset  . Hypertension Mother   . Stroke Maternal Grandmother   . Heart disease Maternal Grandfather    Social History  Substance Use Topics  . Smoking status: Current Every Day Smoker    Packs/day: 0.50    Years: 17.00    Types: Cigarettes    Last attempt to quit: 10/11/2011  . Smokeless tobacco: Never Used  . Alcohol use 0.0 oz/week     Comment: socially but none with pregnancy   OB  History    Gravida Para Term Preterm AB Living   4 3 1 2  0 4   SAB TAB Ectopic Multiple Live Births   0 0 0 2 4     Review of Systems  Constitutional: Negative for chills, fatigue and fever.  HENT: Negative for congestion, rhinorrhea and sinus pressure.   Respiratory: Positive for cough, shortness of breath and wheezing.   Cardiovascular: Negative for chest pain.  Gastrointestinal: Positive for nausea and vomiting. Negative for abdominal pain.  Genitourinary: Positive for dysuria, frequency and urgency. Negative for flank pain and vaginal discharge.  Musculoskeletal: Negative.   Psychiatric/Behavioral: Negative.     Allergies  Penicillins  Home Medications   Prior to Admission medications   Medication Sig Start Date End Date Taking? Authorizing Provider  Phenazopyridine HCl (AZO-GESIC PO) Take by mouth.   Yes Historical Provider, MD  acetaminophen (TYLENOL) 500 MG tablet Take 1,000 mg by mouth every 6 (six) hours as needed. pain    Historical Provider, MD  albuterol (PROVENTIL) (5 MG/ML) 0.5% nebulizer solution Take 0.5 mLs (2.5 mg total) by nebulization every 6 (six) hours as needed for wheezing or shortness of breath. 01/20/16   Bjorn Pippin, PA-C  cetirizine (ZYRTEC) 10 MG tablet Take 10 mg by mouth as needed. allerigies    Historical Provider, MD  Chlorphen-Pseudoephed-APAP [facility] FLU/COLD PO) Take by mouth.    Historical Provider, MD  famotidine (PEPCID) 20 MG tablet Take 1 tablet (20 mg total) by mouth 2 (two) times daily. 03/24/15   Hope Bunnie Pion, NP  hydrOXYzine (ATARAX/VISTARIL) 25 MG tablet Take 1 tablet (25 mg total) by mouth every 6 (six) hours. 03/24/15   Hope Bunnie Pion, NP  ibuprofen (ADVIL,MOTRIN) 800 MG tablet Take 1 tablet (800 mg total) by mouth 3 (three) times daily. 08/09/15   Nona Dell, PA-C  lisinopril (PRINIVIL,ZESTRIL) 2.5 MG tablet Take 2.5 mg by mouth daily.    Historical Provider, MD  loratadine (CLARITIN) 10 MG tablet Take 1 tablet (10 mg  total) by mouth daily. 03/24/15   Hope Bunnie Pion, NP  meloxicam (MOBIC) 15 MG tablet Take 0.5-1 tablets (7.5-15 mg total) by mouth daily. 10/12/15   Waldemar Dickens, MD  nitrofurantoin, macrocrystal-monohydrate, (MACROBID) 100 MG capsule Take 1 capsule (100 mg total) by mouth 2 (two) times daily. 01/20/16   Bjorn Pippin, PA-C   Meds Ordered and Administered this Visit   Medications  ipratropium-albuterol (DUONEB) 0.5-2.5 (3) MG/3ML nebulizer solution 3 mL (3 mLs Nebulization Given 01/20/16 1552)    BP 115/71 (BP Location: Left Arm)   Pulse 83   Temp 98.9 F (37.2 C) (Oral)   Resp 16   LMP 01/08/2016 (Within Days)   SpO2 100%  No data found.   Physical Exam  Constitutional: She is oriented to person, place, and time. She appears well-developed and well-nourished. No distress.  HENT:  Head: Normocephalic and atraumatic.  Mouth/Throat: Oropharynx is clear and moist.  Eyes: Pupils are equal, round, and reactive to light.  Neck: Normal range of motion.  Cardiovascular: Normal rate and regular rhythm.   Pulmonary/Chest: Effort normal.  Expiratory wheeze throughout lower bases  Abdominal: Soft. She exhibits no distension. There is no tenderness. There is no guarding.  Lymphadenopathy:    She has no cervical adenopathy.  Neurological: She is alert and oriented to person, place, and time.  Skin: Skin is warm and dry. She is not diaphoretic.  Psychiatric: Her behavior is normal.  Nursing note and vitals reviewed.   Urgent Care Course   Clinical Course    Procedures (including critical care time)  Labs Review Labs Reviewed  GLUCOSE, CAPILLARY - Abnormal; Notable for the following:       Result Value   Glucose-Capillary 105 (*)    All other components within normal limits  POCT URINALYSIS DIP (DEVICE) - Abnormal; Notable for the following:    Glucose, UA 250 (*)    Bilirubin Urine MODERATE (*)    Ketones, ur 15 (*)    Hgb urine dipstick TRACE (*)    Protein, ur >=300 (*)     Nitrite POSITIVE (*)    Leukocytes, UA LARGE (*)    All other components within normal limits    Imaging Review Dg Chest 2 View  Result Date: 01/19/2016 CLINICAL DATA:  Pt c/o wheezing, productive cough, SOB, chills, lightheadedness and soreness in chest. Hx of acid reflux, HTN, diabetes, PNA, bronchitis, current smoker- 5 cigarettes per day. EXAM: CHEST  2 VIEW COMPARISON:  06/19/2013 FINDINGS: The heart size and mediastinal contours are within normal limits. Both lungs are clear. No pleural effusion or pneumothorax. The visualized skeletal structures are unremarkable. IMPRESSION: No active cardiopulmonary disease. Electronically Signed  By: Lajean Manes M.D.   On: 01/19/2016 19:00     Visual Acuity Review  Right Eye Distance:   Left Eye Distance:   Bilateral Distance:    Right Eye Near:   Left Eye Near:    Bilateral Near:         MDM   1. UTI (lower urinary tract infection)   2. Wheezing   3. Tobacco use   4. Type 2 diabetes mellitus without complication, without long-term current use of insulin ([place])    1. Treat with fluids and Macrobid x 7 days.  2. No prior history though use of a Nebulizer at home. CXR report was performed last night and reviewed today. No signs of infection. Treat with solution for neb (could not afford a MDI). Use Delsym at home for cough. Must establish care with a PCP. Information given.  3. Encouraged and counseled re: smoking cessation.  4. Proteinuria today which could be in error from [place]. CBG ok. Must establish care with PCP as we discussed renal damage from DM. No emergent needs today. Stable to DC home.     Bjorn Pippin, PA-C 01/20/16 1556

## 2016-01-20 NOTE — ED Notes (Signed)
Patient is currently taking an AZO type medicine, results of U/A will be altered

## 2016-01-20 NOTE — ED Triage Notes (Signed)
Patient burning with urination and continued pain after urinary flow has ended.  Reports back pain.  Used otc one day monistat treatment resulting in less itching.  Burning has continued.   Wheezing, sob, light green phlegm with coughing .  Denies fever.

## 2016-01-20 NOTE — Discharge Instructions (Signed)
You have a Urinary tract infection which we will treat with antibiotics. Drink a lot of water. Ok to use Azo as needed for up to 3 full days.  You have sugar and protein in your urine. This could be from the [place], however could be from uncontrolled diabetes. Please establish care with a PCP for further management and care.  You have wheezing on exam. Will treat with with refills for your nebulizer, but really may need maintenence care and f/u. Smoking cessation is encouraged. No signs of infection by CXR.

## 2016-06-19 ENCOUNTER — Ambulatory Visit (HOSPITAL_COMMUNITY)
Admission: EM | Admit: 2016-06-19 | Discharge: 2016-06-19 | Disposition: A | Discharge status: Home / Self Care | Payer: BLUE CROSS/BLUE SHIELD | Attending: Internal Medicine | Admitting: Internal Medicine

## 2016-06-19 ENCOUNTER — Encounter (HOSPITAL_COMMUNITY): Payer: Self-pay | Admitting: Emergency Medicine

## 2016-06-19 DIAGNOSIS — D649 Anemia, unspecified: Secondary | ICD-10-CM | POA: Insufficient documentation

## 2016-06-19 DIAGNOSIS — F17210 Nicotine dependence, cigarettes, uncomplicated: Secondary | ICD-10-CM | POA: Diagnosis not present

## 2016-06-19 DIAGNOSIS — Z880 Allergy status to penicillin: Secondary | ICD-10-CM | POA: Insufficient documentation

## 2016-06-19 DIAGNOSIS — N390 Urinary tract infection, site not specified: Secondary | ICD-10-CM | POA: Diagnosis not present

## 2016-06-19 DIAGNOSIS — I10 Essential (primary) hypertension: Secondary | ICD-10-CM | POA: Insufficient documentation

## 2016-06-19 DIAGNOSIS — N3000 Acute cystitis without hematuria: Secondary | ICD-10-CM | POA: Insufficient documentation

## 2016-06-19 LAB — POCT URINALYSIS DIP (DEVICE)
BILIRUBIN URINE: NEGATIVE
GLUCOSE, UA: 100 mg/dL — AB
Hgb urine dipstick: NEGATIVE
KETONES UR: NEGATIVE mg/dL
Leukocytes, UA: NEGATIVE
Nitrite: POSITIVE — AB
PROTEIN: NEGATIVE mg/dL
SPECIFIC GRAVITY, URINE: 1.01 (ref 1.005–1.030)
Urobilinogen, UA: 1 mg/dL (ref 0.0–1.0)
pH: 6 (ref 5.0–8.0)

## 2016-06-19 MED ORDER — SULFAMETHOXAZOLE-TRIMETHOPRIM 800-160 MG PO TABS: 1.0000 | ORAL_TABLET | Freq: Two times a day (BID) | ORAL | 0 refills | Status: AC

## 2016-06-19 MED ORDER — PHENAZOPYRIDINE HCL 200 MG PO TABS: 200.0000 mg | ORAL_TABLET | Freq: Three times a day (TID) | ORAL | 0 refills | Status: DC | PRN

## 2016-06-19 NOTE — ED Triage Notes (Signed)
Symptoms for 2 days.  Noticed frequency, urgency and painful urination.  History of the same.  Denies fever

## 2016-06-19 NOTE — Discharge Instructions (Signed)
You are being treated tonight for a UTI as you had positive nitrites on your UA. I have sent a prescription for Bactrim DS, take 1 tablet twice a day for 5 days and Pyridium, take 1 tablet three times a day for 3 days. You will be notified of the results of your other tests within 24-72 hours and should any change in therapy be needed, you will be notified and medications sent to your pharmacy.  Should your symptoms fail to resolve or worsen follow up with your primary care provider or return to clinic.

## 2016-06-19 NOTE — ED Provider Notes (Signed)
CSN: [id number]     Arrival date & time 06/19/16  1842 History   None    Chief Complaint  Patient presents with  . Urinary Tract Infection   (Consider location/radiation/quality/duration/timing/severity/associated sxs/prior Treatment) 41 year old female presents with two day history of vaginal itching, burning with urination, and urgency. She denies flank pain, denies fever, chills, or nausea. She denies vaginal discharge but states she treated herself with OTC monistat 1 week ago with some improvement in symptoms.   The history is provided by the patient.  Urinary Tract Infection    Past Medical History:  Diagnosis Date  . Anemia    hx   . Gestational diabetes    Neg 2 hr test after preg  . Headache(784.0)   . Herpes genitalia    2013 - 1st outbreak  . Hypertension    2008  . Pre-eclampsia in third trimester   . Seasonal allergies   . Shortness of breath    w exertion  . Trichomonas   . Twins    Past Surgical History:  Procedure Laterality Date  . CESAREAN SECTION     X 2  . CESAREAN SECTION  04/21/2012   Procedure: CESAREAN SECTION;  Surgeon: Woodroe Mode, MD;  Location: [facility];  Service: Obstetrics;  Laterality: N/A;   Family History  Problem Relation Age of Onset  . Hypertension Mother   . Stroke Maternal Grandmother   . Heart disease Maternal Grandfather    Social History  Substance Use Topics  . Smoking status: Current Every Day Smoker    Packs/day: 0.50    Years: 17.00    Types: Cigarettes    Last attempt to quit: 10/11/2011  . Smokeless tobacco: Never Used  . Alcohol use 0.0 oz/week     Comment: socially but none with pregnancy   OB History    Gravida Para Term Preterm AB Living   4 3 1 2  0 4   SAB TAB Ectopic Multiple Live Births   0 0 0 2 4     Review of Systems  Reason unable to perform ROS: as covered in HPI.  All other systems reviewed and are negative.   Allergies  Penicillins  Home Medications   Prior to Admission medications    Medication Sig Start Date End Date Taking? Authorizing Provider  acetaminophen (TYLENOL) 500 MG tablet Take 1,000 mg by mouth every 6 (six) hours as needed. pain    Historical Provider, MD  albuterol (PROVENTIL) (5 MG/ML) 0.5% nebulizer solution Take 0.5 mLs (2.5 mg total) by nebulization every 6 (six) hours as needed for wheezing or shortness of breath. 01/20/16   Bjorn Pippin, PA-C  cetirizine (ZYRTEC) 10 MG tablet Take 10 mg by mouth as needed. allerigies    Historical Provider, MD  Chlorphen-Pseudoephed-APAP [facility] FLU/COLD PO) Take by mouth.    Historical Provider, MD  famotidine (PEPCID) 20 MG tablet Take 1 tablet (20 mg total) by mouth 2 (two) times daily. Patient not taking: Reported on 06/19/2016 03/24/15   Ashley Murrain, NP  hydrOXYzine (ATARAX/VISTARIL) 25 MG tablet Take 1 tablet (25 mg total) by mouth every 6 (six) hours. Patient not taking: Reported on 06/19/2016 03/24/15   Ashley Murrain, NP  ibuprofen (ADVIL,MOTRIN) 800 MG tablet Take 1 tablet (800 mg total) by mouth 3 (three) times daily. 08/09/15   Nona Dell, PA-C  lisinopril (PRINIVIL,ZESTRIL) 2.5 MG tablet Take 2.5 mg by mouth daily.    Historical Provider, MD  loratadine (  CLARITIN) 10 MG tablet Take 1 tablet (10 mg total) by mouth daily. Patient not taking: Reported on 06/19/2016 03/24/15   Ashley Murrain, NP  meloxicam (MOBIC) 15 MG tablet Take 0.5-1 tablets (7.5-15 mg total) by mouth daily. Patient not taking: Reported on 06/19/2016 10/12/15   Waldemar Dickens, MD  nitrofurantoin, macrocrystal-monohydrate, (MACROBID) 100 MG capsule Take 1 capsule (100 mg total) by mouth 2 (two) times daily. Patient not taking: Reported on 06/19/2016 01/20/16   Bjorn Pippin, PA-C  phenazopyridine (PYRIDIUM) 200 MG tablet Take 1 tablet (200 mg total) by mouth 3 (three) times daily as needed for pain. 06/19/16   Barnet Glasgow, NP  Phenazopyridine HCl (AZO-GESIC PO) Take by mouth.    Historical Provider, MD   sulfamethoxazole-trimethoprim (BACTRIM DS,SEPTRA DS) 800-160 MG tablet Take 1 tablet by mouth 2 (two) times daily. 06/19/16 06/24/16  Barnet Glasgow, NP   Meds Ordered and Administered this Visit  Medications - No data to display  BP 139/86 (BP Location: Left Arm) Comment (BP Location): large cuff  Pulse 77   Temp 98.4 F (36.9 C)   Resp 18   LMP 06/12/2016   SpO2 100%  No data found.   Physical Exam  Constitutional: She is oriented to person, place, and time. She appears well-developed and well-nourished. No distress.  HENT:  Head: Normocephalic and atraumatic.  Cardiovascular: Normal rate.   Pulmonary/Chest: Effort normal and breath sounds normal.  Abdominal: Soft. Bowel sounds are normal. She exhibits no distension. There is no tenderness. There is no guarding and no CVA tenderness.  Genitourinary:  Genitourinary Comments: deferred  Neurological: She is alert and oriented to person, place, and time.  Skin: Skin is warm and dry. Capillary refill takes less than 2 seconds. She is not diaphoretic.  Psychiatric: She has a normal mood and affect.  Nursing note and vitals reviewed.   Urgent Care Course     Procedures (including critical care time)  Labs Review Labs Reviewed  POCT URINALYSIS DIP (DEVICE) - Abnormal; Notable for the following:       Result Value   Glucose, UA 100 (*)    Nitrite POSITIVE (*)    All other components within normal limits  URINE CULTURE  URINE CYTOLOGY ANCILLARY ONLY    Imaging Review No results found.   Visual Acuity Review  Right Eye Distance:   Left Eye Distance:   Bilateral Distance:    Right Eye Near:   Left Eye Near:    Bilateral Near:         MDM   1. Acute cystitis without hematuria   Ua, urine pregnancy, urine culture, and urine cyto for GC/Chlamydia, gardnerella, and candida ordered. Patient to be notified of results.  You are being treated tonight for a UTI as you had positive nitrites on your UA. I have sent a  prescription for Bactrim DS, take 1 tablet twice a day for 5 days and Pyridium, take 1 tablet three times a day for 3 days. You will be notified of the results of your other tests within 24-72 hours and should any change in therapy be needed, you will be notified and medications sent to your pharmacy.  Should your symptoms fail to resolve or worsen follow up with your primary care provider or return to clinic.      Barnet Glasgow, NP 06/19/16 2141

## 2016-06-20 LAB — URINE CYTOLOGY ANCILLARY ONLY
CHLAMYDIA, DNA PROBE: NEGATIVE
NEISSERIA GONORRHEA: NEGATIVE
TRICH (WINDOWPATH): NEGATIVE

## 2016-06-21 LAB — URINE CULTURE

## 2016-06-25 ENCOUNTER — Telehealth (HOSPITAL_COMMUNITY): Payer: Self-pay | Admitting: Internal Medicine

## 2016-06-25 LAB — URINE CYTOLOGY ANCILLARY ONLY: BACTERIAL VAGINITIS: POSITIVE — AB

## 2016-06-25 MED ORDER — METRONIDAZOLE 500 MG PO TABS: 500.0000 mg | ORAL_TABLET | Freq: Two times a day (BID) | ORAL | 0 refills | Status: DC

## 2016-06-25 NOTE — Telephone Encounter (Signed)
Clinical staff, please let patient know that test for gardnerella (bacterial vaginosis) was positive.  Rx for metronidazole sent to pharmacy of record, [facility] on [address].   Urine culture did not clearly demonstrate a UTI.  Other possible causes of urinary discomfort include chafing; irritation from hygiene product; other pelvic infection (yeast, BV) or STD; occasionally dietary causes (caffeine) or decreased estrogen effect.   Recheck for further evaluation if symptoms are not improving.  LM

## 2016-07-16 ENCOUNTER — Ambulatory Visit (INDEPENDENT_AMBULATORY_CARE_PROVIDER_SITE_OTHER): Payer: BLUE CROSS/BLUE SHIELD | Admitting: Family Medicine

## 2016-07-16 VITALS — BP 130/80 | HR 87 | Temp 98.2°F | Resp 17 | Ht 69.0 in | Wt 232.0 lb

## 2016-07-16 DIAGNOSIS — R5383 Other fatigue: Secondary | ICD-10-CM | POA: Diagnosis not present

## 2016-07-16 DIAGNOSIS — I10 Essential (primary) hypertension: Secondary | ICD-10-CM | POA: Diagnosis not present

## 2016-07-16 DIAGNOSIS — D509 Iron deficiency anemia, unspecified: Secondary | ICD-10-CM

## 2016-07-16 DIAGNOSIS — E119 Type 2 diabetes mellitus without complications: Secondary | ICD-10-CM | POA: Diagnosis not present

## 2016-07-16 DIAGNOSIS — R42 Dizziness and giddiness: Secondary | ICD-10-CM | POA: Diagnosis not present

## 2016-07-16 DIAGNOSIS — R9431 Abnormal electrocardiogram [ECG] [EKG]: Secondary | ICD-10-CM | POA: Diagnosis not present

## 2016-07-16 DIAGNOSIS — R350 Frequency of micturition: Secondary | ICD-10-CM

## 2016-07-16 DIAGNOSIS — F439 Reaction to severe stress, unspecified: Secondary | ICD-10-CM | POA: Diagnosis not present

## 2016-07-16 DIAGNOSIS — R0789 Other chest pain: Secondary | ICD-10-CM

## 2016-07-16 DIAGNOSIS — R519 Headache, unspecified: Secondary | ICD-10-CM

## 2016-07-16 DIAGNOSIS — R51 Headache: Secondary | ICD-10-CM

## 2016-07-16 LAB — POCT CBC
GRANULOCYTE PERCENT: 62.6 % (ref 37–80)
HCT, POC: 33.6 % — AB (ref 37.7–47.9)
Hemoglobin: 10.7 g/dL — AB (ref 12.2–16.2)
Lymph, poc: 2.4 (ref 0.6–3.4)
MCH: 19.9 pg — AB (ref 27–31.2)
MCHC: 31.8 g/dL (ref 31.8–35.4)
MCV: 62.6 fL — AB (ref 80–97)
MID (cbc): 0.4 (ref 0–0.9)
MPV: 8.1 fL (ref 0–99.8)
POC Granulocyte: 4.6 (ref 2–6.9)
POC LYMPH PERCENT: 32.3 %L (ref 10–50)
POC MID %: 5.1 %M (ref 0–12)
Platelet Count, POC: 430 10*3/uL — AB (ref 142–424)
RBC: 5.37 M/uL (ref 4.04–5.48)
RDW, POC: 20 %
WBC: 7.3 10*3/uL (ref 4.6–10.2)

## 2016-07-16 LAB — POCT URINALYSIS DIP (MANUAL ENTRY)
Bilirubin, UA: NEGATIVE
GLUCOSE UA: NEGATIVE
Ketones, POC UA: NEGATIVE
Leukocytes, UA: NEGATIVE
NITRITE UA: NEGATIVE
PH UA: 6.5
PROTEIN UA: NEGATIVE
RBC UA: NEGATIVE
Spec Grav, UA: 1.01
UROBILINOGEN UA: 0.2

## 2016-07-16 LAB — POCT URINE PREGNANCY: PREG TEST UR: NEGATIVE

## 2016-07-16 LAB — POC MICROSCOPIC URINALYSIS (UMFC): MUCUS RE: ABSENT

## 2016-07-16 LAB — GLUCOSE, POCT (MANUAL RESULT ENTRY): POC GLUCOSE: 92 mg/dL (ref 70–99)

## 2016-07-16 LAB — POCT GLYCOSYLATED HEMOGLOBIN (HGB A1C): Hemoglobin A1C: 5.6

## 2016-07-16 MED ORDER — LISINOPRIL 5 MG PO TABS: 2.5000 mg | ORAL_TABLET | Freq: Every day | ORAL | 1 refills | Status: DC

## 2016-07-16 NOTE — Patient Instructions (Addendum)
Out of work today and tomorrow, rest, make sure you're drinking sufficient fluids. I will check thyroid tests, electrolytes and iron tests, but can start with an iron supplement over-the-counter once per day for your anemia. I will also refer you to cardiology for evaluation of the chest tightness and borderline abnormal EKG, but if you have any return of chest pain, tightness, or especially if any radiation to your arm, I would recommend being seen in the emergency room right away for further testing. Okay to continue over-the-counter Zantac for now to lessen chance of heartburn.   Restart lisinopril one half pill per day, keep a record of your blood pressures outside of the office and bring them to the next office visit. Return to the clinic or go to the nearest emergency room if any of your symptoms worsen or new symptoms occur.  See information below on stress and stress management, and I provided a few numbers for counselors if you feel that would be helpful. Follow-up with me in the next 1-2 weeks, sooner if worse.  We can discuss tobacco cessation further at that visit, including possible medication. [place] offers smoking cessation clinics. Registration is required. To register call [phone] or register online at [email or web address]  Vivia Budge: [phone] Arvil [place]: [phone]   Nonspecific Chest Pain Chest pain can be caused by many different conditions. There is always a chance that your pain could be related to something serious, such as a heart attack or a blood clot in your lungs. Chest pain can also be caused by conditions that are not life-threatening. If you have chest pain, it is very important to follow up with your health care provider. What are the causes? Causes of this condition include:  Heartburn.  Pneumonia or bronchitis.  Anxiety or stress.  Inflammation around your heart (pericarditis) or lung (pleuritis or pleurisy).  A blood clot in your lung.  A  collapsed lung (pneumothorax). This can develop suddenly on its own (spontaneous pneumothorax) or from trauma to the chest.  Shingles infection (varicella-zoster virus).  Heart attack.  Damage to the bones, muscles, and cartilage that make up your chest wall. This can include:  Bruised bones due to injury.  Strained muscles or cartilage due to frequent or repeated coughing or overwork.  Fracture to one or more ribs.  Sore cartilage due to inflammation (costochondritis). What increases the risk? Risk factors for this condition may include:  Activities that increase your risk for trauma or injury to your chest.  Respiratory infections or conditions that cause frequent coughing.  Medical conditions or overeating that can cause heartburn.  Heart disease or family history of heart disease.  Conditions or health behaviors that increase your risk of developing a blood clot.  Having had chicken pox (varicella zoster). What are the signs or symptoms? Chest pain can feel like:  Burning or tingling on the surface of your chest or deep in your chest.  Crushing, pressure, aching, or squeezing pain.  Dull or sharp pain that is worse when you move, cough, or take a deep breath.  Pain that is also felt in your back, neck, shoulder, or arm, or pain that spreads to any of these areas. Your chest pain may come and go, or it may stay constant. How is this diagnosed? Lab tests or other studies may be needed to find the cause of your pain. Your health care provider may have you take a test called an ECG (electrocardiogram). An ECG records your heartbeat patterns at  the time the test is performed. You may also have other tests, such as:  Transthoracic echocardiogram (TTE). In this test, sound waves are used to create a picture of the heart structures and to look at how blood flows through your heart.  Transesophageal echocardiogram (TEE).This is a more advanced imaging test that takes images  from inside your body. It allows your health care provider to see your heart in finer detail.  Cardiac monitoring. This allows your health care provider to monitor your heart rate and rhythm in real time.  Holter monitor. This is a portable device that records your heartbeat and can help to diagnose abnormal heartbeats. It allows your health care provider to track your heart activity for several days, if needed.  Stress tests. These can be done through exercise or by taking medicine that makes your heart beat more quickly.  Blood tests.  Other imaging tests. How is this treated? Treatment depends on what is causing your chest pain. Treatment may include:  Medicines. These may include:  Acid blockers for heartburn.  Anti-inflammatory medicine.  Pain medicine for inflammatory conditions.  Antibiotic medicine, if an infection is present.  Medicines to dissolve blood clots.  Medicines to treat coronary artery disease (CAD).  Supportive care for conditions that do not require medicines. This may include:  Resting.  Applying heat or cold packs to injured areas.  Limiting activities until pain decreases. Follow these instructions at home: Medicines  If you were prescribed an antibiotic, take it as told by your health care provider. Do not stop taking the antibiotic even if you start to feel better.  Take over-the-counter and prescription medicines only as told by your health care provider. Lifestyle  Do not use any products that contain nicotine or tobacco, such as cigarettes and e-cigarettes. If you need help quitting, ask your health care provider.  Do not drink alcohol.  Make lifestyle changes as directed by your health care provider. These may include:  Getting regular exercise. Ask your health care provider to suggest some activities that are safe for you.  Eating a heart-healthy diet. A registered dietitian can help you to learn healthy eating options.  Maintaining  a healthy weight.  Managing diabetes, if necessary.  Reducing stress, such as with yoga or relaxation techniques. General instructions  Avoid any activities that bring on chest pain.  If heartburn is the cause for your chest pain, raise (elevate) the head of your bed about 6 inches (15 cm) by putting blocks under the legs. Sleeping with more pillows does not effectively relieve heartburn because it only changes the position of your head.  Keep all follow-up visits as told by your health care provider. This is important. This includes any further testing if your chest pain does not go away. Contact a health care provider if:  Your chest pain does not go away.  You have a rash with blisters on your chest.  You have a fever.  You have chills. Get help right away if:  Your chest pain is worse.  You have a cough that gets worse, or you cough up blood.  You have severe pain in your abdomen.  You have severe weakness.  You faint.  You have sudden, unexplained chest discomfort.  You have sudden, unexplained discomfort in your arms, back, neck, or jaw.  You have shortness of breath at any time.  You suddenly start to sweat, or your skin gets clammy.  You feel nauseous or you vomit.  You suddenly  feel light-headed or dizzy.  Your heart begins to beat quickly, or it feels like it is skipping beats. These symptoms may represent a serious problem that is an emergency. Do not wait to see if the symptoms will go away. Get medical help right away. Call your local emergency services (911 in the [place] Do not drive yourself to the hospital.  This information is not intended to replace advice given to you by your health care provider. Make sure you discuss any questions you have with your health care provider. Document Released: 02/20/2005 Document Revised: 02/05/2016 Document Reviewed: 02/05/2016 Elsevier Interactive Patient Education  2017 [employer]  Fatigue Introduction Fatigue is feeling tired all of the time, a lack of energy, or a lack of motivation. Occasional or mild fatigue is often a normal response to activity or life in general. However, long-lasting (chronic) or extreme fatigue may indicate an underlying medical condition. Follow these instructions at home: Watch your fatigue for any changes. The following actions may help to lessen any discomfort you are feeling:  Talk to your health care provider about how much sleep you need each night. Try to get the required amount every night.  Take medicines only as directed by your health care provider.  Eat a healthy and nutritious diet. Ask your health care provider if you need help changing your diet.  Drink enough fluid to keep your urine clear or pale yellow.  Practice ways of relaxing, such as yoga, meditation, massage therapy, or acupuncture.  Exercise regularly.  Change situations that cause you stress. Try to keep your work and personal routine reasonable.  Do not abuse illegal drugs.  Limit alcohol intake to no more than 1 drink per day for nonpregnant women and 2 drinks per day for men. One drink equals 12 ounces of beer, 5 ounces of wine, or 1 ounces of hard liquor.  Take a multivitamin, if directed by your health care provider. Contact a health care provider if:  Your fatigue does not get better.  You have a fever.  You have unintentional weight loss or gain.  You have headaches.  You have difficulty:  Falling asleep.  Sleeping throughout the night.  You feel angry, guilty, anxious, or sad.  You are unable to have a bowel movement (constipation).  You skin is dry.  Your legs or another part of your body is swollen. Get help right away if:  You feel confused.  Your vision is blurry.  You feel faint or pass out.  You have a severe headache.  You have severe abdominal, pelvic, or back pain.  You have chest pain, shortness of breath,  or an irregular or fast heartbeat.  You are unable to urinate or you urinate less than normal.  You develop abnormal bleeding, such as bleeding from the rectum, vagina, nose, lungs, or nipples.  You vomit blood.  You have thoughts about harming yourself or committing suicide.  You are worried that you might harm someone else. This information is not intended to replace advice given to you by your health care provider. Make sure you discuss any questions you have with your health care provider. Document Released: 03/10/2007 Document Revised: 10/19/2015 Document Reviewed: 09/14/2013  2017 Elsevier  Dizziness Dizziness is a common problem. It is a feeling of unsteadiness or light-headedness. You may feel like you are about to faint. Dizziness can lead to injury if you stumble or fall. Anyone can become dizzy, but dizziness is more common in older adults. This  condition can be caused by a number of things, including medicines, dehydration, or illness. Follow these instructions at home: Taking these steps may help with your condition: Eating and drinking  Drink enough fluid to keep your urine clear or pale yellow. This helps to keep you from becoming dehydrated. Try to drink more clear fluids, such as water.  Do not drink alcohol.  Limit your caffeine intake if directed by your health care provider.  Limit your salt intake if directed by your health care provider. Activity  Avoid making quick movements.  Rise slowly from chairs and steady yourself until you feel okay.  In the morning, first sit up on the side of the bed. When you feel okay, stand slowly while you hold onto something until you know that your balance is fine.  Move your legs often if you need to stand in one place for a long time. Tighten and relax your muscles in your legs while you are standing.  Do not drive or operate heavy machinery if you feel dizzy.  Avoid bending down if you feel dizzy. Place items in your  home so that they are easy for you to reach without leaning over. Lifestyle  Do not use any tobacco products, including cigarettes, chewing tobacco, or electronic cigarettes. If you need help quitting, ask your health care provider.  Try to reduce your stress level, such as with yoga or meditation. Talk with your health care provider if you need help. General instructions  Watch your dizziness for any changes.  Take medicines only as directed by your health care provider. Talk with your health care provider if you think that your dizziness is caused by a medicine that you are taking.  Tell a friend or a family member that you are feeling dizzy. If he or she notices any changes in your behavior, have this person call your health care provider.  Keep all follow-up visits as directed by your health care provider. This is important. Contact a health care provider if:  Your dizziness does not go away.  Your dizziness or light-headedness gets worse.  You feel nauseous.  You have reduced hearing.  You have new symptoms.  You are unsteady on your feet or you feel like the room is spinning. Get help right away if:  You vomit or have diarrhea and are unable to eat or drink anything.  You have problems talking, walking, swallowing, or using your arms, hands, or legs.  You feel generally weak.  You are not thinking clearly or you have trouble forming sentences. It may take a friend or family member to notice this.  You have chest pain, abdominal pain, shortness of breath, or sweating.  Your vision changes.  You notice any bleeding.  You have a headache.  You have neck pain or a stiff neck.  You have a fever. This information is not intended to replace advice given to you by your health care provider. Make sure you discuss any questions you have with your health care provider. Document Released: 11/06/2000 Document Revised: 10/19/2015 Document Reviewed: 05/09/2014 Elsevier  Interactive Patient Education  2017 [place] and Stress Management Stress is a normal reaction to life events. It is what you feel when life demands more than you are used to or more than you can handle. Some stress can be useful. For example, the stress reaction can help you catch the last bus of the day, study for a test, or meet a deadline at work.  But stress that occurs too often or for too long can cause problems. It can affect your emotional health and interfere with relationships and normal daily activities. Too much stress can weaken your immune system and increase your risk for physical illness. If you already have a medical problem, stress can make it worse. What are the causes? All sorts of life events may cause stress. An event that causes stress for one person may not be stressful for another person. Major life events commonly cause stress. These may be positive or negative. Examples include losing your job, moving into a new home, getting married, having a baby, or losing a loved one. Less obvious life events may also cause stress, especially if they occur day after day or in combination. Examples include working long hours, driving in traffic, caring for children, being in debt, or being in a difficult relationship. What are the signs or symptoms? Stress may cause emotional symptoms including, the following:  Anxiety. This is feeling worried, afraid, on edge, overwhelmed, or out of control.  Anger. This is feeling irritated or impatient.  Depression. This is feeling sad, down, helpless, or guilty.  Difficulty focusing, remembering, or making decisions. Stress may cause physical symptoms, including the following:  Aches and pains. These may affect your head, neck, back, stomach, or other areas of your body.  Tight muscles or clenched jaw.  Low energy or trouble sleeping. Stress may cause unhealthy behaviors, including the following:  Eating to feel better  (overeating) or skipping meals.  Sleeping too little, too much, or both.  Working too much or putting off tasks (procrastination).  Smoking, drinking alcohol, or using drugs to feel better. How is this diagnosed? Stress is diagnosed through an assessment by your health care provider. Your health care provider will ask questions about your symptoms and any stressful life events.Your health care provider will also ask about your medical history and may order blood tests or other tests. Certain medical conditions and medicine can cause physical symptoms similar to stress. Mental illness can cause emotional symptoms and unhealthy behaviors similar to stress. Your health care provider may refer you to a mental health professional for further evaluation. How is this treated? Stress management is the recommended treatment for stress.The goals of stress management are reducing stressful life events and coping with stress in healthy ways. Techniques for reducing stressful life events include the following:  Stress identification. Self-monitor for stress and identify what causes stress for you. These skills may help you to avoid some stressful events.  Time management. Set your priorities, keep a calendar of events, and learn to say "no." These tools can help you avoid making too many commitments. Techniques for coping with stress include the following:  Rethinking the problem. Try to think realistically about stressful events rather than ignoring them or overreacting. Try to find the positives in a stressful situation rather than focusing on the negatives.  Exercise. Physical exercise can release both physical and emotional tension. The key is to find a form of exercise you enjoy and do it regularly.  Relaxation techniques. These relax the body and mind. Examples include yoga, meditation, tai chi, biofeedback, deep breathing, progressive muscle relaxation, listening to music, being out in nature,  journaling, and other hobbies. Again, the key is to find one or more that you enjoy and can do regularly.  Healthy lifestyle. Eat a balanced diet, get plenty of sleep, and do not smoke. Avoid using alcohol or drugs to relax.  Strong support  network. Spend time with family, friends, or other people you enjoy being around.Express your feelings and talk things over with someone you trust. Counseling or talktherapy with a mental health professional may be helpful if you are having difficulty managing stress on your own. Medicine is typically not recommended for the treatment of stress.Talk to your health care provider if you think you need medicine for symptoms of stress. Follow these instructions at home:  Keep all follow-up visits as directed by your health care provider.  Take all medicines as directed by your health care provider. Contact a health care provider if:  Your symptoms get worse or you start having new symptoms.  You feel overwhelmed by your problems and can no longer manage them on your own. Get help right away if:  You feel like hurting yourself or someone else. This information is not intended to replace advice given to you by your health care provider. Make sure you discuss any questions you have with your health care provider. Document Released: 11/06/2000 Document Revised: 10/19/2015 Document Reviewed: 01/05/2013 Elsevier Interactive Patient Education  2017 [employer].    IF you received an x-ray today, you will receive an invoice from [facility]. Please contact [facility] at [phone] with questions or concerns regarding your invoice.   IF you received labwork today, you will receive an invoice from [facility]. Please contact [facility] at [phone] with questions or concerns regarding your invoice.   Our billing staff will not be able to assist you with questions regarding bills from these companies.  You will be contacted with the lab  results as soon as they are available. The fastest way to get your results is to activate your My Chart account. Instructions are located on the last page of this paperwork. If you have not heard from [place] regarding the results in 2 weeks, please contact this office.

## 2016-07-16 NOTE — Progress Notes (Signed)
By signing my name below, I, Mesha Guinyard, attest that this documentation has been prepared under the direction and in the presence of Merri Ray, MD.  Electronically Signed: Verlee Monte, Medical Scribe. 07/16/16. 2:30 PM.  Subjective:    Patient ID: Robin Fox, female    DOB: 15-Dec-1975, 41 y.o.   MRN: [id number]  HPI Chief Complaint  Patient presents with  . Headache  . Dizziness    HPI Comments: Robin Fox is a 41 y.o. female who presents to the [facility] complaining of dizziness onset 5am last night. Pt went to sleep and woke up today still feeling dizzy and while at work she continued to feel fatigue. She reports associated sxs of frontal and temporal HA, double vision only if she stares at something for too long, and non radiating chest discomfort yesterday evening similar to GERD and without diaphoresis, and SOB. The her chest discomfort last a couple of minutes and felt like GERD, but also a tightness feeling that improved after taking zantac. She has had some chest pain tightness with left arm pain in the past when she's stressed, but denied any radiation of pain or current chest pain. Pt also has back pain, but suspects it's due to working long hours for the past months. Pt has not had her flu shot yet. Denies nausea, emesis, loss of sleep, current chest discomfort, polydipsia, and palpitations.  Situational Stress: She does feel overwhelmed with balancing stress, time at work, and time at home with children/family. Denies depression - just feels overwhelmed. She drinks 1 drink occasionally but no more than 1 drink as alcoholism runs in her family. Denies illicit drug use but does smoke cigarettes but would like to cut back/quit.  Urinary Frequency: Pt has had heavy periods in the past and it's always been 7 days, but since this past year her periods have shortened to 4 days - suspects she's menopausal. Pt's LMP was nl on  06/09/2015 and her husband had a vasectomy so she doesn't use contraception herself. Pt was seen for a UTI 06/19/2016 and she just finished her 7 day rx of septra Saturday, 3 days ago. Her urine culture came back without growth, but they did see some signs of BV and gave her flagyl. She notes there was protein found in her urine during her visit and she reports current urinary frequency. Pt has not been taking lisinopril due to loss of insurance. Pt was dx with DM Dec 2013 and took glyburide until 2015 when her blood sugar got better and she lost her insurance. She was also put on HCTZ in the past, but discontinued due to loss of insurance. Denies polydipsia.  Patient Active Problem List   Diagnosis Date Noted  . Routine postpartum follow-up 05/22/2012  . Mild or unspecified pre-eclampsia, with delivery 04/07/2012  . Abnormal maternal glucose tolerance, antepartum 03/27/2012  . History of IUGR (intrauterine growth retardation) and stillbirth, currently pregnant 02/12/2012  . History of stillbirth 12/12/2011  . Previous cesarean delivery, antepartum condition or complication 25/49/8264  . Obesity (BMI 30-39.9) 11/21/2011  . Hx of herpes genitalis 11/21/2011  . Hypertension in pregnancy, essential, antepartum 11/09/2011  . Chronic Hypertension 11/09/2011  . AMA (advanced maternal age) multigravida 35+ 11/09/2011   Past Medical History:  Diagnosis Date  . Anemia    hx   . Gestational diabetes    Neg 2 hr test after preg  . Headache(784.0)   . Herpes genitalia    2013 -  1st outbreak  . Hypertension    2008  . Pre-eclampsia in third trimester   . Seasonal allergies   . Shortness of breath    w exertion  . Trichomonas   . Twins    Past Surgical History:  Procedure Laterality Date  . CESAREAN SECTION     X 2  . CESAREAN SECTION  04/21/2012   Procedure: CESAREAN SECTION;  Surgeon: Woodroe Mode, MD;  Location: [facility];  Service: Obstetrics;  Laterality: N/A;   Allergies  Allergen  Reactions  . Penicillins Hives, Shortness Of Breath and Swelling    Has patient had a PCN reaction causing immediate rash, facial/tongue/throat swelling, SOB or lightheadedness with hypotension: no Has patient had a PCN reaction causing severe rash involving mucus membranes or skin necrosis:no Has patient had a PCN reaction that required hospitalization no Has patient had a PCN reaction occurring within the last 10 years: {Yes If all of the above answers are "NO", then may proceed with Cephalosporin use.   Prior to Admission medications   Medication Sig Start Date End Date Taking? Authorizing Provider  albuterol (PROVENTIL) (5 MG/ML) 0.5% nebulizer solution Take 0.5 mLs (2.5 mg total) by nebulization every 6 (six) hours as needed for wheezing or shortness of breath. Patient not taking: Reported on 07/16/2016 01/20/16   Bjorn Pippin, PA-C  ibuprofen (ADVIL,MOTRIN) 800 MG tablet Take 1 tablet (800 mg total) by mouth 3 (three) times daily. Patient not taking: Reported on 07/16/2016 08/09/15   Nona Dell, PA-C  lisinopril (PRINIVIL,ZESTRIL) 2.5 MG tablet Take 2.5 mg by mouth daily.    Historical Provider, MD  phenazopyridine (PYRIDIUM) 200 MG tablet Take 1 tablet (200 mg total) by mouth 3 (three) times daily as needed for pain. Patient not taking: Reported on 07/16/2016 06/19/16   Barnet Glasgow, NP   Social History   Social History  . Marital status: Married    Spouse name: N/A  . Number of children: N/A  . Years of education: N/A   Occupational History  . Not on file.   Social History Main Topics  . Smoking status: Current Every Day Smoker    Packs/day: 0.50    Years: 17.00    Types: Cigarettes    Last attempt to quit: 10/11/2011  . Smokeless tobacco: Never Used  . Alcohol use 0.0 oz/week     Comment: socially but none with pregnancy  . Drug use: No  . Sexual activity: Yes    Birth control/ protection: None     Comment: pregnant   Other Topics Concern  . Not on  file   Social History Narrative  . No narrative on file   Review of Systems  Constitutional: Positive for fatigue. Negative for diaphoresis and fever.  Respiratory: Positive for chest tightness. Negative for shortness of breath.   Cardiovascular: Positive for chest pain (discomfort). Negative for palpitations.  Endocrine: Negative for polydipsia.  Genitourinary: Positive for frequency.  Neurological: Positive for dizziness and headaches. Negative for syncope and light-headedness.  Psychiatric/Behavioral: Negative for dysphoric mood.   Objective:  Physical Exam  Constitutional: She is oriented to person, place, and time. She appears well-developed and well-nourished.  HENT:  Head: Normocephalic and atraumatic.  Nose: Right sinus exhibits no maxillary sinus tenderness and no frontal sinus tenderness. Left sinus exhibits no maxillary sinus tenderness and no frontal sinus tenderness.  Eyes: Conjunctivae and EOM are normal. Pupils are equal, round, and reactive to light. Right eye exhibits no nystagmus. Left eye exhibits  no nystagmus.  Neck: Carotid bruit is not present.  Cardiovascular: Normal rate, regular rhythm, normal heart sounds and intact distal pulses.  Exam reveals no friction rub.   No murmur heard. Pulmonary/Chest: Effort normal and breath sounds normal. No respiratory distress. She has no wheezes. She has no rales.  Abdominal: Soft. She exhibits no pulsatile midline mass. There is no tenderness.  Neurological: She is alert and oriented to person, place, and time. She displays a negative Romberg sign.  Negative pronator drift Nl finger to nose test Nl heel to toe test  Skin: Skin is warm and dry.  Psychiatric: She has a normal mood and affect. Her behavior is normal.  Vitals reviewed.  Vitals:   07/16/16 1403  BP: 130/80  Pulse: 87  Resp: 17  Temp: 98.2 F (36.8 C)  TempSrc: Oral  SpO2: 97%  Weight: 232 lb (105.2 kg)  Height: _0  (1.753 m)  Body mass index is  34.26 kg/m.   Orthostatic VS for the past 24 hrs (Last 3 readings):  BP- Lying Pulse- Lying BP- Sitting Pulse- Sitting BP- Standing at 0 minutes Pulse- Standing at 0 minutes  07/16/16 1543 (!) 143/92 76 (!) 148/99 73 (!) 159/100 76   [EKG Reading]: Sinus rhythm. Rate of 73. Non specific T-wave inversion V1,V2 without apparent significant change from previous EKG Aug 2017.  Results for orders placed or performed in visit on 07/16/16  POCT urinalysis dipstick  Result Value Ref Range   Color, UA yellow yellow   Clarity, UA clear clear   Glucose, UA negative negative   Bilirubin, UA negative negative   Ketones, POC UA negative negative   Spec Grav, UA 1.010    Blood, UA negative negative   pH, UA 6.5    Protein Ur, POC negative negative   Urobilinogen, UA 0.2    Nitrite, UA Negative Negative   Leukocytes, UA Negative Negative  POCT urine pregnancy  Result Value Ref Range   Preg Test, Ur Negative Negative  POCT Microscopic Urinalysis ([facility])  Result Value Ref Range   WBC,UR,HPF,POC None None WBC/hpf   RBC,UR,HPF,POC None None RBC/hpf   Bacteria None None, Too numerous to count   Mucus Absent Absent   Epithelial Cells, UR Per Microscopy Few (A) None, Too numerous to count cells/hpf  POCT CBC  Result Value Ref Range   WBC 7.3 4.6 - 10.2 K/uL   Lymph, poc 2.4 0.6 - 3.4   POC LYMPH PERCENT 32.3 10 - 50 %L   MID (cbc) 0.4 0 - 0.9   POC MID % 5.1 0 - 12 %M   POC Granulocyte 4.6 2 - 6.9   Granulocyte percent 62.6 37 - 80 %G   RBC 5.37 4.04 - 5.48 M/uL   Hemoglobin 10.7 (A) 12.2 - 16.2 g/dL   HCT, POC 33.6 (A) 37.7 - 47.9 %   MCV 62.6 (A) 80 - 97 fL   MCH, POC 19.9 (A) 27 - 31.2 pg   MCHC 31.8 31.8 - 35.4 g/dL   RDW, POC 20.0 %   Platelet Count, POC 430 (A) 142 - 424 K/uL   MPV 8.1 0 - 99.8 fL  POCT glucose (manual entry)  Result Value Ref Range   POC Glucose 92 70 - 99 mg/dl  POCT glycosylated hemoglobin (Hb A1C)  Result Value Ref Range   Hemoglobin A1C 5.6      Assessment & Plan:  Over 45 mins of face-to-face care and coordination of care.  Robin Fox  is a 42 y.o. female Situational stress Other fatigue - Plan: POCT urine pregnancy, Basic metabolic panel, TSH Frontal headache Dizziness - Plan: POCT urine pregnancy, POCT CBC, EKG 28-ZMOQ, Basic metabolic panel, TSH, Orthostatic vital signs, Ambulatory referral to Cardiology  - Possible situational stress/situational anxiety component, but also mild anemia. Reassuring orthostatic vital signs, reassuring exam.  -Relative rest for a few days, hydration, check TSH, BMP.  -RTC precautions if fatigue/dizziness persists or returns. If any new visual symptoms, recommended immediate evaluation here or emergency room.  Type 2 diabetes mellitus without complication, without long-term current use of insulin (HCC) - Plan: POCT glucose (manual entry), POCT glycosylated hemoglobin (Hb A1C)  - Reassuring CBC, and below range of diabetes or prediabetes at this point.  Urinary frequency - Plan: POCT urinalysis dipstick, POCT urine pregnancy, POCT Microscopic Urinalysis ([facility]), POCT glucose (manual entry), POCT glycosylated hemoglobin (Hb A1C)  - Reassuring urinalysis and glucose. If symptoms persist, consider urology evaluation. Monitor caffeine intake as that may be cause.  Microcytic anemia - Plan: Fe+TIBC+Fer, CANCELED: IBC Panel  - Possible iron deficiency anemia, check iron studies.  Essential hypertension - Plan: Ambulatory referral to Cardiology, lisinopril (PRINIVIL,ZESTRIL) 5 MG tablet Nonspecific abnormal electrocardiogram (ECG) (EKG) - Plan: Ambulatory referral to Cardiology Chest tightness  - Restart lisinopril at 2.5 mg daily, check labs as above.  -With episodic chest symptoms, and borderline abnormal EKG, refer to cardiology for further evaluation. ER/RTC precautions discussed for chest pains if recur/worsens.     Meds ordered this encounter  Medications  . lisinopril  (PRINIVIL,ZESTRIL) 5 MG tablet    Sig: Take 0.5 tablets (2.5 mg total) by mouth daily.    Dispense:  30 tablet    Refill:  1   Patient Instructions    Out of work today and tomorrow, rest, make sure you're drinking sufficient fluids. I will check thyroid tests, electrolytes and iron tests, but can start with an iron supplement over-the-counter once per day for your anemia. I will also refer you to cardiology for evaluation of the chest tightness and borderline abnormal EKG, but if you have any return of chest pain, tightness, or especially if any radiation to your arm, I would recommend being seen in the emergency room right away for further testing. Okay to continue over-the-counter Zantac for now to lessen chance of heartburn.   Restart lisinopril one half pill per day, keep a record of your blood pressures outside of the office and bring them to the next office visit. Return to the clinic or go to the nearest emergency room if any of your symptoms worsen or new symptoms occur.  See information below on stress and stress management, and I provided a few numbers for counselors if you feel that would be helpful. Follow-up with me in the next 1-2 weeks, sooner if worse.  We can discuss tobacco cessation further at that visit, including possible medication. [facility] offers smoking cessation clinics. Registration is required. To register call [phone] or register online at [email or web address]  Vivia Budge: [phone] Arvil Chaco: [phone]   Nonspecific Chest Pain Chest pain can be caused by many different conditions. There is always a chance that your pain could be related to something serious, such as a heart attack or a blood clot in your lungs. Chest pain can also be caused by conditions that are not life-threatening. If you have chest pain, it is very important to follow up with your health care provider. What are the causes? Causes of this condition include:  Heartburn.  Pneumonia  or bronchitis.  Anxiety or stress.  Inflammation around your heart (pericarditis) or lung (pleuritis or pleurisy).  A blood clot in your lung.  A collapsed lung (pneumothorax). This can develop suddenly on its own (spontaneous pneumothorax) or from trauma to the chest.  Shingles infection (varicella-zoster virus).  Heart attack.  Damage to the bones, muscles, and cartilage that make up your chest wall. This can include:  Bruised bones due to injury.  Strained muscles or cartilage due to frequent or repeated coughing or overwork.  Fracture to one or more ribs.  Sore cartilage due to inflammation (costochondritis). What increases the risk? Risk factors for this condition may include:  Activities that increase your risk for trauma or injury to your chest.  Respiratory infections or conditions that cause frequent coughing.  Medical conditions or overeating that can cause heartburn.  Heart disease or family history of heart disease.  Conditions or health behaviors that increase your risk of developing a blood clot.  Having had chicken pox (varicella zoster). What are the signs or symptoms? Chest pain can feel like:  Burning or tingling on the surface of your chest or deep in your chest.  Crushing, pressure, aching, or squeezing pain.  Dull or sharp pain that is worse when you move, cough, or take a deep breath.  Pain that is also felt in your back, neck, shoulder, or arm, or pain that spreads to any of these areas. Your chest pain may come and go, or it may stay constant. How is this diagnosed? Lab tests or other studies may be needed to find the cause of your pain. Your health care provider may have you take a test called an ECG (electrocardiogram). An ECG records your heartbeat patterns at the time the test is performed. You may also have other tests, such as:  Transthoracic echocardiogram (TTE). In this test, sound waves are used to create a picture of the heart  structures and to look at how blood flows through your heart.  Transesophageal echocardiogram (TEE).This is a more advanced imaging test that takes images from inside your body. It allows your health care provider to see your heart in finer detail.  Cardiac monitoring. This allows your health care provider to monitor your heart rate and rhythm in real time.  Holter monitor. This is a portable device that records your heartbeat and can help to diagnose abnormal heartbeats. It allows your health care provider to track your heart activity for several days, if needed.  Stress tests. These can be done through exercise or by taking medicine that makes your heart beat more quickly.  Blood tests.  Other imaging tests. How is this treated? Treatment depends on what is causing your chest pain. Treatment may include:  Medicines. These may include:  Acid blockers for heartburn.  Anti-inflammatory medicine.  Pain medicine for inflammatory conditions.  Antibiotic medicine, if an infection is present.  Medicines to dissolve blood clots.  Medicines to treat coronary artery disease (CAD).  Supportive care for conditions that do not require medicines. This may include:  Resting.  Applying heat or cold packs to injured areas.  Limiting activities until pain decreases. Follow these instructions at home: Medicines  If you were prescribed an antibiotic, take it as told by your health care provider. Do not stop taking the antibiotic even if you start to feel better.  Take over-the-counter and prescription medicines only as told by your health care provider. Lifestyle  Do not use any products  that contain nicotine or tobacco, such as cigarettes and e-cigarettes. If you need help quitting, ask your health care provider.  Do not drink alcohol.  Make lifestyle changes as directed by your health care provider. These may include:  Getting regular exercise. Ask your health care provider to  suggest some activities that are safe for you.  Eating a heart-healthy diet. A registered dietitian can help you to learn healthy eating options.  Maintaining a healthy weight.  Managing diabetes, if necessary.  Reducing stress, such as with yoga or relaxation techniques. General instructions  Avoid any activities that bring on chest pain.  If heartburn is the cause for your chest pain, raise (elevate) the head of your bed about 6 inches (15 cm) by putting blocks under the legs. Sleeping with more pillows does not effectively relieve heartburn because it only changes the position of your head.  Keep all follow-up visits as told by your health care provider. This is important. This includes any further testing if your chest pain does not go away. Contact a health care provider if:  Your chest pain does not go away.  You have a rash with blisters on your chest.  You have a fever.  You have chills. Get help right away if:  Your chest pain is worse.  You have a cough that gets worse, or you cough up blood.  You have severe pain in your abdomen.  You have severe weakness.  You faint.  You have sudden, unexplained chest discomfort.  You have sudden, unexplained discomfort in your arms, back, neck, or jaw.  You have shortness of breath at any time.  You suddenly start to sweat, or your skin gets clammy.  You feel nauseous or you vomit.  You suddenly feel light-headed or dizzy.  Your heart begins to beat quickly, or it feels like it is skipping beats. These symptoms may represent a serious problem that is an emergency. Do not wait to see if the symptoms will go away. Get medical help right away. Call your local emergency services (911 in the [place] Do not drive yourself to the hospital.  This information is not intended to replace advice given to you by your health care provider. Make sure you discuss any questions you have with your health care provider. Document  Released: 02/20/2005 Document Revised: 02/05/2016 Document Reviewed: 02/05/2016 Elsevier Interactive Patient Education  2017 [employer]  Fatigue Introduction Fatigue is feeling tired all of the time, a lack of energy, or a lack of motivation. Occasional or mild fatigue is often a normal response to activity or life in general. However, long-lasting (chronic) or extreme fatigue may indicate an underlying medical condition. Follow these instructions at home: Watch your fatigue for any changes. The following actions may help to lessen any discomfort you are feeling:  Talk to your health care provider about how much sleep you need each night. Try to get the required amount every night.  Take medicines only as directed by your health care provider.  Eat a healthy and nutritious diet. Ask your health care provider if you need help changing your diet.  Drink enough fluid to keep your urine clear or pale yellow.  Practice ways of relaxing, such as yoga, meditation, massage therapy, or acupuncture.  Exercise regularly.  Change situations that cause you stress. Try to keep your work and personal routine reasonable.  Do not abuse illegal drugs.  Limit alcohol intake to no more than 1 drink per day for nonpregnant women  and 2 drinks per day for men. One drink equals 12 ounces of beer, 5 ounces of wine, or 1 ounces of hard liquor.  Take a multivitamin, if directed by your health care provider. Contact a health care provider if:  Your fatigue does not get better.  You have a fever.  You have unintentional weight loss or gain.  You have headaches.  You have difficulty:  Falling asleep.  Sleeping throughout the night.  You feel angry, guilty, anxious, or sad.  You are unable to have a bowel movement (constipation).  You skin is dry.  Your legs or another part of your body is swollen. Get help right away if:  You feel confused.  Your vision is blurry.  You feel faint or  pass out.  You have a severe headache.  You have severe abdominal, pelvic, or back pain.  You have chest pain, shortness of breath, or an irregular or fast heartbeat.  You are unable to urinate or you urinate less than normal.  You develop abnormal bleeding, such as bleeding from the rectum, vagina, nose, lungs, or nipples.  You vomit blood.  You have thoughts about harming yourself or committing suicide.  You are worried that you might harm someone else. This information is not intended to replace advice given to you by your health care provider. Make sure you discuss any questions you have with your health care provider. Document Released: 03/10/2007 Document Revised: 10/19/2015 Document Reviewed: 09/14/2013  2017 Elsevier  Dizziness Dizziness is a common problem. It is a feeling of unsteadiness or light-headedness. You may feel like you are about to faint. Dizziness can lead to injury if you stumble or fall. Anyone can become dizzy, but dizziness is more common in older adults. This condition can be caused by a number of things, including medicines, dehydration, or illness. Follow these instructions at home: Taking these steps may help with your condition: Eating and drinking  Drink enough fluid to keep your urine clear or pale yellow. This helps to keep you from becoming dehydrated. Try to drink more clear fluids, such as water.  Do not drink alcohol.  Limit your caffeine intake if directed by your health care provider.  Limit your salt intake if directed by your health care provider. Activity  Avoid making quick movements.  Rise slowly from chairs and steady yourself until you feel okay.  In the morning, first sit up on the side of the bed. When you feel okay, stand slowly while you hold onto something until you know that your balance is fine.  Move your legs often if you need to stand in one place for a long time. Tighten and relax your muscles in your legs while you  are standing.  Do not drive or operate heavy machinery if you feel dizzy.  Avoid bending down if you feel dizzy. Place items in your home so that they are easy for you to reach without leaning over. Lifestyle  Do not use any tobacco products, including cigarettes, chewing tobacco, or electronic cigarettes. If you need help quitting, ask your health care provider.  Try to reduce your stress level, such as with yoga or meditation. Talk with your health care provider if you need help. General instructions  Watch your dizziness for any changes.  Take medicines only as directed by your health care provider. Talk with your health care provider if you think that your dizziness is caused by a medicine that you are taking.  Tell a friend  or a family member that you are feeling dizzy. If he or she notices any changes in your behavior, have this person call your health care provider.  Keep all follow-up visits as directed by your health care provider. This is important. Contact a health care provider if:  Your dizziness does not go away.  Your dizziness or light-headedness gets worse.  You feel nauseous.  You have reduced hearing.  You have new symptoms.  You are unsteady on your feet or you feel like the room is spinning. Get help right away if:  You vomit or have diarrhea and are unable to eat or drink anything.  You have problems talking, walking, swallowing, or using your arms, hands, or legs.  You feel generally weak.  You are not thinking clearly or you have trouble forming sentences. It may take a friend or family member to notice this.  You have chest pain, abdominal pain, shortness of breath, or sweating.  Your vision changes.  You notice any bleeding.  You have a headache.  You have neck pain or a stiff neck.  You have a fever. This information is not intended to replace advice given to you by your health care provider. Make sure you discuss any questions you have  with your health care provider. Document Released: 11/06/2000 Document Revised: 10/19/2015 Document Reviewed: 05/09/2014 Elsevier Interactive Patient Education  2017 [place] and Stress Management Stress is a normal reaction to life events. It is what you feel when life demands more than you are used to or more than you can handle. Some stress can be useful. For example, the stress reaction can help you catch the last bus of the day, study for a test, or meet a deadline at work. But stress that occurs too often or for too long can cause problems. It can affect your emotional health and interfere with relationships and normal daily activities. Too much stress can weaken your immune system and increase your risk for physical illness. If you already have a medical problem, stress can make it worse. What are the causes? All sorts of life events may cause stress. An event that causes stress for one person may not be stressful for another person. Major life events commonly cause stress. These may be positive or negative. Examples include losing your job, moving into a new home, getting married, having a baby, or losing a loved one. Less obvious life events may also cause stress, especially if they occur day after day or in combination. Examples include working long hours, driving in traffic, caring for children, being in debt, or being in a difficult relationship. What are the signs or symptoms? Stress may cause emotional symptoms including, the following:  Anxiety. This is feeling worried, afraid, on edge, overwhelmed, or out of control.  Anger. This is feeling irritated or impatient.  Depression. This is feeling sad, down, helpless, or guilty.  Difficulty focusing, remembering, or making decisions. Stress may cause physical symptoms, including the following:  Aches and pains. These may affect your head, neck, back, stomach, or other areas of your body.  Tight muscles or clenched  jaw.  Low energy or trouble sleeping. Stress may cause unhealthy behaviors, including the following:  Eating to feel better (overeating) or skipping meals.  Sleeping too little, too much, or both.  Working too much or putting off tasks (procrastination).  Smoking, drinking alcohol, or using drugs to feel better. How is this diagnosed? Stress is diagnosed through an assessment by  your health care provider. Your health care provider will ask questions about your symptoms and any stressful life events.Your health care provider will also ask about your medical history and may order blood tests or other tests. Certain medical conditions and medicine can cause physical symptoms similar to stress. Mental illness can cause emotional symptoms and unhealthy behaviors similar to stress. Your health care provider may refer you to a mental health professional for further evaluation. How is this treated? Stress management is the recommended treatment for stress.The goals of stress management are reducing stressful life events and coping with stress in healthy ways. Techniques for reducing stressful life events include the following:  Stress identification. Self-monitor for stress and identify what causes stress for you. These skills may help you to avoid some stressful events.  Time management. Set your priorities, keep a calendar of events, and learn to say "no." These tools can help you avoid making too many commitments. Techniques for coping with stress include the following:  Rethinking the problem. Try to think realistically about stressful events rather than ignoring them or overreacting. Try to find the positives in a stressful situation rather than focusing on the negatives.  Exercise. Physical exercise can release both physical and emotional tension. The key is to find a form of exercise you enjoy and do it regularly.  Relaxation techniques. These relax the body and mind. Examples include  yoga, meditation, tai chi, biofeedback, deep breathing, progressive muscle relaxation, listening to music, being out in nature, journaling, and other hobbies. Again, the key is to find one or more that you enjoy and can do regularly.  Healthy lifestyle. Eat a balanced diet, get plenty of sleep, and do not smoke. Avoid using alcohol or drugs to relax.  Strong support network. Spend time with family, friends, or other people you enjoy being around.Express your feelings and talk things over with someone you trust. Counseling or talktherapy with a mental health professional may be helpful if you are having difficulty managing stress on your own. Medicine is typically not recommended for the treatment of stress.Talk to your health care provider if you think you need medicine for symptoms of stress. Follow these instructions at home:  Keep all follow-up visits as directed by your health care provider.  Take all medicines as directed by your health care provider. Contact a health care provider if:  Your symptoms get worse or you start having new symptoms.  You feel overwhelmed by your problems and can no longer manage them on your own. Get help right away if:  You feel like hurting yourself or someone else. This information is not intended to replace advice given to you by your health care provider. Make sure you discuss any questions you have with your health care provider. Document Released: 11/06/2000 Document Revised: 10/19/2015 Document Reviewed: 01/05/2013 Elsevier Interactive Patient Education  2017 [employer].    IF you received an x-ray today, you will receive an invoice from [facility]. Please contact [facility] at [phone] with questions or concerns regarding your invoice.   IF you received labwork today, you will receive an invoice from [facility]. Please contact [facility] at [phone] with questions or concerns regarding your invoice.   Our billing  staff will not be able to assist you with questions regarding bills from these companies.  You will be contacted with the lab results as soon as they are available. The fastest way to get your results is to activate your My Chart account. Instructions are located  on the last page of this paperwork. If you have not heard from [place] regarding the results in 2 weeks, please contact this office.       I personally performed the services described in this documentation, which was scribed in my presence. The recorded information has been reviewed and considered for accuracy and completeness, addended by me as needed, and agree with information above.  Signed,   Merri Ray, MD [facility].  07/19/16 11:44 AM

## 2016-07-16 NOTE — Progress Notes (Signed)
[facility]J

## 2016-07-17 LAB — FE+TIBC+FER
Ferritin: 8 ng/mL — ABNORMAL LOW (ref 15–150)
Iron Saturation: 10 — ABNORMAL LOW (ref 15–55)
Iron: 38 ug/dL (ref 27–159)
Total Iron Binding Capacity: 379 (ref 250–450)
UIBC: 341 ug/dL (ref 131–425)

## 2016-07-17 LAB — BASIC METABOLIC PANEL
BUN/Creatinine Ratio: 10 (ref 9–23)
BUN: 8 mg/dL (ref 6–20)
CALCIUM: 9.3 mg/dL (ref 8.7–10.2)
CO2: 25 mmol/L (ref 18–29)
Chloride: 100 mmol/L (ref 96–106)
Creatinine, Ser: 0.8 mg/dL (ref 0.57–1.00)
GFR calc non Af Amer: 93 (ref >59)
GFR, EST AFRICAN AMERICAN: 107 (ref >59)
GLUCOSE: 96 mg/dL (ref 65–99)
POTASSIUM: 4.7 mmol/L (ref 3.5–5.2)
Sodium: 140 mmol/L (ref 134–144)

## 2016-07-17 LAB — TSH: TSH: 0.904 u[IU]/mL (ref 0.450–4.500)

## 2016-07-23 ENCOUNTER — Ambulatory Visit (INDEPENDENT_AMBULATORY_CARE_PROVIDER_SITE_OTHER): Payer: BLUE CROSS/BLUE SHIELD | Admitting: Family Medicine

## 2016-07-23 VITALS — BP 142/94 | HR 81 | Temp 98.3°F | Resp 16 | Ht 69.0 in | Wt 232.0 lb

## 2016-07-23 DIAGNOSIS — R5383 Other fatigue: Secondary | ICD-10-CM

## 2016-07-23 DIAGNOSIS — I10 Essential (primary) hypertension: Secondary | ICD-10-CM

## 2016-07-23 DIAGNOSIS — F439 Reaction to severe stress, unspecified: Secondary | ICD-10-CM

## 2016-07-23 DIAGNOSIS — N920 Excessive and frequent menstruation with regular cycle: Secondary | ICD-10-CM | POA: Diagnosis not present

## 2016-07-23 DIAGNOSIS — D509 Iron deficiency anemia, unspecified: Secondary | ICD-10-CM

## 2016-07-23 MED ORDER — HYDROCHLOROTHIAZIDE 12.5 MG PO CAPS: 12.5000 mg | ORAL_CAPSULE | Freq: Every day | ORAL | 1 refills | Status: DC

## 2016-07-23 NOTE — Progress Notes (Signed)
Subjective:  By signing my name below, I, Robin Fox, attest that this documentation has been prepared under the direction and in the presence of [name], MD Electronically Signed: Ladene Artist, ED Scribe 07/23/2016 at 6:38 PM.   Patient ID: Robin Fox, female    DOB: [birth year], 41 y.o.   MRN: [id number]  Chief Complaint  Patient presents with  . Follow-up    FATIGUE, go over lab results   HPI Robin Fox is a 41 y.o. female who presents to Primary Care at [facility] for a follow-up of fatigue. Last office visit from 1 week ago. BP was borderline elevated at that time. She had been having multiple symptoms of HA, dizziness and fatigue but suspected component of situational stress. Note given for a few days of rest. Advised hydration. Normal BMP, CBC with mild anemia of 10.7, normal A1C and glucose, normal TSH. She was referred to cardiology for dizziness and chest pain with episodic left arm radiation and borderline abnormal EKG from last visit. Has an appointment March 2.   HTN Restarted on lisinopril 5 mg 0.5 pill per day initially. Triage BP: 142/94. Pt states that she misread the instructions and had been taking a full 5 mg tablet daily. She has only checked her BP once outside of the office with a reading of 130/86. She has been on Lisinopril and HCTZ in the past for ~7 years due to her h/o DM. Pt reports some HAs but states they have improved overall. She denies chest pain, light-headedness, dizziness since her last visit. Pt states that she had a stress test ~7 years ago.   Anemia  Pt started a Women's Vitamin with iron in it following the last visit for anemia. She reports having heavy menses that lasts ~4 days with clotting. Pt does not currently have a OBGYN. No h/o fibroids.   Stress Pt states that stress at home and work is the same. 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 Pt does not want to  start a medication for anxiety or meet with a therapist or counselor at this moment to discuss ways to cope with stress at this time. Pt plans to apply for a membership at [name]. She plans to exercise in the mornings prior to work.   Patient Active Problem List   Diagnosis Date Noted  . Routine postpartum follow-up 05/22/2012  . Mild or unspecified pre-eclampsia, with delivery 04/07/2012  . Abnormal maternal glucose tolerance, antepartum 03/27/2012  . History of IUGR (intrauterine growth retardation) and stillbirth, currently pregnant 02/12/2012  . History of stillbirth 12/12/2011  . Previous cesarean delivery, antepartum condition or complication 83/15/1761  . Obesity (BMI 30-39.9) 11/21/2011  . Hx of herpes genitalis 11/21/2011  . Hypertension in pregnancy, essential, antepartum 11/09/2011  . Chronic Hypertension 11/09/2011  . AMA (advanced maternal age) multigravida 35+ 11/09/2011   Past Medical History:  Diagnosis Date  . Anemia    hx   . Gestational diabetes    Neg 2 hr test after preg  . Headache(784.0)   . Herpes genitalia    2013 - 1st outbreak  . Hypertension    2008  . Pre-eclampsia in third trimester   . Seasonal allergies   . Shortness of breath    w exertion  . Trichomonas   . Twins    Past Surgical History:  Procedure Laterality Date  . CESAREAN SECTION     X 2  . CESAREAN  SECTION  04/21/2012   Procedure: CESAREAN SECTION;  Surgeon: Woodroe Mode, MD;  Location: [facility];  Service: Obstetrics;  Laterality: N/A;   Allergies  Allergen Reactions  . Penicillins Hives, Shortness Of Breath and Swelling    Has patient had a PCN reaction causing immediate rash, facial/tongue/throat swelling, SOB or lightheadedness with hypotension: no Has patient had a PCN reaction causing severe rash involving mucus membranes or skin necrosis:no Has patient had a PCN reaction that required hospitalization no Has patient had a PCN reaction occurring within the last  10 years: {Yes If all of the above answers are "NO", then may proceed with Cephalosporin use.   Prior to Admission medications   Medication Sig Start Date End Date Taking? Authorizing Provider  lisinopril (PRINIVIL,ZESTRIL) 5 MG tablet Take 0.5 tablets (2.5 mg total) by mouth daily. 07/16/16  Yes [name], MD  ibuprofen (ADVIL,MOTRIN) 800 MG tablet Take 1 tablet (800 mg total) by mouth 3 (three) times daily. Patient not taking: Reported on 07/16/2016 08/09/15   Nona Dell, PA-C   Social History   Social History  . Marital status: Married    Spouse name: N/A  . Number of children: N/A  . Years of education: N/A   Occupational History  . Not on file.   Social History Main Topics  . Smoking status: Current Every Day Smoker    Packs/day: 0.50    Years: 17.00    Types: Cigarettes    Last attempt to quit: 10/11/2011  . Smokeless tobacco: Never Used  . Alcohol use 0.0 oz/week     Comment: socially but none with pregnancy  . Drug use: No  . Sexual activity: Yes    Birth control/ protection: None     Comment: pregnant   Other Topics Concern  . Not on file   Social History Narrative  . No narrative on file   Review of Systems  Constitutional: Negative for fatigue and unexpected weight change.  Respiratory: Negative for chest tightness and shortness of breath.   Cardiovascular: Negative for chest pain, palpitations and leg swelling.  Gastrointestinal: Negative for abdominal pain and blood in stool.  Neurological: Positive for headaches. Negative for dizziness, syncope and light-headedness.      Objective:   Physical Exam  Constitutional: She is oriented to person, place, and time. She appears well-developed and well-nourished.  HENT:  Head: Normocephalic and atraumatic.  Eyes: Conjunctivae and EOM are normal. Pupils are equal, round, and reactive to light.  Neck: Carotid bruit is not present.  Cardiovascular: Normal rate, regular rhythm, normal heart  sounds and intact distal pulses.   Pulmonary/Chest: Effort normal and breath sounds normal.  Abdominal: Soft. She exhibits no pulsatile midline mass. There is no tenderness.  Neurological: She is alert and oriented to person, place, and time.  Skin: Skin is warm and dry.  Psychiatric: She has a normal mood and affect. Her behavior is normal.  Vitals reviewed.  Vitals:   07/23/16 1746  BP: (!) 142/94  Pulse: 81  Resp: 16  Temp: 98.3 F (36.8 C)  TempSrc: Oral  SpO2: 100%  Weight: 232 lb (105.2 kg)  Height: 5' 9"  (1.753 m)      Assessment & Plan:    Willard Madrigal is a 41 y.o. female Essential hypertension - Plan: hydrochlorothiazide (MICROZIDE) 12.5 MG capsule  -Borderline control, but further history revealed that she had taken lisinopril HCTZ in the past. With recent testing not indicating diabetes, thiazide diuretic may be  a better option. Stop lisinopril, start HCTZ 12.5 mg daily. Recheck in the next 1 month.  Other fatigue, Situational stress  -Mild anemia, but suspected fatigue from situational stressors and work.  -Start iron supplement for anemia, work on stress management as discussed last visit, and handout again given today. Agree with restarting exercise to help with stress management. RTC precautions if persists.  -Previous chest pains, asymptomatic recently, has follow-up with cardiology planned in a few days.  Menorrhagia with regular cycle - Plan: Ambulatory referral to Gynecology Iron deficiency anemia, unspecified iron deficiency anemia type - Plan: Ambulatory referral to Gynecology  -Long-standing heavy menses with clots. She requests evaluation/treatment with OB/GYN, so referral was placed. In meantime did recommend iron supplement once per day.  Meds ordered this encounter  Medications  . hydrochlorothiazide (MICROZIDE) 12.5 MG capsule    Sig: Take 1 capsule (12.5 mg total) by mouth daily.    Dispense:  30 capsule    Refill:  1   Patient  Instructions   Stop lisinopril.  Start hydrochlorothiazide 12.26m once per day.  Start iron supplement - 3225monce per day. I will refer you to ObGYN to discuss the heavy menses and treatment options.  Work on stCareers information officeriscussed, and let me know if you would like to meet with a therapist to discuss stress further. Return to the clinic or go to the nearest emergency room if any of your symptoms worsen or new symptoms occur.   Stress and Stress Management Stress is a normal reaction to life events. It is what you feel when life demands more than you are used to or more than you can handle. Some stress can be useful. For example, the stress reaction can help you catch the last bus of the day, study for a test, or meet a deadline at work. But stress that occurs too often or for too long can cause problems. It can affect your emotional health and interfere with relationships and normal daily activities. Too much stress can weaken your immune system and increase your risk for physical illness. If you already have a medical problem, stress can make it worse. What are the causes? All sorts of life events may cause stress. An event that causes stress for one person may not be stressful for another person. Major life events commonly cause stress. These may be positive or negative. Examples include losing your job, moving into a new home, getting married, having a baby, or losing a loved one. Less obvious life events may also cause stress, especially if they occur day after day or in combination. Examples include working long hours, driving in traffic, caring for children, being in debt, or being in a difficult relationship. What are the signs or symptoms? Stress may cause emotional symptoms including, the following:  Anxiety. This is feeling worried, afraid, on edge, overwhelmed, or out of control.  Anger. This is feeling irritated or impatient.  Depression. This is feeling sad, down,  helpless, or guilty.  Difficulty focusing, remembering, or making decisions. Stress may cause physical symptoms, including the following:  Aches and pains. These may affect your head, neck, back, stomach, or other areas of your body.  Tight muscles or clenched jaw.  Low energy or trouble sleeping. Stress may cause unhealthy behaviors, including the following:  Eating to feel better (overeating) or skipping meals.  Sleeping too little, too much, or both.  Working too much or putting off tasks (procrastination).  Smoking, drinking alcohol, or using drugs  to feel better. How is this diagnosed? Stress is diagnosed through an assessment by your health care provider. Your health care provider will ask questions about your symptoms and any stressful life events.Your health care provider will also ask about your medical history and may order blood tests or other tests. Certain medical conditions and medicine can cause physical symptoms similar to stress. Mental illness can cause emotional symptoms and unhealthy behaviors similar to stress. Your health care provider may refer you to a mental health professional for further evaluation. How is this treated? Stress management is the recommended treatment for stress.The goals of stress management are reducing stressful life events and coping with stress in healthy ways. Techniques for reducing stressful life events include the following:  Stress identification. Self-monitor for stress and identify what causes stress for you. These skills may help you to avoid some stressful events.  Time management. Set your priorities, keep a calendar of events, and learn to say "no." These tools can help you avoid making too many commitments. Techniques for coping with stress include the following:  Rethinking the problem. Try to think realistically about stressful events rather than ignoring them or overreacting. Try to find the positives in a stressful  situation rather than focusing on the negatives.  Exercise. Physical exercise can release both physical and emotional tension. The key is to find a form of exercise you enjoy and do it regularly.  Relaxation techniques. These relax the body and mind. Examples include yoga, meditation, tai chi, biofeedback, deep breathing, progressive muscle relaxation, listening to music, being out in nature, journaling, and other hobbies. Again, the key is to find one or more that you enjoy and can do regularly.  Healthy lifestyle. Eat a balanced diet, get plenty of sleep, and do not smoke. Avoid using alcohol or drugs to relax.  Strong support network. Spend time with family, friends, or other people you enjoy being around.Express your feelings and talk things over with someone you trust. Counseling or talktherapy with a mental health professional may be helpful if you are having difficulty managing stress on your own. Medicine is typically not recommended for the treatment of stress.Talk to your health care provider if you think you need medicine for symptoms of stress. Follow these instructions at home:  Keep all follow-up visits as directed by your health care provider.  Take all medicines as directed by your health care provider. Contact a health care provider if:  Your symptoms get worse or you start having new symptoms.  You feel overwhelmed by your problems and can no longer manage them on your own. Get help right away if:  You feel like hurting yourself or someone else. This information is not intended to replace advice given to you by your health care provider. Make sure you discuss any questions you have with your health care provider. Document Released: 11/06/2000 Document Revised: 10/19/2015 Document Reviewed: 01/05/2013 Elsevier Interactive Patient Education  2017 [employer].    IF you received an x-ray today, you will receive an invoice from [facility]. Please contact  [facility] at [phone] with questions or concerns regarding your invoice.   IF you received labwork today, you will receive an invoice from [facility]. Please contact [facility] at [phone] with questions or concerns regarding your invoice.   Our billing staff will not be able to assist you with questions regarding bills from these companies.  You will be contacted with the lab results as soon as they are available. The fastest way  to get your results is to activate your My Chart account. Instructions are located on the last page of this paperwork. If you have not heard from [place] regarding the results in 2 weeks, please contact this office.      I personally performed the services described in this documentation, which was scribed in my presence. The recorded information has been reviewed and considered for accuracy and completeness, addended by me as needed, and agree with information above.  Signed,   Merri Ray, MD [facility].  07/24/16 9:27 PM

## 2016-07-23 NOTE — Patient Instructions (Addendum)
Stop lisinopril.  Start hydrochlorothiazide 12.86m once per day.  Start iron supplement - 3273monce per day. I will refer you to ObGYN to discuss the heavy menses and treatment options.  Work on stCareers information officeriscussed, and let me know if you would like to meet with a therapist to discuss stress further. Return to the clinic or go to the nearest emergency room if any of your symptoms worsen or new symptoms occur.   Stress and Stress Management Stress is a normal reaction to life events. It is what you feel when life demands more than you are used to or more than you can handle. Some stress can be useful. For example, the stress reaction can help you catch the last bus of the day, study for a test, or meet a deadline at work. But stress that occurs too often or for too long can cause problems. It can affect your emotional health and interfere with relationships and normal daily activities. Too much stress can weaken your immune system and increase your risk for physical illness. If you already have a medical problem, stress can make it worse. What are the causes? All sorts of life events may cause stress. An event that causes stress for one person may not be stressful for another person. Major life events commonly cause stress. These may be positive or negative. Examples include losing your job, moving into a new home, getting married, having a baby, or losing a loved one. Less obvious life events may also cause stress, especially if they occur day after day or in combination. Examples include working long hours, driving in traffic, caring for children, being in debt, or being in a difficult relationship. What are the signs or symptoms? Stress may cause emotional symptoms including, the following:  Anxiety. This is feeling worried, afraid, on edge, overwhelmed, or out of control.  Anger. This is feeling irritated or impatient.  Depression. This is feeling sad, down, helpless, or  guilty.  Difficulty focusing, remembering, or making decisions. Stress may cause physical symptoms, including the following:  Aches and pains. These may affect your head, neck, back, stomach, or other areas of your body.  Tight muscles or clenched jaw.  Low energy or trouble sleeping. Stress may cause unhealthy behaviors, including the following:  Eating to feel better (overeating) or skipping meals.  Sleeping too little, too much, or both.  Working too much or putting off tasks (procrastination).  Smoking, drinking alcohol, or using drugs to feel better. How is this diagnosed? Stress is diagnosed through an assessment by your health care provider. Your health care provider will ask questions about your symptoms and any stressful life events.Your health care provider will also ask about your medical history and may order blood tests or other tests. Certain medical conditions and medicine can cause physical symptoms similar to stress. Mental illness can cause emotional symptoms and unhealthy behaviors similar to stress. Your health care provider may refer you to a mental health professional for further evaluation. How is this treated? Stress management is the recommended treatment for stress.The goals of stress management are reducing stressful life events and coping with stress in healthy ways. Techniques for reducing stressful life events include the following:  Stress identification. Self-monitor for stress and identify what causes stress for you. These skills may help you to avoid some stressful events.  Time management. Set your priorities, keep a calendar of events, and learn to say "no." These tools can help you avoid making too many  commitments. Techniques for coping with stress include the following:  Rethinking the problem. Try to think realistically about stressful events rather than ignoring them or overreacting. Try to find the positives in a stressful situation rather  than focusing on the negatives.  Exercise. Physical exercise can release both physical and emotional tension. The key is to find a form of exercise you enjoy and do it regularly.  Relaxation techniques. These relax the body and mind. Examples include yoga, meditation, tai chi, biofeedback, deep breathing, progressive muscle relaxation, listening to music, being out in nature, journaling, and other hobbies. Again, the key is to find one or more that you enjoy and can do regularly.  Healthy lifestyle. Eat a balanced diet, get plenty of sleep, and do not smoke. Avoid using alcohol or drugs to relax.  Strong support network. Spend time with family, friends, or other people you enjoy being around.Express your feelings and talk things over with someone you trust. Counseling or talktherapy with a mental health professional may be helpful if you are having difficulty managing stress on your own. Medicine is typically not recommended for the treatment of stress.Talk to your health care provider if you think you need medicine for symptoms of stress. Follow these instructions at home:  Keep all follow-up visits as directed by your health care provider.  Take all medicines as directed by your health care provider. Contact a health care provider if:  Your symptoms get worse or you start having new symptoms.  You feel overwhelmed by your problems and can no longer manage them on your own. Get help right away if:  You feel like hurting yourself or someone else. This information is not intended to replace advice given to you by your health care provider. Make sure you discuss any questions you have with your health care provider. Document Released: 11/06/2000 Document Revised: 10/19/2015 Document Reviewed: 01/05/2013 Elsevier Interactive Patient Education  2017 [employer].    IF you received an x-ray today, you will receive an invoice from [facility]. Please contact [facility]  at [phone] with questions or concerns regarding your invoice.   IF you received labwork today, you will receive an invoice from [facility]. Please contact [facility] at [phone] with questions or concerns regarding your invoice.   Our billing staff will not be able to assist you with questions regarding bills from these companies.  You will be contacted with the lab results as soon as they are available. The fastest way to get your results is to activate your My Chart account. Instructions are located on the last page of this paperwork. If you have not heard from [place] regarding the results in 2 weeks, please contact this office.

## 2016-07-26 ENCOUNTER — Ambulatory Visit: Payer: BLUE CROSS/BLUE SHIELD | Admitting: Physician Assistant

## 2016-07-30 NOTE — Progress Notes (Signed)
Cardiology Office Note    Date:  08/01/2016   ID:  Robin Fox, DOB Feb 04, 1976, MRN [id number]  PCP:  No PCP Per Patient  Cardiologist:  New to [facility] - Dr. Debara Pickett   Chief Complaint  Patient presents with  . New Patient (Initial Visit)    Chest Pain    History of Present Illness:    Robin Fox is a 41 y.o. female with past medical history of HTN, gestational diabetes, history of pre-eclampsia, and no family history of premature CAD who presents to the office today as a new patient referral for evaluation of chest pain.   Was seen by her PCP on 07/16/2016 and reported new-onset dizziness with associated headache and double vision. Also described chest discomfort occurring the day prior which she reported was similar to GERD. Denied any associated symptoms and her pain improved after taking Zantac. Labs checked at that time showed WBC 7.3, Hgb 10.7, and platelets 430. Hgb A1c 5.6. K+ 4.7. Creatinine 0.80. TSH within normal limits. EKG with NSR, HR 73, with mild TWI along V1 and V2. She was started on HCTZ and Iron supplementation at her follow-up appointment on 2/27.  In talking with the patient today, she reports having episodes of chest discomfort for approximately 2 months. This can occur at rest or with exertion and she describes it as a burning sensation along her left pectoral region which radiates into her left arm. Denies any associated dyspnea, nausea, vomiting, diaphoresis, or presyncope. Her pain lasts for up to 30 minutes and is usually relieved with TUMS or Tylenol.  She reported experiencing dizziness at the time of her office visit on 2/20 and this has significantly improved since being started on HCTZ. Her blood pressure is well controlled at 122/72 today.  She denies any personal cardiac history. Does have a family history of CAD (maternal grandfather - age of onset 68), CVA (mother), and HTN (mother, brother). She does smoke a 0.5 ppd and has done  so for 20+ years. Occasional alcohol use (less than 5 drinks per week). No recreational drug use.   She does report being under increased stress over the past few months. 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 Has 4 children (ages 32-14).    Past Medical History:  Diagnosis Date  . Anemia    hx   . Gestational diabetes    Neg 2 hr test after preg  . Headache(784.0)   . Herpes genitalia    2013 - 1st outbreak  . Hypertension    2008  . Pre-eclampsia in third trimester   . Seasonal allergies   . Shortness of breath    w exertion  . Trichomonas   . Twins     Past Surgical History:  Procedure Laterality Date  . CESAREAN SECTION     X 2  . CESAREAN SECTION  04/21/2012   Procedure: CESAREAN SECTION;  Surgeon: Woodroe Mode, MD;  Location: [facility];  Service: Obstetrics;  Laterality: N/A;    Current Medications: Outpatient Medications Prior to Visit  Medication Sig Dispense Refill  . hydrochlorothiazide (MICROZIDE) 12.5 MG capsule Take 1 capsule (12.5 mg total) by mouth daily. 30 capsule 1   No facility-administered medications prior to visit.      Allergies:   Penicillins   Social History   Social History  . Marital status: Married    Spouse name: N/A  . Number of children:  N/A  . Years of education: N/A   Social History Main Topics  . Smoking status: Current Every Day Smoker    Packs/day: 0.50    Years: 17.00    Types: Cigarettes    Last attempt to quit: 10/11/2011  . Smokeless tobacco: Never Used  . Alcohol use 0.0 oz/week     Comment: socially but none with pregnancy  . Drug use: No  . Sexual activity: Yes    Birth control/ protection: None     Comment: pregnant   Other Topics Concern  . None   Social History Narrative  . None     Family History:  The patient's family history includes Heart disease (age of onset: 29) in her maternal grandfather; Hypertension in her brother and mother; Stroke in her  maternal grandmother and mother.   Review of Systems:   Please see the history of present illness.     General:  No chills, fever, night sweats or weight changes.  Cardiovascular:  No dyspnea on exertion, edema, orthopnea, palpitations, paroxysmal nocturnal dyspnea. Positive for chest pain and left arm pain.  Dermatological: No rash, lesions/masses Respiratory: No cough, dyspnea Urologic: No hematuria, dysuria Abdominal:   No nausea, vomiting, diarrhea, bright red blood per rectum, melena, or hematemesis Neurologic:  No visual changes, wkns, changes in mental status. All other systems reviewed and are otherwise negative except as noted above.   Physical Exam:    VS:  BP 128/80 Comment: Right arm.  Pulse 82   Ht 5\' 7"  (1.702 m)   Wt 231 lb (104.8 kg)   LMP 07/09/2016   BMI 36.18 kg/m    General: Well developed, well nourished [place] American female appearing in no acute distress. Head: Normocephalic, atraumatic, sclera non-icteric, no xanthomas, nares are without discharge.  Neck: No carotid bruits. JVD not elevated.  Lungs: Respirations regular and unlabored, without wheezes or rales.  Heart: Regular rate and rhythm. No S3 or S4.  No murmur, no rubs, or gallops appreciated. Abdomen: Soft, non-tender, non-distended with normoactive bowel sounds. No hepatomegaly. No rebound/guarding. No obvious abdominal masses. Msk:  Strength and tone appear normal for age. No joint deformities or effusions. Extremities: No clubbing or cyanosis. No edema.  Distal pedal pulses are 2+ bilaterally. Neuro: Alert and oriented X 3. Moves all extremities spontaneously. No focal deficits noted. Psych:  Responds to questions appropriately with a normal affect. Skin: No rashes or lesions noted  Wt Readings from Last 3 Encounters:  08/01/16 231 lb (104.8 kg)  07/23/16 232 lb (105.2 kg)  07/16/16 232 lb (105.2 kg)     Studies/Labs Reviewed:   EKG:  EKG is ordered today.  The ekg ordered today  demonstrates NSR, HR 82, with no acute ST or T-wave changes when compared to prior tracings.   Recent Labs: 07/16/2016: BUN 8; Creatinine, Ser 0.80; Hemoglobin 10.7; Potassium 4.7; Sodium 140; TSH 0.904   Lipid Panel No results found for: CHOL, TRIG, HDL, CHOLHDL, VLDL, LDLCALC, LDLDIRECT  Additional studies/ records that were reviewed today include:   EKG: 07/16/2016: NSR, HR 73, with mild TWI along V1 and V2.   Assessment:    1. Chest pain, unspecified type   2. Essential hypertension   3. Tobacco use      Plan:   In order of problems listed above:  1. Atypical Chest Pain - reports having episodes of chest discomfort for approximately 2 months which can occur at rest or with exertion. Occasionally radiates into her left arm and  can last for up to 30 minutes. Relieved with TUMS or Tylenol. Worse with anxiety.  - EKG today is without acute ischemic changes.  - Overall, her symptoms appear atypical for a cardiac etiology. She does have cardiac risk factors including hypertension, tobacco use, and family history of CAD. Will obtain an exercise tolerance test to assess for any ischemic changes. Recommended she have a FLP checked with her next annual physical for risk stratification. This was dicussed with Dr. Debara Pickett (DOD) as she is a new patient to our practice and he was in agreement with this assessment and plan.   2. Essential HTN - BP well-controlled at 128/80 during today's visit. - continue HCTZ 12.5mg  daily.   3. Tobacco Use - cessation advised.   Medication Adjustments/Labs and Tests Ordered: Current medicines are reviewed at length with the patient today.  Concerns regarding medicines are outlined above.  Medication changes, Labs and Tests ordered today are listed in the Patient Instructions below. Patient Instructions  Medication Instructions:  Your physician recommends that you continue on your current medications as directed. Please refer to the Current Medication list  given to you today.  Labwork: NONE  Testing/Procedures: Your physician has requested that you have an exercise tolerance test. For further information please visit [email or web address]. Please also follow instruction sheet, as given.  Follow-Up: Your physician wants you to follow-up as need with Dr. Debara Pickett pending stress test results.   If you need a refill on your cardiac medications before your next appointment, please call your pharmacy.    Signed, Erma Heritage, PA  08/01/2016 4:53 PM    [facility] [address], [place], [place]  [address] Phone: [phone]; Fax: [phone]  [address] [place], [place] [address] Phone: [phone]

## 2016-08-01 ENCOUNTER — Ambulatory Visit (INDEPENDENT_AMBULATORY_CARE_PROVIDER_SITE_OTHER): Payer: BLUE CROSS/BLUE SHIELD | Admitting: Student

## 2016-08-01 ENCOUNTER — Encounter: Payer: Self-pay | Admitting: Student

## 2016-08-01 VITALS — BP 128/80 | HR 82 | Ht 67.0 in | Wt 231.0 lb

## 2016-08-01 DIAGNOSIS — I10 Essential (primary) hypertension: Secondary | ICD-10-CM | POA: Diagnosis not present

## 2016-08-01 DIAGNOSIS — R079 Chest pain, unspecified: Secondary | ICD-10-CM | POA: Diagnosis not present

## 2016-08-01 DIAGNOSIS — Z720 Tobacco use: Secondary | ICD-10-CM | POA: Diagnosis not present

## 2016-08-01 NOTE — Patient Instructions (Addendum)
Medication Instructions:  Your physician recommends that you continue on your current medications as directed. Please refer to the Current Medication list given to you today.  Labwork: NONE  Testing/Procedures: Your physician has requested that you have an exercise tolerance test. For further information please visit [email or web address]. Please also follow instruction sheet, as given.  Follow-Up: Your physician wants you to follow-up as need with Dr. Debara Pickett pending stress test results.   If you need a refill on your cardiac medications before your next appointment, please call your pharmacy.

## 2016-08-08 ENCOUNTER — Telehealth (HOSPITAL_COMMUNITY): Payer: Self-pay

## 2016-08-08 NOTE — Telephone Encounter (Signed)
Encounter complete. 

## 2016-08-09 ENCOUNTER — Telehealth (HOSPITAL_COMMUNITY): Payer: Self-pay

## 2016-08-09 NOTE — Telephone Encounter (Signed)
Encounter complete. 

## 2016-08-13 ENCOUNTER — Ambulatory Visit (HOSPITAL_COMMUNITY)
Admission: RE | Admit: 2016-08-13 | Discharge: 2016-08-13 | Disposition: A | Discharge status: Home / Self Care | Payer: BLUE CROSS/BLUE SHIELD | Source: Ambulatory Visit | Attending: Cardiovascular Disease | Admitting: Cardiovascular Disease

## 2016-08-13 DIAGNOSIS — R079 Chest pain, unspecified: Secondary | ICD-10-CM | POA: Diagnosis not present

## 2016-08-14 LAB — EXERCISE TOLERANCE TEST
CHL CUP RESTING HR STRESS: 73 {beats}/min
CHL RATE OF PERCEIVED EXERTION: 18
CSEPED: 6 min
CSEPHR: 89 %
Estimated workload: 7.5 METS
Exercise duration (sec): 21 s
MPHR: 181 {beats}/min
Peak HR: 162 {beats}/min

## 2016-08-15 ENCOUNTER — Telehealth: Payer: Self-pay | Admitting: Student

## 2016-08-15 NOTE — Telephone Encounter (Signed)
Patient returning your call for results. Thanks.

## 2016-08-15 NOTE — Telephone Encounter (Signed)
Returned pts call and discussed her stress test results. Pt very appreciative of the call.

## 2016-08-22 ENCOUNTER — Ambulatory Visit: Payer: BLUE CROSS/BLUE SHIELD | Admitting: Family Medicine

## 2016-08-24 ENCOUNTER — Ambulatory Visit (INDEPENDENT_AMBULATORY_CARE_PROVIDER_SITE_OTHER): Payer: BLUE CROSS/BLUE SHIELD | Admitting: Family Medicine

## 2016-08-24 VITALS — BP 142/84 | HR 89 | Temp 97.9°F | Resp 20 | Ht 67.0 in | Wt 232.5 lb

## 2016-08-24 DIAGNOSIS — I10 Essential (primary) hypertension: Secondary | ICD-10-CM

## 2016-08-24 DIAGNOSIS — M7989 Other specified soft tissue disorders: Secondary | ICD-10-CM

## 2016-08-24 MED ORDER — HYDROCHLOROTHIAZIDE 25 MG PO TABS: 25.0000 mg | ORAL_TABLET | Freq: Every day | ORAL | 1 refills | Status: DC

## 2016-08-24 NOTE — Patient Instructions (Addendum)
The skin swelling next to your toe may be an early ingrown toenail. See information below. You can try soaks, elevation, tylenol if needed. If that area is still swollen next week, return for possible excision of an ingrown toenail. Return sooner if worse.   Increase HCTZ to 25 mg once per day for your blood pressure. You can take 2 of your current pills once per day until you fill the new prescription. Follow-up with me in next 6 weeks to reassess blood pressure.   Ingrown Toenail An ingrown toenail occurs when the corner or sides of your toenail grow into the surrounding skin. The big toe is most commonly affected, but it can happen to any of your toes. If your ingrown toenail is not treated, you will be at risk for infection. What are the causes? This condition may be caused by:  Wearing shoes that are too small or tight.  Injury or trauma, such as stubbing your toe or having your toe stepped on.  Improper cutting or care of your toenails.  Being born with (congenital) nail or foot abnormalities, such as having a nail that is too big for your toe. What increases the risk? Risk factors for an ingrown toenail include:  Age. Your nails tend to thicken as you get older, so ingrown nails are more common in older people.  Diabetes.  Cutting your toenails incorrectly.  Blood circulation problems. What are the signs or symptoms? Symptoms may include:  Pain, soreness, or tenderness.  Redness.  Swelling.  Hardening of the skin surrounding the toe. Your ingrown toenail may be infected if there is fluid, pus, or drainage. How is this diagnosed? An ingrown toenail may be diagnosed by medical history and physical exam. If your toenail is infected, your health care provider may test a sample of the drainage. How is this treated? Treatment depends on the severity of your ingrown toenail. Some ingrown toenails may be treated at home. More severe or infected ingrown toenails may require  surgery to remove all or part of the nail. Infected ingrown toenails may also be treated with antibiotic medicines. Follow these instructions at home:  If you were prescribed an antibiotic medicine, finish all of it even if you start to feel better.  Soak your foot in warm soapy water for 20 minutes, 3 times per day or as directed by your health care provider.  Carefully lift the edge of the nail away from the sore skin by wedging a small piece of cotton under the corner of the nail. This may help with the pain. Be careful not to cause more injury to the area.  Wear shoes that fit well. If your ingrown toenail is causing you pain, try wearing sandals, if possible.  Trim your toenails regularly and carefully. Do not cut them in a curved shape. Cut your toenails straight across. This prevents injury to the skin at the corners of the toenail.  Keep your feet clean and dry.  If you are having trouble walking and are given crutches by your health care provider, use them as directed.  Do not pick at your toenail or try to remove it yourself.  Take medicines only as directed by your health care provider.  Keep all follow-up visits as directed by your health care provider. This is important. Contact a health care provider if:  Your symptoms do not improve with treatment. Get help right away if:  You have red streaks that start at your foot and go up  your leg.  You have a fever.  You have increased redness, swelling, or pain.  You have fluid, blood, or pus coming from your toenail. This information is not intended to replace advice given to you by your health care provider. Make sure you discuss any questions you have with your health care provider. Document Released: 05/10/2000 Document Revised: 10/13/2015 Document Reviewed: 04/06/2014 Elsevier Interactive Patient Education  2017 [employer].   IF you received an x-ray today, you will receive an invoice from [facility].  Please contact [facility] at [phone] with questions or concerns regarding your invoice.   IF you received labwork today, you will receive an invoice from [facility]. Please contact [facility] at [phone] with questions or concerns regarding your invoice.   Our billing staff will not be able to assist you with questions regarding bills from these companies.  You will be contacted with the lab results as soon as they are available. The fastest way to get your results is to activate your My Chart account. Instructions are located on the last page of this paperwork. If you have not heard from [place] regarding the results in 2 weeks, please contact this office.

## 2016-08-24 NOTE — Progress Notes (Signed)
Subjective:  By signing my name below, I, Robin Fox, attest that this documentation has been prepared under the direction and in the presence of [name], MD Electronically Signed: Ladene Artist, ED Scribe 08/24/2016 at 3:56 PM.   Patient ID: Robin Fox, female    DOB: 1976-04-17, 41 y.o.   MRN: [id number]  Chief Complaint  Patient presents with  . Foot Pain    left big toe-hurts to walk on it x 2-3 weeks.    HPI Robin Fox is a 40 y.o. female who presents to Primary Care at [facility] follow-up HTN and left big toe pain for 2-3 weeks. No known injury. Pt has noticed swelling to the left great toe and some numbness. She states that she has been walking more often at work. She has tried Epson salt soaks without relief. Pt denies drainage from the area. No h/o gout.  HTN Pt followed up with cardiology and had a stress test done that was normal. She has been checking her BP outside of the office with average readings of 134/83. She denies chest pain, sob, chest palpitations, dizziness, light-headedness.   Patient Active Problem List   Diagnosis Date Noted  . Tobacco use 08/01/2016  . Routine postpartum follow-up 05/22/2012  . Mild or unspecified pre-eclampsia, with delivery 04/07/2012  . Abnormal maternal glucose tolerance, antepartum 03/27/2012  . History of IUGR (intrauterine growth retardation) and stillbirth, currently pregnant 02/12/2012  . History of stillbirth 12/12/2011  . Previous cesarean delivery, antepartum condition or complication 85/63/1497  . Obesity (BMI 30-39.9) 11/21/2011  . Hx of herpes genitalis 11/21/2011  . Hypertension in pregnancy, essential, antepartum 11/09/2011  . Essential hypertension 11/09/2011  . AMA (advanced maternal age) multigravida 35+ 11/09/2011   Past Medical History:  Diagnosis Date  . Anemia    hx   . Gestational diabetes    Neg 2 hr test after preg  . Headache(784.0)   . Herpes genitalia    2013  - 1st outbreak  . Hypertension    2008  . Pre-eclampsia in third trimester   . Seasonal allergies   . Shortness of breath    w exertion  . Trichomonas   . Twins    Past Surgical History:  Procedure Laterality Date  . CESAREAN SECTION     X 2  . CESAREAN SECTION  04/21/2012   Procedure: CESAREAN SECTION;  Surgeon: Woodroe Mode, MD;  Location: [facility];  Service: Obstetrics;  Laterality: N/A;   Allergies  Allergen Reactions  . Penicillins Hives, Shortness Of Breath and Swelling    Has patient had a PCN reaction causing immediate rash, facial/tongue/throat swelling, SOB or lightheadedness with hypotension: no Has patient had a PCN reaction causing severe rash involving mucus membranes or skin necrosis:no Has patient had a PCN reaction that required hospitalization no Has patient had a PCN reaction occurring within the last 10 years: {Yes If all of the above answers are "NO", then may proceed with Cephalosporin use.   Prior to Admission medications   Medication Sig Start Date End Date Taking? Authorizing Provider  hydrochlorothiazide (MICROZIDE) 12.5 MG capsule Take 1 capsule (12.5 mg total) by mouth daily. 07/23/16  Yes [name], MD   Social History   Social History  . Marital status: Married    Spouse name: N/A  . Number of children: N/A  . Years of education: N/A   Occupational History  . Not on file.   Social History Main Topics  .  Smoking status: Current Every Day Smoker    Packs/day: 0.50    Years: 17.00    Types: Cigarettes    Last attempt to quit: 10/11/2011  . Smokeless tobacco: Never Used  . Alcohol use 0.0 oz/week     Comment: socially but none with pregnancy  . Drug use: No  . Sexual activity: Yes    Birth control/ protection: None     Comment: pregnant   Other Topics Concern  . Not on file   Social History Narrative  . No narrative on file   Review of Systems  Respiratory: Negative for shortness of breath.   Cardiovascular: Negative for  chest pain and palpitations.  Musculoskeletal: Positive for arthralgias and joint swelling.  Skin: Negative for color change and wound.  Neurological: Positive for numbness. Negative for dizziness and light-headedness.      Objective:   Physical Exam  Constitutional: She is oriented to person, place, and time. She appears well-developed and well-nourished.  HENT:  Head: Normocephalic and atraumatic.  Eyes: Conjunctivae and EOM are normal. Pupils are equal, round, and reactive to light.  Neck: Carotid bruit is not present.  Cardiovascular: Normal rate, regular rhythm, normal heart sounds and intact distal pulses.   Pulmonary/Chest: Effort normal and breath sounds normal.  Abdominal: Soft. She exhibits no pulsatile midline mass. There is no tenderness.  Musculoskeletal:  L great toe: prominence of soft tissue on distal great toe lateral to toe. No focal bony tenderness. Tender along medial aspect. MTP is nontender.   Neurological: She is alert and oriented to person, place, and time.  Skin: Skin is warm and dry.  L great toe: Crack in distal great toenail. No pus or discharge. No rash or erythema.   Psychiatric: She has a normal mood and affect. Her behavior is normal.  Vitals reviewed.  Vitals:   08/24/16 1500  BP: (!) 142/84  Pulse: 89  Resp: 20  Temp: 97.9 F (36.6 C)  TempSrc: Oral  SpO2: 100%  Weight: 232 lb 8 oz (105.5 kg)  Height: 5\' 7"  (1.702 m)      Assessment & Plan:   Robin Fox is a 41 y.o. female Toe swelling  - Possibly early ingrown toenail. Based on current appearance, decided on initial soaks, location, Tylenol if needed, and if persistent swelling within the next 1 week, return for possible excision/wedge excision of ingrown toenail.  Essential hypertension - Plan: hydrochlorothiazide (HYDRODIURIL) 25 MG tablet  -Still running high, increase HCTZ to 25 mg daily, and recheck blood pressure in 6 weeks.  Meds ordered this encounter    Medications  . hydrochlorothiazide (HYDRODIURIL) 25 MG tablet    Sig: Take 1 tablet (25 mg total) by mouth daily.    Dispense:  90 tablet    Refill:  1   Patient Instructions    The skin swelling next to your toe may be an early ingrown toenail. See information below. You can try soaks, elevation, tylenol if needed. If that area is still swollen next week, return for possible excision of an ingrown toenail. Return sooner if worse.   Increase HCTZ to 25 mg once per day for your blood pressure. You can take 2 of your current pills once per day until you fill the new prescription. Follow-up with me in next 6 weeks to reassess blood pressure.   Ingrown Toenail An ingrown toenail occurs when the corner or sides of your toenail grow into the surrounding skin. The big toe is most commonly  affected, but it can happen to any of your toes. If your ingrown toenail is not treated, you will be at risk for infection. What are the causes? This condition may be caused by:  Wearing shoes that are too small or tight.  Injury or trauma, such as stubbing your toe or having your toe stepped on.  Improper cutting or care of your toenails.  Being born with (congenital) nail or foot abnormalities, such as having a nail that is too big for your toe. What increases the risk? Risk factors for an ingrown toenail include:  Age. Your nails tend to thicken as you get older, so ingrown nails are more common in older people.  Diabetes.  Cutting your toenails incorrectly.  Blood circulation problems. What are the signs or symptoms? Symptoms may include:  Pain, soreness, or tenderness.  Redness.  Swelling.  Hardening of the skin surrounding the toe. Your ingrown toenail may be infected if there is fluid, pus, or drainage. How is this diagnosed? An ingrown toenail may be diagnosed by medical history and physical exam. If your toenail is infected, your health care provider may test a sample of the  drainage. How is this treated? Treatment depends on the severity of your ingrown toenail. Some ingrown toenails may be treated at home. More severe or infected ingrown toenails may require surgery to remove all or part of the nail. Infected ingrown toenails may also be treated with antibiotic medicines. Follow these instructions at home:  If you were prescribed an antibiotic medicine, finish all of it even if you start to feel better.  Soak your foot in warm soapy water for 20 minutes, 3 times per day or as directed by your health care provider.  Carefully lift the edge of the nail away from the sore skin by wedging a small piece of cotton under the corner of the nail. This may help with the pain. Be careful not to cause more injury to the area.  Wear shoes that fit well. If your ingrown toenail is causing you pain, try wearing sandals, if possible.  Trim your toenails regularly and carefully. Do not cut them in a curved shape. Cut your toenails straight across. This prevents injury to the skin at the corners of the toenail.  Keep your feet clean and dry.  If you are having trouble walking and are given crutches by your health care provider, use them as directed.  Do not pick at your toenail or try to remove it yourself.  Take medicines only as directed by your health care provider.  Keep all follow-up visits as directed by your health care provider. This is important. Contact a health care provider if:  Your symptoms do not improve with treatment. Get help right away if:  You have red streaks that start at your foot and go up your leg.  You have a fever.  You have increased redness, swelling, or pain.  You have fluid, blood, or pus coming from your toenail. This information is not intended to replace advice given to you by your health care provider. Make sure you discuss any questions you have with your health care provider. Document Released: 05/10/2000 Document Revised:  10/13/2015 Document Reviewed: 04/06/2014 Elsevier Interactive Patient Education  2017 [employer].   IF you received an x-ray today, you will receive an invoice from [facility]. Please contact [facility] at [phone] with questions or concerns regarding your invoice.   IF you received labwork today, you will receive an  invoice from [facility]. Please contact [facility] at [phone] with questions or concerns regarding your invoice.   Our billing staff will not be able to assist you with questions regarding bills from these companies.  You will be contacted with the lab results as soon as they are available. The fastest way to get your results is to activate your My Chart account. Instructions are located on the last page of this paperwork. If you have not heard from [place] regarding the results in 2 weeks, please contact this office.       I personally performed the services described in this documentation, which was scribed in my presence. The recorded information has been reviewed and considered for accuracy and completeness, addended by me as needed, and agree with information above.  Signed,   Merri Ray, MD [facility].  08/28/16 10:57 PM

## 2016-08-26 ENCOUNTER — Ambulatory Visit (INDEPENDENT_AMBULATORY_CARE_PROVIDER_SITE_OTHER): Payer: BLUE CROSS/BLUE SHIELD | Admitting: Gynecology

## 2016-08-26 ENCOUNTER — Encounter: Payer: Self-pay | Admitting: Gynecology

## 2016-08-26 VITALS — BP 142/88 | Ht 67.75 in | Wt 228.6 lb

## 2016-08-26 DIAGNOSIS — Z113 Encounter for screening for infections with a predominantly sexual mode of transmission: Secondary | ICD-10-CM | POA: Diagnosis not present

## 2016-08-26 DIAGNOSIS — Z01411 Encounter for gynecological examination (general) (routine) with abnormal findings: Secondary | ICD-10-CM

## 2016-08-26 DIAGNOSIS — D500 Iron deficiency anemia secondary to blood loss (chronic): Secondary | ICD-10-CM | POA: Diagnosis not present

## 2016-08-26 DIAGNOSIS — N920 Excessive and frequent menstruation with regular cycle: Secondary | ICD-10-CM | POA: Diagnosis not present

## 2016-08-26 DIAGNOSIS — A6009 Herpesviral infection of other urogenital tract: Secondary | ICD-10-CM

## 2016-08-26 MED ORDER — VALACYCLOVIR HCL 500 MG PO TABS: 500.0000 mg | ORAL_TABLET | Freq: Every day | ORAL | 6 refills | Status: DC

## 2016-08-26 NOTE — Progress Notes (Signed)
Robin Fox April 21, 1976 [id number]   History:    41 y.o.  for annual gyn exam is a new patient to the practice. She stated her last gynecological exam was in 2014. She denies any past history of any abnormal Pap smears. She states she has normal monthly cycles the first 2 or 3 days are heavy. Her husband has had a vasectomy. Patient states that she gets more than 3-4 genital herpes outbreaks a year and only takes Valtrex on a when necessary basis and would like to suppress the recurrent episodes. Patient interested in a full STD screen today as well. Patient states she smokes 10 cigarettes per day and her last chest x-ray was less than 3 years ago. Her PCP has done her blood work with the exception of a lipid profile has been treated for mild anemia probably as a result of her chronic blood loss from her heavy. The first 2-3 days her cycle.  Past medical history,surgical history, family history and social history were all reviewed and documented in the EPIC chart.  Gynecologic History Patient's last menstrual period was 08/17/2016. Contraception: vasectomy Last Pap: 2014. Results were: normal Last mammogram: No previous study. Results were: No previous study  Obstetric History OB History  Gravida Para Term Preterm AB Living  4 3 1 2  0 4  SAB TAB Ectopic Multiple Live Births  0 0 0 2 4    # Outcome Date GA Lbr Len/2nd Weight Sex Delivery Anes PTL Lv  4 Term 04/21/12 [redacted]w[redacted]d  8 lb 2.2 oz (3.69 kg) M CS-Vac Spinal  LIV  3A Preterm 08/15/06 [redacted]w[redacted]d  2 lb 1 oz (0.936 kg) M CS-Unspec Spinal N LIV     Birth Comments: Twin gestation; Gest Diabetes, PreX; IUGR for twin B - NO PRETERM LABOR C-Section  3B Preterm 08/15/06 [redacted]w[redacted]d  1 lb 1 oz (0.482 kg) M CS-Unspec Spinal Y LIV     Birth Comments: Twin gestation; Gest Diabetes, PreX; IUGR for twin B  2A Preterm 04/19/02 [redacted]w[redacted]d  5 lb 1 oz (2.296 kg) M CS-Unspec EPI N LIV     Birth Comments: Twin gestation 24 weeks one twin was still birth  (female, 3lbs - knot) - NO PRETERM LABOR CSECTION FOR STILLBIRTH  2B Preterm 04/19/02 [redacted]w[redacted]d  3 lb (1.361 kg) M CS-Unspec EPI N FD     Birth Comments: Possible vertical incision > obtain records  1 Gravida                ROS: A ROS was performed and pertinent positives and negatives are included in the history.  GENERAL: No fevers or chills. HEENT: No change in vision, no earache, sore throat or sinus congestion. NECK: No pain or stiffness. CARDIOVASCULAR: No chest pain or pressure. No palpitations. PULMONARY: No shortness of breath, cough or wheeze. GASTROINTESTINAL: No abdominal pain, nausea, vomiting or diarrhea, melena or bright red blood per rectum. GENITOURINARY: No urinary frequency, urgency, hesitancy or dysuria. MUSCULOSKELETAL: No joint or muscle pain, no back pain, no recent trauma. DERMATOLOGIC: No rash, no itching, no lesions. ENDOCRINE: No polyuria, polydipsia, no heat or cold intolerance. No recent change in weight. HEMATOLOGICAL: No anemia or easy bruising or bleeding. NEUROLOGIC: No headache, seizures, numbness, tingling or weakness. PSYCHIATRIC: No depression, no loss of interest in normal activity or change in sleep pattern.     Exam: chaperone present  BP (!) 142/88   Ht 5' 7.75" (1.721 m)   Wt 228 lb 9.6 oz (103.7 kg)  LMP 08/17/2016   BMI 35.02 kg/m   Body mass index is 35.02 kg/m.  General appearance : Well developed well nourished female. No acute distress HEENT: Eyes: no retinal hemorrhage or exudates,  Neck supple, trachea midline, no carotid bruits, no thyroidmegaly Lungs: Clear to auscultation, no rhonchi or wheezes, or rib retractions  Heart: Regular rate and rhythm, no murmurs or gallops Breast:Examined in sitting and supine position were symmetrical in appearance, no palpable masses or tenderness,  no skin retraction, no nipple inversion, no nipple discharge, no skin discoloration, no axillary or supraclavicular lymphadenopathy Abdomen: no palpable masses or  tenderness, no rebound or guarding Extremities: no edema or skin discoloration or tenderness  Pelvic:  Bartholin, Urethra, Skene Glands: Within normal limits             Vagina: No gross lesions or discharge  Cervix: No gross lesions or discharge  Uterus  anteverted, normal size, shape and consistency, non-tender and mobile  Adnexa  Without masses or tenderness  Anus and perineum  normal   Rectovaginal  normal sphincter tone without palpated masses or tenderness             Hemoccult not indicated     Assessment/Plan:  41 y.o. female for annual exam requesting full STD screening today. She will have a Pap smear with HPV screening as well as GC and Chlamydia culture. She will return back to the office later this week in a fasting state for fasting lipid profile. At which time to complete the STD screening she will have the following blood work drawn: HIV, RPR, hepatitis B and C. Patient was offered a Mirena IUD for cycle control and will think about it. She declined the flu vaccine today. For HSV suppression she'll be placed on Valtrex 500 mg take 1 by mouth daily. A requisition to schedule her mammogram when she turns 41 in June of this year.   Terrance Mass MD, 4:39 PM 08/26/2016

## 2016-08-26 NOTE — Addendum Note (Signed)
Addended by: Dorothyann Gibbs on: 08/26/2016 05:00 PM   Modules accepted: Orders

## 2016-08-26 NOTE — Patient Instructions (Addendum)
Valacyclovir caplets What is this medicine? VALACYCLOVIR (val ay SYE kloe veer) is an antiviral medicine. It is used to treat or prevent infections caused by certain kinds of viruses. Examples of these infections include herpes and shingles. This medicine will not cure herpes. This medicine may be used for other purposes; ask your health care provider or pharmacist if you have questions. COMMON BRAND NAME(S): Valtrex What should I tell my health care provider before I take this medicine? They need to know if you have any of these conditions: -acquired immunodeficiency syndrome (AIDS) -any other condition that may weaken the immune system -bone marrow or kidney transplant -kidney disease -an unusual or allergic reaction to valacyclovir, acyclovir, ganciclovir, valganciclovir, other medicines, foods, dyes, or preservatives -pregnant or trying to get pregnant -breast-feeding How should I use this medicine? Take this medicine by mouth with a glass of water. Follow the directions on the prescription label. You can take this medicine with or without food. Take your doses at regular intervals. Do not take your medicine more often than directed. Finish the full course prescribed by your doctor or health care professional even if you think your condition is better. Do not stop taking except on the advice of your doctor or health care professional. Talk to your pediatrician regarding the use of this medicine in children. While this drug may be prescribed for children as young as 2 years for selected conditions, precautions do apply. Overdosage: If you think you have taken too much of this medicine contact a poison control center or emergency room at once. NOTE: This medicine is only for you. Do not share this medicine with others. What if I miss a dose? If you miss a dose, take it as soon as you can. If it is almost time for your next dose, take only that dose. Do not take double or extra doses. What may  interact with this medicine? -cimetidine -probenecid This list may not describe all possible interactions. Give your health care provider a list of all the medicines, herbs, non-prescription drugs, or dietary supplements you use. Also tell them if you smoke, drink alcohol, or use illegal drugs. Some items may interact with your medicine. What should I watch for while using this medicine? Tell your doctor or health care professional if your symptoms do not start to get better after 1 week. This medicine works best when taken early in the course of an infection, within the first 59 hours. Begin treatment as soon as possible after the first signs of infection like tingling, itching, or pain in the affected area. It is possible that genital herpes may still be spread even when you are not having symptoms. Always use safer sex practices like condoms made of latex or polyurethane whenever you have sexual contact. You should stay well hydrated while taking this medicine. Drink plenty of fluids. What side effects may I notice from receiving this medicine? Side effects that you should report to your doctor or health care professional as soon as possible: -allergic reactions like skin rash, itching or hives, swelling of the face, lips, or tongue -aggressive behavior -confusion -hallucinations -problems with balance, talking, walking -stomach pain -tremor -trouble passing urine or change in the amount of urine Side effects that usually do not require medical attention (report to your doctor or health care professional if they continue or are bothersome): -dizziness -headache -nausea, vomiting This list may not describe all possible side effects. Call your doctor for medical advice about side effects. You  may report side effects to FDA at 1-800-FDA-1088. Where should I keep my medicine? Keep out of the reach of children. Store at room temperature between 15 and 25 degrees C (59 and 77 degrees F). Keep  container tightly closed. Throw away any unused medicine after the expiration date. NOTE: This sheet is a summary. It may not cover all possible information. If you have questions about this medicine, talk to your doctor, pharmacist, or health care provider.  2018 Elsevier/Gold Standard (2012-04-28 16:34:05)  Steps to Quit Smoking Smoking tobacco can be harmful to your health and can affect almost every organ in your body. Smoking puts you, and those around you, at risk for developing many serious chronic diseases. Quitting smoking is difficult, but it is one of the best things that you can do for your health. It is never too late to quit. What are the benefits of quitting smoking? When you quit smoking, you lower your risk of developing serious diseases and conditions, such as:  Lung cancer or lung disease, such as COPD.  Heart disease.  Stroke.  Heart attack.  Infertility.  Osteoporosis and bone fractures. Additionally, symptoms such as coughing, wheezing, and shortness of breath may get better when you quit. You may also find that you get sick less often because your body is stronger at fighting off colds and infections. If you are pregnant, quitting smoking can help to reduce your chances of having a baby of low birth weight. How do I get ready to quit? When you decide to quit smoking, create a plan to make sure that you are successful. Before you quit:  Pick a date to quit. Set a date within the next two weeks to give you time to prepare.  Write down the reasons why you are quitting. Keep this list in places where you will see it often, such as on your bathroom mirror or in your car or wallet.  Identify the people, places, things, and activities that make you want to smoke (triggers) and avoid them. Make sure to take these actions:  Throw away all cigarettes at home, at work, and in your car.  Throw away smoking accessories, such as Scientist, research (medical).  Clean your car and  make sure to empty the ashtray.  Clean your home, including curtains and carpets.  Tell your family, friends, and coworkers that you are quitting. Support from your loved ones can make quitting easier.  Talk with your health care provider about your options for quitting smoking.  Find out what treatment options are covered by your health insurance. What strategies can I use to quit smoking? Talk with your healthcare provider about different strategies to quit smoking. Some strategies include:  Quitting smoking altogether instead of gradually lessening how much you smoke over a period of time. Research shows that quitting "cold [place]" is more successful than gradually quitting.  Attending in-person counseling to help you build problem-solving skills. You are more likely to have success in quitting if you attend several counseling sessions. Even short sessions of 10 minutes can be effective.  Finding resources and support systems that can help you to quit smoking and remain smoke-free after you quit. These resources are most helpful when you use them often. They can include:  Online chats with a Social worker.  Telephone quitlines.  Printed Furniture conservator/restorer.  Support groups or group counseling.  Text messaging programs.  Mobile phone applications.  Taking medicines to help you quit smoking. (If you are pregnant or breastfeeding,  talk with your health care provider first.) Some medicines contain nicotine and some do not. Both types of medicines help with cravings, but the medicines that include nicotine help to relieve withdrawal symptoms. Your health care provider may recommend:  Nicotine patches, gum, or lozenges.  Nicotine inhalers or sprays.  Non-nicotine medicine that is taken by mouth. Talk with your health care provider about combining strategies, such as taking medicines while you are also receiving in-person counseling. Using these two strategies together makes you more  likely to succeed in quitting than if you used either strategy on its own. If you are pregnant or breastfeeding, talk with your health care provider about finding counseling or other support strategies to quit smoking. Do not take medicine to help you quit smoking unless told to do so by your health care provider. What things can I do to make it easier to quit? Quitting smoking might feel overwhelming at first, but there is a lot that you can do to make it easier. Take these important actions:  Reach out to your family and friends and ask that they support and encourage you during this time. Call telephone quitlines, reach out to support groups, or work with a counselor for support.  Ask people who smoke to avoid smoking around you.  Avoid places that trigger you to smoke, such as bars, parties, or smoke-break areas at work.  Spend time around people who do not smoke.  Lessen stress in your life, because stress can be a smoking trigger for some people. To lessen stress, try:  Exercising regularly.  Deep-breathing exercises.  Yoga.  Meditating.  Performing a body scan. This involves closing your eyes, scanning your body from head to toe, and noticing which parts of your body are particularly tense. Purposefully relax the muscles in those areas.  Download or purchase mobile phone or tablet apps (applications) that can help you stick to your quit plan by providing reminders, tips, and encouragement. There are many free apps, such as QuitGuide from the [employer] Office manager for Disease Control and Prevention). You can find other support for quitting smoking (smoking cessation) through smokefree.gov and other websites. How will I feel when I quit smoking? Within the first 24 hours of quitting smoking, you may start to feel some withdrawal symptoms. These symptoms are usually most noticeable 2-3 days after quitting, but they usually do not last beyond 2-3 weeks. Changes or symptoms that you might  experience include:  Mood swings.  Restlessness, anxiety, or irritation.  Difficulty concentrating.  Dizziness.  Strong cravings for sugary foods in addition to nicotine.  Mild weight gain.  Constipation.  Nausea.  Coughing or a sore throat.  Changes in how your medicines work in your body.  A depressed mood.  Difficulty sleeping (insomnia). After the first 2-3 weeks of quitting, you may start to notice more positive results, such as:  Improved sense of smell and taste.  Decreased coughing and sore throat.  Slower heart rate.  Lower blood pressure.  Clearer skin.  The ability to breathe more easily.  Fewer sick days. Quitting smoking is very challenging for most people. Do not get discouraged if you are not successful the first time. Some people need to make many attempts to quit before they achieve long-term success. Do your best to stick to your quit plan, and talk with your health care provider if you have any questions or concerns. This information is not intended to replace advice given to you by your health care provider.  Make sure you discuss any questions you have with your health care provider. Document Released: 05/07/2001 Document Revised: 01/09/2016 Document Reviewed: 09/27/2014 Elsevier Interactive Patient Education  2017 [employer].

## 2016-08-27 DIAGNOSIS — Z113 Encounter for screening for infections with a predominantly sexual mode of transmission: Secondary | ICD-10-CM | POA: Diagnosis not present

## 2016-08-27 DIAGNOSIS — Z01411 Encounter for gynecological examination (general) (routine) with abnormal findings: Secondary | ICD-10-CM | POA: Diagnosis not present

## 2016-08-27 DIAGNOSIS — N920 Excessive and frequent menstruation with regular cycle: Secondary | ICD-10-CM | POA: Diagnosis not present

## 2016-08-28 ENCOUNTER — Other Ambulatory Visit: Payer: BLUE CROSS/BLUE SHIELD

## 2016-08-28 DIAGNOSIS — Z01411 Encounter for gynecological examination (general) (routine) with abnormal findings: Secondary | ICD-10-CM | POA: Diagnosis not present

## 2016-08-28 DIAGNOSIS — Z113 Encounter for screening for infections with a predominantly sexual mode of transmission: Secondary | ICD-10-CM | POA: Diagnosis not present

## 2016-08-28 LAB — HIV ANTIBODY (ROUTINE TESTING W REFLEX): HIV 1&2 Ab, 4th Generation: NONREACTIVE

## 2016-08-28 LAB — HEPATITIS B SURFACE ANTIGEN: Hepatitis B Surface Ag: NEGATIVE

## 2016-08-28 LAB — LIPID PANEL
CHOLESTEROL: 163 mg/dL (ref <200)
HDL: 47 mg/dL — AB (ref >50)
LDL CALC: 101 mg/dL — AB (ref <100)
Total CHOL/HDL Ratio: 3.5 Ratio (ref <5.0)
Triglycerides: 75 mg/dL (ref <150)
VLDL: 15 mg/dL (ref <30)

## 2016-08-28 LAB — HEPATITIS C ANTIBODY: HCV Ab: NEGATIVE

## 2016-08-29 LAB — RPR

## 2016-08-30 LAB — PAP IG, CT-NG NAA, HPV HIGH-RISK
CHLAMYDIA PROBE AMP: NOT DETECTED
GC Probe Amp: NOT DETECTED
HPV DNA HIGH RISK: NOT DETECTED

## 2016-09-11 ENCOUNTER — Encounter (HOSPITAL_COMMUNITY): Payer: Self-pay | Admitting: *Deleted

## 2016-09-11 ENCOUNTER — Other Ambulatory Visit: Payer: Self-pay

## 2016-09-11 DIAGNOSIS — Z5321 Procedure and treatment not carried out due to patient leaving prior to being seen by health care provider: Secondary | ICD-10-CM | POA: Insufficient documentation

## 2016-09-11 DIAGNOSIS — R079 Chest pain, unspecified: Secondary | ICD-10-CM | POA: Insufficient documentation

## 2016-09-11 DIAGNOSIS — Z79899 Other long term (current) drug therapy: Secondary | ICD-10-CM | POA: Insufficient documentation

## 2016-09-11 DIAGNOSIS — I10 Essential (primary) hypertension: Secondary | ICD-10-CM | POA: Insufficient documentation

## 2016-09-11 DIAGNOSIS — F17210 Nicotine dependence, cigarettes, uncomplicated: Secondary | ICD-10-CM | POA: Insufficient documentation

## 2016-09-11 LAB — CBC
HEMATOCRIT: 32.7 % — AB (ref 36.0–46.0)
Hemoglobin: 10.3 g/dL — ABNORMAL LOW (ref 12.0–15.0)
MCH: 19.8 pg — ABNORMAL LOW (ref 26.0–34.0)
MCHC: 31.5 g/dL (ref 30.0–36.0)
MCV: 62.9 fL — AB (ref 78.0–100.0)
Platelets: 365 10*3/uL (ref 150–400)
RBC: 5.2 MIL/uL — ABNORMAL HIGH (ref 3.87–5.11)
RDW: 17.9 % — AB (ref 11.5–15.5)
WBC: 8 10*3/uL (ref 4.0–10.5)

## 2016-09-11 NOTE — ED Triage Notes (Signed)
The pt is c/o chest pain and arm pain for 2 hours  She was just worked up for the same a month ago with a stress test and everything else  No cardiac find.  Headache also  lmp 2 weeks ago

## 2016-09-12 ENCOUNTER — Emergency Department (HOSPITAL_COMMUNITY): Payer: BLUE CROSS/BLUE SHIELD

## 2016-09-12 ENCOUNTER — Emergency Department (HOSPITAL_COMMUNITY)
Admission: EM | Admit: 2016-09-12 | Discharge: 2016-09-12 | Disposition: A | Discharge status: Home / Self Care | Payer: BLUE CROSS/BLUE SHIELD | Attending: Emergency Medicine | Admitting: Emergency Medicine

## 2016-09-12 DIAGNOSIS — R079 Chest pain, unspecified: Secondary | ICD-10-CM | POA: Diagnosis not present

## 2016-09-12 LAB — BASIC METABOLIC PANEL
Anion gap: 6 (ref 5–15)
BUN: 8 mg/dL (ref 6–20)
CO2: 26 mmol/L (ref 22–32)
Calcium: 8.8 mg/dL — ABNORMAL LOW (ref 8.9–10.3)
Chloride: 98 mmol/L — ABNORMAL LOW (ref 101–111)
Creatinine, Ser: 0.85 mg/dL (ref 0.44–1.00)
GFR calc Af Amer: >60 mL/min (ref >60)
GFR calc non Af Amer: >60 mL/min (ref >60)
Glucose, Bld: 102 mg/dL — ABNORMAL HIGH (ref 65–99)
Potassium: 3.3 mmol/L — ABNORMAL LOW (ref 3.5–5.1)
SODIUM: 130 mmol/L — AB (ref 135–145)

## 2016-09-12 LAB — TROPONIN I: Troponin I: <0.03 ng/mL (ref <0.03)

## 2016-10-09 ENCOUNTER — Encounter: Payer: Self-pay | Admitting: Gynecology

## 2017-03-13 ENCOUNTER — Encounter (HOSPITAL_COMMUNITY): Payer: Self-pay | Admitting: Emergency Medicine

## 2017-03-13 ENCOUNTER — Ambulatory Visit (HOSPITAL_COMMUNITY)
Admission: EM | Admit: 2017-03-13 | Discharge: 2017-03-13 | Disposition: A | Discharge status: Home / Self Care | Payer: BLUE CROSS/BLUE SHIELD | Attending: Emergency Medicine | Admitting: Emergency Medicine

## 2017-03-13 DIAGNOSIS — S39012A Strain of muscle, fascia and tendon of lower back, initial encounter: Secondary | ICD-10-CM

## 2017-03-13 MED ORDER — METHOCARBAMOL 500 MG PO TABS: 500.0000 mg | ORAL_TABLET | Freq: Two times a day (BID) | ORAL | 0 refills | Status: DC

## 2017-03-13 NOTE — ED Triage Notes (Signed)
Pt c/o lower back pain off and on for a couple weeks. Worse with bending over.

## 2017-03-13 NOTE — Discharge Instructions (Signed)
Ibuprofen 800mg  three times a day, take with food. Muscle relaxer up to twice a day, avoid driving or alcohol as may cause drowsiness. Heat and/or ice application. Follow up with your primary care provider in the next 1-2 weeks if symptoms persist or if do not improve.  Return sooner if pain worsens, develop weakness, fevers or additional symptoms.

## 2017-03-13 NOTE — ED Provider Notes (Signed)
[facility]    CSN: [id number] Arrival date & time: 03/13/17  [place]     History   Chief Complaint Chief Complaint  Patient presents with  . Back Pain    HPI Robin Fox is a 41 y.o. female.   Patient presents with her husband with complaints of right sided low back pain which worsened this morning. She states she has had this off and on for months but today seems worse. No known injury or exacerbating factor known. It is worse with laying on it, lifting, and back extension with ambulation. States she feels she may have mild increase in urination, without urgency or pain however. Pain does not radiate, denies numbness or tingling to her lower extremities, denies fevers or chills. Took 800mg  of ibuprofen this morning which minimally helped. Rates pain 9/10.       Past Medical History:  Diagnosis Date  . Anemia    hx   . Gestational diabetes    Neg 2 hr test after preg  . Headache(784.0)   . Herpes genitalia    2013 - 1st outbreak  . Hypertension    2008  . Pre-eclampsia in third trimester   . Seasonal allergies   . Shortness of breath    w exertion  . Trichomonas   . Twins     Patient Active Problem List   Diagnosis Date Noted  . Tobacco use 08/01/2016  . Routine postpartum follow-up 05/22/2012  . Mild or unspecified pre-eclampsia, with delivery 04/07/2012  . Abnormal maternal glucose tolerance, antepartum 03/27/2012  . History of IUGR (intrauterine growth retardation) and stillbirth, currently pregnant 02/12/2012  . History of stillbirth 12/12/2011  . Previous cesarean delivery, antepartum condition or complication 58/01/9832  . Obesity (BMI 30-39.9) 11/21/2011  . Hx of herpes genitalis 11/21/2011  . Hypertension in pregnancy, essential, antepartum 11/09/2011  . Essential hypertension 11/09/2011  . AMA (advanced maternal age) multigravida 35+ 11/09/2011    Past Surgical History:  Procedure Laterality Date  . CESAREAN SECTION     X 2  . CESAREAN SECTION  04/21/2012   Procedure: CESAREAN SECTION;  Surgeon: Woodroe Mode, MD;  Location: [facility];  Service: Obstetrics;  Laterality: N/A;    OB History    Gravida Para Term Preterm AB Living   4 3 1 2  0 4   SAB TAB Ectopic Multiple Live Births   0 0 0 2 4       Home Medications    Prior to Admission medications   Medication Sig Start Date End Date Taking? Authorizing Provider  hydrochlorothiazide (HYDRODIURIL) 25 MG tablet Take 1 tablet (25 mg total) by mouth daily. 08/24/16  Yes [name], MD  valACYclovir (VALTREX) 500 MG tablet Take 1 tablet (500 mg total) by mouth daily. 08/26/16  Yes Terrance Mass, MD  methocarbamol (ROBAXIN) 500 MG tablet Take 1 tablet (500 mg total) by mouth 2 (two) times daily. 03/13/17   Zigmund Gottron, NP    Family History Family History  Problem Relation Age of Onset  . Hypertension Mother   . Stroke Mother   . Stroke Maternal Grandmother   . Heart disease Maternal Grandfather 60  . Hypertension Brother     Social History Social History  Substance Use Topics  . Smoking status: Current Every Day Smoker    Packs/day: 0.50    Years: 17.00    Types: Cigarettes    Last attempt to quit: 10/11/2011  . Smokeless tobacco: Never Used  .  Alcohol use 0.0 oz/week     Comment: socially     Allergies   Penicillins   Review of Systems Review of Systems  Constitutional: Negative.   Respiratory: Negative.   Gastrointestinal: Negative.   Musculoskeletal: Positive for back pain.  Skin: Negative.   Neurological: Negative.      Physical Exam Triage Vital Signs ED Triage Vitals  Enc Vitals Group     BP 03/13/17 1951 133/86     Pulse Rate 03/13/17 1951 86     Resp 03/13/17 1951 14     Temp 03/13/17 1951 98.3 F (36.8 C)     Temp Source 03/13/17 1951 Oral     SpO2 03/13/17 1951 98 %     Weight --      Height --      Head Circumference --      Peak Flow --      Pain Score 03/13/17 1952 9     Pain Loc --       Pain Edu? --      Excl. in [place]? --    No data found.   Updated Vital Signs BP 133/86   Pulse 86   Temp 98.3 F (36.8 C) (Oral)   Resp 14   LMP 03/06/2017   SpO2 98%   Visual Acuity Right Eye Distance:   Left Eye Distance:   Bilateral Distance:    Right Eye Near:   Left Eye Near:    Bilateral Near:     Physical Exam  Constitutional: She is oriented to person, place, and time. She appears well-developed and well-nourished.  Neck: Normal range of motion.  Cardiovascular: Normal rate and regular rhythm.   Pulmonary/Chest: Effort normal and breath sounds normal.  Musculoskeletal:       Lumbar back: She exhibits decreased range of motion, tenderness, pain and spasm.       Back:  Pain reproducible to palpation of right lower back; increased pain with extension and flexion of hips; lower extremity strength equal 5/5; sensation and reflexes intact  Neurological: She is alert and oriented to person, place, and time. She displays normal reflexes. No sensory deficit. She exhibits normal muscle tone. Coordination normal.     UC Treatments / Results  Labs (all labs ordered are listed, but only abnormal results are displayed) Labs Reviewed - No data to display  EKG  EKG Interpretation None       Radiology No results found.  Procedures Procedures (including critical care time)  Medications Ordered in UC Medications - No data to display   Initial Impression / Assessment and Plan / UC Course  I have reviewed the triage vital signs and the nursing notes.  Pertinent labs & imaging results that were available during my care of the patient were reviewed by me and considered in my medical decision making (see chart for details).     History and exam consistent with muscular strain of right lower back. Discussed light activity, ibuprofen, use of robaxin as needed; limiting lifting of heavy objects, heat/ice, back exercises. Recommended follow up for recheck and possible  referral to physical therapy if indicated at that time. Patient agreeable to plan and verbalized understanding of instructions. Ambulatory out of clinic.   Final Clinical Impressions(s) / UC Diagnoses   Final diagnoses:  Strain of lumbar region, initial encounter    New Prescriptions New Prescriptions   METHOCARBAMOL (ROBAXIN) 500 MG TABLET    Take 1 tablet (500 mg total) by mouth 2 (two)  times daily.     Controlled Substance Prescriptions [place] Controlled Substance Registry consulted? Not Applicable   Zigmund Gottron, NP 03/13/17 2021

## 2017-09-12 ENCOUNTER — Ambulatory Visit: Payer: BLUE CROSS/BLUE SHIELD | Admitting: Family Medicine

## 2017-09-12 ENCOUNTER — Encounter: Payer: Self-pay | Admitting: Family Medicine

## 2017-09-12 VITALS — BP 134/82 | HR 93 | Temp 98.5°F | Resp 17 | Ht 67.5 in | Wt 242.0 lb

## 2017-09-12 DIAGNOSIS — I10 Essential (primary) hypertension: Secondary | ICD-10-CM

## 2017-09-12 DIAGNOSIS — R12 Heartburn: Secondary | ICD-10-CM

## 2017-09-12 DIAGNOSIS — Z131 Encounter for screening for diabetes mellitus: Secondary | ICD-10-CM | POA: Diagnosis not present

## 2017-09-12 DIAGNOSIS — Z13220 Encounter for screening for lipoid disorders: Secondary | ICD-10-CM

## 2017-09-12 DIAGNOSIS — Z720 Tobacco use: Secondary | ICD-10-CM | POA: Diagnosis not present

## 2017-09-12 MED ORDER — HYDROCHLOROTHIAZIDE 25 MG PO TABS: 25.0000 mg | ORAL_TABLET | Freq: Every day | ORAL | 1 refills | Status: DC

## 2017-09-12 NOTE — Progress Notes (Signed)
Subjective:  By signing my name below, I, Robin Fox, attest that this documentation has been prepared under the direction and in the presence of [name], MD Electronically Signed: Ladene Artist, ED Scribe 09/12/2017 at 11:49 AM.   Patient ID: Robin Fox, female    DOB: 1976/03/02, 42 y.o.   MRN: [id number]  Chief Complaint  Patient presents with  . Medication Refill    HTCZ   HPI Robin Fox is a 42 y.o. female who presents to Primary Care at [facility] for f/u of HTN. Last seen in 07/2016 for acute issue. BP at that time reportedly was 130s/80s at home. Increased HCTZ to 25 mg qd. Recommended recheck in 6 wks. BP was 133/86 at Urgent Care in Oct. No recent electrolytes. - Pt ran out of BP medication ~ 3 wks ago. Denies cp, lightheadedness dizziness, HAs, blurry vision, any other symptoms. She has not been exercising recently. She is fasting at this visit. BP Readings from Last 3 Encounters:  09/12/17 134/82  03/13/17 133/86  09/11/16 (!) 138/96   Lab Results  Component Value Date   CREATININE 0.85 09/11/2016   Indigestion Pt reports that she has been having a lot of indigestion lately. She has been taking Zantac and TUMS bid. Denies abdominal pain, blood in stools. She is drinking at least 1 cup of caffeine daily and smoking with her last attempt to quit being 1 month ago.  Patient Active Problem List   Diagnosis Date Noted  . Tobacco use 08/01/2016  . Routine postpartum follow-up 05/22/2012  . Mild or unspecified pre-eclampsia, with delivery 04/07/2012  . Abnormal maternal glucose tolerance, antepartum 03/27/2012  . History of IUGR (intrauterine growth retardation) and stillbirth, currently pregnant 02/12/2012  . History of stillbirth 12/12/2011  . Previous cesarean delivery, antepartum condition or complication 50/27/7412  . Obesity (BMI 30-39.9) 11/21/2011  . Hx of herpes genitalis 11/21/2011  . Hypertension in pregnancy, essential,  antepartum 11/09/2011  . Essential hypertension 11/09/2011  . AMA (advanced maternal age) multigravida 35+ 11/09/2011   Past Medical History:  Diagnosis Date  . Anemia    hx   . Gestational diabetes    Neg 2 hr test after preg  . Headache(784.0)   . Herpes genitalia    2013 - 1st outbreak  . Hypertension    2008  . Pre-eclampsia in third trimester   . Seasonal allergies   . Shortness of breath    w exertion  . Trichomonas   . Twins    Past Surgical History:  Procedure Laterality Date  . CESAREAN SECTION     X 2  . CESAREAN SECTION  04/21/2012   Procedure: CESAREAN SECTION;  Surgeon: Woodroe Mode, MD;  Location: [facility];  Service: Obstetrics;  Laterality: N/A;   Allergies  Allergen Reactions  . Penicillins Hives, Shortness Of Breath and Swelling    Has patient had a PCN reaction causing immediate rash, facial/tongue/throat swelling, SOB or lightheadedness with hypotension: no Has patient had a PCN reaction causing severe rash involving mucus membranes or skin necrosis:no Has patient had a PCN reaction that required hospitalization no Has patient had a PCN reaction occurring within the last 10 years: {Yes If all of the above answers are "NO", then may proceed with Cephalosporin use.   Prior to Admission medications   Medication Sig Start Date End Date Taking? Authorizing Provider  hydrochlorothiazide (HYDRODIURIL) 25 MG tablet Take 1 tablet (25 mg total) by mouth daily. 08/24/16   Carlota Raspberry,  Ranell Patrick, MD  methocarbamol (ROBAXIN) 500 MG tablet Take 1 tablet (500 mg total) by mouth 2 (two) times daily. 03/13/17   Zigmund Gottron, NP  valACYclovir (VALTREX) 500 MG tablet Take 1 tablet (500 mg total) by mouth daily. 08/26/16   Terrance Mass, MD  methyldopa (ALDOMET) 500 MG tablet Take 2 tablets (1,000 mg total) by mouth 2 (two) times daily. Patient not taking: Reported on 03/24/2015 04/13/12 03/24/15  Guss Bunde, MD  norethindrone (MICRONOR,CAMILA,ERRIN) 0.35 MG tablet  Take 1 tablet (0.35 mg total) by mouth daily. Patient not taking: Reported on 03/24/2015 05/22/12 03/24/15  Woodroe Mode, MD   Social History   Socioeconomic History  . Marital status: Married    Spouse name: Not on file  . Number of children: Not on file  . Years of education: Not on file  . Highest education level: Not on file  Occupational History  . Not on file  Social Needs  . Financial resource strain: Not on file  . Food insecurity:    Worry: Not on file    Inability: Not on file  . Transportation needs:    Medical: Not on file    Non-medical: Not on file  Tobacco Use  . Smoking status: Current Every Day Smoker    Packs/day: 0.50    Years: 17.00    Pack years: 8.50    Types: Cigarettes    Last attempt to quit: 10/11/2011    Years since quitting: 5.9  . Smokeless tobacco: Never Used  Substance and Sexual Activity  . Alcohol use: Yes    Alcohol/week: 0.0 oz    Comment: socially  . Drug use: No  . Sexual activity: Yes    Birth control/protection: Other-see comments, None    Comment: Partner had vasectomy  Lifestyle  . Physical activity:    Days per week: Not on file    Minutes per session: Not on file  . Stress: Not on file  Relationships  . Social connections:    Talks on phone: Not on file    Gets together: Not on file    Attends religious service: Not on file    Active member of club or organization: Not on file    Attends meetings of clubs or organizations: Not on file    Relationship status: Not on file  . Intimate partner violence:    Fear of current or ex partner: Not on file    Emotionally abused: Not on file    Physically abused: Not on file    Forced sexual activity: Not on file  Other Topics Concern  . Not on file  Social History Narrative  . Not on file   Review of Systems  Constitutional: Negative for fatigue and unexpected weight change.  Respiratory: Negative for chest tightness and shortness of breath.   Cardiovascular: Negative  for chest pain, palpitations and leg swelling.  Gastrointestinal: Negative for abdominal pain and blood in stool.  Neurological: Negative for dizziness, syncope, light-headedness and headaches.      Objective:   Physical Exam  Constitutional: She is oriented to person, place, and time. She appears well-developed and well-nourished.  HENT:  Head: Normocephalic and atraumatic.  Eyes: Pupils are equal, round, and reactive to light. Conjunctivae and EOM are normal.  Neck: Carotid bruit is not present.  Cardiovascular: Normal rate, regular rhythm, normal heart sounds and intact distal pulses.  Pulmonary/Chest: Effort normal and breath sounds normal.  Abdominal: Soft. She exhibits no pulsatile  midline mass. There is no tenderness.  Neurological: She is alert and oriented to person, place, and time.  Skin: Skin is warm and dry.  Psychiatric: She has a normal mood and affect. Her behavior is normal.  Vitals reviewed.  Vitals:   09/12/17 1141  BP: 134/82  Pulse: 93  Resp: 17  Temp: 98.5 F (36.9 C)  TempSrc: Oral  SpO2: 98%  Weight: 242 lb (109.8 kg)  Height: 5' 7.5" (1.715 m)      Assessment & Plan:    Robin Fox is a 42 y.o. female Essential hypertension - Plan: Comprehensive metabolic panel, hydrochlorothiazide (HYDRODIURIL) 25 MG tablet  -Borderline, off meds past few days.  Check electrolytes as previous evaluation with abnormalities.  Importance of follow-up on medication stressed.  Continue HCTZ 25 mg daily, recheck next 1 month.  Tobacco abuse  -Cessation discussed including importance of cessation with coexisting heartburn and hypertension.  Handout provided, option Chantix and potential side effects/risks were discussed.  Heartburn  -Over-the-counter Zantac or Pepcid,, list of food triggers given on handout, recheck 1 month.  Screening for hyperlipidemia - Plan: Lipid panel  Screening for diabetes mellitus  -Check CMP.  Meds ordered this encounter    Medications  . hydrochlorothiazide (HYDRODIURIL) 25 MG tablet    Sig: Take 1 tablet (25 mg total) by mouth daily.    Dispense:  90 tablet    Refill:  1   Patient Instructions   Restart hydrochlorothiazide once per day.  See information below on foods to avoid with heartburn, as well as info on quitting smoking.   Exercise low intensity most days per week with an ultimate goal of 150 minutes/week.  Recheck within the next 1 month to recheck blood pressure and discuss heartburn further.  We can also discuss weight further at that time as well.  Thank you for coming in today.  [facility] offers smoking cessation clinics. Registration is required. To register call [phone] or register online at [email or web address]  Steps to Quit Smoking Smoking tobacco can be harmful to your health and can affect almost every organ in your body. Smoking puts you, and those around you, at risk for developing many serious chronic diseases. Quitting smoking is difficult, but it is one of the best things that you can do for your health. It is never too late to quit. What are the benefits of quitting smoking? When you quit smoking, you lower your risk of developing serious diseases and conditions, such as:  Lung cancer or lung disease, such as COPD.  Heart disease.  Stroke.  Heart attack.  Infertility.  Osteoporosis and bone fractures.  Additionally, symptoms such as coughing, wheezing, and shortness of breath may get better when you quit. You may also find that you get sick less often because your body is stronger at fighting off colds and infections. If you are pregnant, quitting smoking can help to reduce your chances of having a baby of low birth weight. How do I get ready to quit? When you decide to quit smoking, create a plan to make sure that you are successful. Before you quit:  Pick a date to quit. Set a date within the next two weeks to give you time to prepare.  Write down the reasons why you  are quitting. Keep this list in places where you will see it often, such as on your bathroom mirror or in your car or wallet.  Identify the people, places, things, and activities that make you want  to smoke (triggers) and avoid them. Make sure to take these actions: ? Throw away all cigarettes at home, at work, and in your car. ? Throw away smoking accessories, such as Scientist, research (medical). ? Clean your car and make sure to empty the ashtray. ? Clean your home, including curtains and carpets.  Tell your family, friends, and coworkers that you are quitting. Support from your loved ones can make quitting easier.  Talk with your health care provider about your options for quitting smoking.  Find out what treatment options are covered by your health insurance.  What strategies can I use to quit smoking? Talk with your healthcare provider about different strategies to quit smoking. Some strategies include:  Quitting smoking altogether instead of gradually lessening how much you smoke over a period of time. Research shows that quitting "cold [place]" is more successful than gradually quitting.  Attending in-person counseling to help you build problem-solving skills. You are more likely to have success in quitting if you attend several counseling sessions. Even short sessions of 10 minutes can be effective.  Finding resources and support systems that can help you to quit smoking and remain smoke-free after you quit. These resources are most helpful when you use them often. They can include: ? Online chats with a Social worker. ? Telephone quitlines. ? Careers information officer. ? Support groups or group counseling. ? Text messaging programs. ? Mobile phone applications.  Taking medicines to help you quit smoking. (If you are pregnant or breastfeeding, talk with your health care provider first.) Some medicines contain nicotine and some do not. Both types of medicines help with cravings, but the  medicines that include nicotine help to relieve withdrawal symptoms. Your health care provider may recommend: ? Nicotine patches, gum, or lozenges. ? Nicotine inhalers or sprays. ? Non-nicotine medicine that is taken by mouth.  Talk with your health care provider about combining strategies, such as taking medicines while you are also receiving in-person counseling. Using these two strategies together makes you more likely to succeed in quitting than if you used either strategy on its own. If you are pregnant or breastfeeding, talk with your health care provider about finding counseling or other support strategies to quit smoking. Do not take medicine to help you quit smoking unless told to do so by your health care provider. What things can I do to make it easier to quit? Quitting smoking might feel overwhelming at first, but there is a lot that you can do to make it easier. Take these important actions:  Reach out to your family and friends and ask that they support and encourage you during this time. Call telephone quitlines, reach out to support groups, or work with a counselor for support.  Ask people who smoke to avoid smoking around you.  Avoid places that trigger you to smoke, such as bars, parties, or smoke-break areas at work.  Spend time around people who do not smoke.  Lessen stress in your life, because stress can be a smoking trigger for some people. To lessen stress, try: ? Exercising regularly. ? Deep-breathing exercises. ? Yoga. ? Meditating. ? Performing a body scan. This involves closing your eyes, scanning your body from head to toe, and noticing which parts of your body are particularly tense. Purposefully relax the muscles in those areas.  Download or purchase mobile phone or tablet apps (applications) that can help you stick to your quit plan by providing reminders, tips, and encouragement. There are many free apps,  such as QuitGuide from the [employer] Office manager for Disease  Control and Prevention). You can find other support for quitting smoking (smoking cessation) through smokefree.gov and other websites.  How will I feel when I quit smoking? Within the first 24 hours of quitting smoking, you may start to feel some withdrawal symptoms. These symptoms are usually most noticeable 2-3 days after quitting, but they usually do not last beyond 2-3 weeks. Changes or symptoms that you might experience include:  Mood swings.  Restlessness, anxiety, or irritation.  Difficulty concentrating.  Dizziness.  Strong cravings for sugary foods in addition to nicotine.  Mild weight gain.  Constipation.  Nausea.  Coughing or a sore throat.  Changes in how your medicines work in your body.  A depressed mood.  Difficulty sleeping (insomnia).  After the first 2-3 weeks of quitting, you may start to notice more positive results, such as:  Improved sense of smell and taste.  Decreased coughing and sore throat.  Slower heart rate.  Lower blood pressure.  Clearer skin.  The ability to breathe more easily.  Fewer sick days.  Quitting smoking is very challenging for most people. Do not get discouraged if you are not successful the first time. Some people need to make many attempts to quit before they achieve long-term success. Do your best to stick to your quit plan, and talk with your health care provider if you have any questions or concerns. This information is not intended to replace advice given to you by your health care provider. Make sure you discuss any questions you have with your health care provider. Document Released: 05/07/2001 Document Revised: 01/09/2016 Document Reviewed: 09/27/2014 Elsevier Interactive Patient Education  2018 [place] for Gastroesophageal Reflux Disease, Adult When you have gastroesophageal reflux disease (GERD), the foods you eat and your eating habits are very important. Choosing the right foods can help  ease your discomfort. What guidelines do I need to follow?  Choose fruits, vegetables, whole grains, and low-fat dairy products.  Choose low-fat meat, fish, and poultry.  Limit fats such as oils, salad dressings, butter, nuts, and avocado.  Keep a food diary. This helps you identify foods that cause symptoms.  Avoid foods that cause symptoms. These may be different for everyone.  Eat small meals often instead of 3 large meals a day.  Eat your meals slowly, in a place where you are relaxed.  Limit fried foods.  Cook foods using methods other than frying.  Avoid drinking alcohol.  Avoid drinking large amounts of liquids with your meals.  Avoid bending over or lying down until 2-3 hours after eating. What foods are not recommended? These are some foods and drinks that may make your symptoms worse: Vegetables Tomatoes. Tomato juice. Tomato and spaghetti sauce. Chili peppers. Onion and garlic. Horseradish. Fruits Oranges, grapefruit, and lemon (fruit and juice). Meats High-fat meats, fish, and poultry. This includes hot dogs, ribs, ham, sausage, salami, and bacon. Dairy Whole milk and chocolate milk. Sour cream. Cream. Butter. Ice cream. Cream cheese. Drinks Coffee and tea. Bubbly (carbonated) drinks or energy drinks. Condiments Hot sauce. Barbecue sauce. Sweets/Desserts Chocolate and cocoa. Donuts. Peppermint and spearmint. Fats and Oils High-fat foods. This includes [place] fries and potato chips. Other Vinegar. Strong spices. This includes black pepper, white pepper, red pepper, cayenne, curry powder, cloves, ginger, and chili powder. The items listed above may not be a complete list of foods and drinks to avoid. Contact your dietitian for more information. This information  is not intended to replace advice given to you by your health care provider. Make sure you discuss any questions you have with your health care provider. Document Released: 11/12/2011 Document Revised:  10/19/2015 Document Reviewed: 03/17/2013 Elsevier Interactive Patient Education  2017 [employer].     IF you received an x-ray today, you will receive an invoice from [facility]. Please contact [facility] at [phone] with questions or concerns regarding your invoice.   IF you received labwork today, you will receive an invoice from [facility]. Please contact [facility] at [phone] with questions or concerns regarding your invoice.   Our billing staff will not be able to assist you with questions regarding bills from these companies.  You will be contacted with the lab results as soon as they are available. The fastest way to get your results is to activate your My Chart account. Instructions are located on the last page of this paperwork. If you have not heard from [place] regarding the results in 2 weeks, please contact this office.       I personally performed the services described in this documentation, which was scribed in my presence. The recorded information has been reviewed and considered for accuracy and completeness, addended by me as needed, and agree with information above.  Signed,   Merri Ray, MD [facility].  09/12/17 12:07 PM

## 2017-09-12 NOTE — Patient Instructions (Addendum)
Restart hydrochlorothiazide once per day.  See information below on foods to avoid with heartburn, as well as info on quitting smoking.   Exercise low intensity most days per week with an ultimate goal of 150 minutes/week.  Recheck within the next 1 month to recheck blood pressure and discuss heartburn further.  We can also discuss weight further at that time as well.  Thank you for coming in today.  [place] offers smoking cessation clinics. Registration is required. To register call [phone] or register online at [email or web address]  Steps to Quit Smoking Smoking tobacco can be harmful to your health and can affect almost every organ in your body. Smoking puts you, and those around you, at risk for developing many serious chronic diseases. Quitting smoking is difficult, but it is one of the best things that you can do for your health. It is never too late to quit. What are the benefits of quitting smoking? When you quit smoking, you lower your risk of developing serious diseases and conditions, such as:  Lung cancer or lung disease, such as COPD.  Heart disease.  Stroke.  Heart attack.  Infertility.  Osteoporosis and bone fractures.  Additionally, symptoms such as coughing, wheezing, and shortness of breath may get better when you quit. You may also find that you get sick less often because your body is stronger at fighting off colds and infections. If you are pregnant, quitting smoking can help to reduce your chances of having a baby of low birth weight. How do I get ready to quit? When you decide to quit smoking, create a plan to make sure that you are successful. Before you quit:  Pick a date to quit. Set a date within the next two weeks to give you time to prepare.  Write down the reasons why you are quitting. Keep this list in places where you will see it often, such as on your bathroom mirror or in your car or wallet.  Identify the people, places, things, and activities that  make you want to smoke (triggers) and avoid them. Make sure to take these actions: ? Throw away all cigarettes at home, at work, and in your car. ? Throw away smoking accessories, such as Scientist, research (medical). ? Clean your car and make sure to empty the ashtray. ? Clean your home, including curtains and carpets.  Tell your family, friends, and coworkers that you are quitting. Support from your loved ones can make quitting easier.  Talk with your health care provider about your options for quitting smoking.  Find out what treatment options are covered by your health insurance.  What strategies can I use to quit smoking? Talk with your healthcare provider about different strategies to quit smoking. Some strategies include:  Quitting smoking altogether instead of gradually lessening how much you smoke over a period of time. Research shows that quitting "cold [place]" is more successful than gradually quitting.  Attending in-person counseling to help you build problem-solving skills. You are more likely to have success in quitting if you attend several counseling sessions. Even short sessions of 10 minutes can be effective.  Finding resources and support systems that can help you to quit smoking and remain smoke-free after you quit. These resources are most helpful when you use them often. They can include: ? Online chats with a Social worker. ? Telephone quitlines. ? Careers information officer. ? Support groups or group counseling. ? Text messaging programs. ? Mobile phone applications.  Taking medicines to help you  quit smoking. (If you are pregnant or breastfeeding, talk with your health care provider first.) Some medicines contain nicotine and some do not. Both types of medicines help with cravings, but the medicines that include nicotine help to relieve withdrawal symptoms. Your health care provider may recommend: ? Nicotine patches, gum, or lozenges. ? Nicotine inhalers or  sprays. ? Non-nicotine medicine that is taken by mouth.  Talk with your health care provider about combining strategies, such as taking medicines while you are also receiving in-person counseling. Using these two strategies together makes you more likely to succeed in quitting than if you used either strategy on its own. If you are pregnant or breastfeeding, talk with your health care provider about finding counseling or other support strategies to quit smoking. Do not take medicine to help you quit smoking unless told to do so by your health care provider. What things can I do to make it easier to quit? Quitting smoking might feel overwhelming at first, but there is a lot that you can do to make it easier. Take these important actions:  Reach out to your family and friends and ask that they support and encourage you during this time. Call telephone quitlines, reach out to support groups, or work with a counselor for support.  Ask people who smoke to avoid smoking around you.  Avoid places that trigger you to smoke, such as bars, parties, or smoke-break areas at work.  Spend time around people who do not smoke.  Lessen stress in your life, because stress can be a smoking trigger for some people. To lessen stress, try: ? Exercising regularly. ? Deep-breathing exercises. ? Yoga. ? Meditating. ? Performing a body scan. This involves closing your eyes, scanning your body from head to toe, and noticing which parts of your body are particularly tense. Purposefully relax the muscles in those areas.  Download or purchase mobile phone or tablet apps (applications) that can help you stick to your quit plan by providing reminders, tips, and encouragement. There are many free apps, such as QuitGuide from the [employer] Office manager for Disease Control and Prevention). You can find other support for quitting smoking (smoking cessation) through smokefree.gov and other websites.  How will I feel when I quit  smoking? Within the first 24 hours of quitting smoking, you may start to feel some withdrawal symptoms. These symptoms are usually most noticeable 2-3 days after quitting, but they usually do not last beyond 2-3 weeks. Changes or symptoms that you might experience include:  Mood swings.  Restlessness, anxiety, or irritation.  Difficulty concentrating.  Dizziness.  Strong cravings for sugary foods in addition to nicotine.  Mild weight gain.  Constipation.  Nausea.  Coughing or a sore throat.  Changes in how your medicines work in your body.  A depressed mood.  Difficulty sleeping (insomnia).  After the first 2-3 weeks of quitting, you may start to notice more positive results, such as:  Improved sense of smell and taste.  Decreased coughing and sore throat.  Slower heart rate.  Lower blood pressure.  Clearer skin.  The ability to breathe more easily.  Fewer sick days.  Quitting smoking is very challenging for most people. Do not get discouraged if you are not successful the first time. Some people need to make many attempts to quit before they achieve long-term success. Do your best to stick to your quit plan, and talk with your health care provider if you have any questions or concerns. This information is not  intended to replace advice given to you by your health care provider. Make sure you discuss any questions you have with your health care provider. Document Released: 05/07/2001 Document Revised: 01/09/2016 Document Reviewed: 09/27/2014 Elsevier Interactive Patient Education  2018 [place] for Gastroesophageal Reflux Disease, Adult When you have gastroesophageal reflux disease (GERD), the foods you eat and your eating habits are very important. Choosing the right foods can help ease your discomfort. What guidelines do I need to follow?  Choose fruits, vegetables, whole grains, and low-fat dairy products.  Choose low-fat meat, fish, and  poultry.  Limit fats such as oils, salad dressings, butter, nuts, and avocado.  Keep a food diary. This helps you identify foods that cause symptoms.  Avoid foods that cause symptoms. These may be different for everyone.  Eat small meals often instead of 3 large meals a day.  Eat your meals slowly, in a place where you are relaxed.  Limit fried foods.  Cook foods using methods other than frying.  Avoid drinking alcohol.  Avoid drinking large amounts of liquids with your meals.  Avoid bending over or lying down until 2-3 hours after eating. What foods are not recommended? These are some foods and drinks that may make your symptoms worse: Vegetables Tomatoes. Tomato juice. Tomato and spaghetti sauce. Chili peppers. Onion and garlic. Horseradish. Fruits Oranges, grapefruit, and lemon (fruit and juice). Meats High-fat meats, fish, and poultry. This includes hot dogs, ribs, ham, sausage, salami, and bacon. Dairy Whole milk and chocolate milk. Sour cream. Cream. Butter. Ice cream. Cream cheese. Drinks Coffee and tea. Bubbly (carbonated) drinks or energy drinks. Condiments Hot sauce. Barbecue sauce. Sweets/Desserts Chocolate and cocoa. Donuts. Peppermint and spearmint. Fats and Oils High-fat foods. This includes [place] fries and potato chips. Other Vinegar. Strong spices. This includes black pepper, white pepper, red pepper, cayenne, curry powder, cloves, ginger, and chili powder. The items listed above may not be a complete list of foods and drinks to avoid. Contact your dietitian for more information. This information is not intended to replace advice given to you by your health care provider. Make sure you discuss any questions you have with your health care provider. Document Released: 11/12/2011 Document Revised: 10/19/2015 Document Reviewed: 03/17/2013 Elsevier Interactive Patient Education  2017 [employer].     IF you received an x-ray today, you will receive an  invoice from [facility]. Please contact [facility] at [phone] with questions or concerns regarding your invoice.   IF you received labwork today, you will receive an invoice from [employer]. Please contact [facility] at [phone] with questions or concerns regarding your invoice.   Our billing staff will not be able to assist you with questions regarding bills from these companies.  You will be contacted with the lab results as soon as they are available. The fastest way to get your results is to activate your My Chart account. Instructions are located on the last page of this paperwork. If you have not heard from [place] regarding the results in 2 weeks, please contact this office.

## 2017-09-13 LAB — LIPID PANEL
CHOL/HDL RATIO: 3.6 ratio (ref 0.0–4.4)
Cholesterol, Total: 156 mg/dL (ref 100–199)
HDL: 43 mg/dL (ref >39)
LDL CALC: 88 mg/dL (ref 0–99)
Triglycerides: 125 mg/dL (ref 0–149)
VLDL Cholesterol Cal: 25 mg/dL (ref 5–40)

## 2017-09-13 LAB — COMPREHENSIVE METABOLIC PANEL
A/G RATIO: 1.4 (ref 1.2–2.2)
ALBUMIN: 4.1 g/dL (ref 3.5–5.5)
ALT: 7 IU/L (ref 0–32)
AST: 16 IU/L (ref 0–40)
Alkaline Phosphatase: 52 IU/L (ref 39–117)
BUN / CREAT RATIO: 8 — AB (ref 9–23)
BUN: 7 mg/dL (ref 6–24)
Bilirubin Total: 0.3 mg/dL (ref 0.0–1.2)
CALCIUM: 8.9 mg/dL (ref 8.7–10.2)
CO2: 21 mmol/L (ref 20–29)
CREATININE: 0.88 mg/dL (ref 0.57–1.00)
Chloride: 103 mmol/L (ref 96–106)
GFR calc Af Amer: 95 mL/min/{1.73_m2} (ref >59)
GFR, EST NON AFRICAN AMERICAN: 82 mL/min/{1.73_m2} (ref >59)
GLOBULIN, TOTAL: 3 g/dL (ref 1.5–4.5)
Glucose: 88 mg/dL (ref 65–99)
POTASSIUM: 4.3 mmol/L (ref 3.5–5.2)
SODIUM: 135 mmol/L (ref 134–144)
Total Protein: 7.1 g/dL (ref 6.0–8.5)

## 2017-09-16 ENCOUNTER — Encounter: Payer: Self-pay | Admitting: Gynecology

## 2017-09-16 ENCOUNTER — Ambulatory Visit: Payer: BLUE CROSS/BLUE SHIELD | Admitting: Gynecology

## 2017-09-16 VITALS — BP 120/76

## 2017-09-16 DIAGNOSIS — N924 Excessive bleeding in the premenopausal period: Secondary | ICD-10-CM | POA: Diagnosis not present

## 2017-09-16 DIAGNOSIS — N926 Irregular menstruation, unspecified: Secondary | ICD-10-CM

## 2017-09-16 LAB — CBC WITH DIFFERENTIAL/PLATELET
BASOS ABS: 16 {cells}/uL (ref 0–200)
Basophils Relative: 0.2 %
EOS ABS: 197 {cells}/uL (ref 15–500)
EOS PCT: 2.4 %
HCT: 34.1 % — ABNORMAL LOW (ref 35.0–45.0)
HEMOGLOBIN: 9.3 g/dL — AB (ref 11.7–15.5)
Lymphs Abs: 1943 cells/uL (ref 850–3900)
MCH: 16.1 pg — AB (ref 27.0–33.0)
MCHC: 27.3 g/dL — AB (ref 32.0–36.0)
MCV: 59.1 fL — ABNORMAL LOW (ref 80.0–100.0)
MONOS PCT: 5.1 %
NEUTROS PCT: 68.6 %
Neutro Abs: 5625 cells/uL (ref 1500–7800)
PLATELETS: 471 10*3/uL — AB (ref 140–400)
RBC: 5.77 10*6/uL — ABNORMAL HIGH (ref 3.80–5.10)
RDW: 21.6 % — AB (ref 11.0–15.0)
TOTAL LYMPHOCYTE: 23.7 %
WBC mixed population: 418 cells/uL (ref 200–950)
WBC: 8.2 10*3/uL (ref 3.8–10.8)

## 2017-09-16 LAB — TSH: TSH: 1.47 m[IU]/L

## 2017-09-16 LAB — CBC MORPHOLOGY

## 2017-09-16 MED ORDER — MEGESTROL ACETATE 20 MG PO TABS: 20.0000 mg | ORAL_TABLET | Freq: Every day | ORAL | 0 refills | Status: DC

## 2017-09-16 NOTE — Progress Notes (Signed)
    Robin Fox 12/11/1975 [id number]        42 y.o.  [id number] presents complaining of irregular menses.  Notes that her menses usually are every 3-4 weeks.  Did well until approximately 2 weeks ago when she had her regular on-time menses but it was heavier and lasted longer.  She then started bleeding yesterday heavily with passage of clots.  Also with some cramping.  No history of significant abnormal bleeding previously.  No history of GYN pathology such as leiomyoma.  Vasectomy birth control.  Does note her mother went through menopause at age 20.  No skin, hair or weight changes.  No breast tenderness or nipple discharge.  Past medical history,surgical history, problem list, medications, allergies, family history and social history were all reviewed and documented in the EPIC chart.  Directed ROS with pertinent positives and negatives documented in the history of present illness/assessment and plan.  Exam: [name] assistant Vitals:   09/16/17 1143  BP: 120/76   General appearance:  Normal Abdomen soft nontender without masses guarding rebound Pelvic external BUS vagina with menses type flow.  Cervix grossly normal.  Uterus generous in size midline mobile nontender.  Adnexa without masses or tenderness.  Assessment/Plan:  42 y.o. [id number] with irregular bleeding and some heavy bleeding most recently.  Reviewed differential to include hormonal abnormality such as premature menopause noting mother's history or thyroid dysfunction although denies weight loss or gain, skin or hair changes.  No breast tenderness or nipple discharge.  Ovulatory irregularity, leiomyoma giving generous uterine size on exam, endometrial abnormalities such as polyp polyp/submucous myomas.  Using vasectomy birth control.  Will check baseline CBC, hCG TSH, FSH and start with ultrasound possible sonohysterogram given enlarged uterus rule out leiomyoma or other pelvic pathology.  Patient will follow-up for  these results and then will go from there.  Did give her a prescription for Megace 20 mg to start twice daily times several days then daily times 1 week to ease her bleeding.  Greater than 50% of 15-minute office visit was spent in direct face to face counseling and coordination of care with the patient.     Anastasio Auerbach MD, 12:13 PM 09/16/2017

## 2017-09-16 NOTE — Patient Instructions (Signed)
Follow up for ultrasound as scheduled 

## 2017-09-17 LAB — HCG, SERUM, QUALITATIVE: Preg, Serum: NEGATIVE

## 2017-09-17 LAB — FOLLICLE STIMULATING HORMONE: FSH: 4.9 m[IU]/mL

## 2017-10-08 ENCOUNTER — Other Ambulatory Visit: Payer: Self-pay | Admitting: Gynecology

## 2017-10-08 DIAGNOSIS — N852 Hypertrophy of uterus: Secondary | ICD-10-CM

## 2017-10-08 DIAGNOSIS — N939 Abnormal uterine and vaginal bleeding, unspecified: Secondary | ICD-10-CM

## 2017-10-11 ENCOUNTER — Emergency Department (HOSPITAL_COMMUNITY)
Admission: EM | Admit: 2017-10-11 | Discharge: 2017-10-11 | Disposition: A | Discharge status: Home / Self Care | Payer: BLUE CROSS/BLUE SHIELD | Attending: Emergency Medicine | Admitting: Emergency Medicine

## 2017-10-11 ENCOUNTER — Encounter (HOSPITAL_COMMUNITY): Payer: Self-pay | Admitting: Emergency Medicine

## 2017-10-11 ENCOUNTER — Other Ambulatory Visit: Payer: Self-pay

## 2017-10-11 ENCOUNTER — Emergency Department (HOSPITAL_COMMUNITY): Payer: BLUE CROSS/BLUE SHIELD

## 2017-10-11 DIAGNOSIS — Z5321 Procedure and treatment not carried out due to patient leaving prior to being seen by health care provider: Secondary | ICD-10-CM | POA: Insufficient documentation

## 2017-10-11 DIAGNOSIS — R079 Chest pain, unspecified: Secondary | ICD-10-CM | POA: Diagnosis not present

## 2017-10-11 LAB — CBC
HCT: 35.3 % — ABNORMAL LOW (ref 36.0–46.0)
Hemoglobin: 9.8 g/dL — ABNORMAL LOW (ref 12.0–15.0)
MCH: 16 pg — AB (ref 26.0–34.0)
MCHC: 27.8 g/dL — AB (ref 30.0–36.0)
MCV: 57.6 fL — ABNORMAL LOW (ref 78.0–100.0)
PLATELETS: 457 10*3/uL — AB (ref 150–400)
RBC: 6.13 MIL/uL — AB (ref 3.87–5.11)
RDW: 22.2 % — ABNORMAL HIGH (ref 11.5–15.5)
WBC: 10 10*3/uL (ref 4.0–10.5)

## 2017-10-11 LAB — I-STAT TROPONIN, ED: TROPONIN I, POC: 0 ng/mL (ref 0.00–0.08)

## 2017-10-11 LAB — BASIC METABOLIC PANEL
ANION GAP: 11 (ref 5–15)
BUN: 15 mg/dL (ref 6–20)
CALCIUM: 9.9 mg/dL (ref 8.9–10.3)
CO2: 25 mmol/L (ref 22–32)
CREATININE: 0.91 mg/dL (ref 0.44–1.00)
Chloride: 101 mmol/L (ref 101–111)
Glucose, Bld: 121 mg/dL — ABNORMAL HIGH (ref 65–99)
Potassium: 3.3 mmol/L — ABNORMAL LOW (ref 3.5–5.1)
Sodium: 137 mmol/L (ref 135–145)

## 2017-10-11 LAB — I-STAT BETA HCG BLOOD, ED (MC, WL, AP ONLY): I-stat hCG, quantitative: <5 m[IU]/mL (ref <5)

## 2017-10-11 NOTE — ED Notes (Signed)
No response for vitals  

## 2017-10-11 NOTE — ED Notes (Signed)
CALLED PATIENT FOR VITALS PATIENT DID NOT ANSWER DID NOT SEE PATIENT IN THE WAITING ROOM

## 2017-10-11 NOTE — ED Triage Notes (Signed)
Reports sudden onset of central chest pain described as cramping a couple of hours ago.  Checked out by ems but came pov.  Took asa 324mg  pta.

## 2017-10-11 NOTE — ED Notes (Signed)
No answer from pt in waiting room 

## 2017-10-13 ENCOUNTER — Ambulatory Visit: Payer: BLUE CROSS/BLUE SHIELD | Admitting: Family Medicine

## 2017-10-16 ENCOUNTER — Ambulatory Visit: Payer: BLUE CROSS/BLUE SHIELD | Admitting: Family Medicine

## 2017-10-21 ENCOUNTER — Ambulatory Visit: Payer: BLUE CROSS/BLUE SHIELD

## 2017-10-21 ENCOUNTER — Ambulatory Visit (INDEPENDENT_AMBULATORY_CARE_PROVIDER_SITE_OTHER): Payer: BLUE CROSS/BLUE SHIELD | Admitting: Gynecology

## 2017-10-21 ENCOUNTER — Encounter: Payer: Self-pay | Admitting: Gynecology

## 2017-10-21 VITALS — BP 122/78

## 2017-10-21 DIAGNOSIS — N924 Excessive bleeding in the premenopausal period: Secondary | ICD-10-CM

## 2017-10-21 DIAGNOSIS — N939 Abnormal uterine and vaginal bleeding, unspecified: Secondary | ICD-10-CM | POA: Diagnosis not present

## 2017-10-21 DIAGNOSIS — N926 Irregular menstruation, unspecified: Secondary | ICD-10-CM

## 2017-10-21 DIAGNOSIS — N852 Hypertrophy of uterus: Secondary | ICD-10-CM

## 2017-10-21 DIAGNOSIS — D259 Leiomyoma of uterus, unspecified: Secondary | ICD-10-CM

## 2017-10-21 DIAGNOSIS — D649 Anemia, unspecified: Secondary | ICD-10-CM

## 2017-10-21 NOTE — Patient Instructions (Signed)
Office will call with biopsy results 

## 2017-10-21 NOTE — Progress Notes (Addendum)
    Robin Fox 1975-11-01 [id number]        42 y.o.  [id number] presents for sonohysterogram.  History of regular monthly menses through April when she started to have irregular bleeding on and off.  I saw her in the end of April and she was treated with a course of medroxyprogesterone.  Exam showed the uterus to be generous in size but otherwise unremarkable.  Blood work at that time showed a hemoglobin in the 9 range with a normal FSH and TSH and negative hCG.  Using vasectomy birth control.  Has continued to bleed on and off since then.  Is taking iron every other day due to constipation preventing her to take it every day.  Past medical history,surgical history, problem list, medications, allergies, family history and social history were all reviewed and documented in the EPIC chart.  Directed ROS with pertinent positives and negatives documented in the history of present illness/assessment and plan.  Exam: [place] assistant Vitals:   10/21/17 1622  BP: 122/78   General appearance:  Normal Abdomen soft nontender without masses guarding rebound Pelvic external BUS vagina without bleeding.  Cervix normal.  Uterus generous in size midline mobile nontender.  Adnexa without masses or tenderness.  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  Endometrium tri layered at 10 mm.  Right and left ovaries normal with physiologic changes.  Cul-de-sac negative.  Sonohysterogram performed, sterile technique, single-tooth tenaculum anterior lip stabilization with disposable dilator needed to allow for catheter placement.  Good distention with 38 x 10 x 14 mm defect floating within the cavity noted.  Initially felt to be blood clot but could not get it to move with repeated flushing.  No Doppler flow to the area noted.  Subsequently endometrial sample taken noting clot within the sample.  Repeat ultrasound of the endometrial echo  not beneficial.  Assessment/Plan:  42 y.o. [name] with new onset of irregular bleeding.  Uterus generous in size but no significant leiomyoma.  Endometrium shows area questionable polyp versus clot.  Unable to flush it from the cavity with initial sonohysterogram.  There was a clot in the endometrial sample specimen.  I did not want to repeat the sonohysterogram at this time due the patient's discomfort and a repeat look at the endometrial echo today was not helpful.  Recommended we await pathology.  Possibilities to include expectant management, proceeding with hysteroscopy D&C or other treatment regimens discussed such as hormonal suppression.  Will rediscuss with pathology results.    Anastasio Auerbach MD, 4:46 PM 10/21/2017

## 2017-10-23 ENCOUNTER — Ambulatory Visit (INDEPENDENT_AMBULATORY_CARE_PROVIDER_SITE_OTHER): Payer: BLUE CROSS/BLUE SHIELD | Admitting: Family Medicine

## 2017-10-23 ENCOUNTER — Encounter: Payer: Self-pay | Admitting: Family Medicine

## 2017-10-23 VITALS — BP 128/86 | HR 94 | Temp 97.7°F | Ht 67.5 in | Wt 233.6 lb

## 2017-10-23 DIAGNOSIS — R252 Cramp and spasm: Secondary | ICD-10-CM | POA: Diagnosis not present

## 2017-10-23 DIAGNOSIS — E876 Hypokalemia: Secondary | ICD-10-CM | POA: Diagnosis not present

## 2017-10-23 DIAGNOSIS — I10 Essential (primary) hypertension: Secondary | ICD-10-CM

## 2017-10-23 DIAGNOSIS — R519 Headache, unspecified: Secondary | ICD-10-CM

## 2017-10-23 DIAGNOSIS — R51 Headache: Secondary | ICD-10-CM | POA: Diagnosis not present

## 2017-10-23 MED ORDER — HYDROCHLOROTHIAZIDE 25 MG PO TABS: 25.0000 mg | ORAL_TABLET | Freq: Every day | ORAL | 1 refills | Status: DC

## 2017-10-23 NOTE — Progress Notes (Signed)
Subjective:  By signing my name below, I, [name], attest that this documentation has been prepared under the direction and in the presence of [name], MD Electronically Signed: Ladene Artist, ED Scribe 10/23/2017 at 4:05 PM.   Patient ID: Robin Fox, female    DOB: July 30, 1975, 42 y.o.   MRN: [id number]  Chief Complaint  Patient presents with  . Blood Pressure Check    1 month follow up   HPI Robin Fox is a 42 y.o. female who presents to Primary Care at [facility] for f/u. Last seen 4/19.  HTN BP Readings from Last 3 Encounters:  10/23/17 128/86  10/21/17 122/78  10/11/17 (!) 157/86  Had been off meds for a few days at that time. Restarted HCTZ 25 mg qd. - Pt denies cp, sob, lightheadedness, dizziness.  Heartburn Discussed OTC HT blocker and list of trigger foods was provided. - Has not needed to take Zantac. Has made some dietary changes.  HAs Pt states that she occasionally gets stress/tension HAs. She has been worried lately about irregular bleeding and uterine leiomyoma. States HAs improves some with Motrin but not entirely. Also reports that she has not been drinking enough water. Denies nausea, vomiting, blurred vision.  Muscle Spasms Pt reports muscle cramps/spasms in upper chest that started wks after taking meds. States spasms resolved with taking muscle relaxants. Denies spasms within the past wk, cramps in LE. Pt did have a low potassium of 3.3 on 5/18.  Tobacco Abuse Resources provided for cessation last visit including option of Chantix. - Pt has been cutting back on smoking.  Patient Active Problem List   Diagnosis Date Noted  . Tobacco use 08/01/2016  . Routine postpartum follow-up 05/22/2012  . Mild or unspecified pre-eclampsia, with delivery 04/07/2012  . Abnormal maternal glucose tolerance, antepartum 03/27/2012  . History of IUGR (intrauterine growth retardation) and stillbirth, currently pregnant 02/12/2012  .  History of stillbirth 12/12/2011  . Previous cesarean delivery, antepartum condition or complication 49/67/5916  . Obesity (BMI 30-39.9) 11/21/2011  . Hx of herpes genitalis 11/21/2011  . Hypertension in pregnancy, essential, antepartum 11/09/2011  . Essential hypertension 11/09/2011  . AMA (advanced maternal age) multigravida 35+ 11/09/2011   Past Medical History:  Diagnosis Date  . Anemia    hx   . Gestational diabetes    Neg 2 hr test after preg  . Headache(784.0)   . Herpes genitalia    2013 - 1st outbreak  . Hypertension    2008  . Pre-eclampsia in third trimester   . Seasonal allergies   . Shortness of breath    w exertion  . Trichomonas   . Twins    Past Surgical History:  Procedure Laterality Date  . CESAREAN SECTION     X 2  . CESAREAN SECTION  04/21/2012   Procedure: CESAREAN SECTION;  Surgeon: Woodroe Mode, MD;  Location: [facility];  Service: Obstetrics;  Laterality: N/A;   Allergies  Allergen Reactions  . Penicillins Hives, Shortness Of Breath and Swelling    Has patient had a PCN reaction causing immediate rash, facial/tongue/throat swelling, SOB or lightheadedness with hypotension: no Has patient had a PCN reaction causing severe rash involving mucus membranes or skin necrosis:no Has patient had a PCN reaction that required hospitalization no Has patient had a PCN reaction occurring within the last 10 years: {Yes If all of the above answers are "NO", then may proceed with Cephalosporin use.   Prior to Admission medications  Medication Sig Start Date End Date Taking? Authorizing Provider  hydrochlorothiazide (HYDRODIURIL) 25 MG tablet Take 1 tablet (25 mg total) by mouth daily. 09/12/17  Yes [name], MD  megestrol (MEGACE) 20 MG tablet Take 1 tablet (20 mg total) by mouth daily. 09/16/17  Yes Fontaine, Belinda Block, MD  valACYclovir (VALTREX) 500 MG tablet Take 1 tablet (500 mg total) by mouth daily. 08/26/16  Yes Terrance Mass, MD   Social History     Socioeconomic History  . Marital status: Married    Spouse name: Not on file  . Number of children: Not on file  . Years of education: Not on file  . Highest education level: Not on file  Occupational History  . Not on file  Social Needs  . Financial resource strain: Not on file  . Food insecurity:    Worry: Not on file    Inability: Not on file  . Transportation needs:    Medical: Not on file    Non-medical: Not on file  Tobacco Use  . Smoking status: Current Every Day Smoker    Packs/day: 0.50    Years: 17.00    Pack years: 8.50    Types: Cigarettes    Last attempt to quit: 10/11/2011    Years since quitting: 6.0  . Smokeless tobacco: Never Used  Substance and Sexual Activity  . Alcohol use: Yes    Alcohol/week: 0.0 oz    Comment: socially  . Drug use: No  . Sexual activity: Yes    Birth control/protection: Other-see comments, None    Comment: Partner had vasectomy  Lifestyle  . Physical activity:    Days per week: Not on file    Minutes per session: Not on file  . Stress: Not on file  Relationships  . Social connections:    Talks on phone: Not on file    Gets together: Not on file    Attends religious service: Not on file    Active member of club or organization: Not on file    Attends meetings of clubs or organizations: Not on file    Relationship status: Not on file  . Intimate partner violence:    Fear of current or ex partner: Not on file    Emotionally abused: Not on file    Physically abused: Not on file    Forced sexual activity: Not on file  Other Topics Concern  . Not on file  Social History Narrative  . Not on file   Review of Systems  Constitutional: Negative for fatigue and unexpected weight change.  Eyes: Negative for visual disturbance.  Respiratory: Negative for chest tightness and shortness of breath.   Cardiovascular: Negative for chest pain, palpitations and leg swelling.  Gastrointestinal: Negative for abdominal pain, blood in  stool, nausea and vomiting.  Musculoskeletal: Positive for myalgias.  Neurological: Positive for headaches. Negative for dizziness, syncope and light-headedness.      Objective:   Physical Exam  Constitutional: She is oriented to person, place, and time. She appears well-developed and well-nourished.  HENT:  Head: Normocephalic and atraumatic.  No focal tenderness of the face, sinuses, temples.  Eyes: Pupils are equal, round, and reactive to light. Conjunctivae and EOM are normal.  Neck: Carotid bruit is not present.  Cardiovascular: Normal rate, regular rhythm, normal heart sounds and intact distal pulses.  Pulmonary/Chest: Effort normal and breath sounds normal.  Abdominal: Soft. She exhibits no pulsatile midline mass. There is no tenderness.  Lymphadenopathy:  She has no cervical adenopathy.  Neurological: She is alert and oriented to person, place, and time.  Skin: Skin is warm and dry.  Psychiatric: She has a normal mood and affect. Her behavior is normal.  Vitals reviewed.  Vitals:   10/23/17 1529 10/23/17 1532  BP: 128/86   Pulse: 94   Temp: (!) 97.2 F (36.2 C) 97.7 F (36.5 C)  TempSrc: Oral Oral  SpO2: 100%   Weight: 233 lb 9.6 oz (106 kg)   Height: 5' 7.5" (1.715 m)       Assessment & Plan:    Robin Fox is a 42 y.o. female Essential hypertension - Plan: hydrochlorothiazide (HYDRODIURIL) 25 MG tablet  -Improved control.  Continue same dose of HCTZ.  Hypokalemia - Plan: Basic metabolic panel Muscle cramp  -Increase hydration, especially during recent heat and reports of decreased fluid intake.  Cramps may be due to hypokalemia noted on most recent blood work.  Recommended potassium rich foods each day, check a BMP to consider supplementation, RTC precautions if cramping persists.    Nonintractable episodic headache, unspecified headache type  -Nonfocal neuro exam.  May be related to current stressors regarding her irregular bleeding.  Also  recommended increase fluid intake as well as volume depletion may also contribute to headache.  RTC precautions if persistent or not improving with above.  Meds ordered this encounter  Medications  . hydrochlorothiazide (HYDRODIURIL) 25 MG tablet    Sig: Take 1 tablet (25 mg total) by mouth daily.    Dispense:  90 tablet    Refill:  1   Patient Instructions    Blood pressure looks better today.  Continue same dose of hydrochlorothiazide.  However your potassium was low when checked recently, and that can be related to that medication.  That may also contribute to the muscle cramps.  Increase water intake, potassium rich foods such as bananas each day, but I will check the potassium level again to see if a prescription supplement as needed.  If you continue to have muscle cramps, please follow-up to discuss other treatments.  Otherwise follow-up in 6 months.  Continue to work on cutting back on smoking.  Let me know if other resources are needed to help her quit smoking. Let me know if there are questions.    Muscle Cramps and Spasms Muscle cramps and spasms are when muscles tighten by themselves. They usually get better within minutes. Muscle cramps are painful. They are usually stronger and last longer than muscle spasms. Muscle spasms may or may not be painful. They can last a few seconds or much longer. Follow these instructions at home:  Drink enough fluid to keep your pee (urine) clear or pale yellow.  Massage, stretch, and relax the muscle.  If directed, apply heat to tight or tense muscles as often as told by your doctor. Use the heat source that your doctor recommends. ? Place a towel between your skin and the heat source. ? Leave the heat on for 20-30 minutes. ? Take off the heat if your skin turns bright red. This is especially important if you are unable to feel pain, heat, or cold. You may have a greater risk of getting burned.  If directed, put ice on the affected area. This  may help if you are sore or have pain after a cramp or spasm. ? Put ice in a plastic bag. ? Place a towel between your skin and the bag. ? Leave the ice on for 20  minutes, 2-3 times a day.  Take over-the-counter and prescription medicines only as told by your doctor.  Pay attention to any changes in your symptoms. Contact a doctor if:  Your cramps or spasms get worse or happen more often.  Your cramps or spasms do not get better with time. This information is not intended to replace advice given to you by your health care provider. Make sure you discuss any questions you have with your health care provider. Document Released: 04/25/2008 Document Revised: 06/14/2015 Document Reviewed: 02/14/2015 Elsevier Interactive Patient Education  2018 [place] Headache Without Cause A headache is pain or discomfort felt around the head or neck area. There are many causes and types of headaches. In some cases, the cause may not be found. Follow these instructions at home: Managing pain  Take over-the-counter and prescription medicines only as told by your doctor.  Lie down in a dark, quiet room when you have a headache.  If directed, apply ice to the head and neck area: ? Put ice in a plastic bag. ? Place a towel between your skin and the bag. ? Leave the ice on for 20 minutes, 2-3 times per day.  Use a heating pad or hot shower to apply heat to the head and neck area as told by your doctor.  Keep lights dim if bright lights bother you or make your headaches worse. Eating and drinking  Eat meals on a regular schedule.  Lessen how much alcohol you drink.  Lessen how much caffeine you drink, or stop drinking caffeine. General instructions  Keep all follow-up visits as told by your doctor. This is important.  Keep a journal to find out if certain things bring on headaches. For example, write down: ? What you eat and drink. ? How much sleep you get. ? Any change to your  diet or medicines.  Relax by getting a massage or doing other relaxing activities.  Lessen stress.  Sit up straight. Do not tighten (tense) your muscles.  Do not use tobacco products. This includes cigarettes, chewing tobacco, or e-cigarettes. If you need help quitting, ask your doctor.  Exercise regularly as told by your doctor.  Get enough sleep. This often means 7-9 hours of sleep. Contact a doctor if:  Your symptoms are not helped by medicine.  You have a headache that feels different than the other headaches.  You feel sick to your stomach (nauseous) or you throw up (vomit).  You have a fever. Get help right away if:  Your headache becomes really bad.  You keep throwing up.  You have a stiff neck.  You have trouble seeing.  You have trouble speaking.  You have pain in the eye or ear.  Your muscles are weak or you lose muscle control.  You lose your balance or have trouble walking.  You feel like you will pass out (faint) or you pass out.  You have confusion. This information is not intended to replace advice given to you by your health care provider. Make sure you discuss any questions you have with your health care provider. Document Released: 02/20/2008 Document Revised: 10/19/2015 Document Reviewed: 09/05/2014 Elsevier Interactive Patient Education  2018 [employer].   IF you received an x-ray today, you will receive an invoice from [facility]. Please contact [facility] at [phone] with questions or concerns regarding your invoice.   IF you received labwork today, you will receive an invoice from [facility]. Please contact [facility] at [phone]  with questions or concerns regarding your invoice.   Our billing staff will not be able to assist you with questions regarding bills from these companies.  You will be contacted with the lab results as soon as they are available. The fastest way to get your results is to activate your My  Chart account. Instructions are located on the last page of this paperwork. If you have not heard from [place] regarding the results in 2 weeks, please contact this office.       I personally performed the services described in this documentation, which was scribed in my presence. The recorded information has been reviewed and considered for accuracy and completeness, addended by me as needed, and agree with information above.  Signed,   Merri Ray, MD Primary Care at [place].  10/23/17 4:20 PM

## 2017-10-23 NOTE — Patient Instructions (Addendum)
Blood pressure looks better today.  Continue same dose of hydrochlorothiazide.  However your potassium was low when checked recently, and that can be related to that medication.  That may also contribute to the muscle cramps.  Increase water intake, potassium rich foods such as bananas each day, but I will check the potassium level again to see if a prescription supplement as needed.  If you continue to have muscle cramps, please follow-up to discuss other treatments.  Otherwise follow-up in 6 months.  Continue to work on cutting back on smoking.  Let me know if other resources are needed to help her quit smoking. Let me know if there are questions.    Muscle Cramps and Spasms Muscle cramps and spasms are when muscles tighten by themselves. They usually get better within minutes. Muscle cramps are painful. They are usually stronger and last longer than muscle spasms. Muscle spasms may or may not be painful. They can last a few seconds or much longer. Follow these instructions at home:  Drink enough fluid to keep your pee (urine) clear or pale yellow.  Massage, stretch, and relax the muscle.  If directed, apply heat to tight or tense muscles as often as told by your doctor. Use the heat source that your doctor recommends. ? Place a towel between your skin and the heat source. ? Leave the heat on for 20-30 minutes. ? Take off the heat if your skin turns bright red. This is especially important if you are unable to feel pain, heat, or cold. You may have a greater risk of getting burned.  If directed, put ice on the affected area. This may help if you are sore or have pain after a cramp or spasm. ? Put ice in a plastic bag. ? Place a towel between your skin and the bag. ? Leave the ice on for 20 minutes, 2-3 times a day.  Take over-the-counter and prescription medicines only as told by your doctor.  Pay attention to any changes in your symptoms. Contact a doctor if:  Your cramps or spasms get  worse or happen more often.  Your cramps or spasms do not get better with time. This information is not intended to replace advice given to you by your health care provider. Make sure you discuss any questions you have with your health care provider. Document Released: 04/25/2008 Document Revised: 06/14/2015 Document Reviewed: 02/14/2015 Elsevier Interactive Patient Education  2018 [place] Headache Without Cause A headache is pain or discomfort felt around the head or neck area. There are many causes and types of headaches. In some cases, the cause may not be found. Follow these instructions at home: Managing pain  Take over-the-counter and prescription medicines only as told by your doctor.  Lie down in a dark, quiet room when you have a headache.  If directed, apply ice to the head and neck area: ? Put ice in a plastic bag. ? Place a towel between your skin and the bag. ? Leave the ice on for 20 minutes, 2-3 times per day.  Use a heating pad or hot shower to apply heat to the head and neck area as told by your doctor.  Keep lights dim if bright lights bother you or make your headaches worse. Eating and drinking  Eat meals on a regular schedule.  Lessen how much alcohol you drink.  Lessen how much caffeine you drink, or stop drinking caffeine. General instructions  Keep all follow-up visits as told  by your doctor. This is important.  Keep a journal to find out if certain things bring on headaches. For example, write down: ? What you eat and drink. ? How much sleep you get. ? Any change to your diet or medicines.  Relax by getting a massage or doing other relaxing activities.  Lessen stress.  Sit up straight. Do not tighten (tense) your muscles.  Do not use tobacco products. This includes cigarettes, chewing tobacco, or e-cigarettes. If you need help quitting, ask your doctor.  Exercise regularly as told by your doctor.  Get enough sleep. This often  means 7-9 hours of sleep. Contact a doctor if:  Your symptoms are not helped by medicine.  You have a headache that feels different than the other headaches.  You feel sick to your stomach (nauseous) or you throw up (vomit).  You have a fever. Get help right away if:  Your headache becomes really bad.  You keep throwing up.  You have a stiff neck.  You have trouble seeing.  You have trouble speaking.  You have pain in the eye or ear.  Your muscles are weak or you lose muscle control.  You lose your balance or have trouble walking.  You feel like you will pass out (faint) or you pass out.  You have confusion. This information is not intended to replace advice given to you by your health care provider. Make sure you discuss any questions you have with your health care provider. Document Released: 02/20/2008 Document Revised: 10/19/2015 Document Reviewed: 09/05/2014 Elsevier Interactive Patient Education  2018 [employer].   IF you received an x-ray today, you will receive an invoice from [facility]. Please contact [facility] at [phone] with questions or concerns regarding your invoice.   IF you received labwork today, you will receive an invoice from [place]. Please contact [facility] at [phone] with questions or concerns regarding your invoice.   Our billing staff will not be able to assist you with questions regarding bills from these companies.  You will be contacted with the lab results as soon as they are available. The fastest way to get your results is to activate your My Chart account. Instructions are located on the last page of this paperwork. If you have not heard from [place] regarding the results in 2 weeks, please contact this office.

## 2017-10-24 LAB — BASIC METABOLIC PANEL
BUN / CREAT RATIO: 9 (ref 9–23)
BUN: 8 mg/dL (ref 6–24)
CO2: 22 mmol/L (ref 20–29)
CREATININE: 0.85 mg/dL (ref 0.57–1.00)
Calcium: 9.9 mg/dL (ref 8.7–10.2)
Chloride: 99 mmol/L (ref 96–106)
GFR, EST AFRICAN AMERICAN: 99 mL/min/{1.73_m2} (ref >59)
GFR, EST NON AFRICAN AMERICAN: 86 mL/min/{1.73_m2} (ref >59)
Glucose: 95 mg/dL (ref 65–99)
Potassium: 4.2 mmol/L (ref 3.5–5.2)
SODIUM: 138 mmol/L (ref 134–144)

## 2017-10-24 NOTE — Telephone Encounter (Signed)
We had discussed D&C because of the possibility the area we were looking at would be a polyp.  We also discussed that it may represent a blood clot that sometimes mimics a polyp.  If the biopsy from the ultrasound showed purely endometrial tissue but no blood clot then I would lean towards doing the D&C.  Since that showed blood clot as noted by the pathologist there is a good chance that we evacuated that area and what I am saying is that for now I am comfortable watching and if her bleeding resolves then to follow it.  If irregular bleeding continues then we would proceed with D&C.  If patient would feel most comfortable with D&C now then we can make arrangements for that.

## 2017-10-27 ENCOUNTER — Encounter: Payer: Self-pay | Admitting: Gynecology

## 2017-10-27 ENCOUNTER — Ambulatory Visit: Payer: BLUE CROSS/BLUE SHIELD | Admitting: Gynecology

## 2017-10-27 VITALS — BP 120/76 | Ht 68.0 in | Wt 230.0 lb

## 2017-10-27 DIAGNOSIS — Z01419 Encounter for gynecological examination (general) (routine) without abnormal findings: Secondary | ICD-10-CM | POA: Diagnosis not present

## 2017-10-27 DIAGNOSIS — Z113 Encounter for screening for infections with a predominantly sexual mode of transmission: Secondary | ICD-10-CM

## 2017-10-27 MED ORDER — VALACYCLOVIR HCL 500 MG PO TABS: 500.0000 mg | ORAL_TABLET | Freq: Two times a day (BID) | ORAL | 1 refills | Status: DC

## 2017-10-27 NOTE — Patient Instructions (Signed)
Call to Schedule your mammogram  Facilities in [place]: 1)  [facility]. [facility], [address] Phone: [phone] 2)  Dr. Bertrand at [facility]  [address] Phone: [phone]     Mammogram A mammogram is an X-ray test to find changes in a woman's breast. You should get a mammogram if:  You are 40 years of age or older  You have risk factors.   Your doctor recommends that you have one.  BEFORE THE TEST  Do not schedule the test the week before your period, especially if your breasts are sore during this time.  On the day of your mammogram:  Wash your breasts and armpits well. After washing, do not put on any deodorant or talcum powder on until after your test.   Eat and drink as you usually do.   Take your medicines as usual.   If you are diabetic and take insulin, make sure you:   Eat before coming for your test.   Take your insulin as usual.   If you cannot keep your appointment, call before the appointment to cancel. Schedule another appointment.  TEST  You will need to undress from the waist up. You will put on a hospital gown.   Your breast will be put on the mammogram machine, and it will press firmly on your breast with a piece of plastic called a compression paddle. This will make your breast flatter so that the machine can X-ray all parts of your breast.   Both breasts will be X-rayed. Each breast will be X-rayed from above and from the side. An X-ray might need to be taken again if the picture is not good enough.   The mammogram will last about 15 to 30 minutes.  AFTER THE TEST Finding out the results of your test Ask when your test results will be ready. Make sure you get your test results.  Document Released: 08/09/2008 Document Revised: 05/02/2011 Document Reviewed: 08/09/2008 [employer] Patient Information 2012 [employer].   

## 2017-10-27 NOTE — Progress Notes (Signed)
    Robin Fox [birth year] [id number]        41 y.o.  [id number] for annual gynecologic exam.  Recent evaluation for irregular bleeding to include sonohysterogram with endometrial biopsy.  Has some spotting on and off since then.  Past medical history,surgical history, problem list, medications, allergies, family history and social history were all reviewed and documented as reviewed in the EPIC chart.  ROS:  Performed with pertinent positives and negatives included in the history, assessment and plan.   Additional significant findings : None   Exam: [name] assistant Vitals:   10/27/17 1615  BP: 120/76  Weight: 230 lb (104.3 kg)  Height: 5\' 8"  (1.727 m)   Body mass index is 34.97 kg/m.  General appearance:  Normal affect, orientation and appearance. Skin: Grossly normal HEENT: Without gross lesions.  No cervical or supraclavicular adenopathy. Thyroid normal.  Lungs:  Clear without wheezing, rales or rhonchi Cardiac: RR, without RMG Abdominal:  Soft, nontender, without masses, guarding, rebound, organomegaly or hernia Breasts:  Examined lying and sitting without masses, retractions, discharge or axillary adenopathy. Pelvic:  Ext, BUS, Vagina: Normal  Cervix: Normal.  GC/Chlamydia  Uterus: Generous in size, midline and mobile nontender   Adnexa: Without masses or tenderness    Anus and perineum: Normal   Rectovaginal: Normal sphincter tone without palpated masses or tenderness.    Assessment/Plan:  42 y.o. [id number] female for annual gynecologic exam with recent irregular bleeding, vasectomy birth control.   1. Irregular bleeding.  Patient recently evaluated with sonohysterogram last week showing questionable endometrial polyp versus clot.  Biopsy showed proliferative endometrium with hemorrhage consistent with passage of the clot.  We reviewed the options of management to include proceeding with hysteroscopy D&C now knowing I cannot guarantee there is not an  endometrial polyp versus menstrual calendar and seeing how she does and if regular menses ensue then following expectantly.  At this point the patient is comfortable with expectant management.  She will call me if irregular bleeding continues. 2. STD screening.  Patient requests STD screening.  No known exposure.  GC/chlamydia, HIV, RPR, hepatitis B, hepatitis C ordered. 3. Recommended baseline mammogram now and patient agrees to call and schedule.  Breast exam normal today. 4. 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  Takes Valtrex 500 mg twice daily times several days at [place].  Discussed options for daily suppressive therapy.  The patient would prefer to continue with intermittent treatment.  Valtrex 500 mg #60 1 p.o. twice daily x5 days at earliest onset of outbreak with 1 refill. 5. Pap smear/HPV 2018.  No Pap smear done today.  No history of abnormal Pap smears previously.  Plan repeat Pap smear at 5-year interval per current screening guidelines. 6. Health maintenance.  Patient recently had routine lab work done through her primary physician's office.  Follow-up in 1 year for annual exam, sooner if irregular bleeding continues.   Anastasio Auerbach MD, 4:42 PM 10/27/2017

## 2017-10-28 LAB — HEPATITIS B SURFACE ANTIGEN: HEP B S AG: NONREACTIVE

## 2017-10-28 LAB — HEPATITIS C ANTIBODY
HEP C AB: NONREACTIVE
SIGNAL TO CUT-OFF: 0.08 (ref <1.00)

## 2017-10-28 LAB — HIV ANTIBODY (ROUTINE TESTING W REFLEX): HIV: NONREACTIVE

## 2017-10-28 LAB — C. TRACHOMATIS/N. GONORRHOEAE RNA
C. TRACHOMATIS RNA, TMA: NOT DETECTED
N. GONORRHOEAE RNA, TMA: NOT DETECTED

## 2017-10-28 LAB — RPR: RPR Ser Ql: NONREACTIVE

## 2017-10-30 NOTE — Telephone Encounter (Signed)
I would expect some irregularity if nothing else associated with the ultrasound and the biopsy.  Keep a menstrual calendar through June and will see what next month brings.  If irregularity then call.

## 2017-11-11 ENCOUNTER — Encounter: Payer: BLUE CROSS/BLUE SHIELD | Admitting: Gynecology

## 2017-11-23 DIAGNOSIS — Z716 Tobacco abuse counseling: Secondary | ICD-10-CM | POA: Diagnosis not present

## 2017-11-23 DIAGNOSIS — K0889 Other specified disorders of teeth and supporting structures: Secondary | ICD-10-CM | POA: Diagnosis not present

## 2017-11-23 DIAGNOSIS — Z880 Allergy status to penicillin: Secondary | ICD-10-CM | POA: Diagnosis not present

## 2017-11-23 DIAGNOSIS — I10 Essential (primary) hypertension: Secondary | ICD-10-CM | POA: Diagnosis not present

## 2017-11-23 DIAGNOSIS — F17200 Nicotine dependence, unspecified, uncomplicated: Secondary | ICD-10-CM | POA: Diagnosis not present

## 2017-11-23 DIAGNOSIS — Z79899 Other long term (current) drug therapy: Secondary | ICD-10-CM | POA: Diagnosis not present

## 2017-12-11 ENCOUNTER — Other Ambulatory Visit: Payer: Self-pay | Admitting: Gynecology

## 2017-12-11 DIAGNOSIS — Z1231 Encounter for screening mammogram for malignant neoplasm of breast: Secondary | ICD-10-CM

## 2018-01-13 ENCOUNTER — Ambulatory Visit
Admission: RE | Admit: 2018-01-13 | Discharge: 2018-01-13 | Disposition: A | Discharge status: Home / Self Care | Payer: BLUE CROSS/BLUE SHIELD | Source: Ambulatory Visit | Attending: Gynecology | Admitting: Gynecology

## 2018-01-13 DIAGNOSIS — Z1231 Encounter for screening mammogram for malignant neoplasm of breast: Secondary | ICD-10-CM

## 2018-02-10 ENCOUNTER — Encounter: Payer: Self-pay | Admitting: Gynecology

## 2018-02-11 NOTE — Telephone Encounter (Signed)
Sounds like an office visit is in order

## 2018-02-13 ENCOUNTER — Encounter: Payer: Self-pay | Admitting: Gynecology

## 2018-02-13 ENCOUNTER — Ambulatory Visit (INDEPENDENT_AMBULATORY_CARE_PROVIDER_SITE_OTHER): Payer: BLUE CROSS/BLUE SHIELD | Admitting: Gynecology

## 2018-02-13 VITALS — BP 124/80

## 2018-02-13 DIAGNOSIS — N912 Amenorrhea, unspecified: Secondary | ICD-10-CM | POA: Diagnosis not present

## 2018-02-13 NOTE — Progress Notes (Signed)
    Robin Fox 12/10/1975 [id number]        42 y.o.  [id number] presents complaining of late menses.  Was evaluated earlier this year because of irregular bleeding.  She had regular menses through April and then started having irregular bleeding afterwards.  Sonohysterogram had an endometrial defect that ultimately was thought to be a clot.  Her biopsy showed proliferative endometrium.  Her last menstrual period was 01/05/2018.  No bleeding since.  Mother went through premature menopause in her early 34s and patient feels that she is doing the same.  We did have a normal FSH TSH earlier this year with work-up of her irregular bleeding.  Vasectomy birth control.  Past medical history,surgical history, problem list, medications, allergies, family history and social history were all reviewed and documented in the EPIC chart.  Directed ROS with pertinent positives and negatives documented in the history of present illness/assessment and plan.  Exam: Robin Fox assistant Vitals:   02/13/18 0813  BP: 124/80   General appearance:  Normal Abdomen soft nontender without masses guarding rebound Pelvic external BUS vagina normal.  Cervix normal.  Uterus grossly normal size midline mobile nontender.  Adnexa without masses or tenderness.  Assessment/Plan:  42 y.o. [id number] with skipped menses.  Discussed anovulation with her.  She is worried about early menopause.  We will go ahead and recheck [facility] now for her reassurance.  We will also check qualitative hCG.  Discussed progesterone withdrawal.  We discussed the physiology of ovulation and what happens with irregular ovulation.  Issues of hyperplasia/uterine cancer with prolonged anovulation discussed.  She will follow-up for the blood test results and then tentatively we will plan progesterone withdrawal intermittently as needed.  I spent a total of 15 face-to-face minutes with the patient, over 50% was spent counseling and coordination of  care.   Robin Auerbach MD, 8:51 AM 02/13/2018

## 2018-02-13 NOTE — Patient Instructions (Signed)
Follow-up for blood test results.

## 2018-02-14 LAB — FOLLICLE STIMULATING HORMONE: FSH: 16.1 m[IU]/mL

## 2018-02-14 LAB — HCG, SERUM, QUALITATIVE: Preg, Serum: NEGATIVE

## 2018-02-19 DIAGNOSIS — N926 Irregular menstruation, unspecified: Secondary | ICD-10-CM | POA: Diagnosis not present

## 2018-02-19 DIAGNOSIS — N952 Postmenopausal atrophic vaginitis: Secondary | ICD-10-CM | POA: Diagnosis not present

## 2018-02-19 DIAGNOSIS — N912 Amenorrhea, unspecified: Secondary | ICD-10-CM | POA: Diagnosis not present

## 2018-03-17 ENCOUNTER — Ambulatory Visit (HOSPITAL_COMMUNITY)
Admission: EM | Admit: 2018-03-17 | Discharge: 2018-03-17 | Disposition: A | Discharge status: Home / Self Care | Payer: BLUE CROSS/BLUE SHIELD | Attending: Family Medicine | Admitting: Family Medicine

## 2018-03-17 ENCOUNTER — Encounter (HOSPITAL_COMMUNITY): Payer: Self-pay | Admitting: Emergency Medicine

## 2018-03-17 DIAGNOSIS — R062 Wheezing: Secondary | ICD-10-CM

## 2018-03-17 DIAGNOSIS — R05 Cough: Secondary | ICD-10-CM

## 2018-03-17 DIAGNOSIS — J069 Acute upper respiratory infection, unspecified: Secondary | ICD-10-CM

## 2018-03-17 DIAGNOSIS — R059 Cough, unspecified: Secondary | ICD-10-CM

## 2018-03-17 MED ORDER — HYDROCODONE-HOMATROPINE 5-1.5 MG/5ML PO SYRP: 5.0000 mL | ORAL_SOLUTION | Freq: Four times a day (QID) | ORAL | 0 refills | Status: DC | PRN

## 2018-03-17 MED ORDER — PREDNISONE 10 MG (21) PO TBPK: ORAL_TABLET | Freq: Every day | ORAL | 0 refills | Status: DC

## 2018-03-17 NOTE — ED Triage Notes (Signed)
PT reports cough, wheezing, SOB, fatigue for 5 days.

## 2018-03-18 NOTE — ED Provider Notes (Signed)
[place]   [id number] 03/17/18 Arrival Time: 1940  ASSESSMENT & PLAN:  1. Viral upper respiratory tract infection   2. Cough   3. Wheezing    No suspicion of pneumonia. Discussed s/s to watch for.  Meds ordered this encounter  Medications  . predniSONE (STERAPRED UNI-PAK 21 TAB) 10 MG (21) TBPK tablet    Sig: Take by mouth daily. Take as directed.    Dispense:  21 tablet    Refill:  0  . HYDROcodone-homatropine (HYCODAN) 5-1.5 MG/5ML syrup    Sig: Take 5 mLs by mouth every 6 (six) hours as needed for cough.    Dispense:  90 mL    Refill:  0   Cough medication sedation precautions. Discussed typical duration of symptoms. OTC symptom care as needed. Ensure adequate fluid intake and rest. May f/u with PCP or here as needed.  Reviewed expectations re: course of current medical issues. Questions answered. Outlined signs and symptoms indicating need for more acute intervention. Patient verbalized understanding. After Visit Summary given.   SUBJECTIVE: History from: patient.  Robin Fox is a 42 y.o. female who presents with complaint of nasal congestion, post-nasal drainage, and a persistent dry cough. Onset abrupt, 3-4 days ago. Overall with fatigue and with body aches. SOB: none. Wheezing: mild to moderate when present. Fever: none reported. Overall normal PO intake without n/v. Sick contacts: no. No specific or significant aggravating or alleviating factors reported. OTC treatment: none reported. Has inhaler at home; temporary help with wheezing. No specific aggravating or alleviating factors reported.  Received flu shot this year: no.  Social History   Tobacco Use  Smoking Status Current Some Day Smoker  . Packs/day: 0.50  . Years: 17.00  . Pack years: 8.50  . Types: Cigarettes  . Last attempt to quit: 10/11/2011  . Years since quitting: 6.4  Smokeless Tobacco Never Used    ROS: As per HPI.   OBJECTIVE:  Vitals:   03/17/18 1959  03/17/18 2001  BP:  (!) 143/78  Pulse: 81   Resp: 20   Temp: 98.5 F (36.9 C)   TempSrc: Oral   SpO2: 100%     General appearance: alert; appears fatigued HEENT: nasal congestion; clear runny nose; throat irritation secondary to post-nasal drainage Neck: supple without LAD CV: RRR without murmer Lungs: unlabored respirations, symmetrical air entry; cough: moderate;bilateral expiratory wheezing; no respiratory distress Ext: no edema of LE bilaterally Skin: warm and dry Psychological: alert and cooperative; normal mood and affect   Allergies  Allergen Reactions  . Penicillins Hives, Shortness Of Breath and Swelling    Has patient had a PCN reaction causing immediate rash, facial/tongue/throat swelling, SOB or lightheadedness with hypotension: no Has patient had a PCN reaction causing severe rash involving mucus membranes or skin necrosis:no Has patient had a PCN reaction that required hospitalization no Has patient had a PCN reaction occurring within the last 10 years: {Yes If all of the above answers are "NO", then may proceed with Cephalosporin use.    Past Medical History:  Diagnosis Date  . Anemia    hx   . Gestational diabetes    Neg 2 hr test after preg  . Headache(784.0)   . Herpes genitalia    2013 - 1st outbreak  . Hypertension    2008  . Pre-eclampsia in third trimester   . Seasonal allergies   . Shortness of breath    w exertion  . Trichomonas   . Twins  Family History  Problem Relation Age of Onset  . Hypertension Mother   . Stroke Mother   . Stroke Maternal Grandmother   . Heart disease Maternal Grandfather 60  . Hypertension Brother    Social History   Socioeconomic History  . Marital status: Married    Spouse name: Not on file  . Number of children: Not on file  . Years of education: Not on file  . Highest education level: Not on file  Occupational History  . Not on file  Social Needs  . Financial resource strain: Not on file  . Food  insecurity:    Worry: Not on file    Inability: Not on file  . Transportation needs:    Medical: Not on file    Non-medical: Not on file  Tobacco Use  . Smoking status: Current Some Day Smoker    Packs/day: 0.50    Years: 17.00    Pack years: 8.50    Types: Cigarettes    Last attempt to quit: 10/11/2011    Years since quitting: 6.4  . Smokeless tobacco: Never Used  Substance and Sexual Activity  . Alcohol use: Yes    Comment: socially  . Drug use: No  . Sexual activity: Yes    Birth control/protection: Other-see comments    Comment: Partner had vasectomy-1st intercourse 64 yo-More than 5 partners  Lifestyle  . Physical activity:    Days per week: Not on file    Minutes per session: Not on file  . Stress: Not on file  Relationships  . Social connections:    Talks on phone: Not on file    Gets together: Not on file    Attends religious service: Not on file    Active member of club or organization: Not on file    Attends meetings of clubs or organizations: Not on file    Relationship status: Not on file  . Intimate partner violence:    Fear of current or ex partner: Not on file    Emotionally abused: Not on file    Physically abused: Not on file    Forced sexual activity: Not on file  Other Topics Concern  . Not on file  Social History Narrative  . Not on file           Vanessa Kick, MD 03/18/18 [phone]

## 2018-05-21 ENCOUNTER — Encounter (HOSPITAL_COMMUNITY): Payer: Self-pay | Admitting: Emergency Medicine

## 2018-05-21 ENCOUNTER — Ambulatory Visit (HOSPITAL_COMMUNITY)
Admission: EM | Admit: 2018-05-21 | Discharge: 2018-05-21 | Disposition: A | Discharge status: Home / Self Care | Payer: BLUE CROSS/BLUE SHIELD | Attending: Family Medicine | Admitting: Family Medicine

## 2018-05-21 ENCOUNTER — Other Ambulatory Visit: Payer: Self-pay

## 2018-05-21 DIAGNOSIS — R05 Cough: Secondary | ICD-10-CM | POA: Insufficient documentation

## 2018-05-21 DIAGNOSIS — F17200 Nicotine dependence, unspecified, uncomplicated: Secondary | ICD-10-CM | POA: Insufficient documentation

## 2018-05-21 DIAGNOSIS — R059 Cough, unspecified: Secondary | ICD-10-CM

## 2018-05-21 MED ORDER — HYDROCODONE-HOMATROPINE 5-1.5 MG/5ML PO SYRP: 5.0000 mL | ORAL_SOLUTION | Freq: Four times a day (QID) | ORAL | 0 refills | Status: DC | PRN

## 2018-05-21 MED ORDER — AZITHROMYCIN 250 MG PO TABS: 250.0000 mg | ORAL_TABLET | Freq: Every day | ORAL | 0 refills | Status: DC

## 2018-05-21 NOTE — Discharge Instructions (Signed)
Be aware, your cough medication may cause drowsiness. Please do not drive, operate heavy machinery or make important decisions while on this medication, it can cloud your judgement.  

## 2018-05-21 NOTE — ED Triage Notes (Signed)
Sob and coughing up light green phlegm.  After coughing gets sharp pain in chest, wheezing.  Symptoms for 1 1/2 weeks.

## 2018-06-03 NOTE — ED Provider Notes (Signed)
[place]   [id number] 05/21/18 Arrival Time: 3295  ASSESSMENT & PLAN:  1. Cough   2. Smoker    Meds ordered this encounter  Medications  . HYDROcodone-homatropine (HYCODAN) 5-1.5 MG/5ML syrup    Sig: Take 5 mLs by mouth every 6 (six) hours as needed for cough.    Dispense:  90 mL    Refill:  0  . azithromycin (ZITHROMAX) 250 MG tablet    Sig: Take 1 tablet (250 mg total) by mouth daily. Take first 2 tablets together, then 1 every day until finished.    Dispense:  6 tablet    Refill:  0   Given duration of symptoms will treat as above. Cough medication sedation precautions. OTC symptom care as needed. Ensure adequate fluid intake and rest. May f/u with PCP or here as needed.  Reviewed expectations re: course of current medical issues. Questions answered. Outlined signs and symptoms indicating need for more acute intervention. Patient verbalized understanding. After Visit Summary given.   SUBJECTIVE: History from: patient.  Robin Fox is a 43 y.o. female who presents with complaint of a persistent dry cough; without sore throat. Onset abrupt, over two weeks ago with initial cold symptoms that have now resolved. Overall without fatigue and without body aches. SOB: none. Wheezing: none. Fever: no. Overall normal PO intake without n/v. Known sick contacts: no. No specific or significant aggravating or alleviating factors reported. OTC treatment: cough medications without relief. No new Rx medications.  Received flu shot this year: no.  Social History   Tobacco Use  Smoking Status Current Some Day Smoker  . Packs/day: 0.50  . Years: 17.00  . Pack years: 8.50  . Types: Cigarettes  . Last attempt to quit: 10/11/2011  . Years since quitting: 6.6  Smokeless Tobacco Never Used    ROS: As per HPI. All other systems negative.    OBJECTIVE:  Vitals:   05/21/18 1914  BP: (!) 142/70  Pulse: 90  Resp: (!) 22  Temp: 98.3 F (36.8 C)    TempSrc: Oral  SpO2: 100%    General appearance: alert; appears fatigued HEENT: mild nasal congestion; clear runny nose; throat irritation secondary to post-nasal drainage Neck: supple without LAD CV: RRR Lungs: unlabored respirations, symmetrical air entry without wheezing; cough: moderate Abd: soft Ext: no LE edema Skin: warm and dry Psychological: alert and cooperative; normal mood and affect  Allergies  Allergen Reactions  . Penicillins Hives, Shortness Of Breath and Swelling    Has patient had a PCN reaction causing immediate rash, facial/tongue/throat swelling, SOB or lightheadedness with hypotension: no Has patient had a PCN reaction causing severe rash involving mucus membranes or skin necrosis:no Has patient had a PCN reaction that required hospitalization no Has patient had a PCN reaction occurring within the last 10 years: {Yes If all of the above answers are "NO", then may proceed with Cephalosporin use.    Past Medical History:  Diagnosis Date  . Anemia    hx   . Gestational diabetes    Neg 2 hr test after preg  . Headache(784.0)   . Herpes genitalia    2013 - 1st outbreak  . Hypertension    2008  . Pre-eclampsia in third trimester   . Seasonal allergies   . Shortness of breath    w exertion  . Trichomonas   . Twins    Family History  Problem Relation Age of Onset  . Hypertension Mother   . Stroke Mother   .  Stroke Maternal Grandmother   . Heart disease Maternal Grandfather 60  . Hypertension Brother    Social History   Socioeconomic History  . Marital status: Married    Spouse name: Not on file  . Number of children: Not on file  . Years of education: Not on file  . Highest education level: Not on file  Occupational History  . Not on file  Social Needs  . Financial resource strain: Not on file  . Food insecurity:    Worry: Not on file    Inability: Not on file  . Transportation needs:    Medical: Not on file    Non-medical: Not on file   Tobacco Use  . Smoking status: Current Some Day Smoker    Packs/day: 0.50    Years: 17.00    Pack years: 8.50    Types: Cigarettes    Last attempt to quit: 10/11/2011    Years since quitting: 6.6  . Smokeless tobacco: Never Used  Substance and Sexual Activity  . Alcohol use: Yes    Comment: socially  . Drug use: No  . Sexual activity: Yes    Birth control/protection: Other-see comments    Comment: Partner had vasectomy-1st intercourse 41 yo-More than 5 partners  Lifestyle  . Physical activity:    Days per week: Not on file    Minutes per session: Not on file  . Stress: Not on file  Relationships  . Social connections:    Talks on phone: Not on file    Gets together: Not on file    Attends religious service: Not on file    Active member of club or organization: Not on file    Attends meetings of clubs or organizations: Not on file    Relationship status: Not on file  . Intimate partner violence:    Fear of current or ex partner: Not on file    Emotionally abused: Not on file    Physically abused: Not on file    Forced sexual activity: Not on file  Other Topics Concern  . Not on file  Social History Narrative  . Not on file           Vanessa Kick, MD 06/03/18 [phone]

## 2018-06-25 ENCOUNTER — Emergency Department (HOSPITAL_COMMUNITY)
Admission: EM | Admit: 2018-06-25 | Discharge: 2018-06-26 | Disposition: A | Discharge status: Home / Self Care | Payer: BLUE CROSS/BLUE SHIELD | Attending: Emergency Medicine | Admitting: Emergency Medicine

## 2018-06-25 ENCOUNTER — Other Ambulatory Visit: Payer: Self-pay

## 2018-06-25 ENCOUNTER — Emergency Department (HOSPITAL_COMMUNITY): Payer: BLUE CROSS/BLUE SHIELD

## 2018-06-25 ENCOUNTER — Encounter (HOSPITAL_COMMUNITY): Payer: Self-pay | Admitting: Emergency Medicine

## 2018-06-25 ENCOUNTER — Ambulatory Visit (INDEPENDENT_AMBULATORY_CARE_PROVIDER_SITE_OTHER)
Admission: EM | Admit: 2018-06-25 | Discharge: 2018-06-25 | Disposition: A | Discharge status: Home / Self Care | Payer: BLUE CROSS/BLUE SHIELD | Source: Home / Self Care

## 2018-06-25 DIAGNOSIS — I10 Essential (primary) hypertension: Secondary | ICD-10-CM | POA: Diagnosis not present

## 2018-06-25 DIAGNOSIS — R079 Chest pain, unspecified: Secondary | ICD-10-CM

## 2018-06-25 DIAGNOSIS — Z79899 Other long term (current) drug therapy: Secondary | ICD-10-CM | POA: Diagnosis not present

## 2018-06-25 DIAGNOSIS — F17210 Nicotine dependence, cigarettes, uncomplicated: Secondary | ICD-10-CM | POA: Insufficient documentation

## 2018-06-25 DIAGNOSIS — R197 Diarrhea, unspecified: Secondary | ICD-10-CM | POA: Diagnosis not present

## 2018-06-25 DIAGNOSIS — R202 Paresthesia of skin: Secondary | ICD-10-CM | POA: Diagnosis not present

## 2018-06-25 LAB — I-STAT BETA HCG BLOOD, ED (MC, WL, AP ONLY): I-stat hCG, quantitative: <5 m[IU]/mL (ref <5)

## 2018-06-25 LAB — BASIC METABOLIC PANEL
Anion gap: 13 (ref 5–15)
BUN: <5 mg/dL — ABNORMAL LOW (ref 6–20)
CO2: 22 mmol/L (ref 22–32)
CREATININE: 0.85 mg/dL (ref 0.44–1.00)
Calcium: 9.5 mg/dL (ref 8.9–10.3)
Chloride: 98 mmol/L (ref 98–111)
GFR calc non Af Amer: >60 mL/min (ref >60)
Glucose, Bld: 106 mg/dL — ABNORMAL HIGH (ref 70–99)
Potassium: 3.2 mmol/L — ABNORMAL LOW (ref 3.5–5.1)
Sodium: 133 mmol/L — ABNORMAL LOW (ref 135–145)

## 2018-06-25 LAB — CBC
HEMATOCRIT: 29.6 % — AB (ref 36.0–46.0)
Hemoglobin: 8 g/dL — ABNORMAL LOW (ref 12.0–15.0)
MCH: 15.5 pg — ABNORMAL LOW (ref 26.0–34.0)
MCHC: 27 g/dL — ABNORMAL LOW (ref 30.0–36.0)
MCV: 57.5 fL — ABNORMAL LOW (ref 80.0–100.0)
PLATELETS: 357 10*3/uL (ref 150–400)
RBC: 5.15 MIL/uL — ABNORMAL HIGH (ref 3.87–5.11)
RDW: 21.1 % — AB (ref 11.5–15.5)
WBC: 8.8 10*3/uL (ref 4.0–10.5)
nRBC: 0 % (ref 0.0–0.2)

## 2018-06-25 LAB — I-STAT TROPONIN, ED: Troponin i, poc: 0 ng/mL (ref 0.00–0.08)

## 2018-06-25 MED ORDER — SODIUM CHLORIDE 0.9% FLUSH: 3.0000 mL | Freq: Once | INTRAVENOUS | Status: DC

## 2018-06-25 NOTE — ED Triage Notes (Signed)
Patient with chest pain, facial numbness and diarrhea that started earlier today.  She states she just does not feel right.  Patient was seen at [facility] today.

## 2018-06-25 NOTE — ED Notes (Signed)
Pt drove to the ER. Per Dr Sabra Heck.He read the EKG. He said it looked fine.

## 2018-06-25 NOTE — ED Triage Notes (Signed)
Pt cc chest pain , numbness, facial numbness and diarrhea this started today.

## 2018-06-26 ENCOUNTER — Other Ambulatory Visit: Payer: Self-pay

## 2018-06-26 NOTE — ED Notes (Signed)
ED Provider at bedside. 

## 2018-06-26 NOTE — ED Provider Notes (Signed)
[facility] EMERGENCY DEPARTMENT Provider Note   CSN: [id number] Arrival date & time: 06/25/18  2033     History   Chief Complaint Chief Complaint  Patient presents with  . Chest Pain  . Shortness of Breath    HPI Robin Fox is a 43 y.o. female.  Patient presents to the emergency department with a chief complaint of facial tingling, chest pain, and diarrhea.  She states that over the past several days she has been drinking herbal tea with hemp extract.  She states that she has been drinking more than is recommended on accident.  She states that her entire face feels numb and tingly.  She also reports that she has had some chest pain, but denies any pain now.  Denies any hx of ACS or PE.  She also reports having some heavy diarrhea.  She is also on her period.  She has a hx of anemia, but is not taking iron, though she has in the past.  The history is provided by the patient. No language interpreter was used.    Past Medical History:  Diagnosis Date  . Anemia    hx   . Gestational diabetes    Neg 2 hr test after preg  . Headache(784.0)   . Herpes genitalia    2013 - 1st outbreak  . Hypertension    2008  . Pre-eclampsia in third trimester   . Seasonal allergies   . Shortness of breath    w exertion  . Trichomonas   . Twins     Patient Active Problem List   Diagnosis Date Noted  . Tobacco use 08/01/2016  . Routine postpartum follow-up 05/22/2012  . Mild or unspecified pre-eclampsia, with delivery 04/07/2012  . Abnormal maternal glucose tolerance, antepartum 03/27/2012  . History of IUGR (intrauterine growth retardation) and stillbirth, currently pregnant 02/12/2012  . History of stillbirth 12/12/2011  . Previous cesarean delivery, antepartum condition or complication 27/74/1287  . Obesity (BMI 30-39.9) 11/21/2011  . Hx of herpes genitalis 11/21/2011  . Hypertension in pregnancy, essential, antepartum 11/09/2011  . Essential  hypertension 11/09/2011  . AMA (advanced maternal age) multigravida 35+ 11/09/2011    Past Surgical History:  Procedure Laterality Date  . CESAREAN SECTION     X 2  . CESAREAN SECTION  04/21/2012   Procedure: CESAREAN SECTION;  Surgeon: Woodroe Mode, MD;  Location: [facility];  Service: Obstetrics;  Laterality: N/A;     OB History    Gravida  4   Para  3   Term  1   Preterm  2   AB  0   Living  4     SAB  0   TAB  0   Ectopic  0   Multiple  2   Live Births  4            Home Medications    Prior to Admission medications   Medication Sig Start Date End Date Taking? Authorizing Provider  azithromycin (ZITHROMAX) 250 MG tablet Take 1 tablet (250 mg total) by mouth daily. Take first 2 tablets together, then 1 every day until finished. 05/21/18   Vanessa Kick, MD  hydrochlorothiazide (HYDRODIURIL) 25 MG tablet Take 1 tablet (25 mg total) by mouth daily. 10/23/17   [name], MD  HYDROcodone-homatropine [facility]) 5-1.5 MG/5ML syrup Take 5 mLs by mouth every 6 (six) hours as needed for cough. 05/21/18   Vanessa Kick, MD  valACYclovir (VALTREX) 500 MG  tablet Take 1 tablet (500 mg total) by mouth 2 (two) times daily. At earliest onset of outbreak x5 days 10/27/17   Fontaine, Belinda Block, MD    Family History Family History  Problem Relation Age of Onset  . Hypertension Mother   . Stroke Mother   . Stroke Maternal Grandmother   . Heart disease Maternal Grandfather 60  . Hypertension Brother     Social History Social History   Tobacco Use  . Smoking status: Current Some Day Smoker    Packs/day: 0.50    Years: 17.00    Pack years: 8.50    Types: Cigarettes    Last attempt to quit: 10/11/2011    Years since quitting: 6.7  . Smokeless tobacco: Never Used  Substance Use Topics  . Alcohol use: Yes    Comment: socially  . Drug use: No     Allergies   Penicillins   Review of Systems Review of Systems  All other systems reviewed and are  negative.    Physical Exam Updated Vital Signs BP 125/74   Pulse 65   Temp 97.7 F (36.5 C) (Oral)   Resp (!) 22   Ht 5' 7.5" (1.715 m)   SpO2 100%   BMI 34.26 kg/m   Physical Exam Vitals signs and nursing note reviewed.  Constitutional:      Appearance: She is well-developed.  HENT:     Head: Normocephalic and atraumatic.  Eyes:     Conjunctiva/sclera: Conjunctivae normal.     Pupils: Pupils are equal, round, and reactive to light.  Neck:     Musculoskeletal: Normal range of motion and neck supple.  Cardiovascular:     Rate and Rhythm: Normal rate and regular rhythm.     Heart sounds: No murmur. No friction rub. No gallop.   Pulmonary:     Effort: Pulmonary effort is normal. No respiratory distress.     Breath sounds: Normal breath sounds. No wheezing or rales.  Chest:     Chest wall: No tenderness.  Abdominal:     General: Bowel sounds are normal. There is no distension.     Palpations: Abdomen is soft. There is no mass.     Tenderness: There is no abdominal tenderness. There is no guarding or rebound.  Musculoskeletal: Normal range of motion.        General: No tenderness.     Comments: ROM and strength is 5/5  Skin:    General: Skin is warm and dry.     Comments: No rash  Neurological:     Mental Status: She is alert and oriented to person, place, and time.     Comments: CN 3-12 intact, speech is clear, no pronator drift, normal finger to nose, sensation intact throughout  Psychiatric:        Behavior: Behavior normal.        Thought Content: Thought content normal.        Judgment: Judgment normal.      ED Treatments / Results  Labs (all labs ordered are listed, but only abnormal results are displayed) Labs Reviewed  BASIC METABOLIC PANEL - Abnormal; Notable for the following components:      Result Value   Sodium 133 (*)    Potassium 3.2 (*)    Glucose, Bld 106 (*)    BUN <5 (*)    All other components within normal limits  CBC - Abnormal;  Notable for the following components:   RBC 5.15 (*)  Hemoglobin 8.0 (*)    HCT 29.6 (*)    MCV 57.5 (*)    MCH 15.5 (*)    MCHC 27.0 (*)    RDW 21.1 (*)    All other components within normal limits  I-STAT TROPONIN, ED  I-STAT BETA HCG BLOOD, ED ([facility], [facility], [facility] ONLY)    EKG EKG Interpretation  Date/Time:  Thursday June 25 2018 20:41:24 EST Ventricular Rate:  86 PR Interval:  150 QRS Duration: 86 QT Interval:  412 QTC Calculation: 493 R Axis:   35 Text Interpretation:  Normal sinus rhythm Confirmed by Randal Buba, April ([id number]) on 06/26/2018 1:13:06 AM   Radiology Dg Chest 2 View  Result Date: 06/25/2018 CLINICAL DATA:  Chest pain and facial numbness with diarrhea starting today. EXAM: CHEST - 2 VIEW COMPARISON:  10/11/2017 FINDINGS: The heart size and mediastinal contours are within normal limits. Both lungs are clear. The visualized skeletal structures are unremarkable. IMPRESSION: No active cardiopulmonary disease. Electronically Signed   By: Ashley Royalty M.D.   On: 06/25/2018 21:27    Procedures Procedures (including critical care time)  Medications Ordered in ED Medications  sodium chloride flush (NS) 0.9 % injection 3 mL (has no administration in time range)     Initial Impression / Assessment and Plan / ED Course  I have reviewed the triage vital signs and the nursing notes.  Pertinent labs & imaging results that were available during my care of the patient were reviewed by me and considered in my medical decision making (see chart for details).    Patient with multiple symptoms including diarrhea, facial tingling, and chest pain.  She states that she does feel somewhat anxious.  She has been using hemp oil/tea.  Adverse reactions of this include diarrhea.  Her potassium is mildly low.  We will have her discontinue the hemp tea, and her diarrhea should resolve along with her mild hypokalemia.  I doubt stroke, she is neurovascularly intact, sensation is intact, cranial  nerves are normal.  Advised her to resume her iron for anemia.  Otherwise, labs are reassuring. DC to home with PCP follow-up.  Final Clinical Impressions(s) / ED Diagnoses   Final diagnoses:  Diarrhea, unspecified type  Tingling of face  Chest pain, unspecified type    ED Discharge Orders    None       Montine Circle, PA-C 06/26/18 0128    Fatima Blank, MD 06/27/18 [phone]

## 2018-06-26 NOTE — Discharge Instructions (Addendum)
Please stop the hemp tea.  Take iron supplements for the next week or so to increase you blood counts.  Your hemoglobin today was lower than normal (8.0).  Your symptoms are thought to be an adverse effect of the tea.

## 2018-11-14 ENCOUNTER — Encounter (HOSPITAL_COMMUNITY): Payer: Self-pay

## 2018-11-14 ENCOUNTER — Other Ambulatory Visit: Payer: Self-pay

## 2018-11-14 ENCOUNTER — Ambulatory Visit (HOSPITAL_COMMUNITY)
Admission: EM | Admit: 2018-11-14 | Discharge: 2018-11-14 | Disposition: A | Discharge status: Home / Self Care | Payer: BC Managed Care – PPO | Attending: Internal Medicine | Admitting: Internal Medicine

## 2018-11-14 DIAGNOSIS — N390 Urinary tract infection, site not specified: Secondary | ICD-10-CM | POA: Diagnosis not present

## 2018-11-14 LAB — POCT URINALYSIS DIP (DEVICE)
Bilirubin Urine: NEGATIVE
Glucose, UA: NEGATIVE mg/dL
Ketones, ur: NEGATIVE mg/dL
Nitrite: NEGATIVE
Protein, ur: 100 mg/dL — AB
Specific Gravity, Urine: 1.025 (ref 1.005–1.030)
Urobilinogen, UA: 0.2 mg/dL (ref 0.0–1.0)
pH: 6 (ref 5.0–8.0)

## 2018-11-14 MED ORDER — CIPROFLOXACIN HCL 500 MG PO TABS: 500.0000 mg | ORAL_TABLET | Freq: Two times a day (BID) | ORAL | 0 refills | Status: DC

## 2018-11-14 NOTE — ED Triage Notes (Signed)
Pt states she has pressure when voiding and her stomach hurts x 3 days.

## 2018-11-14 NOTE — ED Provider Notes (Signed)
[place]    CSN: [id number] Arrival date & time: 11/14/18  1218      History   Chief Complaint Chief Complaint  Patient presents with  . Abdominal Pain    HPI Emmelina Mcloughlin is a 43 y.o. female with history of hypertension comes to urgent care complaints of dysuria, urgency or frequency of 2 days duration.  Patient is currently on the tail end of her menstrual period.  Flow is scant at this time.  She uses tampons.  No relieving factors.  He has some lower abdominal pain.  She denies any vaginal discharge or retained tampons.  No fever or chills.  HPI  Past Medical History:  Diagnosis Date  . Anemia    hx   . Gestational diabetes    Neg 2 hr test after preg  . Headache(784.0)   . Herpes genitalia    2013 - 1st outbreak  . Hypertension    2008  . Pre-eclampsia in third trimester   . Seasonal allergies   . Shortness of breath    w exertion  . Trichomonas   . Twins     Patient Active Problem List   Diagnosis Date Noted  . Tobacco use 08/01/2016  . Routine postpartum follow-up 05/22/2012  . Mild or unspecified pre-eclampsia, with delivery 04/07/2012  . Abnormal maternal glucose tolerance, antepartum 03/27/2012  . History of IUGR (intrauterine growth retardation) and stillbirth, currently pregnant 02/12/2012  . History of stillbirth 12/12/2011  . Previous cesarean delivery, antepartum condition or complication 41/66/0630  . Obesity (BMI 30-39.9) 11/21/2011  . Hx of herpes genitalis 11/21/2011  . Hypertension in pregnancy, essential, antepartum 11/09/2011  . Essential hypertension 11/09/2011  . AMA (advanced maternal age) multigravida 35+ 11/09/2011    Past Surgical History:  Procedure Laterality Date  . CESAREAN SECTION     X 2  . CESAREAN SECTION  04/21/2012   Procedure: CESAREAN SECTION;  Surgeon: Woodroe Mode, MD;  Location: [facility];  Service: Obstetrics;  Laterality: N/A;    OB History    Gravida  4   Para  3   Term  1   Preterm  2   AB  0   Living  4     SAB  0   TAB  0   Ectopic  0   Multiple  2   Live Births  4            Home Medications    Prior to Admission medications   Medication Sig Start Date End Date Taking? Authorizing Provider  azithromycin (ZITHROMAX) 250 MG tablet Take 1 tablet (250 mg total) by mouth daily. Take first 2 tablets together, then 1 every day until finished. 05/21/18   Vanessa Kick, MD  hydrochlorothiazide (HYDRODIURIL) 25 MG tablet Take 1 tablet (25 mg total) by mouth daily. 10/23/17   [name], MD  HYDROcodone-homatropine [facility]) 5-1.5 MG/5ML syrup Take 5 mLs by mouth every 6 (six) hours as needed for cough. 05/21/18   Vanessa Kick, MD  valACYclovir (VALTREX) 500 MG tablet Take 1 tablet (500 mg total) by mouth 2 (two) times daily. At earliest onset of outbreak x5 days 10/27/17   Fontaine, Belinda Block, MD    Family History Family History  Problem Relation Age of Onset  . Hypertension Mother   . Stroke Mother   . Stroke Maternal Grandmother   . Heart disease Maternal Grandfather 60  . Hypertension Brother     Social History Social History  Tobacco Use  . Smoking status: Current Some Day Smoker    Packs/day: 0.50    Years: 17.00    Pack years: 8.50    Types: Cigarettes    Last attempt to quit: 10/11/2011    Years since quitting: 7.0  . Smokeless tobacco: Never Used  Substance Use Topics  . Alcohol use: Yes    Comment: socially  . Drug use: No     Allergies   Penicillins   Review of Systems Review of Systems  Constitutional: Positive for activity change. Negative for appetite change, chills, fatigue and fever.  Gastrointestinal: Negative for abdominal pain, diarrhea, nausea and vomiting.  Endocrine: Negative.   Genitourinary: Positive for difficulty urinating, dyspareunia, dysuria, frequency and urgency. Negative for decreased urine volume, genital sores, vaginal bleeding, vaginal discharge and vaginal pain.  Musculoskeletal:  Negative.  Negative for arthralgias and joint swelling.  Neurological: Negative for dizziness, weakness, light-headedness, numbness and headaches.     Physical Exam Triage Vital Signs ED Triage Vitals  Enc Vitals Group     BP 11/14/18 1311 125/86     Pulse Rate 11/14/18 1311 75     Resp 11/14/18 1311 18     Temp 11/14/18 1311 98.3 F (36.8 C)     Temp Source 11/14/18 1311 Oral     SpO2 11/14/18 1311 100 %     Weight 11/14/18 1310 220 lb (99.8 kg)     Height --      Head Circumference --      Peak Flow --      Pain Score 11/14/18 1310 7     Pain Loc --      Pain Edu? --      Excl. in [place]? --    No data found.  Updated Vital Signs BP 125/86 (BP Location: Right Arm)   Pulse 75   Temp 98.3 F (36.8 C) (Oral)   Resp 18   Wt 99.8 kg   LMP 11/08/2018   SpO2 100%   BMI 33.95 kg/m   Visual Acuity Right Eye Distance:   Left Eye Distance:   Bilateral Distance:    Right Eye Near:   Left Eye Near:    Bilateral Near:     Physical Exam Pulmonary:     Effort: Pulmonary effort is normal.     Breath sounds: Normal breath sounds.  Abdominal:     General: Bowel sounds are normal.     Palpations: Abdomen is rigid. There is no shifting dullness, hepatomegaly or mass.     Tenderness: There is no abdominal tenderness.     Hernia: No hernia is present.  Skin:    General: Skin is warm.     Coloration: Skin is not cyanotic or mottled.      UC Treatments / Results  Labs (all labs ordered are listed, but only abnormal results are displayed) Labs Reviewed  POCT URINALYSIS DIP (DEVICE) - Abnormal; Notable for the following components:      Result Value   Hgb urine dipstick LARGE (*)    Protein, ur 100 (*)    Leukocytes,Ua LARGE (*)    All other components within normal limits    EKG None  Radiology No results found.  Procedures Procedures (including critical care time)  Medications Ordered in UC Medications - No data to display  Initial Impression / Assessment  and Plan / UC Course  I have reviewed the triage vital signs and the nursing notes.  Pertinent labs & imaging results  that were available during my care of the patient were reviewed by me and considered in my medical decision making (see chart for details).     1.  Urinary tract infection: Urinalysis is positive for leukocyte esterase, hemoglobin, protein.  This is indicative of urinary tract infection Ciprofloxacin 500 mg twice daily for 5 days Urine cultures will be sent Patient is advised to return to urgent care if she develops any fever chills confusion or worsening urinary symptoms. Final Clinical Impressions(s) / UC Diagnoses   Final diagnoses:  None   Discharge Instructions   None    ED Prescriptions    None     Controlled Substance Prescriptions [place] Controlled Substance Registry consulted? No   Chase Picket, MD 11/14/18 [phone]

## 2018-11-16 LAB — URINE CULTURE: Culture: <10000 — AB

## 2019-01-04 ENCOUNTER — Telehealth: Payer: Self-pay | Admitting: Family Medicine

## 2019-01-04 ENCOUNTER — Other Ambulatory Visit: Payer: Self-pay

## 2019-01-04 DIAGNOSIS — I10 Essential (primary) hypertension: Secondary | ICD-10-CM

## 2019-01-04 MED ORDER — HYDROCHLOROTHIAZIDE 25 MG PO TABS: 25.0000 mg | ORAL_TABLET | Freq: Every day | ORAL | 0 refills | Status: DC

## 2019-01-04 NOTE — Telephone Encounter (Signed)
Pt is needing a med refill of her hydrochlorothizazide. Was last seen 09/2017. I told her she needed an appt to get more refills and so I set her up an appt with Dr. Carlota Raspberry on 01/20/2019. She states she is completely out. She wants to update her pharmacy to [facility] on [address].

## 2019-01-04 NOTE — Telephone Encounter (Signed)
Rx sent 

## 2019-01-20 ENCOUNTER — Other Ambulatory Visit: Payer: Self-pay

## 2019-01-20 ENCOUNTER — Ambulatory Visit (INDEPENDENT_AMBULATORY_CARE_PROVIDER_SITE_OTHER): Payer: BC Managed Care – PPO | Admitting: Family Medicine

## 2019-01-20 ENCOUNTER — Encounter: Payer: Self-pay | Admitting: Family Medicine

## 2019-01-20 VITALS — BP 121/83 | HR 96 | Temp 99.3°F | Resp 14 | Wt 221.4 lb

## 2019-01-20 DIAGNOSIS — D649 Anemia, unspecified: Secondary | ICD-10-CM

## 2019-01-20 DIAGNOSIS — Z13220 Encounter for screening for lipoid disorders: Secondary | ICD-10-CM

## 2019-01-20 DIAGNOSIS — E876 Hypokalemia: Secondary | ICD-10-CM

## 2019-01-20 DIAGNOSIS — A6000 Herpesviral infection of urogenital system, unspecified: Secondary | ICD-10-CM

## 2019-01-20 DIAGNOSIS — I10 Essential (primary) hypertension: Secondary | ICD-10-CM

## 2019-01-20 MED ORDER — HYDROCHLOROTHIAZIDE 25 MG PO TABS: 25.0000 mg | ORAL_TABLET | Freq: Every day | ORAL | 2 refills | Status: DC

## 2019-01-20 MED ORDER — VALACYCLOVIR HCL 500 MG PO TABS: 500.0000 mg | ORAL_TABLET | Freq: Every day | ORAL | 3 refills | Status: DC

## 2019-01-20 NOTE — Progress Notes (Signed)
Subjective:    Patient ID: Robin Fox, female    DOB: 1976-04-10, 43 y.o.   MRN: [id number]  HPI Robin Fox is a 43 y.o. female Presents today for: Chief Complaint  Patient presents with  . Hypertension    Here for medication refill   Hypertension: BP Readings from Last 3 Encounters:  01/20/19 121/83  11/14/18 125/86  06/26/18 111/68   Lab Results  Component Value Date   CREATININE 0.85 06/25/2018  last seen May 2019.  Stable at HCTZ 25 mg daily at that time. Prior hypokalemia.  Most recent reading of 3.2 in January, diarrheal illness at that time.  Recommended discontinuation of hemp tea. Still on hctz 25mg  qd, no new side effects - some dizzy if in sun too long - resolves with cooling off. No home readings.   Lab Results  Component Value Date   NA 133 (L) 06/25/2018   K 3.2 (L) 06/25/2018   CL 98 06/25/2018   CO2 22 06/25/2018   Lipid screen: Lab Results  Component Value Date   CHOL 156 09/12/2017   HDL 43 09/12/2017   LDLCALC 88 09/12/2017   TRIG 125 09/12/2017   CHOLHDL 3.6 09/12/2017   Genital HSV, and hx of cold sores.  Last cold sore over a year.  Genital hsv - 82months ago,  4 per year.   Anemia: History of iron deficiency anemia, hemoglobin range of 9.3-10.3 from April 2018 to May 2019.  Low reading of 8.0 in January, ER provider recommended restart of iron supplementation and follow-up with me.  Lab Results  Component Value Date   WBC 8.8 06/25/2018   HGB 8.0 (L) 06/25/2018   HCT 29.6 (L) 06/25/2018   MCV 57.5 (L) 06/25/2018   PLT 357 06/25/2018   Anovulation/late menses discussed with OB/GYN in September 2019, intermittent progesterone withdrawal pain. New OBGYN - on ocp's now - less heavy bleeding - only taking as needed. Feels bleeding less.  Still on iron supplement - takes about 2 times per week (constipation).  No recent lightheadedness.    Patient Active Problem List   Diagnosis Date Noted  . Tobacco  use 08/01/2016  . Routine postpartum follow-up 05/22/2012  . Mild or unspecified pre-eclampsia, with delivery 04/07/2012  . Abnormal maternal glucose tolerance, antepartum 03/27/2012  . History of IUGR (intrauterine growth retardation) and stillbirth, currently pregnant 02/12/2012  . History of stillbirth 12/12/2011  . Previous cesarean delivery, antepartum condition or complication 0000000  . Obesity (BMI 30-39.9) 11/21/2011  . Hx of herpes genitalis 11/21/2011  . Hypertension in pregnancy, essential, antepartum 11/09/2011  . Essential hypertension 11/09/2011  . AMA (advanced maternal age) multigravida 35+ 11/09/2011   Past Medical History:  Diagnosis Date  . Anemia    hx   . Gestational diabetes    Neg 2 hr test after preg  . Headache(784.0)   . Herpes genitalia    2013 - 1st outbreak  . Hypertension    2008  . Pre-eclampsia in third trimester   . Seasonal allergies   . Shortness of breath    w exertion  . Trichomonas   . Twins    Past Surgical History:  Procedure Laterality Date  . CESAREAN SECTION     X 2  . CESAREAN SECTION  04/21/2012   Procedure: CESAREAN SECTION;  Surgeon: Woodroe Mode, MD;  Location: [facility];  Service: Obstetrics;  Laterality: N/A;   Allergies  Allergen Reactions  . Penicillins Hives, Shortness Of Breath and  Swelling    Has patient had a PCN reaction causing immediate rash, facial/tongue/throat swelling, SOB or lightheadedness with hypotension: no Has patient had a PCN reaction causing severe rash involving mucus membranes or skin necrosis:no Has patient had a PCN reaction that required hospitalization no Has patient had a PCN reaction occurring within the last 10 years: {Yes If all of the above answers are "NO", then may proceed with Cephalosporin use.   Prior to Admission medications   Medication Sig Start Date End Date Taking? Authorizing Provider  hydrochlorothiazide (HYDRODIURIL) 25 MG tablet Take 1 tablet (25 mg total) by mouth  daily. 01/04/19  Yes [name], MD  valACYclovir (VALTREX) 500 MG tablet Take 1 tablet (500 mg total) by mouth 2 (two) times daily. At earliest onset of outbreak x5 days 10/27/17  Yes Fontaine, Belinda Block, MD   Social History   Socioeconomic History  . Marital status: Married    Spouse name: Not on file  . Number of children: Not on file  . Years of education: Not on file  . Highest education level: Not on file  Occupational History  . Not on file  Social Needs  . Financial resource strain: Not on file  . Food insecurity    Worry: Not on file    Inability: Not on file  . Transportation needs    Medical: Not on file    Non-medical: Not on file  Tobacco Use  . Smoking status: Current Some Day Smoker    Packs/day: 0.50    Years: 17.00    Pack years: 8.50    Types: Cigarettes    Last attempt to quit: 10/11/2011    Years since quitting: 7.2  . Smokeless tobacco: Never Used  Substance and Sexual Activity  . Alcohol use: Yes    Comment: socially  . Drug use: No  . Sexual activity: Yes    Birth control/protection: Other-see comments    Comment: Partner had vasectomy-1st intercourse 29 yo-More than 5 partners  Lifestyle  . Physical activity    Days per week: Not on file    Minutes per session: Not on file  . Stress: Not on file  Relationships  . Social Herbalist on phone: Not on file    Gets together: Not on file    Attends religious service: Not on file    Active member of club or organization: Not on file    Attends meetings of clubs or organizations: Not on file    Relationship status: Not on file  . Intimate partner violence    Fear of current or ex partner: Not on file    Emotionally abused: Not on file    Physically abused: Not on file    Forced sexual activity: Not on file  Other Topics Concern  . Not on file  Social History Narrative  . Not on file    Review of Systems  Constitutional: Negative for fatigue and unexpected weight change.   Respiratory: Negative for chest tightness and shortness of breath.   Cardiovascular: Negative for chest pain, palpitations and leg swelling.  Gastrointestinal: Negative for abdominal pain and blood in stool.  Neurological: Negative for dizziness, syncope, light-headedness and headaches.       Objective:   Physical Exam Vitals signs reviewed.  Constitutional:      Appearance: She is well-developed.  HENT:     Head: Normocephalic and atraumatic.  Eyes:     Conjunctiva/sclera: Conjunctivae normal.  Pupils: Pupils are equal, round, and reactive to light.  Neck:     Vascular: No carotid bruit.  Cardiovascular:     Rate and Rhythm: Normal rate and regular rhythm.     Heart sounds: Normal heart sounds.  Pulmonary:     Effort: Pulmonary effort is normal.     Breath sounds: Normal breath sounds.  Abdominal:     Palpations: Abdomen is soft. There is no pulsatile mass.     Tenderness: There is no abdominal tenderness.  Skin:    General: Skin is warm and dry.  Neurological:     Mental Status: She is alert and oriented to person, place, and time.  Psychiatric:        Behavior: Behavior normal.    Vitals:   01/20/19 1358  BP: 121/83  Pulse: 96  Resp: 14  Temp: 99.3 F (37.4 C)  TempSrc: Oral  SpO2: 99%  Weight: 221 lb 6.4 oz (100.4 kg)          Assessment & Plan:   Jullissa Rothweiler is a 43 y.o. female Essential hypertension - Plan: hydrochlorothiazide (HYDRODIURIL) 25 MG tablet  -  Stable, tolerating current regimen. Medications refilled. Labs pending as above.   -Symptoms in the sun were likely related to heat, not HCTZ.  Maintenance of hydration and heat illness prevention discussed  Hypokalemia  -Discussed potassium rich foods, potentially may need supplementation, check electrolytes. Anemia, unspecified type - Plan: CBC  Screening for hyperlipidemia - Plan: Comprehensive metabolic panel, Lipid panel   Genital herpes simplex, unspecified site -  Plan: valACYclovir (VALTREX) 500 MG tablet  -Decided to try daily use to minimize outbreaks, twice daily dosing discussed at time of outbreak.  Meds ordered this encounter  Medications  . hydrochlorothiazide (HYDRODIURIL) 25 MG tablet    Sig: Take 1 tablet (25 mg total) by mouth daily.    Dispense:  90 tablet    Refill:  2  . valACYclovir (VALTREX) 500 MG tablet    Sig: Take 1 tablet (500 mg total) by mouth daily. Increase to twice per day for 3 days during outbreak.    Dispense:  90 tablet    Refill:  3   Patient Instructions   Continue same dose of hydrochlorothiazide for now.  Potassium rich foods in the diet or a potassium supplement once a day may be needed but I will look at those results and let you know.  Valtrex was refilled for daily use that may help lessen the frequency of outbreaks.  If you do have a genital herpes outbreak, increase dosing to twice per day for 3 days, then return to once per day.  Continue iron supplement, but I will check those levels today.  Recheck blood pressure in the next 6 months.  Let me know if there are questions or concerns prior to that time.  Thank you for coming in today and stay safe.    If you have lab work done today you will be contacted with your lab results within the next 2 weeks.  If you have not heard from [place] then please contact us. The fastest way to get your results is to register for My Chart.   IF you received an x-ray today, you will receive an invoice from [facility]. Please contact [facility] at [phone] with questions or concerns regarding your invoice.   IF you received labwork today, you will receive an invoice from [place]. Please contact [facility] at [phone] with questions or concerns  regarding your invoice.   Our billing staff will not be able to assist you with questions regarding bills from these companies.  You will be contacted with the lab results as soon as they are available. The  fastest way to get your results is to activate your My Chart account. Instructions are located on the last page of this paperwork. If you have not heard from [place] regarding the results in 2 weeks, please contact this office.    Signed,   Merri Ray, MD [facility].  01/20/19 2:29 PM

## 2019-01-20 NOTE — Patient Instructions (Addendum)
Continue same dose of hydrochlorothiazide for now.  Potassium rich foods in the diet or a potassium supplement once a day may be needed but I will look at those results and let you know.  Valtrex was refilled for daily use that may help lessen the frequency of outbreaks.  If you do have a genital herpes outbreak, increase dosing to twice per day for 3 days, then return to once per day.  Continue iron supplement, but I will check those levels today.  Recheck blood pressure in the next 6 months.  Let me know if there are questions or concerns prior to that time.  Thank you for coming in today and stay safe.    If you have lab work done today you will be contacted with your lab results within the next 2 weeks.  If you have not heard from [place] then please contact us. The fastest way to get your results is to register for My Chart.   IF you received an x-ray today, you will receive an invoice from [facility]. Please contact [facility] at [phone] with questions or concerns regarding your invoice.   IF you received labwork today, you will receive an invoice from [place]. Please contact [facility] at [phone] with questions or concerns regarding your invoice.   Our billing staff will not be able to assist you with questions regarding bills from these companies.  You will be contacted with the lab results as soon as they are available. The fastest way to get your results is to activate your My Chart account. Instructions are located on the last page of this paperwork. If you have not heard from [place] regarding the results in 2 weeks, please contact this office.

## 2019-01-21 LAB — COMPREHENSIVE METABOLIC PANEL
ALT: 8 IU/L (ref 0–32)
AST: 15 IU/L (ref 0–40)
Albumin/Globulin Ratio: 1.6 (ref 1.2–2.2)
Albumin: 4.6 g/dL (ref 3.8–4.8)
Alkaline Phosphatase: 58 IU/L (ref 39–117)
BUN/Creatinine Ratio: 10 (ref 9–23)
BUN: 9 mg/dL (ref 6–24)
Bilirubin Total: 0.5 mg/dL (ref 0.0–1.2)
CO2: 24 mmol/L (ref 20–29)
Calcium: 10.1 mg/dL (ref 8.7–10.2)
Chloride: 100 mmol/L (ref 96–106)
Creatinine, Ser: 0.93 mg/dL (ref 0.57–1.00)
GFR calc Af Amer: 88 mL/min/{1.73_m2} (ref >59)
GFR calc non Af Amer: 76 mL/min/{1.73_m2} (ref >59)
Globulin, Total: 2.9 g/dL (ref 1.5–4.5)
Glucose: 115 mg/dL — ABNORMAL HIGH (ref 65–99)
Potassium: 4.1 mmol/L (ref 3.5–5.2)
Sodium: 139 mmol/L (ref 134–144)
Total Protein: 7.5 g/dL (ref 6.0–8.5)

## 2019-01-21 LAB — CBC
Hematocrit: 40.4 % (ref 34.0–46.6)
Hemoglobin: 12 g/dL (ref 11.1–15.9)
MCH: 19.7 pg — ABNORMAL LOW (ref 26.6–33.0)
MCHC: 29.7 g/dL — ABNORMAL LOW (ref 31.5–35.7)
MCV: 66 fL — ABNORMAL LOW (ref 79–97)
Platelets: 462 10*3/uL — ABNORMAL HIGH (ref 150–450)
RBC: 6.1 x10E6/uL — ABNORMAL HIGH (ref 3.77–5.28)
RDW: 20 % — ABNORMAL HIGH (ref 11.7–15.4)
WBC: 8.4 10*3/uL (ref 3.4–10.8)

## 2019-01-21 LAB — LIPID PANEL
Chol/HDL Ratio: 4 ratio (ref 0.0–4.4)
Cholesterol, Total: 182 mg/dL (ref 100–199)
HDL: 46 mg/dL (ref >39)
LDL Calculated: 102 mg/dL — ABNORMAL HIGH (ref 0–99)
Triglycerides: 170 mg/dL — ABNORMAL HIGH (ref 0–149)
VLDL Cholesterol Cal: 34 mg/dL (ref 5–40)

## 2019-02-07 ENCOUNTER — Emergency Department (HOSPITAL_COMMUNITY)
Admission: EM | Admit: 2019-02-07 | Discharge: 2019-02-08 | Discharge status: Against Medical Advice | Payer: BC Managed Care – PPO | Attending: Emergency Medicine | Admitting: Emergency Medicine

## 2019-02-07 ENCOUNTER — Encounter (HOSPITAL_COMMUNITY): Payer: Self-pay | Admitting: Emergency Medicine

## 2019-02-07 ENCOUNTER — Other Ambulatory Visit: Payer: Self-pay

## 2019-02-07 DIAGNOSIS — Z5321 Procedure and treatment not carried out due to patient leaving prior to being seen by health care provider: Secondary | ICD-10-CM | POA: Insufficient documentation

## 2019-02-07 LAB — TROPONIN I (HIGH SENSITIVITY): Troponin I (High Sensitivity): 4 ng/L (ref <18)

## 2019-02-07 LAB — BASIC METABOLIC PANEL
Anion gap: 12 (ref 5–15)
BUN: 10 mg/dL (ref 6–20)
CO2: 23 mmol/L (ref 22–32)
Calcium: 9.7 mg/dL (ref 8.9–10.3)
Chloride: 98 mmol/L (ref 98–111)
Creatinine, Ser: 0.83 mg/dL (ref 0.44–1.00)
GFR calc Af Amer: >60 mL/min (ref >60)
GFR calc non Af Amer: >60 mL/min (ref >60)
Glucose, Bld: 102 mg/dL — ABNORMAL HIGH (ref 70–99)
Potassium: 3.8 mmol/L (ref 3.5–5.1)
Sodium: 133 mmol/L — ABNORMAL LOW (ref 135–145)

## 2019-02-07 LAB — CBC
HCT: 38.9 % (ref 36.0–46.0)
Hemoglobin: 11.4 g/dL — ABNORMAL LOW (ref 12.0–15.0)
MCH: 19.9 pg — ABNORMAL LOW (ref 26.0–34.0)
MCHC: 29.3 g/dL — ABNORMAL LOW (ref 30.0–36.0)
MCV: 67.8 fL — ABNORMAL LOW (ref 80.0–100.0)
Platelets: 404 10*3/uL — ABNORMAL HIGH (ref 150–400)
RBC: 5.74 MIL/uL — ABNORMAL HIGH (ref 3.87–5.11)
RDW: 20 % — ABNORMAL HIGH (ref 11.5–15.5)
WBC: 9.8 10*3/uL (ref 4.0–10.5)
nRBC: 0 % (ref 0.0–0.2)

## 2019-02-07 LAB — I-STAT BETA HCG BLOOD, ED (MC, WL, AP ONLY): I-stat hCG, quantitative: <5 m[IU]/mL (ref <5)

## 2019-02-07 NOTE — ED Triage Notes (Addendum)
C/o feeling nervous and having "jumping sensation" to bilateral breast, abd, legs, and eyes x 5 days.  Also request for doctor to look at her mouth where she had tooth extraction 6 weeks ago and states that the socket is still open partially.

## 2019-02-08 ENCOUNTER — Other Ambulatory Visit (HOSPITAL_COMMUNITY): Payer: Self-pay | Admitting: *Deleted

## 2019-02-08 DIAGNOSIS — N644 Mastodynia: Secondary | ICD-10-CM

## 2019-02-08 NOTE — ED Notes (Signed)
Pt called x2 for room with no answer.

## 2019-02-08 NOTE — ED Notes (Signed)
Pt called x3 in the waiting room. No reply. 

## 2019-02-16 ENCOUNTER — Encounter: Payer: Self-pay | Admitting: Gynecology

## 2019-02-25 ENCOUNTER — Ambulatory Visit (HOSPITAL_COMMUNITY)
Admission: EM | Admit: 2019-02-25 | Discharge: 2019-02-25 | Disposition: A | Discharge status: Home / Self Care | Payer: Self-pay | Attending: Family Medicine | Admitting: Family Medicine

## 2019-02-25 ENCOUNTER — Other Ambulatory Visit: Payer: Self-pay | Admitting: Family Medicine

## 2019-02-25 ENCOUNTER — Encounter (HOSPITAL_COMMUNITY): Payer: Self-pay | Admitting: Emergency Medicine

## 2019-02-25 ENCOUNTER — Other Ambulatory Visit: Payer: Self-pay

## 2019-02-25 ENCOUNTER — Ambulatory Visit (INDEPENDENT_AMBULATORY_CARE_PROVIDER_SITE_OTHER): Payer: Self-pay

## 2019-02-25 DIAGNOSIS — R062 Wheezing: Secondary | ICD-10-CM | POA: Insufficient documentation

## 2019-02-25 DIAGNOSIS — R05 Cough: Secondary | ICD-10-CM

## 2019-02-25 DIAGNOSIS — Z79899 Other long term (current) drug therapy: Secondary | ICD-10-CM | POA: Insufficient documentation

## 2019-02-25 DIAGNOSIS — F17210 Nicotine dependence, cigarettes, uncomplicated: Secondary | ICD-10-CM | POA: Insufficient documentation

## 2019-02-25 DIAGNOSIS — Z8249 Family history of ischemic heart disease and other diseases of the circulatory system: Secondary | ICD-10-CM | POA: Insufficient documentation

## 2019-02-25 DIAGNOSIS — I10 Essential (primary) hypertension: Secondary | ICD-10-CM | POA: Insufficient documentation

## 2019-02-25 DIAGNOSIS — R0602 Shortness of breath: Secondary | ICD-10-CM | POA: Insufficient documentation

## 2019-02-25 DIAGNOSIS — Z880 Allergy status to penicillin: Secondary | ICD-10-CM | POA: Insufficient documentation

## 2019-02-25 DIAGNOSIS — R059 Cough, unspecified: Secondary | ICD-10-CM

## 2019-02-25 DIAGNOSIS — Z20828 Contact with and (suspected) exposure to other viral communicable diseases: Secondary | ICD-10-CM | POA: Insufficient documentation

## 2019-02-25 DIAGNOSIS — R0781 Pleurodynia: Secondary | ICD-10-CM | POA: Insufficient documentation

## 2019-02-25 MED ORDER — BENZONATATE 100 MG PO CAPS: 100.0000 mg | ORAL_CAPSULE | Freq: Three times a day (TID) | ORAL | 0 refills | Status: DC

## 2019-02-25 MED ORDER — ALBUTEROL SULFATE HFA 108 (90 BASE) MCG/ACT IN AERS: 1.0000 | INHALATION_SPRAY | Freq: Four times a day (QID) | RESPIRATORY_TRACT | 0 refills | Status: DC | PRN

## 2019-02-25 NOTE — ED Triage Notes (Signed)
Bilateral sides, rib cage pain with breathing .  Patient complains of sob.  Patient is coughing, but non-productive. Denies fever.

## 2019-02-25 NOTE — Discharge Instructions (Signed)
Your x-ray did not show any bronchitis or pneumonia. Lungs were clear on exam and your vital signs are normal. Nothing concerning today.  This could be some sort of viral illness we will send your COVID swab for testing and call you if the results are positive I will prescribe some Tessalon Perles for cough and  albuterol inhaler to use as needed If your symptoms worsen you need to follow-up for recheck

## 2019-02-25 NOTE — ED Provider Notes (Signed)
[facility]    CSN: [id number] Arrival date & time: 02/25/19  1024      History   Chief Complaint Chief Complaint  Patient presents with  . Shortness of Breath    HPI Robin Fox is a 43 y.o. female.   Patient is a 43 year old female with past medical history of anemia, seasonal allergies, hypertension, headache, shortness of breath.  She presents today with approximately 3 to 4 days of shortness of breath, bilateral rib pain with breathing and dry nonproductive cough.  Symptoms have been constant.  She has been using her child's albuterol with some relief.  Reporting that she can hear wheezing at times.  Does have a history of allergies.  She is not currently take any medication.  No recent traveling or history of DVT/PE.  No family history.  No calf pain or swelling.  No fever, rhinorrhea, sore throat or ear pain. Current smoker.   ROS per HPI      Past Medical History:  Diagnosis Date  . Anemia    hx   . Gestational diabetes    Neg 2 hr test after preg  . Headache(784.0)   . Herpes genitalia    2013 - 1st outbreak  . Hypertension    2008  . Pre-eclampsia in third trimester   . Seasonal allergies   . Shortness of breath    w exertion  . Trichomonas   . Twins     Patient Active Problem List   Diagnosis Date Noted  . Tobacco use 08/01/2016  . Routine postpartum follow-up 05/22/2012  . Mild or unspecified pre-eclampsia, with delivery 04/07/2012  . Abnormal maternal glucose tolerance, antepartum 03/27/2012  . History of IUGR (intrauterine growth retardation) and stillbirth, currently pregnant 02/12/2012  . History of stillbirth 12/12/2011  . Previous cesarean delivery, antepartum condition or complication 0000000  . Obesity (BMI 30-39.9) 11/21/2011  . Hx of herpes genitalis 11/21/2011  . Hypertension in pregnancy, essential, antepartum 11/09/2011  . Essential hypertension 11/09/2011  . AMA (advanced maternal age) multigravida 35+  11/09/2011    Past Surgical History:  Procedure Laterality Date  . CESAREAN SECTION     X 2  . CESAREAN SECTION  04/21/2012   Procedure: CESAREAN SECTION;  Surgeon: Woodroe Mode, MD;  Location: [facility];  Service: Obstetrics;  Laterality: N/A;  . TOOTH EXTRACTION      OB History    Gravida  4   Para  3   Term  1   Preterm  2   AB  0   Living  4     SAB  0   TAB  0   Ectopic  0   Multiple  2   Live Births  4            Home Medications    Prior to Admission medications   Medication Sig Start Date End Date Taking? Authorizing Provider  ferrous sulfate 325 (65 FE) MG tablet Take 325 mg by mouth daily with breakfast.   Yes [provider]  hydrochlorothiazide (HYDRODIURIL) 25 MG tablet Take 1 tablet (25 mg total) by mouth daily. 01/20/19  Yes [name], MD  Multiple Vitamin (MULTIVITAMIN) tablet Take 1 tablet by mouth daily.   Yes [provider]  vitamin C (ASCORBIC ACID) 500 MG tablet Take 500 mg by mouth daily.   Yes [provider]  albuterol (VENTOLIN HFA) 108 (90 Base) MCG/ACT inhaler Inhale 1-2 puffs into the lungs every 6 (  six) hours as needed for wheezing or shortness of breath. 02/25/19   Edyn Popoca, Tressia Miners A, NP  benzonatate (TESSALON) 100 MG capsule Take 1 capsule (100 mg total) by mouth every 8 (eight) hours. 02/25/19   Loura Halt A, NP  valACYclovir (VALTREX) 500 MG tablet Take 1 tablet (500 mg total) by mouth daily. Increase to twice per day for 3 days during outbreak. 01/20/19   [name], MD    Family History Family History  Problem Relation Age of Onset  . Hypertension Mother   . Stroke Mother   . Stroke Maternal Grandmother   . Heart disease Maternal Grandfather 60  . Hypertension Brother     Social History Social History   Tobacco Use  . Smoking status: Current Some Day Smoker    Packs/day: 0.50    Years: 17.00    Pack years: 8.50    Types: Cigarettes    Last attempt to quit: 10/11/2011    Years  since quitting: 7.3  . Smokeless tobacco: Never Used  Substance Use Topics  . Alcohol use: Yes    Comment: socially  . Drug use: No     Allergies   Penicillins   Review of Systems Review of Systems   Physical Exam Triage Vital Signs ED Triage Vitals  Enc Vitals Group     BP 02/25/19 1129 132/80     Pulse Rate 02/25/19 1129 70     Resp 02/25/19 1129 18     Temp 02/25/19 1129 97.8 F (36.6 C)     Temp Source 02/25/19 1129 Skin     SpO2 02/25/19 1129 100 %     Weight --      Height --      Head Circumference --      Peak Flow --      Pain Score 02/25/19 1122 8     Pain Loc --      Pain Edu? --      Excl. in [place]? --    No data found.  Updated Vital Signs BP 132/80 (BP Location: Right Arm)   Pulse 70   Temp 97.8 F (36.6 C) (Skin)   Resp 18   LMP 02/17/2019 (Exact Date)   SpO2 100%   Visual Acuity Right Eye Distance:   Left Eye Distance:   Bilateral Distance:    Right Eye Near:   Left Eye Near:    Bilateral Near:     Physical Exam Vitals signs and nursing note reviewed.  Constitutional:      General: She is not in acute distress.    Appearance: She is well-developed. She is not ill-appearing, toxic-appearing or diaphoretic.  HENT:     Head: Normocephalic and atraumatic.  Neck:     Musculoskeletal: Normal range of motion.  Cardiovascular:     Rate and Rhythm: Normal rate and regular rhythm.  Pulmonary:     Effort: Pulmonary effort is normal.     Breath sounds: Normal breath sounds.  Chest:     Chest wall: No tenderness.  Abdominal:     Palpations: Abdomen is soft.     Tenderness: There is no abdominal tenderness.  Musculoskeletal: Normal range of motion.     Right lower leg: She exhibits no tenderness. No edema.     Left lower leg: She exhibits no tenderness. No edema.  Skin:    General: Skin is warm and dry.  Neurological:     General: No focal deficit present.  Mental Status: She is alert.  Psychiatric:        Mood and Affect: Mood  normal.      UC Treatments / Results  Labs (all labs ordered are listed, but only abnormal results are displayed) Labs Reviewed  NOVEL CORONAVIRUS, NAA (HOSP ORDER, SEND-OUT TO REF LAB; TAT 18-24 HRS)    EKG   Radiology Dg Chest 2 View  Result Date: 02/25/2019 CLINICAL DATA:  Cough, shortness of breath EXAM: CHEST - 2 VIEW COMPARISON:  06/25/2018 FINDINGS: Heart and mediastinal contours are within normal limits. No focal opacities or effusions. No acute bony abnormality. IMPRESSION: No active cardiopulmonary disease. Electronically Signed   By: Rolm Baptise M.D.   On: 02/25/2019 12:23    Procedures Procedures (including critical care time)  Medications Ordered in UC Medications - No data to display  Initial Impression / Assessment and Plan / UC Course  I have reviewed the triage vital signs and the nursing notes.  Pertinent labs & imaging results that were available during my care of the patient were reviewed by me and considered in my medical decision making (see chart for details).     Cough/SOB/pain with breathing. Chest x ray normal.  Most likely viral related, could be COVID COVID test pending.  Return precautions given.  Tessalon pearls for cough and albuterol for cough, wheezing, SOB.   Final Clinical Impressions(s) / UC Diagnoses   Final diagnoses:  Shortness of breath  Cough     Discharge Instructions     Your x-ray did not show any bronchitis or pneumonia. Lungs were clear on exam and your vital signs are normal. Nothing concerning today.  This could be some sort of viral illness we will send your COVID swab for testing and call you if the results are positive I will prescribe some Tessalon Perles for cough and  albuterol inhaler to use as needed If your symptoms worsen you need to follow-up for recheck    ED Prescriptions    Medication Sig Dispense Auth. Provider   benzonatate (TESSALON) 100 MG capsule Take 1 capsule (100 mg total) by mouth  every 8 (eight) hours. 21 capsule Unice Vantassel A, NP   albuterol (VENTOLIN HFA) 108 (90 Base) MCG/ACT inhaler Inhale 1-2 puffs into the lungs every 6 (six) hours as needed for wheezing or shortness of breath. 18 g Loura Halt A, NP     PDMP not reviewed this encounter.   Orvan July, NP 02/25/19 1248

## 2019-02-27 LAB — NOVEL CORONAVIRUS, NAA (HOSP ORDER, SEND-OUT TO REF LAB; TAT 18-24 HRS): SARS-CoV-2, NAA: NOT DETECTED

## 2019-03-01 ENCOUNTER — Encounter (HOSPITAL_COMMUNITY): Payer: Self-pay

## 2019-03-09 ENCOUNTER — Ambulatory Visit (HOSPITAL_COMMUNITY): Payer: Self-pay

## 2019-03-09 ENCOUNTER — Other Ambulatory Visit: Payer: Self-pay

## 2019-03-30 ENCOUNTER — Other Ambulatory Visit: Payer: Self-pay

## 2019-03-30 ENCOUNTER — Encounter (HOSPITAL_COMMUNITY): Payer: Self-pay

## 2019-03-30 ENCOUNTER — Ambulatory Visit
Admission: RE | Admit: 2019-03-30 | Discharge: 2019-03-30 | Disposition: A | Discharge status: Home / Self Care | Payer: No Typology Code available for payment source | Source: Ambulatory Visit | Attending: Obstetrics and Gynecology | Admitting: Obstetrics and Gynecology

## 2019-03-30 ENCOUNTER — Ambulatory Visit: Payer: Self-pay

## 2019-03-30 ENCOUNTER — Ambulatory Visit (HOSPITAL_COMMUNITY)
Admission: RE | Admit: 2019-03-30 | Discharge: 2019-03-30 | Disposition: A | Discharge status: Home / Self Care | Payer: Self-pay | Source: Ambulatory Visit | Attending: Obstetrics and Gynecology | Admitting: Obstetrics and Gynecology

## 2019-03-30 DIAGNOSIS — N644 Mastodynia: Secondary | ICD-10-CM | POA: Insufficient documentation

## 2019-03-30 DIAGNOSIS — Z1239 Encounter for other screening for malignant neoplasm of breast: Secondary | ICD-10-CM | POA: Insufficient documentation

## 2019-03-30 NOTE — Progress Notes (Signed)
Complaints of bilateral diffuse breast pain x 2-3 months that comes and goes. Patient rates the pain at a 6-7 out of 10.  Pap Smear: Pap smear not completed today. Last Pap smear was 08/27/2016 at [facility] and normal with negative HPV. Per patient has no history of an abnormal Pap smear. Last Pap smear result is in Epic.  Physical exam: Breasts Breasts symmetrical. No skin abnormalities bilateral breasts. No nipple retraction bilateral breasts. No nipple discharge bilateral breasts. No lymphadenopathy. No lumps palpated bilateral breasts. No complaints of pain or tenderness on exam. Referred patient to the [facility] for a diagnostic mammogram. Appointment scheduled for Tuesday, March 30, 2019 at 1520.        Pelvic/Bimanual No Pap smear completed today since last Pap smear and HPV typing was 08/27/2016. Pap smear not indicated per BCCCP guidelines.   Smoking History: Patient is a current smoker. Discussed smoking cessation with patient. Referred to the [facility] Quitline and gave resources to the free smoking cessation classes at [facility].  Patient Navigation: Patient education provided. Access to services provided for patient through BCCCP program.   Breast and Cervical Cancer Risk Assessment: Patient has no family history of breast cancer, known genetic mutations, or radiation treatment to the chest before age 9. Patient has no history of cervical dysplasia, immunocompromised, or DES exposure in-utero.  Risk Assessment    Risk Scores      03/30/2019   Last edited by: Loletta Parish, RN   5-year risk: 0.7 %   Lifetime risk: 9.5 %

## 2019-03-30 NOTE — Patient Instructions (Signed)
Explained breast self awareness with Robin Fox. Patient did not need a Pap smear today due to last Pap smear and HPV typing was 08/27/2016. Let her know BCCCP will cover Pap smears and HPV typing every 5 years unless has a history of abnormal Pap smears. Referred patient to the [facility] for a diagnostic mammogram. Appointment scheduled for Tuesday, March 30, 2019 at 1520. Patient aware of appointment and will be there. Discussed smoking cessation with patient. Referred to the [facility] Quitline and gave resources to the free smoking cessation classes at [facility]. Robin Fox verbalized understanding.  Robin Fox, Arvil Chaco, RN 12:59 PM

## 2019-04-12 ENCOUNTER — Other Ambulatory Visit: Payer: Self-pay | Admitting: Family Medicine

## 2019-04-12 DIAGNOSIS — I10 Essential (primary) hypertension: Secondary | ICD-10-CM

## 2019-06-02 ENCOUNTER — Encounter (HOSPITAL_COMMUNITY): Payer: Self-pay

## 2019-06-02 ENCOUNTER — Ambulatory Visit (HOSPITAL_COMMUNITY)
Admission: EM | Admit: 2019-06-02 | Discharge: 2019-06-02 | Disposition: A | Discharge status: Home / Self Care | Payer: No Typology Code available for payment source | Attending: Family Medicine | Admitting: Family Medicine

## 2019-06-02 ENCOUNTER — Other Ambulatory Visit: Payer: Self-pay

## 2019-06-02 DIAGNOSIS — R102 Pelvic and perineal pain: Secondary | ICD-10-CM

## 2019-06-02 DIAGNOSIS — N898 Other specified noninflammatory disorders of vagina: Secondary | ICD-10-CM

## 2019-06-02 DIAGNOSIS — M545 Low back pain, unspecified: Secondary | ICD-10-CM

## 2019-06-02 DIAGNOSIS — I10 Essential (primary) hypertension: Secondary | ICD-10-CM

## 2019-06-02 LAB — POCT URINALYSIS DIP (DEVICE)
Bilirubin Urine: NEGATIVE
Glucose, UA: NEGATIVE mg/dL
Hgb urine dipstick: NEGATIVE
Ketones, ur: NEGATIVE mg/dL
Nitrite: NEGATIVE
Protein, ur: 30 mg/dL — AB
Specific Gravity, Urine: >=1.03 (ref 1.005–1.030)
Urobilinogen, UA: 0.2 mg/dL (ref 0.0–1.0)
pH: 5.5 (ref 5.0–8.0)

## 2019-06-02 MED ORDER — METRONIDAZOLE 500 MG PO TABS: 500.0000 mg | ORAL_TABLET | Freq: Two times a day (BID) | ORAL | 0 refills | Status: AC

## 2019-06-02 NOTE — ED Provider Notes (Signed)
[place]    CSN: [id number] Arrival date & time: 06/02/19  0909      History   Chief Complaint Chief Complaint  Patient presents with  . Abdominal Pain    HPI Robin Fox is a 44 y.o. female.   Robin Fox presents with complaints of abdominal cramping for the past three days as well as back pain which started last night. Right low back. Has noted vaginal discharge with odor as well. Sexually active with her husband, denies concerns for STD's. LMP 12/21 which was normal for her. Some pelvic pressure history  Of UTI, and BV in the past, has been a long time. No nausea, vomiting or diarrhea. No fevers. Has had ovarian cysts in the past. Tried motrin which didn't seem to help. No other URI symptoms.     ROS per HPI, negative if not otherwise mentioned.      Past Medical History:  Diagnosis Date  . Anemia    hx   . Gestational diabetes    Neg 2 hr test after preg  . Headache(784.0)   . Herpes genitalia    2013 - 1st outbreak  . Hypertension    2008  . Pre-eclampsia in third trimester   . Seasonal allergies   . Shortness of breath    w exertion  . Trichomonas   . Twins     Patient Active Problem List   Diagnosis Date Noted  . Screening breast examination 03/30/2019  . Breast pain 03/30/2019  . Tobacco use 08/01/2016  . Routine postpartum follow-up 05/22/2012  . Mild or unspecified pre-eclampsia, with delivery 04/07/2012  . Abnormal maternal glucose tolerance, antepartum 03/27/2012  . History of IUGR (intrauterine growth retardation) and stillbirth, currently pregnant 02/12/2012  . History of stillbirth 12/12/2011  . Previous cesarean delivery, antepartum condition or complication 0000000  . Obesity (BMI 30-39.9) 11/21/2011  . Hx of herpes genitalis 11/21/2011  . Hypertension in pregnancy, essential, antepartum 11/09/2011  . Essential hypertension 11/09/2011  . AMA (advanced maternal age) multigravida 35+  11/09/2011    Past Surgical History:  Procedure Laterality Date  . CESAREAN SECTION     X 2  . CESAREAN SECTION  04/21/2012   Procedure: CESAREAN SECTION;  Surgeon: Woodroe Mode, MD;  Location: [facility];  Service: Obstetrics;  Laterality: N/A;  . TOOTH EXTRACTION      OB History    Gravida  4   Para  3   Term  1   Preterm  2   AB  0   Living  4     SAB  0   TAB  0   Ectopic  0   Multiple  2   Live Births  4            Home Medications    Prior to Admission medications   Medication Sig Start Date End Date Taking? Authorizing Provider  albuterol (VENTOLIN HFA) 108 (90 Base) MCG/ACT inhaler Inhale 1-2 puffs into the lungs every 6 (six) hours as needed for wheezing or shortness of breath. 02/25/19   Bast, Tressia Miners A, NP  benzonatate (TESSALON) 100 MG capsule Take 1 capsule (100 mg total) by mouth every 8 (eight) hours. 02/25/19   Loura Halt A, NP  ferrous sulfate 325 (65 FE) MG tablet Take 325 mg by mouth daily with breakfast.    [provider]  hydrochlorothiazide (HYDRODIURIL) 25 MG tablet Take 1 tablet by mouth once daily 04/12/19   Merri Ray  R, MD  metroNIDAZOLE (FLAGYL) 500 MG tablet Take 1 tablet (500 mg total) by mouth 2 (two) times daily for 7 days. 06/02/19 06/09/19  Zigmund Gottron, NP  Multiple Vitamin (MULTIVITAMIN) tablet Take 1 tablet by mouth daily.    [provider]  valACYclovir (VALTREX) 500 MG tablet Take 1 tablet (500 mg total) by mouth daily. Increase to twice per day for 3 days during outbreak. 01/20/19   [name], MD  vitamin C (ASCORBIC ACID) 500 MG tablet Take 500 mg by mouth daily.    [provider]    Family History Family History  Problem Relation Age of Onset  . Hypertension Mother   . Stroke Mother   . Stroke Maternal Grandmother   . Heart disease Maternal Grandfather 60  . Hypertension Brother   . Diabetes Brother   . Stomach cancer Brother   . Hypertension Brother   . Diabetes Brother      Social History Social History   Tobacco Use  . Smoking status: Current Some Day Smoker    Packs/day: 0.25    Years: 17.00    Pack years: 4.25    Types: Cigarettes    Last attempt to quit: 10/11/2011    Years since quitting: 7.6  . Smokeless tobacco: Never Used  Substance Use Topics  . Alcohol use: Yes    Comment: socially  . Drug use: No     Allergies   Penicillins   Review of Systems Review of Systems   Physical Exam Triage Vital Signs ED Triage Vitals  Enc Vitals Group     BP 06/02/19 1013 (!) 145/95     Pulse Rate 06/02/19 1013 76     Resp 06/02/19 1013 18     Temp 06/02/19 1013 98.2 F (36.8 C)     Temp Source 06/02/19 1013 Oral     SpO2 06/02/19 1013 100 %     Weight 06/02/19 1011 215 lb (97.5 kg)     Height --      Head Circumference --      Peak Flow --      Pain Score 06/02/19 1011 9     Pain Loc --      Pain Edu? --      Excl. in [place]? --    No data found.  Updated Vital Signs BP (!) 145/95 (BP Location: Right Arm)   Pulse 76   Temp 98.2 F (36.8 C) (Oral)   Resp 18   Wt 215 lb (97.5 kg)   LMP 05/17/2019   SpO2 100%   BMI 33.18 kg/m   Physical Exam Constitutional:      General: She is not in acute distress.    Appearance: She is well-developed.  Cardiovascular:     Rate and Rhythm: Normal rate.  Pulmonary:     Effort: Pulmonary effort is normal.  Abdominal:     Palpations: Abdomen is soft.     Tenderness: There is abdominal tenderness in the suprapubic area. There is no guarding or rebound.     Comments: Generalized low abdominal and pelvic tenderness to midline as well as bilaterally; no point tenderness   Genitourinary:    Comments: Endorses vaginal discharge with odor; denies  Vaginal bleeding; vaginal self swab collected and pending  Musculoskeletal:     Lumbar back: Tenderness present. No bony tenderness.     Comments: Right low back with muscular tenderness; no spinous process tenderness; full ROM and ambulatory without  difficulty  Skin:    General: Skin is warm and dry.  Neurological:     Mental Status: She is alert and oriented to person, place, and time.      UC Treatments / Results  Labs (all labs ordered are listed, but only abnormal results are displayed) Labs Reviewed  POCT URINALYSIS DIP (DEVICE) - Abnormal; Notable for the following components:      Result Value   Protein, ur 30 (*)    Leukocytes,Ua TRACE (*)    All other components within normal limits  URINE CULTURE  CERVICOVAGINAL ANCILLARY ONLY    EKG   Radiology No results found.  Procedures Procedures (including critical care time)  Medications Ordered in UC Medications - No data to display  Initial Impression / Assessment and Plan / UC Course  I have reviewed the triage vital signs and the nursing notes.  Pertinent labs & imaging results that were available during my care of the patient were reviewed by me and considered in my medical decision making (see chart for details).     Urine with trace leuks, otherwise not obvious for UTI, endorses vaginal discharge, likely source of leuks. Urine culture pending. Odor present, flagyl initiated pending vaginal cytology. Back pain seems more muscular in nature than referred abdominal pain. Pain management discussed. Encouraged follow up with pcp and/or gyne if symptoms persist as may need ultra sound or further evaluation if persistent. Return precautions provided. Patient verbalized understanding and agreeable to plan.   Final Clinical Impressions(s) / UC Diagnoses   Final diagnoses:  Pelvic cramping  Vaginal discharge  Acute right-sided low back pain without sciatica     Discharge Instructions     Your urine does not indicate obvious urinary tract infection. I have sent it to be cultured to confirm this.  We will test your vagina to evaluate your vaginal discharge as well. We will start treatment for bacterial vaginosis in the mean time.  Heat application, ibuprofen  or aleve to help with pain.  Please follow up with your primary care provider and/or your PCP if your symptoms persist as may need further evaluation. If significant worsening please go to the ER.    ED Prescriptions    Medication Sig Dispense Auth. Provider   metroNIDAZOLE (FLAGYL) 500 MG tablet Take 1 tablet (500 mg total) by mouth 2 (two) times daily for 7 days. 14 tablet Zigmund Gottron, NP     PDMP not reviewed this encounter.   Zigmund Gottron, NP 06/02/19 1155

## 2019-06-02 NOTE — ED Triage Notes (Signed)
Pt states she has vaginal cramping and abdominal pain. X 3 days. Pt states her back hurts as well. Pt states she thinks that she may have a UTI.

## 2019-06-02 NOTE — Discharge Instructions (Addendum)
Your urine does not indicate obvious urinary tract infection. I have sent it to be cultured to confirm this.  We will test your vagina to evaluate your vaginal discharge as well. We will start treatment for bacterial vaginosis in the mean time.  Heat application, ibuprofen or aleve to help with pain.  Please follow up with your primary care provider and/or your PCP if your symptoms persist as may need further evaluation. If significant worsening please go to the ER.

## 2019-06-03 LAB — URINE CULTURE

## 2019-06-04 ENCOUNTER — Telehealth: Payer: Self-pay | Admitting: Emergency Medicine

## 2019-06-04 LAB — CERVICOVAGINAL ANCILLARY ONLY
Bacterial vaginitis: POSITIVE — AB
Candida vaginitis: POSITIVE — AB
Chlamydia: NEGATIVE
Neisseria Gonorrhea: NEGATIVE
Trichomonas: POSITIVE — AB

## 2019-06-04 MED ORDER — FLUCONAZOLE 150 MG PO TABS: 150.0000 mg | ORAL_TABLET | Freq: Once | ORAL | 0 refills | Status: AC

## 2019-06-04 NOTE — Telephone Encounter (Signed)
Trichomonas is positive. Rx metronidazole was given at the urgent care visit. Please refrain from sexual intercourse for 7 days to give the medicine time to work. Sexual partners need to be notified and tested/treated. Condoms may reduce risk of reinfection. Recheck for further evaluation if symptoms are not improving.   Bacterial Vaginosis test is positive.  Prescription for metronidazole was given at the urgent care visit.  Test for candida (yeast) was positive.  Prescription for fluconazole 150mg  po now, repeat dose in 3d if needed, #2 no refills, sent to the pharmacy of record.  Recheck or followup with PCP for further evaluation if symptoms are not improving.    Patient contacted by phone and made aware of    results. Pt verbalized understanding and had all questions answered.

## 2019-06-07 ENCOUNTER — Other Ambulatory Visit: Payer: Self-pay | Admitting: Family Medicine

## 2019-06-07 DIAGNOSIS — I10 Essential (primary) hypertension: Secondary | ICD-10-CM

## 2019-06-07 MED ORDER — HYDROCHLOROTHIAZIDE 25 MG PO TABS: 25.0000 mg | ORAL_TABLET | Freq: Every day | ORAL | 0 refills | Status: DC

## 2019-06-29 ENCOUNTER — Telehealth: Payer: Self-pay | Admitting: Family Medicine

## 2019-07-22 ENCOUNTER — Ambulatory Visit: Payer: Self-pay | Admitting: Family Medicine

## 2019-07-25 ENCOUNTER — Other Ambulatory Visit: Payer: Self-pay | Admitting: Family Medicine

## 2019-07-25 DIAGNOSIS — I10 Essential (primary) hypertension: Secondary | ICD-10-CM

## 2019-07-25 NOTE — Telephone Encounter (Signed)
Please make appt for further refills-courtesy refill given Requested Prescriptions  Pending Prescriptions Disp Refills  . hydrochlorothiazide (HYDRODIURIL) 25 MG tablet [Pharmacy Med Name: hydroCHLOROthiazide 25 MG Oral Tablet] 30 tablet 0    Sig: Take 1 tablet by mouth once daily     Cardiovascular: Diuretics - Thiazide Failed - 07/25/2019  3:39 PM      Failed - Na in normal range and within 360 days    Sodium  Date Value Ref Range Status  02/07/2019 133 (L) 135 - 145 mmol/L Final  01/20/2019 139 134 - 144 mmol/L Final         Failed - Last BP in normal range    BP Readings from Last 1 Encounters:  06/02/19 (!) 145/95         Failed - Valid encounter within last 6 months    Recent Outpatient Visits          6 months ago Essential hypertension   [facility], Ranell Patrick, MD   1 year ago Essential hypertension   [facility], Ranell Patrick, MD   1 year ago Essential hypertension   [facility], Ranell Patrick, MD   2 years ago Toe swelling   [facility], Ranell Patrick, MD   3 years ago Essential hypertension   [facility], Ranell Patrick, MD             Passed - Ca in normal range and within 360 days    Calcium  Date Value Ref Range Status  02/07/2019 9.7 8.9 - 10.3 mg/dL Final         Passed - Cr in normal range and within 360 days    Creat  Date Value Ref Range Status  04/06/2012 0.83 0.50 - 1.10 mg/dL Final   Creatinine, Ser  Date Value Ref Range Status  02/07/2019 0.83 0.44 - 1.00 mg/dL Final   Creatinine, Urine  Date Value Ref Range Status  04/27/2012 84.10 mg/dL Final         Passed - K in normal range and within 360 days    Potassium  Date Value Ref Range Status  02/07/2019 3.8 3.5 - 5.1 mmol/L Final

## 2019-07-30 ENCOUNTER — Ambulatory Visit (INDEPENDENT_AMBULATORY_CARE_PROVIDER_SITE_OTHER): Payer: Self-pay

## 2019-07-30 ENCOUNTER — Ambulatory Visit (INDEPENDENT_AMBULATORY_CARE_PROVIDER_SITE_OTHER): Payer: Self-pay | Admitting: Family Medicine

## 2019-07-30 ENCOUNTER — Other Ambulatory Visit: Payer: Self-pay

## 2019-07-30 DIAGNOSIS — M7021 Olecranon bursitis, right elbow: Secondary | ICD-10-CM

## 2019-07-30 DIAGNOSIS — M533 Sacrococcygeal disorders, not elsewhere classified: Secondary | ICD-10-CM

## 2019-07-30 DIAGNOSIS — M545 Low back pain, unspecified: Secondary | ICD-10-CM

## 2019-07-30 DIAGNOSIS — M25521 Pain in right elbow: Secondary | ICD-10-CM

## 2019-07-30 DIAGNOSIS — W19XXXA Unspecified fall, initial encounter: Secondary | ICD-10-CM

## 2019-07-30 DIAGNOSIS — Y92009 Unspecified place in unspecified non-institutional (private) residence as the place of occurrence of the external cause: Secondary | ICD-10-CM

## 2019-07-30 MED ORDER — TRAMADOL HCL 50 MG PO TABS: 50.0000 mg | ORAL_TABLET | Freq: Three times a day (TID) | ORAL | 0 refills | Status: AC | PRN

## 2019-07-30 NOTE — Progress Notes (Signed)
Subjective:  Patient ID: Robin Fox, female    DOB: [birth year]  Age: 44 y.o. MRN: [id number]  CC:  Chief Complaint  Patient presents with  . Fall    Pt fell down 9 stair landed on her buttocks pt has hard time sitting for long peirods of time. pt has been having a hard time sleeping as well because of the pain.pain is sharp and shooting from pt's butt up her back. pt states some numbness and tingling. 9/10 pain level. pt thinks she hit her elbow as well when she fell. Elbow appears swollen maybe even some fluid in the area. pain level 1/10 for elbow currently.    HPI Robin Fox presents for   History of fall: [place] at home about 2 weeks ago -2/19.  Slipped on socks, going down stairs - fell onto buttocks, back slid down 8 stairs and hit R elbow on stairs.  Able to ambulate.  No ER/urgent care eval.  Rested initially.  1st medical eval today.  No prior back/elbow surgery  Pain not improving- still sore in low back/buttock area. More sore to sit on it. Numb feeling in buttock area. No leg radiation.  No bowel or bladder incontinence, no saddle anesthesia, no lower extremity weakness.  Tx: ibuprofen 800mg  2 times per day. Tylenol 500mg  BID.   R elbow swelling - noticed few days ago. Some soreness since injury to bend it, but using normally. Sore with pressure to tip of elbow.   History Patient Active Problem List   Diagnosis Date Noted  . Screening breast examination 03/30/2019  . Breast pain 03/30/2019  . Tobacco use 08/01/2016  . Routine postpartum follow-up 05/22/2012  . Mild or unspecified pre-eclampsia, with delivery 04/07/2012  . Abnormal maternal glucose tolerance, antepartum 03/27/2012  . History of IUGR (intrauterine growth retardation) and stillbirth, currently pregnant 02/12/2012  . History of stillbirth 12/12/2011  . Previous cesarean delivery, antepartum condition or complication 0000000  . Obesity (BMI 30-39.9) 11/21/2011  . Hx  of herpes genitalis 11/21/2011  . Hypertension in pregnancy, essential, antepartum 11/09/2011  . Essential hypertension 11/09/2011  . AMA (advanced maternal age) multigravida 35+ 11/09/2011   Past Medical History:  Diagnosis Date  . Anemia    hx   . Gestational diabetes    Neg 2 hr test after preg  . Headache(784.0)   . Herpes genitalia    2013 - 1st outbreak  . Hypertension    2008  . Pre-eclampsia in third trimester   . Seasonal allergies   . Shortness of breath    w exertion  . Trichomonas   . Twins    Past Surgical History:  Procedure Laterality Date  . CESAREAN SECTION     X 2  . CESAREAN SECTION  04/21/2012   Procedure: CESAREAN SECTION;  Surgeon: Woodroe Mode, MD;  Location: [facility];  Service: Obstetrics;  Laterality: N/A;  . TOOTH EXTRACTION     Allergies  Allergen Reactions  . Penicillins Hives, Shortness Of Breath and Swelling    Has patient had a PCN reaction causing immediate rash, facial/tongue/throat swelling, SOB or lightheadedness with hypotension: no Has patient had a PCN reaction causing severe rash involving mucus membranes or skin necrosis:no Has patient had a PCN reaction that required hospitalization no Has patient had a PCN reaction occurring within the last 10 years: {Yes If all of the above answers are "NO", then may proceed with Cephalosporin use.   Prior to Admission medications   Medication  Sig Start Date End Date Taking? Authorizing Provider  albuterol (VENTOLIN HFA) 108 (90 Base) MCG/ACT inhaler Inhale 1-2 puffs into the lungs every 6 (six) hours as needed for wheezing or shortness of breath. 02/25/19  Yes Bast, Traci A, NP  ferrous sulfate 325 (65 FE) MG tablet Take 325 mg by mouth daily with breakfast.   Yes [provider]  hydrochlorothiazide (HYDRODIURIL) 25 MG tablet Take 1 tablet by mouth once daily 07/25/19  Yes [name], MD  Multiple Vitamin (MULTIVITAMIN) tablet Take 1 tablet by mouth daily.   Yes [provider]  valACYclovir (VALTREX) 500 MG tablet Take 1 tablet (500 mg total) by mouth daily. Increase to twice per day for 3 days during outbreak. 01/20/19  Yes [name], MD  vitamin C (ASCORBIC ACID) 500 MG tablet Take 500 mg by mouth daily.   Yes [provider]  benzonatate (TESSALON) 100 MG capsule Take 1 capsule (100 mg total) by mouth every 8 (eight) hours. Patient not taking: Reported on 07/30/2019 02/25/19   Orvan July, NP   Social History   Socioeconomic History  . Marital status: Married    Spouse name: Not on file  . Number of children: Not on file  . Years of education: Not on file  . Highest education level: Not on file  Occupational History  . Not on file  Tobacco Use  . Smoking status: Current Some Day Smoker    Packs/day: 0.25    Years: 17.00    Pack years: 4.25    Types: Cigarettes    Last attempt to quit: 10/11/2011    Years since quitting: 7.8  . Smokeless tobacco: Never Used  Substance and Sexual Activity  . Alcohol use: Yes    Comment: socially  . Drug use: No  . Sexual activity: Yes    Birth control/protection: Other-see comments, None    Comment: Partner had vasectomy-1st intercourse 63 yo-More than 5 partners  Other Topics Concern  . Not on file  Social History Narrative  . Not on file   Social Determinants of Health   Financial Resource Strain:   . Difficulty of Paying Living Expenses: Not on file  Food Insecurity:   . Worried About Charity fundraiser in the Last Year: Not on file  . Ran Out of Food in the Last Year: Not on file  Transportation Needs: No Transportation Needs  . Lack of Transportation (Medical): No  . Lack of Transportation (Non-Medical): No  Physical Activity:   . Days of Exercise per Week: Not on file  . Minutes of Exercise per Session: Not on file  Stress:   . Feeling of Stress : Not on file  Social Connections:   . Frequency of Communication with Friends and Family: Not on file  . Frequency of  Social Gatherings with Friends and Family: Not on file  . Attends Religious Services: Not on file  . Active Member of Clubs or Organizations: Not on file  . Attends Archivist Meetings: Not on file  . Marital Status: Not on file  Intimate Partner Violence:   . Fear of Current or Ex-Partner: Not on file  . Emotionally Abused: Not on file  . Physically Abused: Not on file  . Sexually Abused: Not on file    Review of Systems  Per HPI.   Objective:  There were no vitals filed for this visit.   Physical Exam Constitutional:      General: She  is not in acute distress.    Appearance: She is well-developed.  HENT:     Head: Normocephalic and atraumatic.  Cardiovascular:     Rate and Rhythm: Normal rate.  Pulmonary:     Effort: Pulmonary effort is normal.  Musculoskeletal:     Right shoulder: Normal.     Right upper arm: Normal.     Right elbow: Swelling (olecranon bursa. ) present. No deformity. Normal range of motion (slight pain with supination at radial head. ROM full. ). Tenderness present in radial head and olecranon process.     Right forearm: Normal.     Left forearm: Normal.     Right wrist: Normal.     Left wrist: Normal.     Lumbar back: Tenderness and bony tenderness (mid lo lower mid lumbar. ) present. Decreased range of motion: guarded flex tt 70-80 degrees.      Right hip: No tenderness or bony tenderness.     Left hip: Normal. No tenderness or bony tenderness (pain free IR/ER of left hip. sore in back/buttocks on left with lumbar ROM).     Comments: ttp lower sacrum with reported decreased sensation mid aspect buttocks.   1+ reflex at patella, achilles bilat  Skin:      Neurological:     Mental Status: She is alert and oriented to person, place, and time.   DG Lumbar Spine Complete  Result Date: 07/30/2019 CLINICAL DATA:  44 year old female with low back pain. EXAM: LUMBAR SPINE - COMPLETE 4+ VIEW COMPARISON:  None. FINDINGS: Five lumbar type  vertebra. There is no acute fracture or subluxation of the lumbar spine. The vertebral body heights and disc spaces are maintained. The visualized posterior elements are intact. The soft tissues are unremarkable. IMPRESSION: Negative. Electronically Signed   By: Anner Crete M.D.   On: 07/30/2019 17:17   DG Pelvis 1-2 Views  Result Date: 07/30/2019 CLINICAL DATA:  44 year old female with low back and sacral pain. EXAM: PELVIS - 1-2 VIEW; SACRUM AND COCCYX - 2+ VIEW COMPARISON:  None. FINDINGS: There is no evidence of pelvic fracture or diastasis. No pelvic bone lesions are seen. IMPRESSION: Negative. Electronically Signed   By: Anner Crete M.D.   On: 07/30/2019 17:16   DG Sacrum/Coccyx  Result Date: 07/30/2019 CLINICAL DATA:  44 year old female with low back and sacral pain. EXAM: PELVIS - 1-2 VIEW; SACRUM AND COCCYX - 2+ VIEW COMPARISON:  None. FINDINGS: There is no evidence of pelvic fracture or diastasis. No pelvic bone lesions are seen. IMPRESSION: Negative. Electronically Signed   By: Anner Crete M.D.   On: 07/30/2019 17:16   DG ELBOW COMPLETE RIGHT (3+VIEW)  Result Date: 07/30/2019 CLINICAL DATA:  44 year old female with right elbow pain. EXAM: RIGHT ELBOW - COMPLETE 3+ VIEW COMPARISON:  None. FINDINGS: There is no evidence of fracture, dislocation, or joint effusion. There is no evidence of arthropathy or other focal bone abnormality. Soft tissues are unremarkable. IMPRESSION: Negative. Electronically Signed   By: Anner Crete M.D.   On: 07/30/2019 17:18    Assessment & Plan:  Robin Fox is a 44 y.o. female . Acute midline low back pain without sciatica - Plan: DG Lumbar Spine Complete  Right elbow pain - Plan: DG ELBOW COMPLETE RIGHT (3+VIEW), Apply ace wrap  Olecranon bursitis, right elbow - Plan: DG ELBOW COMPLETE RIGHT (3+VIEW), Apply ace wrap  Fall in home, initial encounter  Sacral pain - Plan: DG Pelvis 1-2 Views, DG Sacrum/Coccyx, traMADol  (ULTRAM) 50  MG tablet  Fall at home downstairs as above.  X-rays without signs of fracture.  Based on location of pain at sacrum/coccyx, possible coccygeal fracture versus contusion.  No apparent displacement on x-ray.  Dysesthesias to the buttock area but reflexes equal in strength intact lower extremities without assistance.  Option of advanced imaging with CT/MRI discussed but in discussion with patient, would like to initially try symptomatic care with tramadol, potential effects discussed, ER/follow-up precautions were discussed.   Likely contusion of right elbow with secondary olecranon bursitis, only minimal swelling, no signs of infection.  Deferred aspiration at this time but RTC precautions and options of aspiration were given.  Ace bandage applied.  Meds ordered this encounter  Medications  . traMADol (ULTRAM) 50 MG tablet    Sig: Take 1 tablet (50 mg total) by mouth every 8 (eight) hours as needed for up to 5 days.    Dispense:  15 tablet    Refill:  0   Patient Instructions    You may have bruised or injured your coccyx (tailbone) and strained low back.  Sitting on an inflatable pillow or ring to take pressure off of that area can be helpful.  Range of motion as tolerated for your back.  Tramadol if needed for more severe pain but try Tylenol first.  Ibuprofen short term if needed.  If not improving in the next 1 to 2 weeks at the most or any worsening sooner, would recommend follow-up with orthopedics or possible other imaging with CT scan.  elbow x-ray did not indicate any fracture.  I suspect you have a contusion or bruising of the elbow as well with some swelling at the tip of the elbow.  That should also improve with some time, but you can apply an Ace bandage over that area if it feels better.  Return to the clinic or go to the nearest emergency room if any of your symptoms worsen or new symptoms occur.   Elbow Contusion An elbow contusion is a deep bruise of the elbow.  Contusions are the result of a blunt injury to tissues and muscle fibers under the skin. The injury causes bleeding under the skin. The skin overlying the contusion may turn blue, purple, or yellow. Minor injuries will give you a painless contusion, but more severe contusions may stay painful and swollen for a few weeks. What are the causes? Common causes of this condition include:  A hard hit to the elbow.  An injury (trauma) to the elbow.  Direct force on the elbow, such as from a fall. What increases the risk? You are more likely to develop this condition if you:  Play sports or do other physical activities.  Use blood thinners. What are the signs or symptoms? Symptoms of this condition include:  Swelling of the elbow.  Pain and tenderness of the elbow.  Discoloration of the elbow. The area may have redness and then turn blue, purple, or yellow. How is this diagnosed? This condition is diagnosed based on:  Your symptoms and medical history.  A physical exam. You may also need an X-ray to determine if there are any associated injuries, such as broken bones (fractures). An MRI might be done if the swelling and pain do not go away in a few weeks. How is this treated? This condition may be treated with:  Rest, ice, pressure (compression), and elevation. This is often called RICE therapy. In general, this is considered the best treatment for this condition.  A  sling or splint to support your injury.  Over-the-counter anti-inflammatory medicines, such as ibuprofen, for pain control.  Range-of-motion exercises. Follow these instructions at home: RICE therapy  Rest the injured area.  If directed, put ice on the injured area: ? If you have a removable sling or splint, remove it as told by your health care provider. ? Put ice in a plastic bag. ? Place a towel between your skin and the bag. ? Leave the ice on for 20 minutes, 2-3 times a day.  If directed, apply light  compression to the injured area using an elastic bandage. Make sure the bandage is not wrapped too tightly. Remove and reapply the bandage as directed by your health care provider.  Raise (elevate) the injured area above the level of your heart while you are sitting or lying down. If you have a sling or splint:   Wear the sling or splint as told by your health care provider. Remove it only as told by your health care provider.  Loosen the sling or splint if your fingers tingle, become numb, or turn cold and blue.  Keep the sling or splint clean.  If the sling or splint is not waterproof: ? Do not let it get wet. ? Cover it with a watertight covering when you take a bath or a shower. General instructions  Take over-the-counter and prescription medicines only as told by your health care provider.  Return to your normal activities as told by your health care provider. Ask your health care provider what activities are safe for you.  Do range-of-motion exercises only as told by your health care provider.  Ask your health care provider when it is safe to drive if you have a sling or splint on your arm.  Wear elbow pads as told by your health care provider.  Keep all follow-up visits as told by your health care provider. This is important. Contact a health care provider if:  Your symptoms do not improve after several days of treatment.  You have more redness, swelling, or pain in your elbow.  You have difficulty moving the injured area.  Your swelling or pain is not relieved with medicines. Get help right away if:  Your skin over the contusion breaks and starts bleeding.  You have severe pain.  You have numbness in your hand or fingers.  Your hand or fingers turn pale or cold.  You have swelling of your hand and fingers.  You cannot move your fingers or wrist. Summary  An elbow contusion is a deep bruise of the elbow.  Symptoms include pain, swelling, and discoloration  of the elbow.  Rest the injured area and apply ice to the area as told by your health care provider.  If directed, apply light compression to the injured area using an elastic bandage.  Raise (elevate) the injured area above the level of your heart while you are sitting or lying down. This information is not intended to replace advice given to you by your health care provider. Make sure you discuss any questions you have with your health care provider. Document Revised: 11/13/2017 Document Reviewed: 11/13/2017 Elsevier Patient Education  [place] Injury  The tailbone (coccyx) is the small bone at the lower end of the spine. A tailbone injury may involve stretched ligaments, bruising, or a broken bone (fracture). Tailbone injuries can be painful, and some may take a long time to heal. What are the causes? This condition may be caused  by:  Falling and landing on the tailbone.  Repeated strain or friction from sitting for long periods of time. This may include actions such as rowing and bicycling.  Childbirth. In some cases, the cause may not be known. What are the signs or symptoms? Symptoms of this condition include:  Pain in the tailbone area or lower back, especially when sitting.  Pain or difficulty when standing up from a sitting position.  Bruising or swelling in the tailbone area.  Painful bowel movements.  In women, pain during intercourse. How is this diagnosed? This condition may be diagnosed based on:  Your symptoms.  A physical exam. If your health care provider suspects a fracture, you may have additional tests, such as:  X-rays.  CT scan.  MRI. How is this treated? Most tailbone injuries heal on their own in 4-6 weeks. However, recovery time may be longer if the injury involves a fracture. Treatment for this condition may include:  NSAIDs or other over-the-counter medicines to help relieve your pain.  Using a large, rubber or  inflated ring or cushion to take pressure off the tailbone when sitting.  Physical therapy.  Injecting the tailbone area with local anesthesia and steroid medicine. This is not normally needed unless the pain does not improve over time with over-the-counter pain medicines. Follow these instructions at home: Activity  Avoid sitting for long periods of time.  To prevent repeating an injury that is caused by strain or friction: ? Wear appropriate padding and sports gear when bicycling and rowing.  Increase your activity as the pain allows. Perform any exercises that are recommended by your health care provider or physical therapist. Managing pain, stiffness, and swelling  To help decrease discomfort when sitting: ? Sit on your rubber or inflated ring or cushion as told by your health care provider. ? Lean forward when you sit.  If directed, apply ice to the injured area: ? Put ice in a plastic bag. ? Place a towel between your skin and the bag. ? Leave the ice on for 20 minutes, 2-3 times per day for the first 1-2 days.  If directed, apply heat to the affected area as often as told by your health care provider. Use the heat source that your health care provider recommends, such as a moist heat pack or a heating pad. ? Place a towel between your skin and the heat source. ? Leave the heat on for 20-30 minutes. ? Remove the heat if your skin turns bright red. This is especially important if you are unable to feel pain, heat, or cold. You may have a greater risk of getting burned. General instructions  Take over-the-counter and prescription medicines only as told by your health care provider.  To prevent or treat constipation or painful bowel movements, your health care provider may recommend that you: ? Drink enough fluid to keep your urine pale yellow. ? Eat foods that are high in fiber, such as fresh fruits and vegetables, whole grains, and beans. ? Limit foods that are high in fat  and processed sugars, such as fried and sweet foods. ? Take an over-the-counter or prescription medicine for constipation.  Keep all follow-up visits as directed by your health care provider. This is important. Contact a health care provider if:  Your pain becomes worse or is not controlled with medicine.  Your bowel movements cause a great deal of discomfort.  You are unable to have a bowel movement after 4 days.  You have pain  during intercourse. Summary  A tailbone injury may involve stretched ligaments, bruising, or a broken bone (fracture).  Tailbone injuries can be painful. Most heal on their own in 4-6 weeks.  Treatment may include taking NSAIDs, using a rubber or inflated ring or cushion when sitting, and physical therapy.  Follow any recommendations from your health care provider to prevent or treat constipation. This information is not intended to replace advice given to you by your health care provider. Make sure you discuss any questions you have with your health care provider. Document Revised: 06/10/2017 Document Reviewed: 06/10/2017 Elsevier Patient Education  [employer].    If you have lab work done today you will be contacted with your lab results within the next 2 weeks.  If you have not heard from [place] then please contact us. The fastest way to get your results is to register for My Chart.   IF you received an x-ray today, you will receive an invoice from [facility]. Please contact [facility] at [phone] with questions or concerns regarding your invoice.   IF you received labwork today, you will receive an invoice from [facility]. Please contact [facility] at [phone] with questions or concerns regarding your invoice.   Our billing staff will not be able to assist you with questions regarding bills from these companies.  You will be contacted with the lab results as soon as they are available. The fastest way to get your results is  to activate your My Chart account. Instructions are located on the last page of this paperwork. If you have not heard from [place] regarding the results in 2 weeks, please contact this office.         Signed, Merri Ray, MD [facility]

## 2019-07-30 NOTE — Patient Instructions (Addendum)
You may have bruised or injured your coccyx (tailbone) and strained low back.  Sitting on an inflatable pillow or ring to take pressure off of that area can be helpful.  Range of motion as tolerated for your back.  Tramadol if needed for more severe pain but try Tylenol first.  Ibuprofen short term if needed.  If not improving in the next 1 to 2 weeks at the most or any worsening sooner, would recommend follow-up with orthopedics or possible other imaging with CT scan.  elbow x-ray did not indicate any fracture.  I suspect you have a contusion or bruising of the elbow as well with some swelling at the tip of the elbow.  That should also improve with some time, but you can apply an Ace bandage over that area if it feels better.  Return to the clinic or go to the nearest emergency room if any of your symptoms worsen or new symptoms occur.   Elbow Contusion An elbow contusion is a deep bruise of the elbow. Contusions are the result of a blunt injury to tissues and muscle fibers under the skin. The injury causes bleeding under the skin. The skin overlying the contusion may turn blue, purple, or yellow. Minor injuries will give you a painless contusion, but more severe contusions may stay painful and swollen for a few weeks. What are the causes? Common causes of this condition include:  A hard hit to the elbow.  An injury (trauma) to the elbow.  Direct force on the elbow, such as from a fall. What increases the risk? You are more likely to develop this condition if you:  Play sports or do other physical activities.  Use blood thinners. What are the signs or symptoms? Symptoms of this condition include:  Swelling of the elbow.  Pain and tenderness of the elbow.  Discoloration of the elbow. The area may have redness and then turn blue, purple, or yellow. How is this diagnosed? This condition is diagnosed based on:  Your symptoms and medical history.  A physical exam. You may also need  an X-ray to determine if there are any associated injuries, such as broken bones (fractures). An MRI might be done if the swelling and pain do not go away in a few weeks. How is this treated? This condition may be treated with:  Rest, ice, pressure (compression), and elevation. This is often called RICE therapy. In general, this is considered the best treatment for this condition.  A sling or splint to support your injury.  Over-the-counter anti-inflammatory medicines, such as ibuprofen, for pain control.  Range-of-motion exercises. Follow these instructions at home: RICE therapy  Rest the injured area.  If directed, put ice on the injured area: ? If you have a removable sling or splint, remove it as told by your health care provider. ? Put ice in a plastic bag. ? Place a towel between your skin and the bag. ? Leave the ice on for 20 minutes, 2-3 times a day.  If directed, apply light compression to the injured area using an elastic bandage. Make sure the bandage is not wrapped too tightly. Remove and reapply the bandage as directed by your health care provider.  Raise (elevate) the injured area above the level of your heart while you are sitting or lying down. If you have a sling or splint:   Wear the sling or splint as told by your health care provider. Remove it only as told by your health care provider.  Loosen the sling or splint if your fingers tingle, become numb, or turn cold and blue.  Keep the sling or splint clean.  If the sling or splint is not waterproof: ? Do not let it get wet. ? Cover it with a watertight covering when you take a bath or a shower. General instructions  Take over-the-counter and prescription medicines only as told by your health care provider.  Return to your normal activities as told by your health care provider. Ask your health care provider what activities are safe for you.  Do range-of-motion exercises only as told by your health care  provider.  Ask your health care provider when it is safe to drive if you have a sling or splint on your arm.  Wear elbow pads as told by your health care provider.  Keep all follow-up visits as told by your health care provider. This is important. Contact a health care provider if:  Your symptoms do not improve after several days of treatment.  You have more redness, swelling, or pain in your elbow.  You have difficulty moving the injured area.  Your swelling or pain is not relieved with medicines. Get help right away if:  Your skin over the contusion breaks and starts bleeding.  You have severe pain.  You have numbness in your hand or fingers.  Your hand or fingers turn pale or cold.  You have swelling of your hand and fingers.  You cannot move your fingers or wrist. Summary  An elbow contusion is a deep bruise of the elbow.  Symptoms include pain, swelling, and discoloration of the elbow.  Rest the injured area and apply ice to the area as told by your health care provider.  If directed, apply light compression to the injured area using an elastic bandage.  Raise (elevate) the injured area above the level of your heart while you are sitting or lying down. This information is not intended to replace advice given to you by your health care provider. Make sure you discuss any questions you have with your health care provider. Document Revised: 11/13/2017 Document Reviewed: 11/13/2017 Elsevier Patient Education  [place] Injury  The tailbone (coccyx) is the small bone at the lower end of the spine. A tailbone injury may involve stretched ligaments, bruising, or a broken bone (fracture). Tailbone injuries can be painful, and some may take a long time to heal. What are the causes? This condition may be caused by:  Falling and landing on the tailbone.  Repeated strain or friction from sitting for long periods of time. This may include actions such  as rowing and bicycling.  Childbirth. In some cases, the cause may not be known. What are the signs or symptoms? Symptoms of this condition include:  Pain in the tailbone area or lower back, especially when sitting.  Pain or difficulty when standing up from a sitting position.  Bruising or swelling in the tailbone area.  Painful bowel movements.  In women, pain during intercourse. How is this diagnosed? This condition may be diagnosed based on:  Your symptoms.  A physical exam. If your health care provider suspects a fracture, you may have additional tests, such as:  X-rays.  CT scan.  MRI. How is this treated? Most tailbone injuries heal on their own in 4-6 weeks. However, recovery time may be longer if the injury involves a fracture. Treatment for this condition may include:  NSAIDs or other over-the-counter medicines to help relieve your  pain.  Using a large, rubber or inflated ring or cushion to take pressure off the tailbone when sitting.  Physical therapy.  Injecting the tailbone area with local anesthesia and steroid medicine. This is not normally needed unless the pain does not improve over time with over-the-counter pain medicines. Follow these instructions at home: Activity  Avoid sitting for long periods of time.  To prevent repeating an injury that is caused by strain or friction: ? Wear appropriate padding and sports gear when bicycling and rowing.  Increase your activity as the pain allows. Perform any exercises that are recommended by your health care provider or physical therapist. Managing pain, stiffness, and swelling  To help decrease discomfort when sitting: ? Sit on your rubber or inflated ring or cushion as told by your health care provider. ? Lean forward when you sit.  If directed, apply ice to the injured area: ? Put ice in a plastic bag. ? Place a towel between your skin and the bag. ? Leave the ice on for 20 minutes, 2-3 times per  day for the first 1-2 days.  If directed, apply heat to the affected area as often as told by your health care provider. Use the heat source that your health care provider recommends, such as a moist heat pack or a heating pad. ? Place a towel between your skin and the heat source. ? Leave the heat on for 20-30 minutes. ? Remove the heat if your skin turns bright red. This is especially important if you are unable to feel pain, heat, or cold. You may have a greater risk of getting burned. General instructions  Take over-the-counter and prescription medicines only as told by your health care provider.  To prevent or treat constipation or painful bowel movements, your health care provider may recommend that you: ? Drink enough fluid to keep your urine pale yellow. ? Eat foods that are high in fiber, such as fresh fruits and vegetables, whole grains, and beans. ? Limit foods that are high in fat and processed sugars, such as fried and sweet foods. ? Take an over-the-counter or prescription medicine for constipation.  Keep all follow-up visits as directed by your health care provider. This is important. Contact a health care provider if:  Your pain becomes worse or is not controlled with medicine.  Your bowel movements cause a great deal of discomfort.  You are unable to have a bowel movement after 4 days.  You have pain during intercourse. Summary  A tailbone injury may involve stretched ligaments, bruising, or a broken bone (fracture).  Tailbone injuries can be painful. Most heal on their own in 4-6 weeks.  Treatment may include taking NSAIDs, using a rubber or inflated ring or cushion when sitting, and physical therapy.  Follow any recommendations from your health care provider to prevent or treat constipation. This information is not intended to replace advice given to you by your health care provider. Make sure you discuss any questions you have with your health care  provider. Document Revised: 06/10/2017 Document Reviewed: 06/10/2017 Elsevier Patient Education  [employer].    If you have lab work done today you will be contacted with your lab results within the next 2 weeks.  If you have not heard from [place] then please contact us. The fastest way to get your results is to register for My Chart.   IF you received an x-ray today, you will receive an invoice from [facility]. Please contact [facility]  at [phone] with questions or concerns regarding your invoice.   IF you received labwork today, you will receive an invoice from [facility]. Please contact [facility] at [phone] with questions or concerns regarding your invoice.   Our billing staff will not be able to assist you with questions regarding bills from these companies.  You will be contacted with the lab results as soon as they are available. The fastest way to get your results is to activate your My Chart account. Instructions are located on the last page of this paperwork. If you have not heard from [place] regarding the results in 2 weeks, please contact this office.

## 2019-08-01 ENCOUNTER — Encounter: Payer: Self-pay | Admitting: Family Medicine

## 2019-09-19 ENCOUNTER — Other Ambulatory Visit: Payer: Self-pay | Admitting: Family Medicine

## 2019-09-19 DIAGNOSIS — I10 Essential (primary) hypertension: Secondary | ICD-10-CM

## 2019-09-19 NOTE — Telephone Encounter (Signed)
Refill refused. Pt needs appt- [facility] no longer makes appt for this practice.

## 2019-09-27 ENCOUNTER — Ambulatory Visit: Payer: Self-pay | Admitting: Family Medicine

## 2019-09-27 ENCOUNTER — Encounter: Payer: Self-pay | Admitting: Family Medicine

## 2019-09-27 ENCOUNTER — Telehealth (INDEPENDENT_AMBULATORY_CARE_PROVIDER_SITE_OTHER): Payer: Self-pay | Admitting: Family Medicine

## 2019-09-27 ENCOUNTER — Other Ambulatory Visit: Payer: Self-pay

## 2019-09-27 VITALS — Ht 67.5 in | Wt 215.0 lb

## 2019-09-27 DIAGNOSIS — I10 Essential (primary) hypertension: Secondary | ICD-10-CM

## 2019-09-27 DIAGNOSIS — E781 Pure hyperglyceridemia: Secondary | ICD-10-CM

## 2019-09-27 DIAGNOSIS — R739 Hyperglycemia, unspecified: Secondary | ICD-10-CM

## 2019-09-27 MED ORDER — HYDROCHLOROTHIAZIDE 25 MG PO TABS: 25.0000 mg | ORAL_TABLET | Freq: Every day | ORAL | 1 refills | Status: DC

## 2019-09-27 NOTE — Patient Instructions (Addendum)
   Good talking to you today.  I have sent in refills of medications but make sure you keep that appointment for a nurse visit in the next 2 weeks.  Can bring a copy of blood pressures for me to review at that time as well.  We will check blood pressure as well as lab work at that time. Then in  office exam with me in 6 months if everything is controlled.  Let me know if there are questions and take care.  If you have lab work done today you will be contacted with your lab results within the next 2 weeks.  If you have not heard from [place] then please contact us. The fastest way to get your results is to register for My Chart.   IF you received an x-ray today, you will receive an invoice from [facility]. Please contact [facility] at [phone] with questions or concerns regarding your invoice.   IF you received labwork today, you will receive an invoice from [facility]. Please contact [facility] at [phone] with questions or concerns regarding your invoice.   Our billing staff will not be able to assist you with questions regarding bills from these companies.  You will be contacted with the lab results as soon as they are available. The fastest way to get your results is to activate your My Chart account. Instructions are located on the last page of this paperwork. If you have not heard from [place] regarding the results in 2 weeks, please contact this office.

## 2019-09-27 NOTE — Progress Notes (Signed)
Virtual Visit via Video Note  I connected with Robin Fox on 09/27/19 at 2:29 PM by a video enabled telemedicine application and verified that I am speaking with the correct person using two identifiers. Initial video, then transitioned to audio d/t signal issues sounds.    I discussed the limitations, risks, security and privacy concerns of performing an evaluation and management service by telephone and the availability of in person appointments. I also discussed with the patient that there may be a patient responsible charge related to this service. The patient expressed understanding and agreed to proceed, consent obtained  Chief complaint:  Chief Complaint  Patient presents with  . Medication Refill    pt needs a refill on her hydroclorthyzide. pt would like multiple refills so she won't have to make an appt every month for refills. pt states this medication works well with no side effects.     History of Present Illness: Robin Fox is a 44 y.o. female  Hypertension: Last discussed in August 2020 (She was seen in March for acute injury).  Hydrochlorothiazide 25 mg daily. Sodium borderline low at 133 in September 2020. Home readings: no home readings.  Out of meds past 2 weeks - needed office visit. No side effects Constitutional: Negative for fatigue and unexpected weight change.  Eyes: Negative for visual disturbance.  Respiratory: Negative for cough, chest tightness and shortness of breath.   Cardiovascular: Negative for chest pain, palpitations and leg swelling.  Gastrointestinal: Negative for abdominal pain and blood in stool.  Neurological: Negative for dizziness, light-headedness and headaches.    BP Readings from Last 3 Encounters:  06/02/19 (!) 145/95  03/30/19 (!) 145/99  02/25/19 132/80   Lab Results  Component Value Date   CREATININE 0.83 02/07/2019    Hyperglycemia: Borderline - glucose 102 in 01/2019. 115 in 01/20/19. Hx of  gestational DM, prediabetes postpartum, then normalized A1c. On meds in past - not recent.  Hypertriglyceridemia: Lab Results  Component Value Date   CHOL 182 01/20/2019   HDL 46 01/20/2019   LDLCALC 102 (H) 01/20/2019   TRIG 170 (H) 01/20/2019   CHOLHDL 4.0 01/20/2019   Plan for lab visit next 2 weeks.    Patient Active Problem List   Diagnosis Date Noted  . Screening breast examination 03/30/2019  . Breast pain 03/30/2019  . Tobacco use 08/01/2016  . Routine postpartum follow-up 05/22/2012  . Mild or unspecified pre-eclampsia, with delivery 04/07/2012  . Abnormal maternal glucose tolerance, antepartum 03/27/2012  . History of IUGR (intrauterine growth retardation) and stillbirth, currently pregnant 02/12/2012  . History of stillbirth 12/12/2011  . Previous cesarean delivery, antepartum condition or complication 0000000  . Obesity (BMI 30-39.9) 11/21/2011  . Hx of herpes genitalis 11/21/2011  . Hypertension in pregnancy, essential, antepartum 11/09/2011  . Essential hypertension 11/09/2011  . AMA (advanced maternal age) multigravida 35+ 11/09/2011   Past Medical History:  Diagnosis Date  . Anemia    hx   . Gestational diabetes    Neg 2 hr test after preg  . Headache(784.0)   . Herpes genitalia    2013 - 1st outbreak  . Hypertension    2008  . Pre-eclampsia in third trimester   . Seasonal allergies   . Shortness of breath    w exertion  . Trichomonas   . Twins    Past Surgical History:  Procedure Laterality Date  . CESAREAN SECTION     X 2  . CESAREAN SECTION  04/21/2012  Procedure: CESAREAN SECTION;  Surgeon: Woodroe Mode, MD;  Location: [facility];  Service: Obstetrics;  Laterality: N/A;  . TOOTH EXTRACTION     Allergies  Allergen Reactions  . Penicillins Hives, Shortness Of Breath and Swelling    Has patient had a PCN reaction causing immediate rash, facial/tongue/throat swelling, SOB or lightheadedness with hypotension: no Has patient had a PCN  reaction causing severe rash involving mucus membranes or skin necrosis:no Has patient had a PCN reaction that required hospitalization no Has patient had a PCN reaction occurring within the last 10 years: {Yes If all of the above answers are "NO", then may proceed with Cephalosporin use.   Prior to Admission medications   Medication Sig Start Date End Date Taking? Authorizing Provider  albuterol (VENTOLIN HFA) 108 (90 Base) MCG/ACT inhaler Inhale 1-2 puffs into the lungs every 6 (six) hours as needed for wheezing or shortness of breath. 02/25/19   Bast, Tressia Miners A, NP  benzonatate (TESSALON) 100 MG capsule Take 1 capsule (100 mg total) by mouth every 8 (eight) hours. Patient not taking: Reported on 07/30/2019 02/25/19   Loura Halt A, NP  ferrous sulfate 325 (65 FE) MG tablet Take 325 mg by mouth daily with breakfast.    [provider]  hydrochlorothiazide (HYDRODIURIL) 25 MG tablet Take 1 tablet by mouth once daily 07/25/19   [name], MD  Multiple Vitamin (MULTIVITAMIN) tablet Take 1 tablet by mouth daily.    [provider]  valACYclovir (VALTREX) 500 MG tablet Take 1 tablet (500 mg total) by mouth daily. Increase to twice per day for 3 days during outbreak. 01/20/19   [name], MD  vitamin C (ASCORBIC ACID) 500 MG tablet Take 500 mg by mouth daily.    [provider]   Social History   Socioeconomic History  . Marital status: Married    Spouse name: Not on file  . Number of children: Not on file  . Years of education: Not on file  . Highest education level: Not on file  Occupational History  . Not on file  Tobacco Use  . Smoking status: Former Smoker    Packs/day: 0.25    Years: 17.00    Pack years: 4.25    Types: Cigarettes    Quit date: 09/11/2019    Years since quitting: 0.0  . Smokeless tobacco: Never Used  Substance and Sexual Activity  . Alcohol use: Yes    Comment: socially  . Drug use: No  . Sexual activity: Yes    Birth  control/protection: Other-see comments, None    Comment: Partner had vasectomy-1st intercourse 40 yo-More than 5 partners  Other Topics Concern  . Not on file  Social History Narrative  . Not on file   Social Determinants of Health   Financial Resource Strain:   . Difficulty of Paying Living Expenses:   Food Insecurity:   . Worried About Charity fundraiser in the Last Year:   . Arboriculturist in the Last Year:   Transportation Needs: No Transportation Needs  . Lack of Transportation (Medical): No  . Lack of Transportation (Non-Medical): No  Physical Activity:   . Days of Exercise per Week:   . Minutes of Exercise per Session:   Stress:   . Feeling of Stress :   Social Connections:   . Frequency of Communication with Friends and Family:   . Frequency of Social Gatherings with Friends and Family:   . Attends Religious  Services:   . Active Member of Clubs or Organizations:   . Attends Archivist Meetings:   Marland Kitchen Marital Status:   Intimate Partner Violence:   . Fear of Current or Ex-Partner:   . Emotionally Abused:   Marland Kitchen Physically Abused:   . Sexually Abused:     Observations/Objective: Vitals:   09/27/19 1241  Weight: 215 lb (97.5 kg)  Height: 5' 7.5" (1.715 m)  No distress on video/phone, appropriate responses, no distress.  All questions answered   Assessment and Plan: Hypertriglyceridemia - Plan: Lipid panel  -Repeat testing with lab only visit  Essential hypertension - Plan: hydrochlorothiazide (HYDRODIURIL) 25 MG tablet, Comprehensive metabolic panel  -Restart medication, has been nurse visit in 2 weeks to check labs as well as blood pressure reading at that time to decide if changes needed.  In office exam in the next 6 months.  Hyperglycemia - Plan: Comprehensive metabolic panel, Hemoglobin A1c  -Previous gestational diabetes, will check A1c with upcoming labs  Follow Up Instructions:    I discussed the assessment and treatment plan with the  patient. The patient was provided an opportunity to ask questions and all were answered. The patient agreed with the plan and demonstrated an understanding of the instructions.   The patient was advised to call back or seek an in-person evaluation if the symptoms worsen or if the condition fails to improve as anticipated.  I provided 11 minutes of non-face-to-face time during this encounter.   [name], MD

## 2019-09-28 ENCOUNTER — Encounter: Payer: Self-pay | Admitting: Family Medicine

## 2019-10-11 ENCOUNTER — Other Ambulatory Visit: Payer: Self-pay

## 2019-10-11 ENCOUNTER — Ambulatory Visit (INDEPENDENT_AMBULATORY_CARE_PROVIDER_SITE_OTHER): Payer: 59 | Admitting: Family Medicine

## 2019-10-11 DIAGNOSIS — I10 Essential (primary) hypertension: Secondary | ICD-10-CM

## 2019-10-11 DIAGNOSIS — R739 Hyperglycemia, unspecified: Secondary | ICD-10-CM

## 2019-10-11 DIAGNOSIS — E781 Pure hyperglyceridemia: Secondary | ICD-10-CM

## 2019-10-12 LAB — LIPID PANEL
Chol/HDL Ratio: 3.5 ratio (ref 0.0–4.4)
Cholesterol, Total: 180 mg/dL (ref 100–199)
HDL: 52 mg/dL (ref >39)
LDL Chol Calc (NIH): 112 mg/dL — ABNORMAL HIGH (ref 0–99)
Triglycerides: 88 mg/dL (ref 0–149)
VLDL Cholesterol Cal: 16 mg/dL (ref 5–40)

## 2019-10-12 LAB — COMPREHENSIVE METABOLIC PANEL
ALT: 10 IU/L (ref 0–32)
AST: 23 IU/L (ref 0–40)
Albumin/Globulin Ratio: 1.6 (ref 1.2–2.2)
Albumin: 4.3 g/dL (ref 3.8–4.8)
Alkaline Phosphatase: 57 IU/L (ref 48–121)
BUN/Creatinine Ratio: 9 (ref 9–23)
BUN: 8 mg/dL (ref 6–24)
Bilirubin Total: 0.2 mg/dL (ref 0.0–1.2)
CO2: 20 mmol/L (ref 20–29)
Calcium: 9.2 mg/dL (ref 8.7–10.2)
Chloride: 102 mmol/L (ref 96–106)
Creatinine, Ser: 0.86 mg/dL (ref 0.57–1.00)
GFR calc Af Amer: 96 mL/min/{1.73_m2} (ref >59)
GFR calc non Af Amer: 84 mL/min/{1.73_m2} (ref >59)
Globulin, Total: 2.7 g/dL (ref 1.5–4.5)
Glucose: 107 mg/dL — ABNORMAL HIGH (ref 65–99)
Potassium: 4.5 mmol/L (ref 3.5–5.2)
Sodium: 137 mmol/L (ref 134–144)
Total Protein: 7 g/dL (ref 6.0–8.5)

## 2019-10-12 LAB — HEMOGLOBIN A1C
Est. average glucose Bld gHb Est-mCnc: 114 mg/dL
Hgb A1c MFr Bld: 5.6 % (ref 4.8–5.6)

## 2019-11-15 ENCOUNTER — Ambulatory Visit (INDEPENDENT_AMBULATORY_CARE_PROVIDER_SITE_OTHER): Payer: Self-pay | Admitting: Obstetrics & Gynecology

## 2019-11-15 ENCOUNTER — Encounter: Payer: Self-pay | Admitting: Obstetrics & Gynecology

## 2019-11-15 ENCOUNTER — Other Ambulatory Visit: Payer: Self-pay

## 2019-11-15 ENCOUNTER — Other Ambulatory Visit (HOSPITAL_COMMUNITY)
Admission: RE | Admit: 2019-11-15 | Discharge: 2019-11-15 | Disposition: A | Discharge status: Home / Self Care | Payer: 59 | Source: Ambulatory Visit | Attending: Obstetrics & Gynecology | Admitting: Obstetrics & Gynecology

## 2019-11-15 VITALS — BP 120/87 | HR 82 | Ht 67.5 in | Wt 223.5 lb

## 2019-11-15 DIAGNOSIS — Z01419 Encounter for gynecological examination (general) (routine) without abnormal findings: Secondary | ICD-10-CM | POA: Insufficient documentation

## 2019-11-15 DIAGNOSIS — Z1239 Encounter for other screening for malignant neoplasm of breast: Secondary | ICD-10-CM

## 2019-11-15 DIAGNOSIS — N921 Excessive and frequent menstruation with irregular cycle: Secondary | ICD-10-CM

## 2019-11-15 DIAGNOSIS — Z113 Encounter for screening for infections with a predominantly sexual mode of transmission: Secondary | ICD-10-CM | POA: Diagnosis present

## 2019-11-15 MED ORDER — FLUCONAZOLE 150 MG PO TABS
150.0000 mg | ORAL_TABLET | ORAL | 1 refills | Status: AC
Start: 2019-11-15 — End: 2019-11-19

## 2019-11-15 MED ORDER — MEGESTROL ACETATE 40 MG PO TABS
40.0000 mg | ORAL_TABLET | Freq: Every day | ORAL | 3 refills | Status: AC
Start: 2019-11-15 — End: 2019-11-29

## 2019-11-15 NOTE — Progress Notes (Signed)
GYNECOLOGY ANNUAL PREVENTATIVE CARE ENCOUNTER NOTE  History:     Robin Fox is a 44 y.o. [phone] female here for a routine annual gynecologic exam.  Current complaints: worsened menorrhagia.  Heavy bleeding with passage of clots over past year.  Has to use 10-15 pads per day in 8-10 day cycle. Has intermenstrual bleeding, dyspareunia and dyschezia secondary to hemorrhoids.    Denies abn vaginal discharge, or any other gynecologic concerns.   Bleeding was previously improved on OCPs but due to smoking hx and cHTN on meds, OCPs were discontinued.    Gynecologic History Patient's last menstrual period was 11/02/2019 (approximate). Contraception: vasectomy Last Pap: 2018. Results were: normal with negative HPV Last mammogram: 2018. Results were: normal  Obstetric History OB History  Gravida Para Term Preterm AB Living  4 3 1 2  0 4  SAB TAB Ectopic Multiple Live Births  0 0 0 2 4    # Outcome Date GA Lbr Len/2nd Weight Sex Delivery Anes PTL Lv  4 Term 04/21/12 [redacted]w[redacted]d  8 lb 2.2 oz (3.69 kg) M CS-Vac Spinal  LIV  3A Preterm 08/15/06 [redacted]w[redacted]d  2 lb 1 oz (0.936 kg) M CS-Unspec Spinal N LIV     Birth Comments: Twin gestation; Gest Diabetes, PreX; IUGR for twin B - NO PRETERM LABOR C-Section  3B Preterm 08/15/06 [redacted]w[redacted]d  1 lb 1 oz (0.482 kg) M CS-Unspec Spinal Y LIV     Birth Comments: Twin gestation; Gest Diabetes, PreX; IUGR for twin B  2A Preterm 04/19/02 [redacted]w[redacted]d  5 lb 1 oz (2.296 kg) M CS-Unspec EPI N LIV     Birth Comments: Twin gestation 77 weeks one twin was still birth (female, 3lbs - knot) - NO PRETERM LABOR CSECTION FOR STILLBIRTH  2B Preterm 04/19/02 [redacted]w[redacted]d  3 lb (1.361 kg) M CS-Unspec EPI N FD     Birth Comments: Possible vertical incision > obtain records  1 Gravida             Past Medical History:  Diagnosis Date  . Anemia    hx   . Gestational diabetes    Neg 2 hr test after preg  . Headache(784.0)   . Herpes genitalia    2013 - 1st outbreak  . Hypertension    2008  .  Pre-eclampsia in third trimester   . Seasonal allergies   . Shortness of breath    w exertion  . Trichomonas   . Twins     Past Surgical History:  Procedure Laterality Date  . CESAREAN SECTION     X 2  . CESAREAN SECTION  04/21/2012   Procedure: CESAREAN SECTION;  Surgeon: Woodroe Mode, MD;  Location: [facility];  Service: Obstetrics;  Laterality: N/A;  . TOOTH EXTRACTION      Current Outpatient Medications on File Prior to Visit  Medication Sig Dispense Refill  . albuterol (VENTOLIN HFA) 108 (90 Base) MCG/ACT inhaler Inhale 1-2 puffs into the lungs every 6 (six) hours as needed for wheezing or shortness of breath. 18 g 0  . hydrochlorothiazide (HYDRODIURIL) 25 MG tablet Take 1 tablet (25 mg total) by mouth daily. 90 tablet 1  . Multiple Vitamin (MULTIVITAMIN) tablet Take 1 tablet by mouth daily.    . valACYclovir (VALTREX) 500 MG tablet Take 1 tablet (500 mg total) by mouth daily. Increase to twice per day for 3 days during outbreak. 90 tablet 3  . ferrous sulfate 325 (65 FE) MG tablet Take 325 mg by mouth  daily with breakfast. (Patient not taking: Reported on 11/15/2019)    . vitamin C (ASCORBIC ACID) 500 MG tablet Take 500 mg by mouth daily. (Patient not taking: Reported on 11/15/2019)     No current facility-administered medications on file prior to visit.    Allergies  Allergen Reactions  . Penicillins Hives, Shortness Of Breath and Swelling    Has patient had a PCN reaction causing immediate rash, facial/tongue/throat swelling, SOB or lightheadedness with hypotension: no Has patient had a PCN reaction causing severe rash involving mucus membranes or skin necrosis:no Has patient had a PCN reaction that required hospitalization no Has patient had a PCN reaction occurring within the last 10 years: {Yes If all of the above answers are "NO", then may proceed with Cephalosporin use.    Social History:  reports that she quit smoking about 2 months ago. Her smoking use included  cigarettes. She has a 4.25 pack-year smoking history. She has never used smokeless tobacco. She reports current alcohol use. She reports that she does not use drugs.  Family History  Problem Relation Age of Onset  . Hypertension Mother   . Stroke Mother   . Stroke Maternal Grandmother   . Heart disease Maternal Grandfather 60  . Hypertension Brother   . Diabetes Brother   . Stomach cancer Brother   . Hypertension Brother   . Diabetes Brother     The following portions of the patient's history were reviewed and updated as appropriate: allergies, current medications, past family history, past medical history, past social history, past surgical history and problem list.  Review of Systems Pertinent items noted in HPI and remainder of comprehensive ROS otherwise negative.  Physical Exam:  BP 120/87 (BP Location: Right Arm)   Pulse 82   Ht 5' 7.5" (1.715 m)   Wt 223 lb 8 oz (101.4 kg)   LMP 11/02/2019 (Approximate)   BMI 34.49 kg/m  CONSTITUTIONAL: Well-developed, well-nourished female in no acute distress.  HENT:  Normocephalic, atraumatic, External right and left ear normal. Oropharynx is clear and moist EYES: Conjunctivae and EOM are normal. Pupils are equal, round, and reactive to light. No scleral icterus.  NECK: Normal range of motion, supple, no masses.  Normal thyroid.  SKIN: Skin is warm and dry. No rash noted. Not diaphoretic. No erythema. No pallor. MUSCULOSKELETAL: Normal range of motion. No tenderness.  No cyanosis, clubbing, or edema.  2+ distal pulses. NEUROLOGIC: Alert and oriented to person, place, and time. Normal reflexes, muscle tone coordination.  PSYCHIATRIC: Normal mood and affect. Normal behavior. Normal judgment and thought content. CARDIOVASCULAR: Normal heart rate noted, regular rhythm RESPIRATORY: Clear to auscultation bilaterally. Effort and breath sounds normal, no problems with respiration noted. BREASTS: Symmetric in size. No masses, tenderness, skin  changes, nipple drainage, or lymphadenopathy bilaterally. Performed in the presence of a chaperone. ABDOMEN: Soft, no distention noted.  No tenderness, rebound or guarding.  PELVIC: Normal appearing external genitalia and urethral meatus; normal appearing vaginal mucosa and cervix.  No abnormal discharge noted.  Pap smear obtained.  Normal uterine size, no other palpable masses, no uterine or adnexal tenderness.  Performed in the presence of a chaperone.   Assessment and Plan:    1. Encounter for gynecological examination Plan for vaginal ultrasound, megace for menorrhagia. Concern for fibroids, adenomyosis, endometriosis.  - Cytology - PAP( [place])  2. Screening examination for STD (sexually transmitted disease) - Cytology - PAP( [place]) - HIV Antibody (routine testing w rflx) - [facility] - Hepatitis  B Surface AntiGEN - Hepatitis C Antibody  Will follow up results of pap smear and manage accordingly. Mammogram scheduled Routine preventative health maintenance measures emphasized. Please refer to After Visit Summary for other counseling recommendations.      Juanna Cao, MD Obstetrician & Gynecologist, [facility] for [employer], [place]

## 2019-11-15 NOTE — Patient Instructions (Signed)
Menorrhagia Menorrhagia is when your menstrual periods are heavy or last longer than normal. Follow these instructions at home: Medicines   Take over-the-counter and prescription medicines exactly as told by your doctor. This includes iron pills.  Do not change or switch medicines without asking your doctor.  Do not take aspirin or medicines that contain aspirin 1 week before or during your period. Aspirin may make bleeding worse. General instructions  If you need to change your pad or tampon more than once every 2 hours, limit your activity until the bleeding stops.  Iron pills can cause problems when pooping (constipation). To prevent or treat pooping problems while taking prescription iron pills, your doctor may suggest that you: ? Drink enough fluid to keep your pee (urine) clear or pale yellow. ? Take over-the-counter or prescription medicines. ? Eat foods that are high in fiber. These foods include:  Fresh fruits and vegetables.  Whole grains.  Beans. ? Limit foods that are high in fat and processed sugars. This includes fried and sweet foods.  Eat healthy meals and foods that are high in iron. Foods that have a lot of iron include: ? Leafy green vegetables. ? Meat. ? Liver. ? Eggs. ? Whole grain breads and cereals.  Do not try to lose weight until your heavy bleeding has stopped and you have normal amounts of iron in your blood. If you need to lose weight, work with your doctor.  Keep all follow-up visits as told by your doctor. This is important. Contact a doctor if:  You soak through a pad or tampon every 1 or 2 hours, and this happens every time you have a period.  You need to use pads and tampons at the same time because you are bleeding so much.  You are taking medicine and you: ? Feel sick to your stomach (nauseous). ? Throw up (vomit). ? Have watery poop (diarrhea).  You have other problems that may be related to the medicine you are taking. Get help  right away if:  You soak through more than a pad or tampon in 1 hour.  You pass clots bigger than 1 inch (2.5 cm) wide.  You feel short of breath.  You feel like your heart is beating too fast.  You feel dizzy or you pass out (faint).  You feel very weak or tired. Summary  Menorrhagia is when your menstrual periods are heavy or last longer than normal.  Take over-the-counter and prescription medicines exactly as told by your doctor. This includes iron pills.  Contact a doctor if you soak through more than a pad or tampon in 1 hour or are passing large clots. This information is not intended to replace advice given to you by your health care provider. Make sure you discuss any questions you have with your health care provider. Document Revised: 08/20/2017 Document Reviewed: 06/03/2016 Elsevier Patient Education  2020 Elsevier Inc.  

## 2019-11-16 LAB — CYTOLOGY - PAP
Chlamydia: NEGATIVE
Comment: NEGATIVE
Comment: NEGATIVE
Comment: NEGATIVE
Comment: NORMAL
Diagnosis: NEGATIVE
High risk HPV: NEGATIVE
Neisseria Gonorrhea: NEGATIVE
Trichomonas: NEGATIVE

## 2019-11-16 LAB — RPR: RPR Ser Ql: NONREACTIVE

## 2019-11-16 LAB — HEPATITIS B SURFACE ANTIGEN: Hepatitis B Surface Ag: NEGATIVE

## 2019-11-16 LAB — HEPATITIS C ANTIBODY: Hep C Virus Ab: <0.1 s/co ratio (ref 0.0–0.9)

## 2019-11-16 LAB — HIV ANTIBODY (ROUTINE TESTING W REFLEX): HIV Screen 4th Generation wRfx: NONREACTIVE

## 2019-12-10 ENCOUNTER — Other Ambulatory Visit: Payer: Self-pay

## 2019-12-10 ENCOUNTER — Ambulatory Visit
Admission: RE | Admit: 2019-12-10 | Discharge: 2019-12-10 | Disposition: A | Discharge status: Home / Self Care | Payer: 59 | Source: Ambulatory Visit | Attending: Obstetrics & Gynecology | Admitting: Obstetrics & Gynecology

## 2019-12-10 DIAGNOSIS — N921 Excessive and frequent menstruation with irregular cycle: Secondary | ICD-10-CM | POA: Insufficient documentation

## 2019-12-13 ENCOUNTER — Telehealth: Payer: Self-pay | Admitting: Family Medicine

## 2019-12-13 NOTE — Telephone Encounter (Signed)
Patient requesting a call want to discuss her test results.

## 2019-12-17 NOTE — Telephone Encounter (Signed)
Called patient, no answer- left message stating all her results were normal and if she has questions to call us back.

## 2019-12-18 ENCOUNTER — Other Ambulatory Visit: Payer: Self-pay

## 2019-12-18 ENCOUNTER — Ambulatory Visit (INDEPENDENT_AMBULATORY_CARE_PROVIDER_SITE_OTHER): Payer: 59

## 2019-12-18 ENCOUNTER — Encounter (HOSPITAL_COMMUNITY): Payer: Self-pay

## 2019-12-18 ENCOUNTER — Ambulatory Visit (HOSPITAL_COMMUNITY)
Admission: EM | Admit: 2019-12-18 | Discharge: 2019-12-18 | Disposition: A | Discharge status: Home / Self Care | Payer: 59 | Attending: Family Medicine | Admitting: Family Medicine

## 2019-12-18 DIAGNOSIS — M79642 Pain in left hand: Secondary | ICD-10-CM

## 2019-12-18 DIAGNOSIS — W19XXXA Unspecified fall, initial encounter: Secondary | ICD-10-CM

## 2019-12-18 DIAGNOSIS — M7989 Other specified soft tissue disorders: Secondary | ICD-10-CM

## 2019-12-18 MED ORDER — IBUPROFEN 800 MG PO TABS: 800.0000 mg | ORAL_TABLET | Freq: Three times a day (TID) | ORAL | 0 refills | Status: DC | PRN

## 2019-12-18 MED ORDER — CYCLOBENZAPRINE HCL 10 MG PO TABS
10.0000 mg | ORAL_TABLET | Freq: Two times a day (BID) | ORAL | 0 refills | Status: DC | PRN
Start: 2019-12-18 — End: 2020-06-22

## 2019-12-18 NOTE — ED Provider Notes (Signed)
[facility]   [id number] 12/18/19 Arrival Time: 1139  OE:VOJJK PAIN  SUBJECTIVE: History from: patient. Robin Fox is a 44 y.o. female complains of left hand pain that began last night.  Reports that she states that she fell in the bathroom and caught her self with her left hand.  Reports that she has taken ibuprofen with little relief.  Has not used ice, has not used other medications for this.  Describes the pain as intermittent and worse with activity. Denies similar symptoms in the past.  Denies fever, chills, erythema, ecchymosis, effusion, weakness, numbness and tingling, saddle paresthesias, loss of bowel or bladder function.      ROS: As per HPI.  All other pertinent ROS negative.     Past Medical History:  Diagnosis Date  . Anemia    hx   . Gestational diabetes    Neg 2 hr test after preg  . Headache(784.0)   . Herpes genitalia    2013 - 1st outbreak  . Hypertension    2008  . Pre-eclampsia in third trimester   . Seasonal allergies   . Shortness of breath    w exertion  . Trichomonas   . Twins    Past Surgical History:  Procedure Laterality Date  . CESAREAN SECTION     X 2  . CESAREAN SECTION  04/21/2012   Procedure: CESAREAN SECTION;  Surgeon: Woodroe Mode, MD;  Location: [facility];  Service: Obstetrics;  Laterality: N/A;  . TOOTH EXTRACTION     Allergies  Allergen Reactions  . Penicillins Hives, Shortness Of Breath and Swelling    Has patient had a PCN reaction causing immediate rash, facial/tongue/throat swelling, SOB or lightheadedness with hypotension: no Has patient had a PCN reaction causing severe rash involving mucus membranes or skin necrosis:no Has patient had a PCN reaction that required hospitalization no Has patient had a PCN reaction occurring within the last 10 years: {Yes If all of the above answers are "NO", then may proceed with Cephalosporin use.   No current facility-administered medications on file prior to encounter.    Current Outpatient Medications on File Prior to Encounter  Medication Sig Dispense Refill  . albuterol (VENTOLIN HFA) 108 (90 Base) MCG/ACT inhaler Inhale 1-2 puffs into the lungs every 6 (six) hours as needed for wheezing or shortness of breath. 18 g 0  . ferrous sulfate 325 (65 FE) MG tablet Take 325 mg by mouth daily with breakfast. (Patient not taking: Reported on 11/15/2019)    . hydrochlorothiazide (HYDRODIURIL) 25 MG tablet Take 1 tablet (25 mg total) by mouth daily. 90 tablet 1  . Multiple Vitamin (MULTIVITAMIN) tablet Take 1 tablet by mouth daily.    . valACYclovir (VALTREX) 500 MG tablet Take 1 tablet (500 mg total) by mouth daily. Increase to twice per day for 3 days during outbreak. 90 tablet 3  . vitamin C (ASCORBIC ACID) 500 MG tablet Take 500 mg by mouth daily. (Patient not taking: Reported on 11/15/2019)     Social History   Socioeconomic History  . Marital status: Married    Spouse name: Not on file  . Number of children: Not on file  . Years of education: Not on file  . Highest education level: Not on file  Occupational History  . Not on file  Tobacco Use  . Smoking status: Former Smoker    Packs/day: 0.25    Years: 17.00    Pack years: 4.25    Types: Cigarettes  Quit date: 09/11/2019    Years since quitting: 0.2  . Smokeless tobacco: Never Used  Vaping Use  . Vaping Use: Never used  Substance and Sexual Activity  . Alcohol use: Yes    Comment: socially  . Drug use: No  . Sexual activity: Yes    Birth control/protection: Other-see comments, None    Comment: Partner had vasectomy-1st intercourse 72 yo-More than 5 partners  Other Topics Concern  . Not on file  Social History Narrative  . Not on file   Social Determinants of Health   Financial Resource Strain:   . Difficulty of Paying Living Expenses:   Food Insecurity: No Food Insecurity  . Worried About Charity fundraiser in the Last Year: Never true  . Ran Out of Food in the Last Year: Never true   Transportation Needs: No Transportation Needs  . Lack of Transportation (Medical): No  . Lack of Transportation (Non-Medical): No  Physical Activity:   . Days of Exercise per Week:   . Minutes of Exercise per Session:   Stress:   . Feeling of Stress :   Social Connections:   . Frequency of Communication with Friends and Family:   . Frequency of Social Gatherings with Friends and Family:   . Attends Religious Services:   . Active Member of Clubs or Organizations:   . Attends Archivist Meetings:   Marland Kitchen Marital Status:   Intimate Partner Violence:   . Fear of Current or Ex-Partner:   . Emotionally Abused:   Marland Kitchen Physically Abused:   . Sexually Abused:    Family History  Problem Relation Age of Onset  . Hypertension Mother   . Stroke Mother   . Stroke Maternal Grandmother   . Heart disease Maternal Grandfather 60  . Hypertension Brother   . Diabetes Brother   . Stomach cancer Brother   . Hypertension Brother   . Diabetes Brother     OBJECTIVE:  Vitals:   12/18/19 1350  BP: (!) 133/95  Pulse: 82  Resp: 18  Temp: 98.7 F (37.1 C)  TempSrc: Oral  SpO2: 99%    General appearance: ALERT; in no acute distress.  Head: NCAT Lungs: Normal respiratory effort CV:  pulses 2+ bilaterally. Cap refill < 2 seconds Musculoskeletal:  Inspection: Skin warm, dry, clear and intact without obvious erythema, effusion, or ecchymosis.  Palpation: lateral metatarsals to left handtender to palpation ROM: FROM active and passive Skin: warm and dry Neurologic: Ambulates without difficulty; Sensation intact about the upper/ lower extremities Psychological: alert and cooperative; normal mood and affect  DIAGNOSTIC STUDIES:  DG Hand Complete Left  Result Date: 12/18/2019 CLINICAL DATA:  Left hand pain with limited range of motion and swelling after falling in shower 1 day ago. EXAM: LEFT HAND - COMPLETE 3+ VIEW COMPARISON:  None. FINDINGS: The mineralization and alignment are normal.  There is no evidence of acute fracture or dislocation. The joint spaces are preserved. No focal soft tissue swelling or foreign body identified. IMPRESSION: Negative left hand radiographs. Electronically Signed   By: Richardean Sale M.D.   On: 12/18/2019 14:44     ASSESSMENT & PLAN:  1. Left hand pain   2. Fall, initial encounter     Meds ordered this encounter  Medications  . ibuprofen (ADVIL) 800 MG tablet    Sig: Take 1 tablet (800 mg total) by mouth every 8 (eight) hours as needed for moderate pain.    Dispense:  21 tablet  Refill:  0    Order Specific Question:   Supervising Provider    Answer:   Chase Picket [id number]  . cyclobenzaprine (FLEXERIL) 10 MG tablet    Sig: Take 1 tablet (10 mg total) by mouth 2 (two) times daily as needed for muscle spasms.    Dispense:  20 tablet    Refill:  0    Order Specific Question:   Supervising Provider    Answer:   Chase Picket [[id number]]   X-ray negative Continue conservative management of rest, ice, and gentle stretches Take ibuprofen as needed for pain relief (may cause abdominal discomfort, ulcers, and GI bleeds avoid taking with other NSAIDs) Take cyclobenzaprine at nighttime for symptomatic relief. Avoid driving or operating heavy machinery while using medication. Follow up with PCP if symptoms persist Return or go to the ER if you have any new or worsening symptoms (fever, chills, chest pain, abdominal pain, changes in bowel or bladder habits, pain radiating into lower legs)   Reviewed expectations re: course of current medical issues. Questions answered. Outlined signs and symptoms indicating need for more acute intervention. Patient verbalized understanding. After Visit Summary given.       [name], NP 12/19/19 1003

## 2019-12-18 NOTE — ED Triage Notes (Signed)
Pt present she fall when getting out the shower this am and injured her left wrist. Pt is unable to mover her left wrist..

## 2019-12-18 NOTE — Discharge Instructions (Addendum)
I am waiting for radiology to read your xray. I will call you if there are any fractures or dislocations, I personally do not see any at this time  Take the ibuprofen as prescribed.  Rest and elevate your hand.  Apply ice packs 2-3 times a day for up to 20 minutes each.  Wear the Ace wrap as needed for comfort.    Take the cyclobenzaprine twice daily for spasms as needed. This medication can make you sleepy. Do not drive or operate heavy machinery while taking this medication.  Follow up with your primary care provider or an orthopedist if you symptoms continue or worsen;  Or if you develop new symptoms, such as numbness, tingling, or weakness.

## 2019-12-21 ENCOUNTER — Encounter (HOSPITAL_COMMUNITY): Payer: Self-pay

## 2019-12-21 ENCOUNTER — Ambulatory Visit (HOSPITAL_COMMUNITY)
Admission: EM | Admit: 2019-12-21 | Discharge: 2019-12-21 | Disposition: A | Discharge status: Home / Self Care | Payer: 59 | Attending: Urgent Care | Admitting: Urgent Care

## 2019-12-21 DIAGNOSIS — R21 Rash and other nonspecific skin eruption: Secondary | ICD-10-CM | POA: Diagnosis not present

## 2019-12-21 DIAGNOSIS — L509 Urticaria, unspecified: Secondary | ICD-10-CM

## 2019-12-21 MED ORDER — HYDROXYZINE HCL 25 MG PO TABS: 12.5000 mg | ORAL_TABLET | Freq: Three times a day (TID) | ORAL | 0 refills | Status: DC | PRN

## 2019-12-21 MED ORDER — PREDNISONE 20 MG PO TABS
ORAL_TABLET | ORAL | 0 refills | Status: DC
Start: 2019-12-21 — End: 2020-05-05

## 2019-12-21 NOTE — Discharge Instructions (Addendum)
Talk with your gynecologist about continuing to use megestrol. I am concerned that this new medication is causing your hives. In the meantime, start hydroxyzine, 1/2-1 tablet up to 3 times daily as needed. If this does not help and you are still taking megestrol then start prednisone. Its a 5 day course, take 2 tabs with breakfast. At that point, I highly recommend you stopping megestrol until you can recheck with your gynecologist.

## 2019-12-21 NOTE — ED Triage Notes (Signed)
Pt presents to UC for possible hives since Sunday. Pt states she has rash present on arms, legs, vagina, head, neck. Pt states rash is itchy and burns. Pt states she changed her soap a couple weeks ago. Denies other irritants or allergens. Pt has been treating with benadryl with out relief.

## 2019-12-21 NOTE — ED Provider Notes (Signed)
[place]   MRN: [id number] DOB: 1976-02-06  Subjective:   Robin Fox is a 44 y.o. female presenting for 3-day history of persistent itching and intermittent hives all over her body.  Patient states that the hives come and go and do not stay around but for few hours, respond to Benadryl.  However, patient has done much better with hydroxyzine in the past and would like to try this.  Has kept her routine the same except for starting megestrol in the past month.  Patient states that she initially took it for 2 weeks and then stopped prior to restarting this past week and noticed the hives came out.  No current facility-administered medications for this encounter.  Current Outpatient Medications:  .  albuterol (VENTOLIN HFA) 108 (90 Base) MCG/ACT inhaler, Inhale 1-2 puffs into the lungs every 6 (six) hours as needed for wheezing or shortness of breath., Disp: 18 g, Rfl: 0 .  cyclobenzaprine (FLEXERIL) 10 MG tablet, Take 1 tablet (10 mg total) by mouth 2 (two) times daily as needed for muscle spasms., Disp: 20 tablet, Rfl: 0 .  ferrous sulfate 325 (65 FE) MG tablet, Take 325 mg by mouth daily with breakfast. (Patient not taking: Reported on 11/15/2019), Disp: , Rfl:  .  hydrochlorothiazide (HYDRODIURIL) 25 MG tablet, Take 1 tablet (25 mg total) by mouth daily., Disp: 90 tablet, Rfl: 1 .  ibuprofen (ADVIL) 800 MG tablet, Take 1 tablet (800 mg total) by mouth every 8 (eight) hours as needed for moderate pain., Disp: 21 tablet, Rfl: 0 .  Multiple Vitamin (MULTIVITAMIN) tablet, Take 1 tablet by mouth daily., Disp: , Rfl:  .  valACYclovir (VALTREX) 500 MG tablet, Take 1 tablet (500 mg total) by mouth daily. Increase to twice per day for 3 days during outbreak., Disp: 90 tablet, Rfl: 3 .  vitamin C (ASCORBIC ACID) 500 MG tablet, Take 500 mg by mouth daily. (Patient not taking: Reported on 11/15/2019), Disp: , Rfl:    Allergies  Allergen Reactions  . Penicillins Hives, Shortness Of Breath  and Swelling    Has patient had a PCN reaction causing immediate rash, facial/tongue/throat swelling, SOB or lightheadedness with hypotension: no Has patient had a PCN reaction causing severe rash involving mucus membranes or skin necrosis:no Has patient had a PCN reaction that required hospitalization no Has patient had a PCN reaction occurring within the last 10 years: {Yes If all of the above answers are "NO", then may proceed with Cephalosporin use.    Past Medical History:  Diagnosis Date  . Anemia    hx   . Gestational diabetes    Neg 2 hr test after preg  . Headache(784.0)   . Herpes genitalia    2013 - 1st outbreak  . Hypertension    2008  . Pre-eclampsia in third trimester   . Seasonal allergies   . Shortness of breath    w exertion  . Trichomonas   . Twins      Past Surgical History:  Procedure Laterality Date  . CESAREAN SECTION     X 2  . CESAREAN SECTION  04/21/2012   Procedure: CESAREAN SECTION;  Surgeon: Woodroe Mode, MD;  Location: [facility];  Service: Obstetrics;  Laterality: N/A;  . TOOTH EXTRACTION      Family History  Problem Relation Age of Onset  . Hypertension Mother   . Stroke Mother   . Stroke Maternal Grandmother   . Heart disease Maternal Grandfather 60  . Hypertension Brother   .  Diabetes Brother   . Stomach cancer Brother   . Hypertension Brother   . Diabetes Brother     Social History   Tobacco Use  . Smoking status: Former Smoker    Packs/day: 0.25    Years: 17.00    Pack years: 4.25    Types: Cigarettes    Quit date: 09/11/2019    Years since quitting: 0.2  . Smokeless tobacco: Never Used  Vaping Use  . Vaping Use: Never used  Substance Use Topics  . Alcohol use: Yes    Comment: socially  . Drug use: No    ROS   Objective:   Vitals: BP (!) 133/72 (BP Location: Right Arm)   Pulse 86   Temp 98.1 F (36.7 C) (Oral)   Resp 16   LMP 11/02/2019 (Exact Date)   SpO2 100%   Physical Exam Constitutional:       General: She is not in acute distress.    Appearance: Normal appearance. She is well-developed. She is not ill-appearing, toxic-appearing or diaphoretic.  HENT:     Head: Normocephalic and atraumatic.     Nose: Nose normal.     Mouth/Throat:     Mouth: Mucous membranes are moist.  Eyes:     Extraocular Movements: Extraocular movements intact.     Pupils: Pupils are equal, round, and reactive to light.  Cardiovascular:     Rate and Rhythm: Normal rate and regular rhythm.     Pulses: Normal pulses.     Heart sounds: Normal heart sounds. No murmur heard.  No friction rub. No gallop.   Pulmonary:     Effort: Pulmonary effort is normal. No respiratory distress.     Breath sounds: Normal breath sounds. No stridor. No wheezing, rhonchi or rales.  Skin:    General: Skin is warm and dry.     Findings: Rash (scant urticarial lesions on forearms) present.  Neurological:     Mental Status: She is alert and oriented to person, place, and time.  Psychiatric:        Mood and Affect: Mood normal.        Behavior: Behavior normal.        Thought Content: Thought content normal.        Judgment: Judgment normal.      Assessment and Plan :   PDMP not reviewed this encounter.  1. Rash and nonspecific skin eruption   2. Urticaria     Emphasized need to revisit continuing megestrol. Start hydroxyzine, provided with script for prednisone in case her sx worsen. Counseled patient on potential for adverse effects with medications prescribed/recommended today, ER and return-to-clinic precautions discussed, patient verbalized understanding.    Jaynee Eagles, [place] 12/21/19 2036

## 2020-02-21 ENCOUNTER — Other Ambulatory Visit: Payer: Self-pay | Admitting: Family Medicine

## 2020-02-21 DIAGNOSIS — A6000 Herpesviral infection of urogenital system, unspecified: Secondary | ICD-10-CM

## 2020-03-29 ENCOUNTER — Emergency Department (HOSPITAL_COMMUNITY)
Admission: EM | Admit: 2020-03-29 | Discharge: 2020-03-30 | Disposition: A | Discharge status: Home / Self Care | Payer: 59 | Attending: Emergency Medicine | Admitting: Emergency Medicine

## 2020-03-29 ENCOUNTER — Emergency Department (HOSPITAL_COMMUNITY): Payer: 59

## 2020-03-29 ENCOUNTER — Other Ambulatory Visit: Payer: Self-pay

## 2020-03-29 ENCOUNTER — Encounter (HOSPITAL_COMMUNITY): Payer: Self-pay | Admitting: Emergency Medicine

## 2020-03-29 DIAGNOSIS — Z79899 Other long term (current) drug therapy: Secondary | ICD-10-CM | POA: Diagnosis not present

## 2020-03-29 DIAGNOSIS — Z87891 Personal history of nicotine dependence: Secondary | ICD-10-CM | POA: Diagnosis not present

## 2020-03-29 DIAGNOSIS — Z20822 Contact with and (suspected) exposure to covid-19: Secondary | ICD-10-CM | POA: Diagnosis not present

## 2020-03-29 DIAGNOSIS — R059 Cough, unspecified: Secondary | ICD-10-CM | POA: Diagnosis not present

## 2020-03-29 DIAGNOSIS — I10 Essential (primary) hypertension: Secondary | ICD-10-CM | POA: Diagnosis not present

## 2020-03-29 LAB — CBC WITH DIFFERENTIAL/PLATELET
Abs Immature Granulocytes: 0.02 10*3/uL (ref 0.00–0.07)
Basophils Absolute: 0 10*3/uL (ref 0.0–0.1)
Basophils Relative: 0 %
Eosinophils Absolute: 0.2 10*3/uL (ref 0.0–0.5)
Eosinophils Relative: 2 %
HCT: 43.4 % (ref 36.0–46.0)
Hemoglobin: 13.2 g/dL (ref 12.0–15.0)
Immature Granulocytes: 0 %
Lymphocytes Relative: 35 %
Lymphs Abs: 3.1 10*3/uL (ref 0.7–4.0)
MCH: 21.6 pg — ABNORMAL LOW (ref 26.0–34.0)
MCHC: 30.4 g/dL (ref 30.0–36.0)
MCV: 71.1 fL — ABNORMAL LOW (ref 80.0–100.0)
Monocytes Absolute: 0.5 10*3/uL (ref 0.1–1.0)
Monocytes Relative: 6 %
Neutro Abs: 5.1 10*3/uL (ref 1.7–7.7)
Neutrophils Relative %: 57 %
Platelets: 345 10*3/uL (ref 150–400)
RBC: 6.1 MIL/uL — ABNORMAL HIGH (ref 3.87–5.11)
RDW: 18.9 % — ABNORMAL HIGH (ref 11.5–15.5)
WBC: 9 10*3/uL (ref 4.0–10.5)
nRBC: 0 % (ref 0.0–0.2)

## 2020-03-29 LAB — I-STAT BETA HCG BLOOD, ED (MC, WL, AP ONLY): I-stat hCG, quantitative: <5 m[IU]/mL (ref <5)

## 2020-03-29 LAB — BASIC METABOLIC PANEL
Anion gap: 11 (ref 5–15)
BUN: 12 mg/dL (ref 6–20)
CO2: 27 mmol/L (ref 22–32)
Calcium: 9.7 mg/dL (ref 8.9–10.3)
Chloride: 99 mmol/L (ref 98–111)
Creatinine, Ser: 0.94 mg/dL (ref 0.44–1.00)
GFR, Estimated: >60 mL/min (ref >60)
Glucose, Bld: 127 mg/dL — ABNORMAL HIGH (ref 70–99)
Potassium: 3.4 mmol/L — ABNORMAL LOW (ref 3.5–5.1)
Sodium: 137 mmol/L (ref 135–145)

## 2020-03-29 NOTE — ED Triage Notes (Signed)
Patient reports chest congestion with productive cough and blood tinged phlegm this evening , respirations unlabored , denies fever or chills .

## 2020-03-30 ENCOUNTER — Emergency Department (HOSPITAL_COMMUNITY): Payer: 59

## 2020-03-30 ENCOUNTER — Encounter (HOSPITAL_COMMUNITY): Payer: Self-pay | Admitting: Emergency Medicine

## 2020-03-30 ENCOUNTER — Ambulatory Visit
Admission: RE | Admit: 2020-03-30 | Discharge: 2020-03-30 | Disposition: A | Discharge status: Home / Self Care | Payer: 59 | Source: Ambulatory Visit | Attending: Obstetrics & Gynecology | Admitting: Obstetrics & Gynecology

## 2020-03-30 DIAGNOSIS — Z1239 Encounter for other screening for malignant neoplasm of breast: Secondary | ICD-10-CM

## 2020-03-30 LAB — RESPIRATORY PANEL BY RT PCR (FLU A&B, COVID)
Influenza A by PCR: NEGATIVE
Influenza B by PCR: NEGATIVE
SARS Coronavirus 2 by RT PCR: NEGATIVE

## 2020-03-30 MED ORDER — IOHEXOL 350 MG/ML SOLN
75.0000 mL | Freq: Once | INTRAVENOUS | Status: AC | PRN
  Administered 2020-03-30: 75 mL via INTRAVENOUS

## 2020-03-30 MED ORDER — ALUM & MAG HYDROXIDE-SIMETH 200-200-20 MG/5ML PO SUSP
30.0000 mL | Freq: Once | ORAL | Status: AC
  Administered 2020-03-30: 30 mL via ORAL
  Filled 2020-03-30: qty 30

## 2020-03-30 NOTE — ED Provider Notes (Signed)
[facility] EMERGENCY DEPARTMENT Provider Note   CSN: [id number] Arrival date & time: 03/29/20  2141     History Chief Complaint  Patient presents with  . Cough    Robin Fox is a 44 y.o. female.  The history is provided by the patient.  Cough Cough characteristics:  Productive Sputum characteristics:  Bloody Severity:  Mild Onset quality:  Sudden Timing:  Rare Progression:  Resolved Chronicity:  New Context: not animal exposure, not exposure to allergens and not fumes   Relieved by:  Nothing Worsened by:  Nothing Ineffective treatments:  None tried Associated symptoms: no chest pain, no chills, no diaphoresis, no ear fullness, no ear pain, no eye discharge, no fever, no headaches, no myalgias, no rash, no rhinorrhea, no shortness of breath, no sinus congestion, no sore throat, no weight loss and no wheezing   Risk factors: no chemical exposure        Past Medical History:  Diagnosis Date  . Anemia    hx   . Gestational diabetes    Neg 2 hr test after preg  . Headache(784.0)   . Herpes genitalia    2013 - 1st outbreak  . Hypertension    2008  . Pre-eclampsia in third trimester   . Seasonal allergies   . Shortness of breath    w exertion  . Trichomonas   . Twins     Patient Active Problem List   Diagnosis Date Noted  . Screening breast examination 03/30/2019  . Breast pain 03/30/2019  . Tobacco use 08/01/2016  . Routine postpartum follow-up 05/22/2012  . Mild or unspecified pre-eclampsia, with delivery 04/07/2012  . Abnormal maternal glucose tolerance, antepartum 03/27/2012  . History of IUGR (intrauterine growth retardation) and stillbirth, currently pregnant 02/12/2012  . History of stillbirth 12/12/2011  . Previous cesarean delivery, antepartum condition or complication 28/36/6294  . Obesity (BMI 30-39.9) 11/21/2011  . Hx of herpes genitalis 11/21/2011  . Hypertension in pregnancy, essential, antepartum 11/09/2011  .  Essential hypertension 11/09/2011  . AMA (advanced maternal age) multigravida 35+ 11/09/2011    Past Surgical History:  Procedure Laterality Date  . CESAREAN SECTION     X 2  . CESAREAN SECTION  04/21/2012   Procedure: CESAREAN SECTION;  Surgeon: Woodroe Mode, MD;  Location: [facility];  Service: Obstetrics;  Laterality: N/A;  . TOOTH EXTRACTION       OB History    Gravida  4   Para  3   Term  1   Preterm  2   AB  0   Living  4     SAB  0   TAB  0   Ectopic  0   Multiple  2   Live Births  4           Family History  Problem Relation Age of Onset  . Hypertension Mother   . Stroke Mother   . Stroke Maternal Grandmother   . Heart disease Maternal Grandfather 60  . Hypertension Brother   . Diabetes Brother   . Stomach cancer Brother   . Hypertension Brother   . Diabetes Brother     Social History   Tobacco Use  . Smoking status: Former Smoker    Packs/day: 0.25    Years: 17.00    Pack years: 4.25    Types: Cigarettes    Quit date: 09/11/2019    Years since quitting: 0.5  . Smokeless tobacco: Never Used  Vaping Use  .  Vaping Use: Never used  Substance Use Topics  . Alcohol use: Yes    Comment: socially  . Drug use: No    Home Medications Prior to Admission medications   Medication Sig Start Date End Date Taking? Authorizing Provider  albuterol (VENTOLIN HFA) 108 (90 Base) MCG/ACT inhaler Inhale 1-2 puffs into the lungs every 6 (six) hours as needed for wheezing or shortness of breath. 02/25/19   Loura Halt A, NP  cyclobenzaprine (FLEXERIL) 10 MG tablet Take 1 tablet (10 mg total) by mouth 2 (two) times daily as needed for muscle spasms. 12/18/19   [name], NP  ferrous sulfate 325 (65 FE) MG tablet Take 325 mg by mouth daily with breakfast. Patient not taking: Reported on 11/15/2019    [provider]  hydrochlorothiazide (HYDRODIURIL) 25 MG tablet Take 1 tablet (25 mg total) by mouth daily. 09/27/19   [name], MD    hydrOXYzine (ATARAX/VISTARIL) 25 MG tablet Take 0.5-1 tablets (12.5-25 mg total) by mouth every 8 (eight) hours as needed for itching. 12/21/19   Jaynee Eagles, PA-C  ibuprofen (ADVIL) 800 MG tablet Take 1 tablet (800 mg total) by mouth every 8 (eight) hours as needed for moderate pain. 12/18/19   [name], NP  Multiple Vitamin (MULTIVITAMIN) tablet Take 1 tablet by mouth daily.    [provider]  predniSONE (DELTASONE) 20 MG tablet Take 2 tablets daily with breakfast. 12/21/19   Jaynee Eagles, PA-C  valACYclovir (VALTREX) 500 MG tablet TAKE 1 TABLET BY MOUTH ONCE DAILY INCREASE  TO  TWICE  DAILY  FOR  3  DAYS  DURING  OUTBREAK 02/21/20   [name], MD  vitamin C (ASCORBIC ACID) 500 MG tablet Take 500 mg by mouth daily. Patient not taking: Reported on 11/15/2019    [provider]    Allergies    Penicillins  Review of Systems   Review of Systems  Constitutional: Negative for chills, diaphoresis, fever and weight loss.  HENT: Negative for ear pain, rhinorrhea and sore throat.   Eyes: Negative for discharge.  Respiratory: Positive for cough. Negative for shortness of breath and wheezing.   Cardiovascular: Negative for chest pain.  Gastrointestinal: Negative for abdominal pain, nausea and vomiting.  Genitourinary: Negative for difficulty urinating.  Musculoskeletal: Negative for myalgias.  Skin: Negative for rash.  Neurological: Negative for headaches.  Psychiatric/Behavioral: Negative for agitation.  All other systems reviewed and are negative.   Physical Exam Updated Vital Signs BP (!) 133/96 (BP Location: Right Arm)   Pulse 73   Temp 98.7 F (37.1 C) (Oral)   Resp 19   Ht 5\' 7"  (1.702 m)   Wt 115 kg   LMP 03/28/2020   SpO2 100%   BMI 39.71 kg/m   Physical Exam Vitals and nursing note reviewed.  Constitutional:      General: She is not in acute distress.    Appearance: Normal appearance.  HENT:     Head: Normocephalic and atraumatic.      Nose: Nose normal.  Eyes:     Conjunctiva/sclera: Conjunctivae normal.     Pupils: Pupils are equal, round, and reactive to light.  Cardiovascular:     Rate and Rhythm: Normal rate and regular rhythm.     Pulses: Normal pulses.     Heart sounds: Normal heart sounds.  Pulmonary:     Effort: Pulmonary effort is normal.     Breath sounds: Normal breath sounds.  Abdominal:     General: Abdomen  is flat. Bowel sounds are normal.     Palpations: Abdomen is soft.     Tenderness: There is no abdominal tenderness. There is no guarding.  Musculoskeletal:        General: Normal range of motion.     Cervical back: Normal range of motion and neck supple.  Skin:    General: Skin is warm and dry.     Capillary Refill: Capillary refill takes less than 2 seconds.  Neurological:     General: No focal deficit present.     Mental Status: She is alert and oriented to person, place, and time.     Deep Tendon Reflexes: Reflexes normal.  Psychiatric:        Mood and Affect: Mood normal.        Behavior: Behavior normal.     ED Results / Procedures / Treatments   Labs (all labs ordered are listed, but only abnormal results are displayed) Results for orders placed or performed during the hospital encounter of 03/29/20  Respiratory Panel by RT PCR (Flu A&B, Covid) - Nasopharyngeal Swab   Specimen: Nasopharyngeal Swab  Result Value Ref Range   SARS Coronavirus 2 by RT PCR NEGATIVE NEGATIVE   Influenza A by PCR NEGATIVE NEGATIVE   Influenza B by PCR NEGATIVE NEGATIVE  CBC with Differential  Result Value Ref Range   WBC 9.0 4.0 - 10.5 K/uL   RBC 6.10 (H) 3.87 - 5.11 MIL/uL   Hemoglobin 13.2 12.0 - 15.0 g/dL   HCT 43.4 36 - 46 %   MCV 71.1 (L) 80.0 - 100.0 fL   MCH 21.6 (L) 26.0 - 34.0 pg   MCHC 30.4 30.0 - 36.0 g/dL   RDW 18.9 (H) 11.5 - 15.5 %   Platelets 345 150 - 400 K/uL   nRBC 0.0 0.0 - 0.2 %   Neutrophils Relative % 57 %   Neutro Abs 5.1 1.7 - 7.7 K/uL   Lymphocytes Relative 35 %    Lymphs Abs 3.1 0.7 - 4.0 K/uL   Monocytes Relative 6 %   Monocytes Absolute 0.5 0.1 - 1.0 K/uL   Eosinophils Relative 2 %   Eosinophils Absolute 0.2 0.0 - 0.5 K/uL   Basophils Relative 0 %   Basophils Absolute 0.0 0.0 - 0.1 K/uL   Immature Granulocytes 0 %   Abs Immature Granulocytes 0.02 0.00 - 0.07 K/uL  Basic metabolic panel  Result Value Ref Range   Sodium 137 135 - 145 mmol/L   Potassium 3.4 (L) 3.5 - 5.1 mmol/L   Chloride 99 98 - 111 mmol/L   CO2 27 22 - 32 mmol/L   Glucose, Bld 127 (H) 70 - 99 mg/dL   BUN 12 6 - 20 mg/dL   Creatinine, Ser 0.94 0.44 - 1.00 mg/dL   Calcium 9.7 8.9 - 10.3 mg/dL   GFR, Estimated >60 >60 mL/min   Anion gap 11 5 - 15  I-Stat Beta hCG blood, ED ([facility], [facility], [facility] only)  Result Value Ref Range   I-stat hCG, quantitative <5.0 <5 mIU/mL   Comment 3           DG Chest 2 View  Result Date: 03/29/2020 CLINICAL DATA:  Cough and congestion.  Chest pain. EXAM: CHEST - 2 VIEW COMPARISON:  02/25/2019 FINDINGS: The cardiomediastinal contours are normal. The lungs are clear. Pulmonary vasculature is normal. No consolidation, pleural effusion, or pneumothorax. No acute osseous abnormalities are seen. IMPRESSION: No acute chest findings. Electronically Signed   By: Threasa Beards  [place] M.D.   On: 03/29/2020 22:18   CT Angio Chest PE W and/or Wo Contrast  Result Date: 03/30/2020 CLINICAL DATA:  Chest pain, shortness of breath, congestion, cough EXAM: CT ANGIOGRAPHY CHEST WITH CONTRAST TECHNIQUE: Multidetector CT imaging of the chest was performed using the standard protocol during bolus administration of intravenous contrast. Multiplanar CT image reconstructions and MIPs were obtained to evaluate the vascular anatomy. CONTRAST:  31mL OMNIPAQUE IOHEXOL 350 MG/ML SOLN COMPARISON:  None. FINDINGS: Cardiovascular: No filling defects in the pulmonary arteries to suggest pulmonary emboli. Heart is normal size. Aorta is normal caliber. Mediastinum/Nodes: No mediastinal, hilar, or  axillary adenopathy. Trachea and esophagus are unremarkable. Thyroid unremarkable. Small hiatal hernia. Lungs/Pleura: Lungs are clear. No focal airspace opacities or suspicious nodules. No effusions. Upper Abdomen: Imaging into the upper abdomen demonstrates no acute findings. Musculoskeletal: Chest wall soft tissues are unremarkable. No acute bony abnormality. Review of the MIP images confirms the above findings. IMPRESSION: No evidence of pulmonary embolus. No acute cardiopulmonary disease. Electronically Signed   By: Rolm Baptise M.D.   On: 03/30/2020 00:55    EKG None  Radiology DG Chest 2 View  Result Date: 03/29/2020 CLINICAL DATA:  Cough and congestion.  Chest pain. EXAM: CHEST - 2 VIEW COMPARISON:  02/25/2019 FINDINGS: The cardiomediastinal contours are normal. The lungs are clear. Pulmonary vasculature is normal. No consolidation, pleural effusion, or pneumothorax. No acute osseous abnormalities are seen. IMPRESSION: No acute chest findings. Electronically Signed   By: Keith Rake M.D.   On: 03/29/2020 22:18   CT Angio Chest PE W and/or Wo Contrast  Result Date: 03/30/2020 CLINICAL DATA:  Chest pain, shortness of breath, congestion, cough EXAM: CT ANGIOGRAPHY CHEST WITH CONTRAST TECHNIQUE: Multidetector CT imaging of the chest was performed using the standard protocol during bolus administration of intravenous contrast. Multiplanar CT image reconstructions and MIPs were obtained to evaluate the vascular anatomy. CONTRAST:  39mL OMNIPAQUE IOHEXOL 350 MG/ML SOLN COMPARISON:  None. FINDINGS: Cardiovascular: No filling defects in the pulmonary arteries to suggest pulmonary emboli. Heart is normal size. Aorta is normal caliber. Mediastinum/Nodes: No mediastinal, hilar, or axillary adenopathy. Trachea and esophagus are unremarkable. Thyroid unremarkable. Small hiatal hernia. Lungs/Pleura: Lungs are clear. No focal airspace opacities or suspicious nodules. No effusions. Upper Abdomen: Imaging  into the upper abdomen demonstrates no acute findings. Musculoskeletal: Chest wall soft tissues are unremarkable. No acute bony abnormality. Review of the MIP images confirms the above findings. IMPRESSION: No evidence of pulmonary embolus. No acute cardiopulmonary disease. Electronically Signed   By: Rolm Baptise M.D.   On: 03/30/2020 00:55    Procedures Procedures (including critical care time)  Medications Ordered in ED Medications  iohexol (OMNIPAQUE) 350 MG/ML injection 75 mL (75 mLs Intravenous Contrast Given 03/30/20 0024)    ED Course  I have reviewed the triage vital signs and the nursing notes.  Pertinent labs & imaging results that were available during my care of the patient were reviewed by me and considered in my medical decision making (see chart for details).    Ruled out for PE or pulmonary hemorrhage with CTA.  Will refer patient to pulmonary for further care.  As there are no signs of infection, antibiotics are not indicated.    Robin Fox was evaluated in Emergency Department on 03/30/2020 for the symptoms described in the history of present illness. She was evaluated in the context of the global COVID-19 pandemic, which necessitated consideration that the patient might be at risk for infection with  the SARS-CoV-2 virus that causes COVID-19. Institutional protocols and algorithms that pertain to the evaluation of patients at risk for COVID-19 are in a state of rapid change based on information released by regulatory bodies including the CDC and federal and state organizations. These policies and algorithms were followed during the patient's care in the ED.   Final Clinical Impression(s) / ED Diagnoses Return for intractable cough, coughing up blood,fevers >100.4 unrelieved by medication, shortness of breath, intractable vomiting, chest pain, shortness of breath, weakness,numbness, changes in speech, facial asymmetry,abdominal pain, passing out,Inability to tolerate  liquids or food, cough, altered mental status or any concerns. No signs of systemic illness or infection. The patient is nontoxic-appearing on exam and vital signs are within normal limits.   I have reviewed the triage vital signs and the nursing notes. Pertinent labs &imaging results that were available during my care of the patient were reviewed by me and considered in my medical decision making (see chart for details).After history, exam, and medical workup I feel the patient has beenappropriately medically screened and is safe for discharge home. Pertinent diagnoses were discussed with the patient. Patient was given return precautions.    Lam Mccubbins, MD 03/30/20 2257

## 2020-05-05 ENCOUNTER — Ambulatory Visit (INDEPENDENT_AMBULATORY_CARE_PROVIDER_SITE_OTHER): Payer: 59

## 2020-05-05 ENCOUNTER — Encounter: Payer: Self-pay | Admitting: Emergency Medicine

## 2020-05-05 ENCOUNTER — Ambulatory Visit
Admission: EM | Admit: 2020-05-05 | Discharge: 2020-05-05 | Disposition: A | Discharge status: Home / Self Care | Payer: 59 | Attending: Emergency Medicine | Admitting: Emergency Medicine

## 2020-05-05 ENCOUNTER — Other Ambulatory Visit: Payer: Self-pay

## 2020-05-05 DIAGNOSIS — R0602 Shortness of breath: Secondary | ICD-10-CM | POA: Diagnosis not present

## 2020-05-05 DIAGNOSIS — J189 Pneumonia, unspecified organism: Secondary | ICD-10-CM

## 2020-05-05 DIAGNOSIS — R059 Cough, unspecified: Secondary | ICD-10-CM

## 2020-05-05 DIAGNOSIS — Z1152 Encounter for screening for COVID-19: Secondary | ICD-10-CM | POA: Diagnosis not present

## 2020-05-05 MED ORDER — PREDNISONE 50 MG PO TABS
50.0000 mg | ORAL_TABLET | Freq: Every day | ORAL | 0 refills | Status: DC
Start: 2020-05-05 — End: 2020-06-22

## 2020-05-05 MED ORDER — BENZONATATE 200 MG PO CAPS: 200.0000 mg | ORAL_CAPSULE | Freq: Three times a day (TID) | ORAL | 0 refills | Status: AC | PRN

## 2020-05-05 MED ORDER — LEVOFLOXACIN 750 MG PO TABS
750.0000 mg | ORAL_TABLET | Freq: Every day | ORAL | 0 refills | Status: AC
Start: 2020-05-05 — End: 2020-05-10

## 2020-05-05 MED ORDER — ALBUTEROL SULFATE HFA 108 (90 BASE) MCG/ACT IN AERS: 1.0000 | INHALATION_SPRAY | Freq: Four times a day (QID) | RESPIRATORY_TRACT | 0 refills | Status: DC | PRN

## 2020-05-05 NOTE — Discharge Instructions (Signed)
Right lower lung does show area concerning for small pneumonia Begin Levaquin daily for the next 5 days Please avoid strenuous exercise while on this medicine Prednisone daily for 5 days Inhaler as needed for shortness of breath and wheezing Tessalon for cough Rest and fluids Follow-up if not improving or worsening

## 2020-05-05 NOTE — ED Provider Notes (Signed)
[name]    CSN: [id number] Arrival date & time: 05/05/20  1014      History   Chief Complaint Chief Complaint  Patient presents with  . Cough    HPI Lakaisha Danish is a 44 y.o. female history of hypertension, tobacco use, presenting today for evaluation of cough shortness of breath and wheezing. Symptoms have been going on for approximately 3 weeks. No longer reports hemoptysis.  Reports a lot of shortness of breath and wheezing.  Reports approximately half pack tobacco use prior to onset of symptoms.  Minimal URI symptoms, denies sore throat.  Denies fevers.  Appetite normal and oral intake normal.  HPI  Past Medical History:  Diagnosis Date  . Anemia    hx   . Gestational diabetes    Neg 2 hr test after preg  . Headache(784.0)   . Herpes genitalia    2013 - 1st outbreak  . Hypertension    2008  . Pre-eclampsia in third trimester   . Seasonal allergies   . Shortness of breath    w exertion  . Trichomonas   . Twins     Patient Active Problem List   Diagnosis Date Noted  . Screening breast examination 03/30/2019  . Breast pain 03/30/2019  . Tobacco use 08/01/2016  . Routine postpartum follow-up 05/22/2012  . Mild or unspecified pre-eclampsia, with delivery 04/07/2012  . Abnormal maternal glucose tolerance, antepartum 03/27/2012  . History of IUGR (intrauterine growth retardation) and stillbirth, currently pregnant 02/12/2012  . History of stillbirth 12/12/2011  . Previous cesarean delivery, antepartum condition or complication 40/98/1191  . Obesity (BMI 30-39.9) 11/21/2011  . Hx of herpes genitalis 11/21/2011  . Hypertension in pregnancy, essential, antepartum 11/09/2011  . Essential hypertension 11/09/2011  . AMA (advanced maternal age) multigravida 35+ 11/09/2011    Past Surgical History:  Procedure Laterality Date  . CESAREAN SECTION     X 2  . CESAREAN SECTION  04/21/2012   Procedure: CESAREAN SECTION;  Surgeon: Woodroe Mode, MD;  Location: [facility];  Service: Obstetrics;  Laterality: N/A;  . TOOTH EXTRACTION      OB History    Gravida  4   Para  3   Term  1   Preterm  2   AB  0   Living  4     SAB  0   IAB  0   Ectopic  0   Multiple  2   Live Births  4            Home Medications    Prior to Admission medications   Medication Sig Start Date End Date Taking? Authorizing Provider  albuterol (VENTOLIN HFA) 108 (90 Base) MCG/ACT inhaler Inhale 1-2 puffs into the lungs every 6 (six) hours as needed for wheezing or shortness of breath. 05/05/20   Bronte Sabado C, PA-C  benzonatate (TESSALON) 200 MG capsule Take 1 capsule (200 mg total) by mouth 3 (three) times daily as needed for up to 7 days for cough. 05/05/20 05/12/20  Cindia Hustead C, PA-C  cyclobenzaprine (FLEXERIL) 10 MG tablet Take 1 tablet (10 mg total) by mouth 2 (two) times daily as needed for muscle spasms. 12/18/19   [name], NP  ferrous sulfate 325 (65 FE) MG tablet Take 325 mg by mouth daily with breakfast. Patient not taking: Reported on 11/15/2019    [provider]  hydrochlorothiazide (HYDRODIURIL) 25 MG tablet Take 1 tablet (25 mg total) by mouth daily.  09/27/19   [name], MD  hydrOXYzine (ATARAX/VISTARIL) 25 MG tablet Take 0.5-1 tablets (12.5-25 mg total) by mouth every 8 (eight) hours as needed for itching. 12/21/19   Jaynee Eagles, PA-C  ibuprofen (ADVIL) 800 MG tablet Take 1 tablet (800 mg total) by mouth every 8 (eight) hours as needed for moderate pain. 12/18/19   [name], NP  levofloxacin (LEVAQUIN) 750 MG tablet Take 1 tablet (750 mg total) by mouth daily for 5 days. 05/05/20 05/10/20  Tylee Newby C, PA-C  Multiple Vitamin (MULTIVITAMIN) tablet Take 1 tablet by mouth daily.    [provider]  predniSONE (DELTASONE) 50 MG tablet Take 1 tablet (50 mg total) by mouth daily. 05/05/20   Angelette Ganus C, PA-C  valACYclovir (VALTREX) 500 MG tablet TAKE 1 TABLET BY  MOUTH ONCE DAILY INCREASE  TO  TWICE  DAILY  FOR  3  DAYS  DURING  OUTBREAK 02/21/20   [name], MD  vitamin C (ASCORBIC ACID) 500 MG tablet Take 500 mg by mouth daily. Patient not taking: Reported on 11/15/2019    [provider]    Family History Family History  Problem Relation Age of Onset  . Hypertension Mother   . Stroke Mother   . Stroke Maternal Grandmother   . Heart disease Maternal Grandfather 60  . Hypertension Brother   . Diabetes Brother   . Stomach cancer Brother   . Hypertension Brother   . Diabetes Brother     Social History Social History   Tobacco Use  . Smoking status: Former Smoker    Packs/day: 0.25    Years: 17.00    Pack years: 4.25    Types: Cigarettes    Quit date: 09/11/2019    Years since quitting: 0.6  . Smokeless tobacco: Never Used  Vaping Use  . Vaping Use: Never used  Substance Use Topics  . Alcohol use: Yes    Comment: socially  . Drug use: No     Allergies   Penicillins   Review of Systems Review of Systems  Constitutional: Positive for fatigue. Negative for activity change, appetite change, chills and fever.  HENT: Positive for congestion and rhinorrhea. Negative for ear pain, sinus pressure, sore throat and trouble swallowing.   Eyes: Negative for discharge and redness.  Respiratory: Positive for cough, chest tightness and shortness of breath.   Cardiovascular: Negative for chest pain.  Gastrointestinal: Negative for abdominal pain, diarrhea, nausea and vomiting.  Musculoskeletal: Negative for myalgias.  Skin: Negative for rash.  Neurological: Positive for dizziness. Negative for light-headedness and headaches.     Physical Exam Triage Vital Signs ED Triage Vitals  Enc Vitals Group     BP      Pulse      Resp      Temp      Temp src      SpO2      Weight      Height      Head Circumference      Peak Flow      Pain Score      Pain Loc      Pain Edu?      Excl. in [place]?    No data  found.  Updated Vital Signs BP 138/85 (BP Location: Right Arm)   Pulse 76   Temp 98.1 F (36.7 C) (Oral)   Resp 16   LMP 04/08/2020 (Exact Date)   SpO2 98%   Visual Acuity Right Eye Distance:  Left Eye Distance:   Bilateral Distance:    Right Eye Near:   Left Eye Near:    Bilateral Near:     Physical Exam Vitals and nursing note reviewed.  Constitutional:      Appearance: She is well-developed and well-nourished.     Comments: No acute distress  HENT:     Head: Normocephalic and atraumatic.     Ears:     Comments: Bilateral ears without tenderness to palpation of external auricle, tragus and mastoid, EAC's without erythema or swelling, TM's with good bony landmarks and cone of light. Non erythematous.     Nose: Nose normal.     Mouth/Throat:     Comments: Oral mucosa pink and moist, no tonsillar enlargement or exudate. Posterior pharynx patent and nonerythematous, no uvula deviation or swelling. Normal phonation. Eyes:     Conjunctiva/sclera: Conjunctivae normal.  Cardiovascular:     Rate and Rhythm: Normal rate.  Pulmonary:     Effort: Pulmonary effort is normal. No respiratory distress.     Comments: Breathing comfortably at rest, CTABL, no wheezing, rales or other adventitious sounds auscultated Abdominal:     General: There is no distension.  Musculoskeletal:        General: Normal range of motion.     Cervical back: Neck supple.  Skin:    General: Skin is warm and dry.  Neurological:     Mental Status: She is alert and oriented to person, place, and time.  Psychiatric:        Mood and Affect: Mood and affect normal.      UC Treatments / Results  Labs (all labs ordered are listed, but only abnormal results are displayed) Labs Reviewed  NOVEL CORONAVIRUS, NAA    EKG   Radiology DG Chest 2 View  Result Date: 05/05/2020 CLINICAL DATA:  cough x 1 month, shortness of breath EXAM: CHEST - 2 VIEW COMPARISON:  03/29/2020 and prior FINDINGS: Minimal  right basilar opacities. No pneumothorax or pleural effusion. Cardiomediastinal silhouette is within normal limits. No acute osseous abnormality. IMPRESSION: Linear right basilar opacities, atelectasis versus infiltrate. Electronically Signed   By: Primitivo Gauze M.D.   On: 05/05/2020 12:07    Procedures Procedures (including critical care time)  Medications Ordered in UC Medications - No data to display  Initial Impression / Assessment and Plan / UC Course  I have reviewed the triage vital signs and the nursing notes.  Pertinent labs & imaging results that were available during my care of the patient were reviewed by me and considered in my medical decision making (see chart for details).     Right lower basilar opacity, has anaphylaxis with penicillins will proceed with monotherapy with Levaquin as alternative.  Discussed black box warning of possible tendon rupture and advised against strenuous exercise while on this medicine.  Given reported wheezing and shortness of breath as well as in setting of tobacco use, also covering bronchitis and initiated on prednisone and albuterol inhaler.  Tessalon for cough.  Rest and fluids.  Discussed strict return precautions. Patient verbalized understanding and is agreeable with plan.  Final Clinical Impressions(s) / UC Diagnoses   Final diagnoses:  Encounter for screening for COVID-19  Community acquired pneumonia of right lower lobe of lung     Discharge Instructions     Right lower lung does show area concerning for small pneumonia Begin Levaquin daily for the next 5 days Please avoid strenuous exercise while on this medicine Prednisone daily for 5  days Inhaler as needed for shortness of breath and wheezing Tessalon for cough Rest and fluids Follow-up if not improving or worsening    ED Prescriptions    Medication Sig Dispense Auth. Provider   levofloxacin (LEVAQUIN) 750 MG tablet Take 1 tablet (750 mg total) by mouth daily  for 5 days. 5 tablet Elo Marmolejos C, PA-C   benzonatate (TESSALON) 200 MG capsule Take 1 capsule (200 mg total) by mouth 3 (three) times daily as needed for up to 7 days for cough. 28 capsule Nakia Remmers C, PA-C   albuterol (VENTOLIN HFA) 108 (90 Base) MCG/ACT inhaler Inhale 1-2 puffs into the lungs every 6 (six) hours as needed for wheezing or shortness of breath. 18 g Roldan Laforest C, PA-C   predniSONE (DELTASONE) 50 MG tablet Take 1 tablet (50 mg total) by mouth daily. 5 tablet Alexiz Sustaita, [place] C, PA-C     PDMP not reviewed this encounter.   Janith Lima, [place] 05/05/20 1218

## 2020-05-05 NOTE — ED Triage Notes (Signed)
Pt said she has been having cough, sob x 3 weeks. Pt said she has not been coughing up blood like she wsa, no fevers, no chills. Pt said she was not given anything when she went to the ED

## 2020-05-06 LAB — SARS-COV-2, NAA 2 DAY TAT

## 2020-05-06 LAB — NOVEL CORONAVIRUS, NAA: SARS-CoV-2, NAA: NOT DETECTED

## 2020-05-15 ENCOUNTER — Ambulatory Visit: Payer: 59 | Admitting: Registered Nurse

## 2020-05-15 ENCOUNTER — Encounter: Payer: Self-pay | Admitting: Registered Nurse

## 2020-05-15 ENCOUNTER — Other Ambulatory Visit: Payer: Self-pay

## 2020-05-15 ENCOUNTER — Ambulatory Visit (INDEPENDENT_AMBULATORY_CARE_PROVIDER_SITE_OTHER): Payer: 59

## 2020-05-15 VITALS — BP 124/82 | HR 80 | Temp 98.0°F | Resp 18 | Ht 67.0 in | Wt 240.2 lb

## 2020-05-15 DIAGNOSIS — J189 Pneumonia, unspecified organism: Secondary | ICD-10-CM

## 2020-05-15 DIAGNOSIS — R062 Wheezing: Secondary | ICD-10-CM | POA: Diagnosis not present

## 2020-05-15 DIAGNOSIS — R0781 Pleurodynia: Secondary | ICD-10-CM

## 2020-05-15 DIAGNOSIS — R0602 Shortness of breath: Secondary | ICD-10-CM

## 2020-05-15 MED ORDER — ALBUTEROL SULFATE (2.5 MG/3ML) 0.083% IN NEBU: 2.5000 mg | INHALATION_SOLUTION | Freq: Four times a day (QID) | RESPIRATORY_TRACT | 1 refills | Status: DC | PRN

## 2020-05-15 MED ORDER — MONTELUKAST SODIUM 10 MG PO TABS: 10.0000 mg | ORAL_TABLET | Freq: Every day | ORAL | 3 refills | Status: DC

## 2020-05-15 MED ORDER — METHOCARBAMOL 500 MG PO TABS: 500.0000 mg | ORAL_TABLET | Freq: Four times a day (QID) | ORAL | 0 refills | Status: DC

## 2020-05-15 NOTE — Progress Notes (Signed)
Acute Office Visit  Subjective:    Patient ID: Robin Fox, female    DOB: 04/02/1976, 44 y.o.   MRN: [id number]  Chief Complaint  Patient presents with  . Pneumonia    Patient states she is here because she was dx with Pneumonia and even with the steroids and antibiotic for five days it still hurts.    HPI Patient is in today for R lower chest pain.   Ongoing for some time - cough onset around 1 mo ago Seen at ED on 05/05/20 - cxr showed infiltrate Given levaquin x 5 days and prednisone burst. Improved but not resolved, now worsening again Breathing is much better but pain is not No cough, mucus, shob, doe, or other respiratory symptoms No sick contacts Has not rec'd covid vaccine  Past Medical History:  Diagnosis Date  . Anemia    hx   . Gestational diabetes    Neg 2 hr test after preg  . Headache(784.0)   . Herpes genitalia    2013 - 1st outbreak  . Hypertension    2008  . Pre-eclampsia in third trimester   . Seasonal allergies   . Shortness of breath    w exertion  . Trichomonas   . Twins     Past Surgical History:  Procedure Laterality Date  . CESAREAN SECTION     X 2  . CESAREAN SECTION  04/21/2012   Procedure: CESAREAN SECTION;  Surgeon: Woodroe Mode, MD;  Location: [facility];  Service: Obstetrics;  Laterality: N/A;  . TOOTH EXTRACTION      Family History  Problem Relation Age of Onset  . Hypertension Mother   . Stroke Mother   . Stroke Maternal Grandmother   . Heart disease Maternal Grandfather 60  . Hypertension Brother   . Diabetes Brother   . Stomach cancer Brother   . Hypertension Brother   . Diabetes Brother     Social History   Socioeconomic History  . Marital status: Married    Spouse name: Not on file  . Number of children: Not on file  . Years of education: Not on file  . Highest education level: Not on file  Occupational History  . Not on file  Tobacco Use  . Smoking status: Former Smoker    Packs/day: 0.25     Years: 17.00    Pack years: 4.25    Types: Cigarettes    Quit date: 09/11/2019    Years since quitting: 0.6  . Smokeless tobacco: Never Used  Vaping Use  . Vaping Use: Never used  Substance and Sexual Activity  . Alcohol use: Yes    Comment: socially  . Drug use: No  . Sexual activity: Yes    Birth control/protection: Other-see comments, None    Comment: Partner had vasectomy-1st intercourse 54 yo-More than 5 partners  Other Topics Concern  . Not on file  Social History Narrative  . Not on file   Social Determinants of Health   Financial Resource Strain: Not on file  Food Insecurity: No Food Insecurity  . Worried About Charity fundraiser in the Last Year: Never true  . Ran Out of Food in the Last Year: Never true  Transportation Needs: No Transportation Needs  . Lack of Transportation (Medical): No  . Lack of Transportation (Non-Medical): No  Physical Activity: Not on file  Stress: Not on file  Social Connections: Not on file  Intimate Partner Violence: Not on file  Outpatient Medications Prior to Visit  Medication Sig Dispense Refill  . albuterol (VENTOLIN HFA) 108 (90 Base) MCG/ACT inhaler Inhale 1-2 puffs into the lungs every 6 (six) hours as needed for wheezing or shortness of breath. 18 g 0  . cyclobenzaprine (FLEXERIL) 10 MG tablet Take 1 tablet (10 mg total) by mouth 2 (two) times daily as needed for muscle spasms. 20 tablet 0  . ferrous sulfate 325 (65 FE) MG tablet Take 325 mg by mouth daily with breakfast.    . hydrochlorothiazide (HYDRODIURIL) 25 MG tablet Take 1 tablet (25 mg total) by mouth daily. 90 tablet 1  . hydrOXYzine (ATARAX/VISTARIL) 25 MG tablet Take 0.5-1 tablets (12.5-25 mg total) by mouth every 8 (eight) hours as needed for itching. 30 tablet 0  . ibuprofen (ADVIL) 800 MG tablet Take 1 tablet (800 mg total) by mouth every 8 (eight) hours as needed for moderate pain. 21 tablet 0  . Multiple Vitamin (MULTIVITAMIN) tablet Take 1 tablet by mouth  daily.    . predniSONE (DELTASONE) 50 MG tablet Take 1 tablet (50 mg total) by mouth daily. 5 tablet 0  . valACYclovir (VALTREX) 500 MG tablet TAKE 1 TABLET BY MOUTH ONCE DAILY INCREASE  TO  TWICE  DAILY  FOR  3  DAYS  DURING  OUTBREAK 90 tablet 0  . vitamin C (ASCORBIC ACID) 500 MG tablet Take 500 mg by mouth daily.     No facility-administered medications prior to visit.    Allergies  Allergen Reactions  . Penicillins Hives, Shortness Of Breath and Swelling    Has patient had a PCN reaction causing immediate rash, facial/tongue/throat swelling, SOB or lightheadedness with hypotension: no Has patient had a PCN reaction causing severe rash involving mucus membranes or skin necrosis:no Has patient had a PCN reaction that required hospitalization no Has patient had a PCN reaction occurring within the last 10 years: {Yes If all of the above answers are "NO", then may proceed with Cephalosporin use.    Review of Systems  Constitutional: Negative.   HENT: Negative.   Eyes: Negative.   Respiratory: Positive for chest tightness. Negative for apnea, cough, choking, shortness of breath, wheezing and stridor.   Cardiovascular: Positive for chest pain (pleuritic, lower right side). Negative for palpitations and leg swelling.  Gastrointestinal: Negative.   Endocrine: Negative.   Genitourinary: Negative.   Musculoskeletal: Negative.   Skin: Negative.   Allergic/Immunologic: Negative.   Neurological: Negative.   Hematological: Negative.   Psychiatric/Behavioral: Negative.        Objective:    Physical Exam Vitals reviewed.  Constitutional:      Appearance: Normal appearance.  Cardiovascular:     Rate and Rhythm: Normal rate and regular rhythm.     Heart sounds: Normal heart sounds.  Pulmonary:     Effort: Pulmonary effort is normal. No respiratory distress.     Breath sounds: No stridor. Wheezing present. No rhonchi or rales.  Chest:     Chest wall: Tenderness (lower right, ribs)  present.  Skin:    General: Skin is warm and dry.  Neurological:     General: No focal deficit present.     Mental Status: She is alert and oriented to person, place, and time. Mental status is at baseline.  Psychiatric:        Mood and Affect: Mood normal.        Behavior: Behavior normal.        Thought Content: Thought content normal.  Judgment: Judgment normal.     BP 124/82   Pulse 80   Temp 98 F (36.7 C) (Temporal)   Resp 18   Ht 5\' 7"  (1.702 m)   Wt 240 lb 3.2 oz (109 kg)   SpO2 99%   BMI 37.62 kg/m  Wt Readings from Last 3 Encounters:  05/15/20 240 lb 3.2 oz (109 kg)  03/29/20 253 lb 8.5 oz (115 kg)  11/15/19 223 lb 8 oz (101.4 kg)    There are no preventive care reminders to display for this patient.  There are no preventive care reminders to display for this patient.   Lab Results  Component Value Date   TSH 1.47 09/16/2017   Lab Results  Component Value Date   WBC 9.0 03/29/2020   HGB 13.2 03/29/2020   HCT 43.4 03/29/2020   MCV 71.1 (L) 03/29/2020   PLT 345 03/29/2020   Lab Results  Component Value Date   NA 137 03/29/2020   K 3.4 (L) 03/29/2020   CO2 27 03/29/2020   GLUCOSE 127 (H) 03/29/2020   BUN 12 03/29/2020   CREATININE 0.94 03/29/2020   BILITOT 0.2 10/11/2019   ALKPHOS 57 10/11/2019   AST 23 10/11/2019   ALT 10 10/11/2019   PROT 7.0 10/11/2019   ALBUMIN 4.3 10/11/2019   CALCIUM 9.7 03/29/2020   ANIONGAP 11 03/29/2020   Lab Results  Component Value Date   CHOL 180 10/11/2019   Lab Results  Component Value Date   HDL 52 10/11/2019   Lab Results  Component Value Date   LDLCALC 112 (H) 10/11/2019   Lab Results  Component Value Date   TRIG 88 10/11/2019   Lab Results  Component Value Date   CHOLHDL 3.5 10/11/2019   Lab Results  Component Value Date   HGBA1C 5.6 10/11/2019       Assessment & Plan:   Problem List Items Addressed This Visit   None   Visit Diagnoses    Pneumonia of right lower lobe due to  infectious organism    -  Primary   Relevant Medications   montelukast (SINGULAIR) 10 MG tablet   albuterol (PROVENTIL) (2.5 MG/3ML) 0.083% nebulizer solution   Other Relevant Orders   DG Chest 2 View (Completed)   Rib pain       Relevant Medications   methocarbamol (ROBAXIN) 500 MG tablet   Shortness of breath       Relevant Medications   montelukast (SINGULAIR) 10 MG tablet   albuterol (PROVENTIL) (2.5 MG/3ML) 0.083% nebulizer solution   Wheezing       Relevant Medications   montelukast (SINGULAIR) 10 MG tablet   albuterol (PROVENTIL) (2.5 MG/3ML) 0.083% nebulizer solution       No orders of the defined types were placed in this encounter.  PLAN  Some ongoing wheezing likely allergies/asthma.  Dg chest shows no infiltrates or abnormalities  Will sent robaxin for intercostal muscle strain  Albuterol neb solution and montelukast for breathing issues  Return if worsening or failing to improve  Discussed that if neb use is too chronic may want to try qvar/symbicort/flovent or similar  Patient encouraged to call clinic with any questions, comments, or concerns.   [name], NP

## 2020-05-15 NOTE — Patient Instructions (Signed)
° ° ° °  If you have lab work done today you will be contacted with your lab results within the next 2 weeks.  If you have not heard from us then please contact us. The fastest way to get your results is to register for My Chart. ° ° °IF you received an x-ray today, you will receive an invoice from [facility]. Please contact [facility] at [phone] with questions or concerns regarding your invoice.  ° °IF you received labwork today, you will receive an invoice from [facility]. Please contact [facility] at [phone] with questions or concerns regarding your invoice.  ° °Our billing staff will not be able to assist you with questions regarding bills from these companies. ° °You will be contacted with the lab results as soon as they are available. The fastest way to get your results is to activate your My Chart account. Instructions are located on the last page of this paperwork. If you have not heard from us regarding the results in 2 weeks, please contact this office. °  ° ° ° °

## 2020-06-04 ENCOUNTER — Ambulatory Visit
Admission: EM | Admit: 2020-06-04 | Discharge: 2020-06-04 | Disposition: A | Discharge status: Home / Self Care | Payer: 59 | Attending: Emergency Medicine | Admitting: Emergency Medicine

## 2020-06-04 ENCOUNTER — Encounter: Payer: Self-pay | Admitting: Emergency Medicine

## 2020-06-04 ENCOUNTER — Other Ambulatory Visit: Payer: Self-pay

## 2020-06-04 DIAGNOSIS — N898 Other specified noninflammatory disorders of vagina: Secondary | ICD-10-CM | POA: Insufficient documentation

## 2020-06-04 MED ORDER — FLUCONAZOLE 150 MG PO TABS: 150.0000 mg | ORAL_TABLET | Freq: Once | ORAL | 0 refills | Status: AC

## 2020-06-04 NOTE — ED Provider Notes (Signed)
[name]    CSN: [id number] Arrival date & time: 06/04/20  1425      History   Chief Complaint Chief Complaint  Patient presents with  . Vaginal Discharge  Vaginal discharge  HPI Robin Fox is a 45 y.o. female presenting today for evaluation of vaginal discomfort.  Patient recently treated for pneumonia with Levaquin.  Symptoms of discharge and itching started over the past 3 to 4 days.  Recently ended menstrual cycle.  Has had slight odor.  Reports history of yeast and BV.  Using over-the-counter yeast treatments without relief.  Has had some mild lower abdominal cramping associated with this.  HPI  Past Medical History:  Diagnosis Date  . Anemia    hx   . Gestational diabetes    Neg 2 hr test after preg  . Headache(784.0)   . Herpes genitalia    2013 - 1st outbreak  . Hypertension    2008  . Pre-eclampsia in third trimester   . Seasonal allergies   . Shortness of breath    w exertion  . Trichomonas   . Twins     Patient Active Problem List   Diagnosis Date Noted  . Screening breast examination 03/30/2019  . Breast pain 03/30/2019  . Tobacco use 08/01/2016  . Routine postpartum follow-up 05/22/2012  . Mild or unspecified pre-eclampsia, with delivery 04/07/2012  . Abnormal maternal glucose tolerance, antepartum 03/27/2012  . History of IUGR (intrauterine growth retardation) and stillbirth, currently pregnant 02/12/2012  . History of stillbirth 12/12/2011  . Previous cesarean delivery, antepartum condition or complication 29/92/4268  . Obesity (BMI 30-39.9) 11/21/2011  . Hx of herpes genitalis 11/21/2011  . Hypertension in pregnancy, essential, antepartum 11/09/2011  . Essential hypertension 11/09/2011  . AMA (advanced maternal age) multigravida 35+ 11/09/2011    Past Surgical History:  Procedure Laterality Date  . CESAREAN SECTION     X 2  . CESAREAN SECTION  04/21/2012   Procedure: CESAREAN SECTION;  Surgeon: Woodroe Mode,  MD;  Location: [facility];  Service: Obstetrics;  Laterality: N/A;  . TOOTH EXTRACTION      OB History    Gravida  4   Para  3   Term  1   Preterm  2   AB  0   Living  4     SAB  0   IAB  0   Ectopic  0   Multiple  2   Live Births  4            Home Medications    Prior to Admission medications   Medication Sig Start Date End Date Taking? Authorizing Provider  fluconazole (DIFLUCAN) 150 MG tablet Take 1 tablet (150 mg total) by mouth once for 1 dose. 06/04/20 06/04/20 Yes Providence Stivers C, PA-C  albuterol (PROVENTIL) (2.5 MG/3ML) 0.083% nebulizer solution Take 3 mLs (2.5 mg total) by nebulization every 6 (six) hours as needed for wheezing or shortness of breath. 05/15/20   [name], NP  albuterol (VENTOLIN HFA) 108 (90 Base) MCG/ACT inhaler Inhale 1-2 puffs into the lungs every 6 (six) hours as needed for wheezing or shortness of breath. 05/05/20   Shaquavia Whisonant C, PA-C  cyclobenzaprine (FLEXERIL) 10 MG tablet Take 1 tablet (10 mg total) by mouth 2 (two) times daily as needed for muscle spasms. 12/18/19   [name], NP  ferrous sulfate 325 (65 FE) MG tablet Take 325 mg by mouth daily with breakfast.    [provider]  hydrochlorothiazide (HYDRODIURIL) 25 MG tablet Take 1 tablet (25 mg total) by mouth daily. 09/27/19   [name], MD  hydrOXYzine (ATARAX/VISTARIL) 25 MG tablet Take 0.5-1 tablets (12.5-25 mg total) by mouth every 8 (eight) hours as needed for itching. 12/21/19   Jaynee Eagles, PA-C  ibuprofen (ADVIL) 800 MG tablet Take 1 tablet (800 mg total) by mouth every 8 (eight) hours as needed for moderate pain. 12/18/19   [name], NP  methocarbamol (ROBAXIN) 500 MG tablet Take 1 tablet (500 mg total) by mouth 4 (four) times daily. 05/15/20   [name], NP  montelukast (SINGULAIR) 10 MG tablet Take 1 tablet (10 mg total) by mouth at bedtime. 05/15/20   [name], NP  Multiple Vitamin (MULTIVITAMIN) tablet Take 1 tablet  by mouth daily.    [provider]  predniSONE (DELTASONE) 50 MG tablet Take 1 tablet (50 mg total) by mouth daily. 05/05/20   Myrtle Haller C, PA-C  valACYclovir (VALTREX) 500 MG tablet TAKE 1 TABLET BY MOUTH ONCE DAILY INCREASE  TO  TWICE  DAILY  FOR  3  DAYS  DURING  OUTBREAK 02/21/20   [name], MD  vitamin C (ASCORBIC ACID) 500 MG tablet Take 500 mg by mouth daily.    [provider]    Family History Family History  Problem Relation Age of Onset  . Hypertension Mother   . Stroke Mother   . Stroke Maternal Grandmother   . Heart disease Maternal Grandfather 60  . Hypertension Brother   . Diabetes Brother   . Stomach cancer Brother   . Hypertension Brother   . Diabetes Brother     Social History Social History   Tobacco Use  . Smoking status: Former Smoker    Packs/day: 0.25    Years: 17.00    Pack years: 4.25    Types: Cigarettes    Quit date: 09/11/2019    Years since quitting: 0.7  . Smokeless tobacco: Never Used  Vaping Use  . Vaping Use: Never used  Substance Use Topics  . Alcohol use: Yes    Comment: socially  . Drug use: No     Allergies   Penicillins   Review of Systems Review of Systems  Constitutional: Negative for fever.  Respiratory: Negative for shortness of breath.   Cardiovascular: Negative for chest pain.  Gastrointestinal: Negative for abdominal pain, diarrhea, nausea and vomiting.  Genitourinary: Positive for vaginal discharge. Negative for dysuria, flank pain, genital sores, hematuria, menstrual problem, vaginal bleeding and vaginal pain.  Musculoskeletal: Negative for back pain.  Skin: Negative for rash.  Neurological: Negative for dizziness, light-headedness and headaches.     Physical Exam Triage Vital Signs ED Triage Vitals  Enc Vitals Group     BP      Pulse      Resp      Temp      Temp src      SpO2      Weight      Height      Head Circumference      Peak Flow      Pain Score      Pain  Loc      Pain Edu?      Excl. in [place]?    No data found.  Updated Vital Signs BP (!) 156/80 (BP Location: Right Arm)   Pulse 88   Temp 99 F (37.2 C) (Oral)   Resp 18   SpO2  98%   Visual Acuity Right Eye Distance:   Left Eye Distance:   Bilateral Distance:    Right Eye Near:   Left Eye Near:    Bilateral Near:     Physical Exam Vitals and nursing note reviewed.  Constitutional:      Appearance: She is well-developed and well-nourished.     Comments: No acute distress  HENT:     Head: Normocephalic and atraumatic.     Nose: Nose normal.  Eyes:     Conjunctiva/sclera: Conjunctivae normal.  Cardiovascular:     Rate and Rhythm: Normal rate and regular rhythm.  Pulmonary:     Effort: Pulmonary effort is normal. No respiratory distress.     Comments: Mild expiratory wheezing noted in bilateral lung fields Abdominal:     General: There is no distension.     Comments: Soft, nondistended, nontender to light and deep palpation throughout abdomen  Musculoskeletal:        General: Normal range of motion.     Cervical back: Neck supple.  Skin:    General: Skin is warm and dry.  Neurological:     Mental Status: She is alert and oriented to person, place, and time.  Psychiatric:        Mood and Affect: Mood and affect normal.      UC Treatments / Results  Labs (all labs ordered are listed, but only abnormal results are displayed) Labs Reviewed  CERVICOVAGINAL ANCILLARY ONLY    EKG   Radiology No results found.  Procedures Procedures (including critical care time)  Medications Ordered in UC Medications - No data to display  Initial Impression / Assessment and Plan / UC Course  I have reviewed the triage vital signs and the nursing notes.  Pertinent labs & imaging results that were available during my care of the patient were reviewed by me and considered in my medical decision making (see chart for details).     Vaginal swab pending, empirically treating  for yeast today with Diflucan, given recent antibiotic use.  Will call with results and provide further treatment as needed.  Discussed strict return precautions. Patient verbalized understanding and is agreeable with plan.  Final Clinical Impressions(s) / UC Diagnoses   Final diagnoses:  Vaginal discharge     Discharge Instructions     Take 1 tablet of Diflucan today to treat for yeast, 1 swab results returned positive for yeast and still having symptoms may repeat second tablet  We are testing you for Gonorrhea, Chlamydia, Trichomonas, Yeast and Bacterial Vaginosis. We will call you if anything is positive and let you know if you require any further treatment. Please inform partners of any positive results.   Please return if symptoms not improving with treatment, development of fever, nausea, vomiting, abdominal pain.     ED Prescriptions    Medication Sig Dispense Auth. Provider   fluconazole (DIFLUCAN) 150 MG tablet Take 1 tablet (150 mg total) by mouth once for 1 dose. 2 tablet Lachina Salsberry, Ethan C, PA-C     PDMP not reviewed this encounter.   Janith Lima, [place] 06/04/20 1534

## 2020-06-04 NOTE — Discharge Instructions (Addendum)
Take 1 tablet of Diflucan today to treat for yeast, 1 swab results returned positive for yeast and still having symptoms may repeat second tablet  We are testing you for Gonorrhea, Chlamydia, Trichomonas, Yeast and Bacterial Vaginosis. We will call you if anything is positive and let you know if you require any further treatment. Please inform partners of any positive results.   Please return if symptoms not improving with treatment, development of fever, nausea, vomiting, abdominal pain.

## 2020-06-04 NOTE — ED Triage Notes (Signed)
Pt here for vaginal itching and discharge that is not improving with yeast treatment

## 2020-06-06 ENCOUNTER — Telehealth (HOSPITAL_COMMUNITY): Payer: Self-pay

## 2020-06-06 LAB — CERVICOVAGINAL ANCILLARY ONLY
Bacterial Vaginitis (gardnerella): POSITIVE — AB
Candida Glabrata: NEGATIVE
Candida Vaginitis: NEGATIVE
Chlamydia: NEGATIVE
Comment: NEGATIVE
Comment: NEGATIVE
Comment: NEGATIVE
Comment: NEGATIVE
Comment: NEGATIVE
Comment: NORMAL
Neisseria Gonorrhea: NEGATIVE
Trichomonas: NEGATIVE

## 2020-06-06 MED ORDER — METRONIDAZOLE 500 MG PO TABS: 500.0000 mg | ORAL_TABLET | Freq: Two times a day (BID) | ORAL | 0 refills | Status: DC

## 2020-06-20 ENCOUNTER — Telehealth: Payer: Self-pay | Admitting: Family Medicine

## 2020-06-20 NOTE — Telephone Encounter (Signed)
Pt states she was seen at urgent care 06-04-20 for yeast infection and the meds are not working. Pt states she still has itching, burning, and vaginal discharge. Pt request a call ASAP to let her know what to do or if she needs to come into the office. thanks

## 2020-06-21 NOTE — Telephone Encounter (Signed)
needs to rechedule

## 2020-06-21 NOTE — Telephone Encounter (Signed)
Called pt to discuss her concern. She stated that she was prescribed 2 tablets of Fluconazole for presumptive yeast infection during ED visit on 1/9. She took the medication as directed. When her test results were received it showed she had BV and was then prescribed Metronidazole on 1/11. She has completed that medication and is still having vaginal itching, burning and discharge. Pt stated that she has also tried OTC  Monistat with no relief. I advised that we need her to come to office for nurse visit to perform self swab so that we can know what treatment she requires. Pt voiced understanding and agreed to appt tomorrow @ 10:00 am.

## 2020-06-22 ENCOUNTER — Ambulatory Visit (INDEPENDENT_AMBULATORY_CARE_PROVIDER_SITE_OTHER): Payer: 59

## 2020-06-22 ENCOUNTER — Other Ambulatory Visit (HOSPITAL_COMMUNITY)
Admission: RE | Admit: 2020-06-22 | Discharge: 2020-06-22 | Disposition: A | Discharge status: Home / Self Care | Payer: 59 | Source: Ambulatory Visit | Attending: Family Medicine | Admitting: Family Medicine

## 2020-06-22 ENCOUNTER — Other Ambulatory Visit: Payer: Self-pay

## 2020-06-22 VITALS — BP 139/80 | HR 81 | Ht 67.5 in | Wt 245.6 lb

## 2020-06-22 DIAGNOSIS — R300 Dysuria: Secondary | ICD-10-CM

## 2020-06-22 DIAGNOSIS — N898 Other specified noninflammatory disorders of vagina: Secondary | ICD-10-CM | POA: Diagnosis present

## 2020-06-22 LAB — POCT URINALYSIS DIP (DEVICE)
Bilirubin Urine: NEGATIVE
Glucose, UA: NEGATIVE mg/dL
Hgb urine dipstick: NEGATIVE
Ketones, ur: NEGATIVE mg/dL
Leukocytes,Ua: NEGATIVE
Nitrite: NEGATIVE
Protein, ur: NEGATIVE mg/dL
Specific Gravity, Urine: >=1.03 (ref 1.005–1.030)
Urobilinogen, UA: 0.2 mg/dL (ref 0.0–1.0)
pH: 5.5 (ref 5.0–8.0)

## 2020-06-22 NOTE — Progress Notes (Signed)
Patient was assessed and managed by nursing staff during this encounter. I have reviewed the chart and agree with the documentation and plan. I have also made any necessary editorial changes.  Griffin Basil, MD 06/22/2020 11:50 AM

## 2020-06-22 NOTE — Progress Notes (Signed)
Pt here today for self swab. Pt states having vaginal discharge with itching for 1 week. Pt states was seen at urgent care last week and was +BV and treated with Flagyl Rx and given Diflucan Rx for yeast. Pt states finished abx on 06/12/20 and took 1 Diflucan yesterday. Pt states symptoms seem better for past 3 days, but still there. Pt concerned with STDs since last year husband cheated and pt had +BV and trich. Pt advised will do self swab today. Pt states had intercourse x 3 in last month with husband. Pt denies any other partners at this time that she is aware of.   Self swab collected today. Pt advised results will take 24-48 hours and will see results in mychart and will be notified if needs further treatment. Pt agreeable and verbalized understanding.   Pt also c/o urinary urgency and back pain for 3 days. Ran UA in office. Neg. Pt aware.  Pt needing annual exam in June. Pt aware and will schedule with front office.   Colletta Maryland, RN 06/22/20.

## 2020-06-23 ENCOUNTER — Other Ambulatory Visit: Payer: Self-pay | Admitting: Family Medicine

## 2020-06-23 DIAGNOSIS — I10 Essential (primary) hypertension: Secondary | ICD-10-CM

## 2020-06-23 LAB — CERVICOVAGINAL ANCILLARY ONLY
Bacterial Vaginitis (gardnerella): NEGATIVE
Candida Glabrata: NEGATIVE
Candida Vaginitis: NEGATIVE
Chlamydia: NEGATIVE
Comment: NEGATIVE
Comment: NEGATIVE
Comment: NEGATIVE
Comment: NEGATIVE
Comment: NEGATIVE
Comment: NORMAL
Neisseria Gonorrhea: NEGATIVE
Trichomonas: NEGATIVE

## 2020-06-23 NOTE — Telephone Encounter (Signed)
Call to patient- transferred to office for appointment for follow up- #30 day RF given

## 2020-06-26 ENCOUNTER — Ambulatory Visit: Payer: 59 | Admitting: Family Medicine

## 2020-06-26 ENCOUNTER — Other Ambulatory Visit: Payer: Self-pay

## 2020-06-26 ENCOUNTER — Encounter: Payer: Self-pay | Admitting: Family Medicine

## 2020-06-26 VITALS — BP 136/89 | HR 97 | Temp 97.9°F | Ht 67.5 in | Wt 239.0 lb

## 2020-06-26 DIAGNOSIS — I10 Essential (primary) hypertension: Secondary | ICD-10-CM

## 2020-06-26 DIAGNOSIS — R739 Hyperglycemia, unspecified: Secondary | ICD-10-CM | POA: Diagnosis not present

## 2020-06-26 DIAGNOSIS — R062 Wheezing: Secondary | ICD-10-CM

## 2020-06-26 DIAGNOSIS — E781 Pure hyperglyceridemia: Secondary | ICD-10-CM | POA: Diagnosis not present

## 2020-06-26 DIAGNOSIS — A6000 Herpesviral infection of urogenital system, unspecified: Secondary | ICD-10-CM

## 2020-06-26 DIAGNOSIS — R0602 Shortness of breath: Secondary | ICD-10-CM

## 2020-06-26 MED ORDER — MONTELUKAST SODIUM 10 MG PO TABS: 10.0000 mg | ORAL_TABLET | Freq: Every day | ORAL | 3 refills | Status: DC

## 2020-06-26 MED ORDER — FLOVENT HFA 44 MCG/ACT IN AERO: 1.0000 | INHALATION_SPRAY | Freq: Two times a day (BID) | RESPIRATORY_TRACT | 6 refills | Status: DC

## 2020-06-26 MED ORDER — VALACYCLOVIR HCL 500 MG PO TABS: ORAL_TABLET | ORAL | 3 refills | Status: DC

## 2020-06-26 MED ORDER — HYDROCHLOROTHIAZIDE 25 MG PO TABS: 25.0000 mg | ORAL_TABLET | Freq: Every day | ORAL | 2 refills | Status: DC

## 2020-06-26 NOTE — Progress Notes (Signed)
Subjective:  Patient ID: Robin Fox, female    DOB: 17-Feb-1976  Age: 45 y.o. MRN: [id number]  CC:  Chief Complaint  Patient presents with  . Follow-up    On hypertension, Hyperglycemia, and Hypertriglyceridemia. PT reports no issues with BP since last OV. PT reports no physical symptoms of these conditions other than when she had [place] back in December. No other issues to discuss at this time.   HPI [name] presents for   Hypertension: hctz 25mg  qd.  Home readings: high 140/90. Usually 120-130/80.  Seen in December by Denice Paradise for follow up pneumonia - CXR without infiltrates.  Albuterol 2 times per week. singulair daily. Getting better.  BP Readings from Last 3 Encounters:  06/26/20 136/89  06/22/20 139/80  06/04/20 (!) 156/80   Lab Results  Component Value Date   CREATININE 0.94 03/29/2020   Hyperlipidemia: No current meds.  Less exercise when sick.  Cardio/HEP on apps at home. Has treadmill at times - better recently - 3-4 times per week. Had lost to about 225, some regain of weight.   Wt Readings from Last 3 Encounters:  06/26/20 239 lb (108.4 kg)  06/22/20 245 lb 9.6 oz (111.4 kg)  05/15/20 240 lb 3.2 oz (109 kg)    Lab Results  Component Value Date   CHOL 180 10/11/2019   HDL 52 10/11/2019   LDLCALC 112 (H) 10/11/2019   TRIG 88 10/11/2019   CHOLHDL 3.5 10/11/2019   Lab Results  Component Value Date   ALT 10 10/11/2019   AST 23 10/11/2019   ALKPHOS 57 10/11/2019   BILITOT 0.2 10/11/2019   Hyperglycemia: Upward trend past few years. 127 in November. K3.4 at that time as well.  Not fasting today.  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 Last one 2 mos ago. Valtrex only used as needed in past, but has taken more often lately.      History Patient Active Problem List   Diagnosis Date Noted  . Screening breast examination 03/30/2019  . Breast pain 03/30/2019  . Tobacco use 08/01/2016  . Routine postpartum follow-up 05/22/2012  . Mild or unspecified pre-eclampsia, with delivery 04/07/2012  . Abnormal maternal glucose tolerance, antepartum 03/27/2012  . History of IUGR (intrauterine growth retardation) and stillbirth, currently pregnant 02/12/2012  . History of stillbirth 12/12/2011  . Previous cesarean delivery, antepartum condition or complication 25/85/2778  . Obesity (BMI 30-39.9) 11/21/2011  . Hx of herpes genitalis 11/21/2011  . Hypertension in pregnancy, essential, antepartum 11/09/2011  . Essential hypertension 11/09/2011  . AMA (advanced maternal age) multigravida 35+ 11/09/2011   Past Medical History:  Diagnosis Date  . Anemia    hx   . Gestational diabetes    Neg 2 hr test after preg  . Headache(784.0)   . Herpes genitalia    2013 - 1st outbreak  . Hypertension    2008  . Pre-eclampsia in third trimester   . Seasonal allergies   . Shortness of breath    w exertion  . Trichomonas   . Twins    Past Surgical History:  Procedure Laterality Date  . CESAREAN SECTION     X 2  . CESAREAN SECTION  04/21/2012   Procedure: CESAREAN SECTION;  Surgeon:  Woodroe Mode, MD;  Location: [facility];  Service: Obstetrics;  Laterality: N/A;  . TOOTH EXTRACTION     Allergies  Allergen Reactions  . Penicillins Hives, Shortness Of Breath and Swelling    Has patient had a PCN reaction causing immediate rash, facial/tongue/throat swelling, SOB or lightheadedness with hypotension: no Has patient had a PCN reaction causing severe rash involving mucus membranes or skin necrosis:no Has patient had a PCN reaction that required hospitalization no Has patient had a PCN reaction occurring within the last 10 years: {Yes If all of the above answers are "NO", then may proceed with Cephalosporin use.   Prior to  Admission medications   Medication Sig Start Date End Date Taking? Authorizing Provider  albuterol (PROVENTIL) (2.5 MG/3ML) 0.083% nebulizer solution Take 3 mLs (2.5 mg total) by nebulization every 6 (six) hours as needed for wheezing or shortness of breath. 05/15/20  Yes [name], NP  albuterol (VENTOLIN HFA) 108 (90 Base) MCG/ACT inhaler Inhale 1-2 puffs into the lungs every 6 (six) hours as needed for wheezing or shortness of breath. 05/05/20  Yes Wieters, Hallie C, PA-C  ferrous sulfate 325 (65 FE) MG tablet Take 325 mg by mouth daily with breakfast.   Yes [provider]  hydrochlorothiazide (HYDRODIURIL) 25 MG tablet Take 1 tablet by mouth once daily 06/23/20  Yes [name], MD  hydrOXYzine (ATARAX/VISTARIL) 25 MG tablet Take 0.5-1 tablets (12.5-25 mg total) by mouth every 8 (eight) hours as needed for itching. 12/21/19  Yes Jaynee Eagles, PA-C  ibuprofen (ADVIL) 800 MG tablet Take 1 tablet (800 mg total) by mouth every 8 (eight) hours as needed for moderate pain. 12/18/19  Yes [name], NP  montelukast (SINGULAIR) 10 MG tablet Take 1 tablet (10 mg total) by mouth at bedtime. 05/15/20  Yes [name], NP  Multiple Vitamin (MULTIVITAMIN) tablet Take 1 tablet by mouth daily.   Yes [provider]  valACYclovir (VALTREX) 500 MG tablet TAKE 1 TABLET BY MOUTH ONCE DAILY INCREASE  TO  TWICE  DAILY  FOR  3  DAYS  DURING  OUTBREAK 02/21/20  Yes [name], MD   Social History   Socioeconomic History  . Marital status: Married    Spouse name: Not on file  . Number of children: Not on file  . Years of education: Not on file  . Highest education level: Not on file  Occupational History  . Not on file  Tobacco Use  . Smoking status: Former Smoker    Packs/day: 0.25    Years: 17.00    Pack years: 4.25    Types: Cigarettes    Quit date: 09/11/2019    Years since quitting: 0.7  . Smokeless tobacco: Never Used  Vaping Use  . Vaping Use: Never  used  Substance and Sexual Activity  . Alcohol use: Yes    Comment: socially  . Drug use: No  . Sexual activity: Yes    Birth control/protection: Other-see comments, None    Comment: Partner had vasectomy-1st intercourse 89 yo-More than 5 partners  Other Topics Concern  . Not on file  Social History Narrative  . Not on file   Social Determinants of Health   Financial Resource Strain: Not on file  Food Insecurity: No Food Insecurity  . Worried About Charity fundraiser in the Last Year: Never true  . Ran Out of Food in the Last Year: Never true  Transportation Needs: No Transportation Needs  . Lack of  Transportation (Medical): No  . Lack of Transportation (Non-Medical): No  Physical Activity: Not on file  Stress: Not on file  Social Connections: Not on file  Intimate Partner Violence: Not on file    Review of Systems  Constitutional: Negative for fatigue and unexpected weight change.  Respiratory: Negative for chest tightness and shortness of breath.   Cardiovascular: Negative for chest pain, palpitations and leg swelling.  Gastrointestinal: Negative for abdominal pain and blood in stool.  Neurological: Negative for dizziness, syncope, light-headedness and headaches.     Objective:   Vitals:   06/26/20 1134  BP: 136/89  Pulse: 97  Temp: 97.9 F (36.6 C)  TempSrc: Temporal  SpO2: 97%  Weight: 239 lb (108.4 kg)  Height: 5' 7.5" (1.715 m)     Physical Exam Vitals reviewed.  Constitutional:      Appearance: She is well-developed and well-nourished.  HENT:     Head: Normocephalic and atraumatic.  Eyes:     Extraocular Movements: EOM normal.     Conjunctiva/sclera: Conjunctivae normal.     Pupils: Pupils are equal, round, and reactive to light.  Neck:     Vascular: No carotid bruit.  Cardiovascular:     Rate and Rhythm: Normal rate and regular rhythm.     Pulses: Intact distal pulses.     Heart sounds: Normal heart sounds.  Pulmonary:     Effort:  Pulmonary effort is normal.     Breath sounds: Normal breath sounds.  Abdominal:     Palpations: Abdomen is soft. There is no pulsatile mass.     Tenderness: There is no abdominal tenderness.  Skin:    General: Skin is warm and dry.  Neurological:     Mental Status: She is alert and oriented to person, place, and time.  Psychiatric:        Mood and Affect: Mood and affect normal.        Behavior: Behavior normal.     Assessment & Plan:  Healy Furtak is a 45 y.o. female . Essential hypertension - Plan: Lipid panel, Comprehensive metabolic panel, hydrochlorothiazide (HYDRODIURIL) 25 MG tablet  -  Stable, tolerating current regimen. Medications refilled. Labs pending as above.   Hyperglycemia - Plan: Hemoglobin A1c  - last A1c ok, increasing readings.   Hypertriglyceridemia - Plan: Lipid panel, Comprehensive metabolic panel  - mild elevated lipids prior. Commended on exercise.  Genital herpes simplex, unspecified site - Plan: valACYclovir (VALTREX) 500 MG tablet  - daily dosing option discussed. Refills ordered.   Wheezing - Plan: montelukast (SINGULAIR) 10 MG tablet, fluticasone (FLOVENT HFA) 44 MCG/ACT inhaler, DISCONTINUED: fluticasone (FLOVENT HFA) 44 MCG/ACT inhaler, DISCONTINUED: fluticasone (FLOVENT HFA) 44 MCG/ACT inhaler Shortness of breath - Plan: montelukast (SINGULAIR) 10 MG tablet  - albuterol as needed, option of flovent HFA if persistent use.  Meds ordered this encounter  Medications  . hydrochlorothiazide (HYDRODIURIL) 25 MG tablet    Sig: Take 1 tablet (25 mg total) by mouth daily.    Dispense:  90 tablet    Refill:  2  . valACYclovir (VALTREX) 500 MG tablet    Sig: TAKE 1 TABLET BY MOUTH ONCE DAILY INCREASE  TO  TWICE  DAILY  FOR  3  DAYS  DURING  OUTBREAK    Dispense:  90 tablet    Refill:  3  . DISCONTD: fluticasone (FLOVENT HFA) 44 MCG/ACT inhaler    Sig: Inhale 1 puff into the lungs 2 (two) times daily.    Dispense:  1 each    Refill:  6  .  montelukast (SINGULAIR) 10 MG tablet    Sig: Take 1 tablet (10 mg total) by mouth at bedtime.    Dispense:  90 tablet    Refill:  3  . DISCONTD: fluticasone (FLOVENT HFA) 44 MCG/ACT inhaler    Sig: Inhale 1 puff into the lungs 2 (two) times daily.    Dispense:  1 each    Refill:  6  . fluticasone (FLOVENT HFA) 44 MCG/ACT inhaler    Sig: Inhale 1 puff into the lungs 2 (two) times daily.    Dispense:  1 each    Refill:  6   Patient Instructions   Valtrex can be used daily for prevention of HSV, twice per day for flairs.  If you continue to require albuterol twice per week in the next 4-6 weeks, start flovent daily. Return to the clinic or go to the nearest emergency room if any of your symptoms worsen or new symptoms occur.  No change in other meds today.      If you have lab work done today you will be contacted with your lab results within the next 2 weeks.  If you have not heard from [place] then please contact us. The fastest way to get your results is to register for My Chart.   IF you received an x-ray today, you will receive an invoice from [facility]. Please contact [facility] at [phone] with questions or concerns regarding your invoice.   IF you received labwork today, you will receive an invoice from [facility]. Please contact [facility] at [phone] with questions or concerns regarding your invoice.   Our billing staff will not be able to assist you with questions regarding bills from these companies.  You will be contacted with the lab results as soon as they are available. The fastest way to get your results is to activate your My Chart account. Instructions are located on the last page of this paperwork. If you have not heard from [place] regarding the results in 2 weeks, please contact this office.         Signed, Merri Ray, MD [facility]

## 2020-06-26 NOTE — Patient Instructions (Addendum)
Valtrex can be used daily for prevention of HSV, twice per day for flairs.  If you continue to require albuterol twice per week in the next 4-6 weeks, start flovent daily. Return to the clinic or go to the nearest emergency room if any of your symptoms worsen or new symptoms occur.  No change in other meds today.      If you have lab work done today you will be contacted with your lab results within the next 2 weeks.  If you have not heard from [place] then please contact us. The fastest way to get your results is to register for My Chart.   IF you received an x-ray today, you will receive an invoice from [facility]. Please contact [facility] at [phone] with questions or concerns regarding your invoice.   IF you received labwork today, you will receive an invoice from [facility]. Please contact [facility] at [phone] with questions or concerns regarding your invoice.   Our billing staff will not be able to assist you with questions regarding bills from these companies.  You will be contacted with the lab results as soon as they are available. The fastest way to get your results is to activate your My Chart account. Instructions are located on the last page of this paperwork. If you have not heard from [place] regarding the results in 2 weeks, please contact this office.

## 2020-06-27 LAB — COMPREHENSIVE METABOLIC PANEL
ALT: 11 IU/L (ref 0–32)
AST: 16 IU/L (ref 0–40)
Albumin/Globulin Ratio: 1.7 (ref 1.2–2.2)
Albumin: 4.5 g/dL (ref 3.8–4.8)
Alkaline Phosphatase: 55 IU/L (ref 44–121)
BUN/Creatinine Ratio: 11 (ref 9–23)
BUN: 9 mg/dL (ref 6–24)
Bilirubin Total: <0.2 mg/dL (ref 0.0–1.2)
CO2: 27 mmol/L (ref 20–29)
Calcium: 9.8 mg/dL (ref 8.7–10.2)
Chloride: 100 mmol/L (ref 96–106)
Creatinine, Ser: 0.85 mg/dL (ref 0.57–1.00)
GFR calc Af Amer: 97 mL/min/{1.73_m2} (ref >59)
GFR calc non Af Amer: 84 mL/min/{1.73_m2} (ref >59)
Globulin, Total: 2.6 g/dL (ref 1.5–4.5)
Glucose: 101 mg/dL — ABNORMAL HIGH (ref 65–99)
Potassium: 4.3 mmol/L (ref 3.5–5.2)
Sodium: 139 mmol/L (ref 134–144)
Total Protein: 7.1 g/dL (ref 6.0–8.5)

## 2020-06-27 LAB — HEMOGLOBIN A1C
Est. average glucose Bld gHb Est-mCnc: 131 mg/dL
Hgb A1c MFr Bld: 6.2 % — ABNORMAL HIGH (ref 4.8–5.6)

## 2020-06-27 LAB — LIPID PANEL
Chol/HDL Ratio: 4.5 ratio — ABNORMAL HIGH (ref 0.0–4.4)
Cholesterol, Total: 202 mg/dL — ABNORMAL HIGH (ref 100–199)
HDL: 45 mg/dL (ref >39)
LDL Chol Calc (NIH): 123 mg/dL — ABNORMAL HIGH (ref 0–99)
Triglycerides: 190 mg/dL — ABNORMAL HIGH (ref 0–149)
VLDL Cholesterol Cal: 34 mg/dL (ref 5–40)

## 2020-06-30 ENCOUNTER — Encounter: Payer: Self-pay | Admitting: Family Medicine

## 2020-07-03 ENCOUNTER — Encounter: Payer: Self-pay | Admitting: Emergency Medicine

## 2020-07-03 ENCOUNTER — Ambulatory Visit
Admission: EM | Admit: 2020-07-03 | Discharge: 2020-07-03 | Disposition: A | Discharge status: Other Acute Inpatient Hospital | Payer: 59 | Source: Home / Self Care

## 2020-07-03 ENCOUNTER — Other Ambulatory Visit: Payer: Self-pay

## 2020-07-03 ENCOUNTER — Emergency Department (HOSPITAL_BASED_OUTPATIENT_CLINIC_OR_DEPARTMENT_OTHER)
Admission: EM | Admit: 2020-07-03 | Discharge: 2020-07-03 | Disposition: A | Discharge status: Home / Self Care | Payer: 59 | Attending: Emergency Medicine | Admitting: Emergency Medicine

## 2020-07-03 ENCOUNTER — Emergency Department (HOSPITAL_BASED_OUTPATIENT_CLINIC_OR_DEPARTMENT_OTHER): Payer: 59

## 2020-07-03 ENCOUNTER — Encounter (HOSPITAL_BASED_OUTPATIENT_CLINIC_OR_DEPARTMENT_OTHER): Payer: Self-pay | Admitting: *Deleted

## 2020-07-03 DIAGNOSIS — R0602 Shortness of breath: Secondary | ICD-10-CM

## 2020-07-03 DIAGNOSIS — R079 Chest pain, unspecified: Secondary | ICD-10-CM

## 2020-07-03 DIAGNOSIS — E876 Hypokalemia: Secondary | ICD-10-CM | POA: Insufficient documentation

## 2020-07-03 DIAGNOSIS — R0789 Other chest pain: Secondary | ICD-10-CM | POA: Diagnosis present

## 2020-07-03 DIAGNOSIS — Z87891 Personal history of nicotine dependence: Secondary | ICD-10-CM | POA: Insufficient documentation

## 2020-07-03 DIAGNOSIS — I10 Essential (primary) hypertension: Secondary | ICD-10-CM | POA: Insufficient documentation

## 2020-07-03 DIAGNOSIS — Z79899 Other long term (current) drug therapy: Secondary | ICD-10-CM | POA: Insufficient documentation

## 2020-07-03 LAB — BASIC METABOLIC PANEL
Anion gap: 11 (ref 5–15)
BUN: 10 mg/dL (ref 6–20)
CO2: 26 mmol/L (ref 22–32)
Calcium: 9.4 mg/dL (ref 8.9–10.3)
Chloride: 97 mmol/L — ABNORMAL LOW (ref 98–111)
Creatinine, Ser: 0.89 mg/dL (ref 0.44–1.00)
GFR, Estimated: >60 mL/min (ref >60)
Glucose, Bld: 90 mg/dL (ref 70–99)
Potassium: 3.2 mmol/L — ABNORMAL LOW (ref 3.5–5.1)
Sodium: 134 mmol/L — ABNORMAL LOW (ref 135–145)

## 2020-07-03 LAB — CBC
HCT: 43.6 % (ref 36.0–46.0)
Hemoglobin: 14.2 g/dL (ref 12.0–15.0)
MCH: 22.6 pg — ABNORMAL LOW (ref 26.0–34.0)
MCHC: 32.6 g/dL (ref 30.0–36.0)
MCV: 69.3 fL — ABNORMAL LOW (ref 80.0–100.0)
Platelets: 302 10*3/uL (ref 150–400)
RBC: 6.29 MIL/uL — ABNORMAL HIGH (ref 3.87–5.11)
RDW: 18.4 % — ABNORMAL HIGH (ref 11.5–15.5)
WBC: 9.1 10*3/uL (ref 4.0–10.5)
nRBC: 0 % (ref 0.0–0.2)

## 2020-07-03 LAB — PREGNANCY, URINE: Preg Test, Ur: NEGATIVE

## 2020-07-03 LAB — TROPONIN I (HIGH SENSITIVITY)
Troponin I (High Sensitivity): 3 ng/L (ref <18)
Troponin I (High Sensitivity): 3 ng/L (ref <18)

## 2020-07-03 MED ORDER — POTASSIUM CHLORIDE CRYS ER 20 MEQ PO TBCR
40.0000 meq | EXTENDED_RELEASE_TABLET | Freq: Once | ORAL | Status: AC
  Administered 2020-07-03: 40 meq via ORAL
  Filled 2020-07-03: qty 2

## 2020-07-03 MED ORDER — POTASSIUM CHLORIDE CRYS ER 20 MEQ PO TBCR: 20.0000 meq | EXTENDED_RELEASE_TABLET | Freq: Every day | ORAL | 0 refills | Status: DC

## 2020-07-03 NOTE — ED Provider Notes (Signed)
[name]    CSN: [id number] Arrival date & time: 07/03/20  1034      History   Chief Complaint Chief Complaint  Patient presents with  . Chest Pain    HPI Robin Fox is a 45 y.o. female.   HPI  Patient presents today with 3 days of gradually worsening left-sided chest wall pain.  Patient describes the pain as pressure and squeezing and has been occurring intermittently over the last 3 days.  She endorses that the pain over the last 24 hours to seem to get worse and she is having some associated left arm pain which she was treated for by her PCP and prescribed methocarbamol.  She also endorses increased use of her albuterol inhaler and neb treatments however continues to have shortness of breath and wheezing.  She notes that the shortness of breath seems to be worse over the last 24 hours.  She has no documented history of CHF or PE.  Blood pressure is slightly elevated on arrival however patient has underlying history of hypertension.  She recently had a follow-up appointment at her primary care provider on 06/27/2019 with normal labs.  Take care  Past Medical History:  Diagnosis Date  . Anemia    hx   . Gestational diabetes    Neg 2 hr test after preg  . Headache(784.0)   . Herpes genitalia    2013 - 1st outbreak  . Hypertension    2008  . Pre-eclampsia in third trimester   . Seasonal allergies   . Shortness of breath    w exertion  . Trichomonas   . Twins     Patient Active Problem List   Diagnosis Date Noted  . Screening breast examination 03/30/2019  . Breast pain 03/30/2019  . Tobacco use 08/01/2016  . Routine postpartum follow-up 05/22/2012  . Mild or unspecified pre-eclampsia, with delivery 04/07/2012  . Abnormal maternal glucose tolerance, antepartum 03/27/2012  . History of IUGR (intrauterine growth retardation) and stillbirth, currently pregnant 02/12/2012  . History of stillbirth 12/12/2011  . Previous cesarean delivery,  antepartum condition or complication 17/40/8144  . Obesity (BMI 30-39.9) 11/21/2011  . Hx of herpes genitalis 11/21/2011  . Hypertension in pregnancy, essential, antepartum 11/09/2011  . Essential hypertension 11/09/2011  . AMA (advanced maternal age) multigravida 35+ 11/09/2011    Past Surgical History:  Procedure Laterality Date  . CESAREAN SECTION     X 2  . CESAREAN SECTION  04/21/2012   Procedure: CESAREAN SECTION;  Surgeon: Woodroe Mode, MD;  Location: [facility];  Service: Obstetrics;  Laterality: N/A;  . TOOTH EXTRACTION      OB History    Gravida  4   Para  3   Term  1   Preterm  2   AB  0   Living  4     SAB  0   IAB  0   Ectopic  0   Multiple  2   Live Births  4            Home Medications    Prior to Admission medications   Medication Sig Start Date End Date Taking? Authorizing Provider  albuterol (PROVENTIL) (2.5 MG/3ML) 0.083% nebulizer solution Take 3 mLs (2.5 mg total) by nebulization every 6 (six) hours as needed for wheezing or shortness of breath. 05/15/20   [name], NP  albuterol (VENTOLIN HFA) 108 (90 Base) MCG/ACT inhaler Inhale 1-2 puffs into the lungs every 6 (six) hours  as needed for wheezing or shortness of breath. 05/05/20   Wieters, Hallie C, PA-C  ferrous sulfate 325 (65 FE) MG tablet Take 325 mg by mouth daily with breakfast.    [provider]  fluticasone (FLOVENT HFA) 44 MCG/ACT inhaler Inhale 1 puff into the lungs 2 (two) times daily. 06/26/20   [name], MD  hydrochlorothiazide (HYDRODIURIL) 25 MG tablet Take 1 tablet (25 mg total) by mouth daily. 06/26/20   [name], MD  hydrOXYzine (ATARAX/VISTARIL) 25 MG tablet Take 0.5-1 tablets (12.5-25 mg total) by mouth every 8 (eight) hours as needed for itching. 12/21/19   Jaynee Eagles, PA-C  ibuprofen (ADVIL) 800 MG tablet Take 1 tablet (800 mg total) by mouth every 8 (eight) hours as needed for moderate pain. 12/18/19   [name], NP   montelukast (SINGULAIR) 10 MG tablet Take 1 tablet (10 mg total) by mouth at bedtime. 06/26/20   [name], MD  Multiple Vitamin (MULTIVITAMIN) tablet Take 1 tablet by mouth daily.    [provider]  valACYclovir (VALTREX) 500 MG tablet TAKE 1 TABLET BY MOUTH ONCE DAILY INCREASE  TO  TWICE  DAILY  FOR  3  DAYS  DURING  OUTBREAK 06/26/20   [name], MD    Family History Family History  Problem Relation Age of Onset  . Hypertension Mother   . Stroke Mother   . Stroke Maternal Grandmother   . Heart disease Maternal Grandfather 60  . Hypertension Brother   . Diabetes Brother   . Stomach cancer Brother   . Hypertension Brother   . Diabetes Brother     Social History Social History   Tobacco Use  . Smoking status: Former Smoker    Packs/day: 0.25    Years: 17.00    Pack years: 4.25    Types: Cigarettes    Quit date: 09/11/2019    Years since quitting: 0.8  . Smokeless tobacco: Never Used  Vaping Use  . Vaping Use: Never used  Substance Use Topics  . Alcohol use: Yes    Comment: socially  . Drug use: No     Allergies   Penicillins   Review of Systems Review of Systems Pertinent negatives listed in HPI  Physical Exam Triage Vital Signs ED Triage Vitals [07/03/20 1157]  Enc Vitals Group     BP (!) 146/92     Pulse Rate 79     Resp 18     Temp 98 F (36.7 C)     Temp Source Oral     SpO2 98 %     Weight      Height      Head Circumference      Peak Flow      Pain Score 5     Pain Loc      Pain Edu?      Excl. in [place]?     Updated Vital Signs BP (!) 146/92 (BP Location: Left Arm)   Pulse 79   Temp 98 F (36.7 C) (Oral)   Resp 18   LMP 06/15/2020 (Exact Date)   SpO2 98%   Visual Acuity Right Eye Distance:   Left Eye Distance:   Bilateral Distance:    Right Eye Near:   Left Eye Near:    Bilateral Near:     Physical Exam Constitutional:      Appearance: She is obese. She is not ill-appearing.  Cardiovascular:      Rate and Rhythm:  Normal rate and regular rhythm.     Heart sounds: No murmur heard.   Pulmonary:     Effort: Pulmonary effort is normal. No accessory muscle usage.     Breath sounds: No decreased breath sounds or rales.  Musculoskeletal:     Cervical back: Normal range of motion.  Skin:    General: Skin is warm.     Capillary Refill: Capillary refill takes less than 2 seconds.  Neurological:     General: No focal deficit present.     Mental Status: She is alert.  Psychiatric:        Mood and Affect: Mood normal.        Behavior: Behavior normal.     UC Treatments / Results  Labs (all labs ordered are listed, but only abnormal results are displayed) Labs Reviewed - No data to display  EKG EKG normal sinus rhythm with right atrial enlargement and left axis deviation rate 83 bpm no ST changes.  Radiology No results found.  Procedures Procedures (including critical care time)  Medications Ordered in UC Medications - No data to display  Initial Impression / Assessment and Plan / UC Course  I have reviewed the triage vital signs and the nursing notes.  Pertinent labs & imaging results that were available during my care of the patient were reviewed by me and considered in my medical decision making (see chart for details).    Patient presents for evaluation of left-sided chest pain which she describes as chest pressure with associated shortness of breath and coughing. Ordered chest x-ray as EKG was significant for some right atrial enlargement as well as some left axis deviation consistent with underlying pulmonary disease however patient did not want to wait here at [facility] for imaging and requested to be discharged to follow-up at the ER. Plan to obtain a chest x-ray to rule out any pneumonia vs pulmonary edema as source of her left-sided chest pain as patient has had pneumonia in the past given associated symptoms of cough and worsening of asthma related symptoms of wheezing.   Chest x-ray DC'd patient discharged with instructions to go to [facility]. Final Clinical Impressions(s) / UC Diagnoses   Final diagnoses:  Chest pain, unspecified type  SOB (shortness of breath)     Discharge Instructions           ED Prescriptions    None     PDMP not reviewed this encounter.   Scot Jun, FNP 07/03/20 1304

## 2020-07-03 NOTE — ED Triage Notes (Signed)
Pt here for mid sternal chest tightness that is intermittent and worse with cough x 3 days; pt sts some cough but not consistent

## 2020-07-03 NOTE — ED Provider Notes (Signed)
[facility] DEPART[facility] Provider Note   CSN: [id number] Arrival date & time: 07/03/20  1418     History Chief Complaint  Patient presents with  . Chest Pain  . Shortness of Breath  . Arm Pain    Robin Fox is a 45 y.o. female w PMHx HTN, asthma, presenting to the ED from UC with complaint of intermittent left sided chest pain that began 1 week ago.  Patient states she first noticed her symptoms a week ago.  Since yesterday been coming more frequently, occurs at rest.  She describes it as a tightness sensation in her left chest causes sharp pain.  It lasts a few seconds in duration.  She feels some shortness of breath with that.  Sometimes her pain goes towards her left arm.  She has no nausea, diaphoresis, abdominal pain.  She has noticed needing to use her albuterol more frequently.  She was evaluated at urgent care today and sent here for further evaluation.  No personal history of CAD.  No first-degree relatives with CAD before the age of 73.  Per recent laboratory work-up at PCP, lipid panel reveals minimally elevated cholesterol and triglycerides.  A1c is minimally elevated at 6.2.  No history of DVT or PE, no unilateral leg pain or swelling, no recent immobilization or surgery, no exogenous estrogen use, no hemoptysis.  The history is provided by the patient.       Past Medical History:  Diagnosis Date  . Anemia    hx   . Gestational diabetes    Neg 2 hr test after preg  . Headache(784.0)   . Herpes genitalia    2013 - 1st outbreak  . Hypertension    2008  . Pre-eclampsia in third trimester   . Seasonal allergies   . Shortness of breath    w exertion  . Trichomonas   . Twins     Patient Active Problem List   Diagnosis Date Noted  . Screening breast examination 03/30/2019  . Breast pain 03/30/2019  . Tobacco use 08/01/2016  . Routine postpartum follow-up 05/22/2012  . Mild or unspecified pre-eclampsia, with delivery 04/07/2012  .  Abnormal maternal glucose tolerance, antepartum 03/27/2012  . History of IUGR (intrauterine growth retardation) and stillbirth, currently pregnant 02/12/2012  . History of stillbirth 12/12/2011  . Previous cesarean delivery, antepartum condition or complication 0000000  . Obesity (BMI 30-39.9) 11/21/2011  . Hx of herpes genitalis 11/21/2011  . Hypertension in pregnancy, essential, antepartum 11/09/2011  . Essential hypertension 11/09/2011  . AMA (advanced maternal age) multigravida 35+ 11/09/2011    Past Surgical History:  Procedure Laterality Date  . CESAREAN SECTION     X 2  . CESAREAN SECTION  04/21/2012   Procedure: CESAREAN SECTION;  Surgeon: Woodroe Mode, MD;  Location: [facility];  Service: Obstetrics;  Laterality: N/A;  . TOOTH EXTRACTION       OB History    Gravida  4   Para  3   Term  1   Preterm  2   AB  0   Living  4     SAB  0   IAB  0   Ectopic  0   Multiple  2   Live Births  4           Family History  Problem Relation Age of Onset  . Hypertension Mother   . Stroke Mother   . Stroke Maternal Grandmother   . Heart disease Maternal  Grandfather 60  . Hypertension Brother   . Diabetes Brother   . Stomach cancer Brother   . Hypertension Brother   . Diabetes Brother     Social History   Tobacco Use  . Smoking status: Former Smoker    Packs/day: 0.25    Years: 17.00    Pack years: 4.25    Types: Cigarettes    Quit date: 09/11/2019    Years since quitting: 0.8  . Smokeless tobacco: Never Used  Vaping Use  . Vaping Use: Never used  Substance Use Topics  . Alcohol use: Yes    Comment: socially  . Drug use: No    Home Medications Prior to Admission medications   Medication Sig Start Date End Date Taking? Authorizing Provider  potassium chloride SA (KLOR-CON) 20 MEQ tablet Take 1 tablet (20 mEq total) by mouth daily for 2 days. 07/03/20 07/05/20 Yes Quentin Cornwall, [place] N, PA-C  albuterol (PROVENTIL) (2.5 MG/3ML) 0.083% nebulizer  solution Take 3 mLs (2.5 mg total) by nebulization every 6 (six) hours as needed for wheezing or shortness of breath. 05/15/20   [name], NP  albuterol (VENTOLIN HFA) 108 (90 Base) MCG/ACT inhaler Inhale 1-2 puffs into the lungs every 6 (six) hours as needed for wheezing or shortness of breath. 05/05/20   Wieters, Hallie C, PA-C  ferrous sulfate 325 (65 FE) MG tablet Take 325 mg by mouth daily with breakfast.    [provider]  fluticasone (FLOVENT HFA) 44 MCG/ACT inhaler Inhale 1 puff into the lungs 2 (two) times daily. 06/26/20   [name], MD  hydrochlorothiazide (HYDRODIURIL) 25 MG tablet Take 1 tablet (25 mg total) by mouth daily. 06/26/20   [name], MD  hydrOXYzine (ATARAX/VISTARIL) 25 MG tablet Take 0.5-1 tablets (12.5-25 mg total) by mouth every 8 (eight) hours as needed for itching. 12/21/19   Jaynee Eagles, PA-C  ibuprofen (ADVIL) 800 MG tablet Take 1 tablet (800 mg total) by mouth every 8 (eight) hours as needed for moderate pain. 12/18/19   [name], NP  montelukast (SINGULAIR) 10 MG tablet Take 1 tablet (10 mg total) by mouth at bedtime. 06/26/20   [name], MD  Multiple Vitamin (MULTIVITAMIN) tablet Take 1 tablet by mouth daily.    [provider]  valACYclovir (VALTREX) 500 MG tablet TAKE 1 TABLET BY MOUTH ONCE DAILY INCREASE  TO  TWICE  DAILY  FOR  3  DAYS  DURING  OUTBREAK 06/26/20   [name], MD    Allergies    Penicillins  Review of Systems   Review of Systems  All other systems reviewed and are negative.   Physical Exam Updated Vital Signs BP 127/84   Pulse 65   Temp 97.7 F (36.5 C) (Oral)   Resp 20   Ht 5' 7.5" (1.715 m)   Wt 107.8 kg   LMP 06/15/2020 (Exact Date)   SpO2 100%   BMI 36.66 kg/m   Physical Exam Vitals and nursing note reviewed.  Constitutional:      General: She is not in acute distress.    Appearance: She is well-developed and well-nourished. She is not ill-appearing.  HENT:      Head: Normocephalic and atraumatic.  Eyes:     Conjunctiva/sclera: Conjunctivae normal.  Cardiovascular:     Rate and Rhythm: Normal rate and regular rhythm.  Pulmonary:     Effort: Pulmonary effort is normal. No respiratory distress.     Breath sounds: Normal breath sounds.  Chest:     Chest wall: Tenderness (Left pectoral region with tenderness.  No skin changes.) present.  Abdominal:     General: Bowel sounds are normal.     Palpations: Abdomen is soft.     Tenderness: There is no abdominal tenderness. There is no guarding or rebound.  Musculoskeletal:        General: No tenderness (No calf tenderness).     Right lower leg: No edema.     Left lower leg: No edema.  Skin:    General: Skin is warm.  Neurological:     Mental Status: She is alert.  Psychiatric:        Mood and Affect: Mood and affect normal.        Behavior: Behavior normal.     ED Results / Procedures / Treatments   Labs (all labs ordered are listed, but only abnormal results are displayed) Labs Reviewed  BASIC METABOLIC PANEL - Abnormal; Notable for the following components:      Result Value   Sodium 134 (*)    Potassium 3.2 (*)    Chloride 97 (*)    All other components within normal limits  CBC - Abnormal; Notable for the following components:   RBC 6.29 (*)    MCV 69.3 (*)    MCH 22.6 (*)    RDW 18.4 (*)    All other components within normal limits  PREGNANCY, URINE  TROPONIN I (HIGH SENSITIVITY)  TROPONIN I (HIGH SENSITIVITY)    EKG None  Radiology DG Chest 2 View  Result Date: 07/03/2020 CLINICAL DATA:  45 year old female with chest pain. EXAM: CHEST - 2 VIEW COMPARISON:  Chest radiograph dated 05/15/2020. FINDINGS: The heart size and mediastinal contours are within normal limits. Both lungs are clear. The visualized skeletal structures are unremarkable. IMPRESSION: No active cardiopulmonary disease. Electronically Signed   By: Anner Crete M.D.   On: 07/03/2020 15:03     Procedures Procedures   Medications Ordered in ED Medications  potassium chloride SA (KLOR-CON) CR tablet 40 mEq (40 mEq Oral Given 07/03/20 1641)    ED Course  I have reviewed the triage vital signs and the nursing notes.  Pertinent labs & imaging results that were available during my care of the patient were reviewed by me and considered in my medical decision making (see chart for details).  Clinical Course as of 07/03/20 1909  Mon Jul 03, 2020  1625 I was directly involved in this patients medical care.  [JH]    Clinical Course User Index [JH] Luna Fuse, MD   MDM Rules/Calculators/A&P                          Patient presenting for brief episodes of intermittent left-sided tightness in her chest causing sharp pains.  Coming and going for the last week, more frequent since yesterday.  Low risk [place].  Low heart score 1.  Low suspicion for ACS, presentation is atypical..  Pain is reproducible with palpation of the chest wall seems more likely to be chest wall pain.  Troponins x2 are negative.  Chest x-ray is negative.  EKG is nonischemic.  She has mildly low potassium, replaced orally.  Recommend she follow with PCP and  treat symptomatically.  She is agreeable with this plan and appropriate for discharge. Final Clinical Impression(s) / ED Diagnoses Final diagnoses:  Hypokalemia  Left-sided chest wall pain    Rx / DC Orders ED Discharge  Orders         Ordered    potassium chloride SA (KLOR-CON) 20 MEQ tablet  Daily        07/03/20 1817           Calirose Mccance, [place] N, PA-C 07/03/20 1912    Luna Fuse, MD 07/07/20 2128

## 2020-07-03 NOTE — Discharge Instructions (Signed)
Please read instructions below. Follow up with your primary care provider in 3 days.  Your potassium is a little low today, have your potassium rechecked by your primary care.  Return to the ER for new or worsening symptoms; including worsening chest pain, shortness of breath, pain that radiates to the arm or neck, pain or shortness of breath worsened with exertion.

## 2020-07-03 NOTE — ED Notes (Signed)
Patient transported to X-ray 

## 2020-07-03 NOTE — ED Triage Notes (Signed)
She has been having pain in her left chest and left arm with SOB for a week. She was seen today at [facility] and told to come here for further testing.

## 2020-07-05 ENCOUNTER — Other Ambulatory Visit: Payer: Self-pay

## 2020-07-05 ENCOUNTER — Encounter: Payer: Self-pay | Admitting: Family Medicine

## 2020-07-05 ENCOUNTER — Ambulatory Visit: Payer: 59 | Admitting: Family Medicine

## 2020-07-05 VITALS — BP 134/90 | HR 94 | Temp 97.9°F | Ht 67.5 in | Wt 234.0 lb

## 2020-07-05 DIAGNOSIS — E876 Hypokalemia: Secondary | ICD-10-CM | POA: Diagnosis not present

## 2020-07-05 DIAGNOSIS — R0789 Other chest pain: Secondary | ICD-10-CM | POA: Diagnosis not present

## 2020-07-05 LAB — BASIC METABOLIC PANEL
BUN/Creatinine Ratio: 9 (ref 9–23)
BUN: 8 mg/dL (ref 6–24)
CO2: 21 mmol/L (ref 20–29)
Calcium: 9.8 mg/dL (ref 8.7–10.2)
Chloride: 100 mmol/L (ref 96–106)
Creatinine, Ser: 0.93 mg/dL (ref 0.57–1.00)
GFR calc Af Amer: 87 mL/min/{1.73_m2} (ref >59)
GFR calc non Af Amer: 76 mL/min/{1.73_m2} (ref >59)
Glucose: 112 mg/dL — ABNORMAL HIGH (ref 65–99)
Potassium: 4 mmol/L (ref 3.5–5.2)
Sodium: 138 mmol/L (ref 134–144)

## 2020-07-05 MED ORDER — POTASSIUM CHLORIDE CRYS ER 20 MEQ PO TBCR: 20.0000 meq | EXTENDED_RELEASE_TABLET | Freq: Every day | ORAL | 0 refills | Status: DC

## 2020-07-05 NOTE — Progress Notes (Signed)
Subjective:  Patient ID: Robin Fox, female    DOB: [birth year]  Age: 45 y.o. MRN: [id number]  CC:  Chief Complaint  Patient presents with  . Hospitalization Follow-up    Pt went to the hospital on 07/03/2020. Pt wasn't admitted. Pt went to the ER due to chest pain. Pt was informed her Pot[place], was low and that was causing the chest pain. Pt states since starting the potassium medication the ER gave her she hasn't felt the chest pain as much. Pt reports the pain went from every few minutes to a couple times a day since starting the medication.    HPI Robin Fox presents for  Chest pain, hypokalemia  Evaluated through [facility] urgent care 2 days ago.  Notes reviewed.  3-day history of left-sided chest wall pain, pressure, squeezing.  Associated shortness of breath, wheezing with use of albuterol.  Some initial testing through urgent care, then evaluated through the ER. Hypokalemic with potassium 3.2, took 40meq there, then started on potassium 20 mEq daily. Low risk Wells criteria, low heart score.  Low suspicion for acute coronary syndrome.  Pain reproducible, thought to be chest wall pain.  Troponin x2 -, chest x-ray negative, EKG nonischemic.  PCP follow-up recommended.  Has been taking potassium daily.   Feeling better on supplements. Chest pain/tightnening is improving - only feels a little. Has not needed inhaler today -took once yesterday, has been using her inhaler 2 times per day for a few days before going to the ER. No fevers, breathing ok.  Tx: none, no otc meds currently.     history Patient Active Problem List   Diagnosis Date Noted  . Screening breast examination 03/30/2019  . Breast pain 03/30/2019  . Tobacco use 08/01/2016  . Routine postpartum follow-up 05/22/2012  . Mild or unspecified pre-eclampsia, with delivery 04/07/2012  . Abnormal maternal glucose tolerance, antepartum 03/27/2012  . History of IUGR (intrauterine growth retardation)  and stillbirth, currently pregnant 02/12/2012  . History of stillbirth 12/12/2011  . Previous cesarean delivery, antepartum condition or complication 48/54/6270  . Obesity (BMI 30-39.9) 11/21/2011  . Hx of herpes genitalis 11/21/2011  . Hypertension in pregnancy, essential, antepartum 11/09/2011  . Essential hypertension 11/09/2011  . AMA (advanced maternal age) multigravida 35+ 11/09/2011   Past Medical History:  Diagnosis Date  . Anemia    hx   . Gestational diabetes    Neg 2 hr test after preg  . Headache(784.0)   . Herpes genitalia    2013 - 1st outbreak  . Hypertension    2008  . Pre-eclampsia in third trimester   . Seasonal allergies   . Shortness of breath    w exertion  . Trichomonas   . Twins    Past Surgical History:  Procedure Laterality Date  . CESAREAN SECTION     X 2  . CESAREAN SECTION  04/21/2012   Procedure: CESAREAN SECTION;  Surgeon: Woodroe Mode, MD;  Location: [facility];  Service: Obstetrics;  Laterality: N/A;  . TOOTH EXTRACTION     Allergies  Allergen Reactions  . Penicillins Hives, Shortness Of Breath and Swelling    Has patient had a PCN reaction causing immediate rash, facial/tongue/throat swelling, SOB or lightheadedness with hypotension: no Has patient had a PCN reaction causing severe rash involving mucus membranes or skin necrosis:no Has patient had a PCN reaction that required hospitalization no Has patient had a PCN reaction occurring within the last 10 years: {Yes If all of  the above answers are "NO", then may proceed with Cephalosporin use.   Prior to Admission medications   Medication Sig Start Date End Date Taking? Authorizing Provider  albuterol (PROVENTIL) (2.5 MG/3ML) 0.083% nebulizer solution Take 3 mLs (2.5 mg total) by nebulization every 6 (six) hours as needed for wheezing or shortness of breath. 05/15/20  Yes [name], NP  albuterol (VENTOLIN HFA) 108 (90 Base) MCG/ACT inhaler Inhale 1-2 puffs into the lungs every 6  (six) hours as needed for wheezing or shortness of breath. 05/05/20  Yes Wieters, Hallie C, PA-C  ferrous sulfate 325 (65 FE) MG tablet Take 325 mg by mouth daily with breakfast.   Yes [provider]  fluticasone (FLOVENT HFA) 44 MCG/ACT inhaler Inhale 1 puff into the lungs 2 (two) times daily. 06/26/20  Yes [name], MD  hydrochlorothiazide (HYDRODIURIL) 25 MG tablet Take 1 tablet (25 mg total) by mouth daily. 06/26/20  Yes [name], MD  hydrOXYzine (ATARAX/VISTARIL) 25 MG tablet Take 0.5-1 tablets (12.5-25 mg total) by mouth every 8 (eight) hours as needed for itching. 12/21/19  Yes Jaynee Eagles, PA-C  ibuprofen (ADVIL) 800 MG tablet Take 1 tablet (800 mg total) by mouth every 8 (eight) hours as needed for moderate pain. 12/18/19  Yes [name], NP  montelukast (SINGULAIR) 10 MG tablet Take 1 tablet (10 mg total) by mouth at bedtime. 06/26/20  Yes [name], MD  Multiple Vitamin (MULTIVITAMIN) tablet Take 1 tablet by mouth daily.   Yes [provider]  potassium chloride SA (KLOR-CON) 20 MEQ tablet Take 1 tablet (20 mEq total) by mouth daily for 2 days. 07/03/20 07/05/20 Yes Quentin Cornwall, [place] N, PA-C  valACYclovir (VALTREX) 500 MG tablet TAKE 1 TABLET BY MOUTH ONCE DAILY INCREASE  TO  TWICE  DAILY  FOR  3  DAYS  DURING  OUTBREAK 06/26/20  Yes [name], MD   Social History   Socioeconomic History  . Marital status: Married    Spouse name: Not on file  . Number of children: Not on file  . Years of education: Not on file  . Highest education level: Not on file  Occupational History  . Not on file  Tobacco Use  . Smoking status: Former Smoker    Packs/day: 0.25    Years: 17.00    Pack years: 4.25    Types: Cigarettes    Quit date: 09/11/2019    Years since quitting: 0.8  . Smokeless tobacco: Never Used  Vaping Use  . Vaping Use: Never used  Substance and Sexual Activity  . Alcohol use: Yes    Comment: socially  . Drug use: No  .  Sexual activity: Yes    Birth control/protection: Other-see comments, None    Comment: Partner had vasectomy-1st intercourse 14 yo-More than 5 partners  Other Topics Concern  . Not on file  Social History Narrative  . Not on file   Social Determinants of Health   Financial Resource Strain: Not on file  Food Insecurity: No Food Insecurity  . Worried About Charity fundraiser in the Last Year: Never true  . Ran Out of Food in the Last Year: Never true  Transportation Needs: No Transportation Needs  . Lack of Transportation (Medical): No  . Lack of Transportation (Non-Medical): No  Physical Activity: Not on file  Stress: Not on file  Social Connections: Not on file  Intimate Partner Violence: Not on file    Review of Systems  Per  HPI.  Objective:   Vitals:   07/05/20 1000  BP: 134/90  Pulse: 94  Temp: 97.9 F (36.6 C)  TempSrc: Temporal  SpO2: 98%  Weight: 234 lb (106.1 kg)  Height: 5' 7.5" (1.715 m)     Physical Exam Vitals reviewed.  Constitutional:      Appearance: She is well-developed and well-nourished.  HENT:     Head: Normocephalic and atraumatic.  Eyes:     Extraocular Movements: EOM normal.     Conjunctiva/sclera: Conjunctivae normal.     Pupils: Pupils are equal, round, and reactive to light.  Neck:     Vascular: No carotid bruit.  Cardiovascular:     Rate and Rhythm: Normal rate and regular rhythm.     Pulses: Intact distal pulses.     Heart sounds: Normal heart sounds.  Pulmonary:     Effort: Pulmonary effort is normal. No respiratory distress.     Breath sounds: Normal breath sounds. No wheezing.     Comments: Upper chest wall nontender in previous area of discomfort.  Asymptomatic at present. Abdominal:     Palpations: Abdomen is soft. There is no pulsatile mass.     Tenderness: There is no abdominal tenderness.  Skin:    General: Skin is warm and dry.  Neurological:     Mental Status: She is alert and oriented to person, place, and time.   Psychiatric:        Mood and Affect: Mood and affect normal.        Behavior: Behavior normal.    Assessment & Plan:  Robin Fox is a 45 y.o. female . Hypokalemia - Plan: potassium chloride SA (KLOR-CON) 20 MEQ tablet, Basic metabolic panel, Basic metabolic panel  Chest wall pain  Improvement of symptoms since ER visit.  Continue potassium 20 mEq daily for now with repeat BMP today and then again in 1 week.  Potentially could have had slight lowering of potassium with frequent albuterol use previously.  Has Flovent HFA to minimize need for albuterol. RTC/ER/urgent care precautions if worsening symptoms.   Meds ordered this encounter  Medications  . potassium chloride SA (KLOR-CON) 20 MEQ tablet    Sig: Take 1 tablet (20 mEq total) by mouth daily.    Dispense:  30 tablet    Refill:  0   Patient Instructions    I will check potassium levels again today.  Okay to stay on potassium supplement once per day but recheck levels in the next week to 10 days.  Sometimes frequent use of albuterol can lower your potassium so would like to make sure that we are not overtreating that level.  I am glad to hear the chest pain has improved.  If any worsening symptoms be seen here, urgent care or emergency room as we discussed.   If you have lab work done today you will be contacted with your lab results within the next 2 weeks.  If you have not heard from [place] then please contact us. The fastest way to get your results is to register for My Chart.   IF you received an x-ray today, you will receive an invoice from [facility]. Please contact [facility] at [phone] with questions or concerns regarding your invoice.   IF you received labwork today, you will receive an invoice from [facility]. Please contact [facility] at [phone] with questions or concerns regarding your invoice.   Our billing staff will not be able to assist you with questions regarding bills  from  these companies.  You will be contacted with the lab results as soon as they are available. The fastest way to get your results is to activate your My Chart account. Instructions are located on the last page of this paperwork. If you have not heard from [place] regarding the results in 2 weeks, please contact this office.         Signed, Merri Ray, MD [facility]

## 2020-07-05 NOTE — Patient Instructions (Addendum)
  I will check potassium levels again today.  Okay to stay on potassium supplement once per day but recheck levels in the next week to 10 days.  Sometimes frequent use of albuterol can lower your potassium so would like to make sure that we are not overtreating that level.  I am glad to hear the chest pain has improved.  If any worsening symptoms be seen here, urgent care or emergency room as we discussed.   If you have lab work done today you will be contacted with your lab results within the next 2 weeks.  If you have not heard from [place] then please contact us. The fastest way to get your results is to register for My Chart.   IF you received an x-ray today, you will receive an invoice from [facility]. Please contact [facility] at [phone] with questions or concerns regarding your invoice.   IF you received labwork today, you will receive an invoice from [facility]. Please contact [facility] at [phone] with questions or concerns regarding your invoice.   Our billing staff will not be able to assist you with questions regarding bills from these companies.  You will be contacted with the lab results as soon as they are available. The fastest way to get your results is to activate your My Chart account. Instructions are located on the last page of this paperwork. If you have not heard from [place] regarding the results in 2 weeks, please contact this office.

## 2020-07-12 ENCOUNTER — Other Ambulatory Visit: Payer: Self-pay

## 2020-07-12 ENCOUNTER — Ambulatory Visit: Payer: 59

## 2020-07-12 DIAGNOSIS — E876 Hypokalemia: Secondary | ICD-10-CM

## 2020-07-12 LAB — BASIC METABOLIC PANEL
BUN/Creatinine Ratio: 9 (ref 9–23)
BUN: 9 mg/dL (ref 6–24)
CO2: 22 mmol/L (ref 20–29)
Calcium: 9.3 mg/dL (ref 8.7–10.2)
Chloride: 98 mmol/L (ref 96–106)
Creatinine, Ser: 0.98 mg/dL (ref 0.57–1.00)
GFR calc Af Amer: 82 mL/min/{1.73_m2} (ref >59)
GFR calc non Af Amer: 71 mL/min/{1.73_m2} (ref >59)
Glucose: 142 mg/dL — ABNORMAL HIGH (ref 65–99)
Potassium: 3.9 mmol/L (ref 3.5–5.2)
Sodium: 139 mmol/L (ref 134–144)

## 2020-07-14 ENCOUNTER — Ambulatory Visit
Admission: EM | Admit: 2020-07-14 | Discharge: 2020-07-14 | Disposition: A | Discharge status: Home / Self Care | Payer: 59 | Attending: Emergency Medicine | Admitting: Emergency Medicine

## 2020-07-14 ENCOUNTER — Other Ambulatory Visit: Payer: Self-pay

## 2020-07-14 DIAGNOSIS — Z20822 Contact with and (suspected) exposure to covid-19: Secondary | ICD-10-CM

## 2020-07-14 DIAGNOSIS — J209 Acute bronchitis, unspecified: Secondary | ICD-10-CM

## 2020-07-14 MED ORDER — BENZONATATE 100 MG PO CAPS: 200.0000 mg | ORAL_CAPSULE | Freq: Three times a day (TID) | ORAL | 0 refills | Status: DC

## 2020-07-14 MED ORDER — PREDNISONE 50 MG PO TABS: 50.0000 mg | ORAL_TABLET | Freq: Every day | ORAL | 0 refills | Status: AC

## 2020-07-14 NOTE — Discharge Instructions (Signed)
Covid/flu test pending-monitor MyChart for results Begin prednisone daily for the next 5 days-take with food and in the morning if you are able Tessalon/benzonatate every 8 hours for cough Please use with Mucinex DM twice daily Continue albuterol inhaler/nebulizers as needed Follow-up if not improving or worsening

## 2020-07-14 NOTE — ED Triage Notes (Addendum)
Pt c/o cough for the past 2 days. States woke up feeling miserable, wheezing, low grade temp, SOB, headache, dizzy, and fatigue. No distress noted. Pt states had PNA in December and feels the same.

## 2020-07-15 LAB — COVID-19, FLU A+B NAA
Influenza A, NAA: NOT DETECTED
Influenza B, NAA: NOT DETECTED
SARS-CoV-2, NAA: NOT DETECTED

## 2020-07-15 NOTE — ED Provider Notes (Signed)
[name]    CSN: [id number] Arrival date & time: 07/14/20  1759      History   Chief Complaint Chief Complaint  Patient presents with  . Cough    HPI Robin Fox is a 45 y.o. female presenting today for evaluation of cough congestion and wheezing.  Patient reports that over the past 2 days she has developed fatigue, generalized weakness, and feeling short of breath with wheezing.  Reports associated headaches and slight dizziness.  Feels similar to when she previously had pneumonia in December.  Had chest x-ray 1 week ago and was unremarkable.  Does report tobacco use, recently quit.  HPI  Past Medical History:  Diagnosis Date  . Anemia    hx   . Gestational diabetes    Neg 2 hr test after preg  . Headache(784.0)   . Herpes genitalia    2013 - 1st outbreak  . Hypertension    2008  . Pre-eclampsia in third trimester   . Seasonal allergies   . Shortness of breath    w exertion  . Trichomonas   . Twins     Patient Active Problem List   Diagnosis Date Noted  . Screening breast examination 03/30/2019  . Breast pain 03/30/2019  . Tobacco use 08/01/2016  . Routine postpartum follow-up 05/22/2012  . Mild or unspecified pre-eclampsia, with delivery 04/07/2012  . Abnormal maternal glucose tolerance, antepartum 03/27/2012  . History of IUGR (intrauterine growth retardation) and stillbirth, currently pregnant 02/12/2012  . History of stillbirth 12/12/2011  . Previous cesarean delivery, antepartum condition or complication 56/43/3295  . Obesity (BMI 30-39.9) 11/21/2011  . Hx of herpes genitalis 11/21/2011  . Hypertension in pregnancy, essential, antepartum 11/09/2011  . Essential hypertension 11/09/2011  . AMA (advanced maternal age) multigravida 35+ 11/09/2011    Past Surgical History:  Procedure Laterality Date  . CESAREAN SECTION     X 2  . CESAREAN SECTION  04/21/2012   Procedure: CESAREAN SECTION;  Surgeon: Woodroe Mode, MD;   Location: [facility];  Service: Obstetrics;  Laterality: N/A;  . TOOTH EXTRACTION      OB History    Gravida  4   Para  3   Term  1   Preterm  2   AB  0   Living  4     SAB  0   IAB  0   Ectopic  0   Multiple  2   Live Births  4            Home Medications    Prior to Admission medications   Medication Sig Start Date End Date Taking? Authorizing Provider  benzonatate (TESSALON) 100 MG capsule Take 2 capsules (200 mg total) by mouth every 8 (eight) hours. 07/14/20  Yes [name] C, PA-C  predniSONE (DELTASONE) 50 MG tablet Take 1 tablet (50 mg total) by mouth daily with breakfast for 5 days. 07/14/20 07/19/20 Yes [name] C, PA-C  albuterol (PROVENTIL) (2.5 MG/3ML) 0.083% nebulizer solution Take 3 mLs (2.5 mg total) by nebulization every 6 (six) hours as needed for wheezing or shortness of breath. 05/15/20   [name], NP  albuterol (VENTOLIN HFA) 108 (90 Base) MCG/ACT inhaler Inhale 1-2 puffs into the lungs every 6 (six) hours as needed for wheezing or shortness of breath. 05/05/20   [name] C, PA-C  ferrous sulfate 325 (65 FE) MG tablet Take 325 mg by mouth daily with breakfast.    [provider]  fluticasone (FLOVENT HFA) 44 MCG/ACT inhaler Inhale 1 puff into the lungs 2 (two) times daily. 06/26/20   [name], MD  hydrochlorothiazide (HYDRODIURIL) 25 MG tablet Take 1 tablet (25 mg total) by mouth daily. 06/26/20   [name], MD  ibuprofen (ADVIL) 800 MG tablet Take 1 tablet (800 mg total) by mouth every 8 (eight) hours as needed for moderate pain. 12/18/19   [name], NP  montelukast (SINGULAIR) 10 MG tablet Take 1 tablet (10 mg total) by mouth at bedtime. 06/26/20   [name], MD  Multiple Vitamin (MULTIVITAMIN) tablet Take 1 tablet by mouth daily.    [provider]  potassium chloride SA (KLOR-CON) 20 MEQ tablet Take 1 tablet (20 mEq total) by mouth daily. 07/05/20   [name], MD   valACYclovir (VALTREX) 500 MG tablet TAKE 1 TABLET BY MOUTH ONCE DAILY INCREASE  TO  TWICE  DAILY  FOR  3  DAYS  DURING  OUTBREAK 06/26/20   [name], MD    Family History Family History  Problem Relation Age of Onset  . Hypertension Mother   . Stroke Mother   . Stroke Maternal Grandmother   . Heart disease Maternal Grandfather 60  . Hypertension Brother   . Diabetes Brother   . Stomach cancer Brother   . Hypertension Brother   . Diabetes Brother     Social History Social History   Tobacco Use  . Smoking status: Former Smoker    Packs/day: 0.25    Years: 17.00    Pack years: 4.25    Types: Cigarettes    Quit date: 09/11/2019    Years since quitting: 0.8  . Smokeless tobacco: Never Used  Vaping Use  . Vaping Use: Never used  Substance Use Topics  . Alcohol use: Yes    Comment: socially  . Drug use: No     Allergies   Penicillins   Review of Systems Review of Systems  Constitutional: Positive for chills and fatigue. Negative for activity change, appetite change and fever.  HENT: Positive for congestion and rhinorrhea. Negative for ear pain, sinus pressure, sore throat and trouble swallowing.   Eyes: Negative for discharge and redness.  Respiratory: Positive for cough, shortness of breath and wheezing. Negative for chest tightness.   Cardiovascular: Negative for chest pain.  Gastrointestinal: Negative for abdominal pain, diarrhea, nausea and vomiting.  Musculoskeletal: Negative for myalgias.  Skin: Negative for rash.  Neurological: Positive for dizziness and headaches. Negative for light-headedness.     Physical Exam Triage Vital Signs ED Triage Vitals  Enc Vitals Group     BP 07/14/20 1943 (!) 143/93     Pulse Rate 07/14/20 1943 84     Resp 07/14/20 1943 18     Temp 07/14/20 1943 98.3 F (36.8 C)     Temp Source 07/14/20 1943 Oral     SpO2 07/14/20 1943 97 %     Weight --      Height --      Head Circumference --      Peak Flow --       Pain Score 07/14/20 1944 6     Pain Loc --      Pain Edu? --      Excl. in [place]? --    No data found.  Updated Vital Signs BP (!) 143/93 (BP Location: Left Arm)   Pulse 84   Temp 98.3 F (36.8 C) (Oral)   Resp 18  LMP 06/13/2020   SpO2 97%   Visual Acuity Right Eye Distance:   Left Eye Distance:   Bilateral Distance:    Right Eye Near:   Left Eye Near:    Bilateral Near:     Physical Exam Vitals and nursing note reviewed.  Constitutional:      Appearance: She is well-developed and well-nourished.     Comments: No acute distress  HENT:     Head: Normocephalic and atraumatic.     Ears:     Comments: Bilateral ears without tenderness to palpation of external auricle, tragus and mastoid, EAC's without erythema or swelling, TM's with good bony landmarks and cone of light. Non erythematous.     Nose: Nose normal.     Mouth/Throat:     Comments: Oral mucosa pink and moist, no tonsillar enlargement or exudate. Posterior pharynx patent and nonerythematous, no uvula deviation or swelling. Normal phonation. Eyes:     Conjunctiva/sclera: Conjunctivae normal.  Cardiovascular:     Rate and Rhythm: Normal rate and regular rhythm.  Pulmonary:     Effort: Pulmonary effort is normal. No respiratory distress.     Comments: Faint end expiratory wheezing noted in bilateral lower lung fields Abdominal:     General: There is no distension.  Musculoskeletal:        General: Normal range of motion.     Cervical back: Neck supple.  Skin:    General: Skin is warm and dry.  Neurological:     Mental Status: She is alert and oriented to person, place, and time.  Psychiatric:        Mood and Affect: Mood and affect normal.      UC Treatments / Results  Labs (all labs ordered are listed, but only abnormal results are displayed) Labs Reviewed  COVID-19, FLU A+B NAA    EKG   Radiology No results found.  Procedures Procedures (including critical care time)  Medications Ordered  in UC Medications - No data to display  Initial Impression / Assessment and Plan / UC Course  I have reviewed the triage vital signs and the nursing notes.  Pertinent labs & imaging results that were available during my care of the patient were reviewed by me and considered in my medical decision making (see chart for details).     Viral URI with cough, treating for bronchitis as well given wheezing, vital signs stable, 2 days of symptoms, Covid/flu test pending.  Opted to defer chest x-ray through shared decision-making with patient given she has had 3 chest x-rays in the past 3 months.  Lower suspicion of pneumonia at this time, but continue to monitor.  Treating symptomatically and supportively with Tessalon, Mucinex, prednisone as well is continuing albuterol inhalers and nebulizers which she has at home.  Discussed strict return precautions. Patient verbalized understanding and is agreeable with plan.  Final Clinical Impressions(s) / UC Diagnoses   Final diagnoses:  Encounter for screening laboratory testing for COVID-19 virus  Acute bronchitis, unspecified organism     Discharge Instructions     Covid/flu test pending-monitor MyChart for results Begin prednisone daily for the next 5 days-take with food and in the morning if you are able Tessalon/benzonatate every 8 hours for cough Please use with Mucinex DM twice daily Continue albuterol inhaler/nebulizers as needed Follow-up if not improving or worsening   ED Prescriptions    Medication Sig Dispense Auth. Provider   predniSONE (DELTASONE) 50 MG tablet Take 1 tablet (50 mg total) by mouth  daily with breakfast for 5 days. 5 tablet Libbie Bartley C, PA-C   benzonatate (TESSALON) 100 MG capsule Take 2 capsules (200 mg total) by mouth every 8 (eight) hours. 30 capsule Graeden Bitner, [place] C, PA-C     PDMP not reviewed this encounter.   Janith Lima, [place] 07/15/20 [id number]

## 2020-08-11 ENCOUNTER — Ambulatory Visit
Admission: EM | Admit: 2020-08-11 | Discharge: 2020-08-11 | Disposition: A | Discharge status: Home / Self Care | Payer: 59 | Attending: Emergency Medicine | Admitting: Emergency Medicine

## 2020-08-11 ENCOUNTER — Other Ambulatory Visit: Payer: Self-pay

## 2020-08-11 DIAGNOSIS — R197 Diarrhea, unspecified: Secondary | ICD-10-CM | POA: Diagnosis not present

## 2020-08-11 DIAGNOSIS — R1013 Epigastric pain: Secondary | ICD-10-CM

## 2020-08-11 DIAGNOSIS — R112 Nausea with vomiting, unspecified: Secondary | ICD-10-CM

## 2020-08-11 MED ORDER — ONDANSETRON 4 MG PO TBDP: 4.0000 mg | ORAL_TABLET | Freq: Three times a day (TID) | ORAL | 0 refills | Status: DC | PRN

## 2020-08-11 MED ORDER — FAMOTIDINE 20 MG PO TABS: 20.0000 mg | ORAL_TABLET | Freq: Two times a day (BID) | ORAL | 0 refills | Status: DC | PRN

## 2020-08-11 MED ORDER — OMEPRAZOLE 20 MG PO CPDR: 20.0000 mg | DELAYED_RELEASE_CAPSULE | Freq: Two times a day (BID) | ORAL | 0 refills | Status: DC

## 2020-08-11 MED ORDER — ALUM & MAG HYDROXIDE-SIMETH 200-200-20 MG/5ML PO SUSP: 15.0000 mL | Freq: Four times a day (QID) | ORAL | 0 refills | Status: DC | PRN

## 2020-08-11 NOTE — ED Provider Notes (Signed)
[name]    CSN: [id number] Arrival date & time: 08/11/20  6789      History   Chief Complaint Chief Complaint  Patient presents with  . Abdominal Pain    HPI Robin Fox is a 45 y.o. female history of hypertension, presenting today for evaluation of abdominal pain.  Reports that she has had abdominal pain nausea vomiting and diarrhea.  Symptoms began 3 days ago.  Symptoms largely in the upper abdomen.  Reports associated nausea and vomiting, denies hematemesis.  Discomfort does radiate into chest.  Worse with lying flat.  Denies history of underlying abdominal/GI problems.  Denies prior abdominal surgeries.  Has also had some cramping and uneasiness in her lower abdomen with associated frequent loose stools.  Denies any URI symptoms.  Denies fevers.  Has had some occasional sweats.  HPI  Past Medical History:  Diagnosis Date  . Anemia    hx   . Gestational diabetes    Neg 2 hr test after preg  . Headache(784.0)   . Herpes genitalia    2013 - 1st outbreak  . Hypertension    2008  . Pre-eclampsia in third trimester   . Seasonal allergies   . Shortness of breath    w exertion  . Trichomonas   . Twins     Patient Active Problem List   Diagnosis Date Noted  . Screening breast examination 03/30/2019  . Breast pain 03/30/2019  . Tobacco use 08/01/2016  . Routine postpartum follow-up 05/22/2012  . Mild or unspecified pre-eclampsia, with delivery 04/07/2012  . Abnormal maternal glucose tolerance, antepartum 03/27/2012  . History of IUGR (intrauterine growth retardation) and stillbirth, currently pregnant 02/12/2012  . History of stillbirth 12/12/2011  . Previous cesarean delivery, antepartum condition or complication 38/02/1750  . Obesity (BMI 30-39.9) 11/21/2011  . Hx of herpes genitalis 11/21/2011  . Hypertension in pregnancy, essential, antepartum 11/09/2011  . Essential hypertension 11/09/2011  . AMA (advanced maternal age) multigravida  35+ 11/09/2011    Past Surgical History:  Procedure Laterality Date  . CESAREAN SECTION     X 2  . CESAREAN SECTION  04/21/2012   Procedure: CESAREAN SECTION;  Surgeon: Woodroe Mode, MD;  Location: [facility];  Service: Obstetrics;  Laterality: N/A;  . TOOTH EXTRACTION      OB History    Gravida  4   Para  3   Term  1   Preterm  2   AB  0   Living  4     SAB  0   IAB  0   Ectopic  0   Multiple  2   Live Births  4            Home Medications    Prior to Admission medications   Medication Sig Start Date End Date Taking? Authorizing Provider  alum & mag hydroxide-simeth (MAALOX/MYLANTA) 200-200-20 MG/5ML suspension Take 15 mLs by mouth every 6 (six) hours as needed for indigestion or heartburn. 08/11/20  Yes Xadrian Craighead C, PA-C  famotidine (PEPCID) 20 MG tablet Take 1 tablet (20 mg total) by mouth 2 (two) times daily as needed for heartburn or indigestion. 08/11/20  Yes Khole Arterburn C, PA-C  omeprazole (PRILOSEC) 20 MG capsule Take 1 capsule (20 mg total) by mouth 2 (two) times daily before a meal for 15 days. 08/11/20 08/26/20 Yes Mane Consolo C, PA-C  ondansetron (ZOFRAN ODT) 4 MG disintegrating tablet Take 1 tablet (4 mg total) by mouth every  8 (eight) hours as needed for nausea or vomiting. 08/11/20  Yes Dewel Lotter C, PA-C  albuterol (PROVENTIL) (2.5 MG/3ML) 0.083% nebulizer solution Take 3 mLs (2.5 mg total) by nebulization every 6 (six) hours as needed for wheezing or shortness of breath. 05/15/20   [name], NP  albuterol (VENTOLIN HFA) 108 (90 Base) MCG/ACT inhaler Inhale 1-2 puffs into the lungs every 6 (six) hours as needed for wheezing or shortness of breath. 05/05/20   Patton Rabinovich C, PA-C  benzonatate (TESSALON) 100 MG capsule Take 2 capsules (200 mg total) by mouth every 8 (eight) hours. 07/14/20   Semira Stoltzfus C, PA-C  ferrous sulfate 325 (65 FE) MG tablet Take 325 mg by mouth daily with breakfast.    [provider]   fluticasone (FLOVENT HFA) 44 MCG/ACT inhaler Inhale 1 puff into the lungs 2 (two) times daily. 06/26/20   [name], MD  hydrochlorothiazide (HYDRODIURIL) 25 MG tablet Take 1 tablet (25 mg total) by mouth daily. 06/26/20   [name], MD  ibuprofen (ADVIL) 800 MG tablet Take 1 tablet (800 mg total) by mouth every 8 (eight) hours as needed for moderate pain. 12/18/19   [name], NP  montelukast (SINGULAIR) 10 MG tablet Take 1 tablet (10 mg total) by mouth at bedtime. 06/26/20   [name], MD  Multiple Vitamin (MULTIVITAMIN) tablet Take 1 tablet by mouth daily.    [provider]  potassium chloride SA (KLOR-CON) 20 MEQ tablet Take 1 tablet (20 mEq total) by mouth daily. 07/05/20   [name], MD  valACYclovir (VALTREX) 500 MG tablet TAKE 1 TABLET BY MOUTH ONCE DAILY INCREASE  TO  TWICE  DAILY  FOR  3  DAYS  DURING  OUTBREAK 06/26/20   [name], MD    Family History Family History  Problem Relation Age of Onset  . Hypertension Mother   . Stroke Mother   . Stroke Maternal Grandmother   . Heart disease Maternal Grandfather 60  . Hypertension Brother   . Diabetes Brother   . Stomach cancer Brother   . Hypertension Brother   . Diabetes Brother     Social History Social History   Tobacco Use  . Smoking status: Former Smoker    Packs/day: 0.25    Years: 17.00    Pack years: 4.25    Types: Cigarettes    Quit date: 09/11/2019    Years since quitting: 0.9  . Smokeless tobacco: Never Used  Vaping Use  . Vaping Use: Never used  Substance Use Topics  . Alcohol use: Yes    Comment: socially  . Drug use: No     Allergies   Penicillins   Review of Systems Review of Systems  Constitutional: Negative for activity change, appetite change, chills, fatigue and fever.  HENT: Negative for congestion, ear pain, rhinorrhea, sinus pressure, sore throat and trouble swallowing.   Eyes: Negative for discharge and redness.  Respiratory:  Negative for cough, chest tightness and shortness of breath.   Cardiovascular: Negative for chest pain.  Gastrointestinal: Positive for abdominal pain, diarrhea, nausea and vomiting.  Musculoskeletal: Negative for myalgias.  Skin: Negative for rash.  Neurological: Negative for dizziness, light-headedness and headaches.     Physical Exam Triage Vital Signs ED Triage Vitals [08/11/20 0915]  Enc Vitals Group     BP 127/87     Pulse Rate 90     Resp 16     Temp 98.2 F (36.8 C)  Temp Source Oral     SpO2 97 %     Weight      Height      Head Circumference      Peak Flow      Pain Score      Pain Loc      Pain Edu?      Excl. in [place]?    No data found.  Updated Vital Signs BP 127/87 (BP Location: Left Arm)   Pulse 90   Temp 98.2 F (36.8 C) (Oral)   Resp 16   LMP 07/24/2020 (LMP Unknown)   SpO2 97%   Visual Acuity Right Eye Distance:   Left Eye Distance:   Bilateral Distance:    Right Eye Near:   Left Eye Near:    Bilateral Near:     Physical Exam Vitals and nursing note reviewed.  Constitutional:      Appearance: She is well-developed.     Comments: No acute distress  HENT:     Head: Normocephalic and atraumatic.     Nose: Nose normal.     Mouth/Throat:     Comments: Oral mucosa pink and moist, no tonsillar enlargement or exudate. Posterior pharynx patent and nonerythematous, no uvula deviation or swelling. Normal phonation. Eyes:     Conjunctiva/sclera: Conjunctivae normal.  Cardiovascular:     Rate and Rhythm: Normal rate and regular rhythm.  Pulmonary:     Effort: Pulmonary effort is normal. No respiratory distress.     Comments: Breathing comfortably at rest, CTABL, no wheezing, rales or other adventitious sounds auscultated Abdominal:     General: There is no distension.     Comments: Soft, nondistended, tender to palpation to epigastrium, mid upper abdomen and left upper quadrant, nontender to palpation to bilateral lower quadrants, negative  rebound, negative Murphy's, negative McBurney's, negative Rovsing  Musculoskeletal:        General: Normal range of motion.     Cervical back: Neck supple.  Skin:    General: Skin is warm and dry.  Neurological:     Mental Status: She is alert and oriented to person, place, and time.      UC Treatments / Results  Labs (all labs ordered are listed, but only abnormal results are displayed) Labs Reviewed - No data to display  EKG   Radiology No results found.  Procedures Procedures (including critical care time)  Medications Ordered in UC Medications - No data to display  Initial Impression / Assessment and Plan / UC Course  I have reviewed the triage vital signs and the nursing notes.  Pertinent labs & imaging results that were available during my care of the patient were reviewed by me and considered in my medical decision making (see chart for details).     Gastritis/GERD versus gastroenteritis given associated diarrhea, recommending symptomatic and supportive care; providing Zofran for nausea/vomiting, initiating on omeprazole and encouraging Pepcid/Maalox to use as needed for indigestion relief.  Rest and push fluids.  Monitor for gradual resolution.  Low suspicion of underlying abdominal emergency at this time, negative peritoneal signs.  Continue to monitor,Discussed strict return precautions. Patient verbalized understanding and is agreeable with plan.  Final Clinical Impressions(s) / UC Diagnoses   Final diagnoses:  Abdominal pain, epigastric  Nausea vomiting and diarrhea     Discharge Instructions     Please use Zofran-dissolved in mouth as needed for nausea/vomiting May use Maalox or Pepcid as needed for further relief of indigestion Omeprazole twice daily with meals  for the next 2 weeks to further subside and prevent underlying acid Drink plenty of fluids Follow-up if not improving or worsening    ED Prescriptions    Medication Sig Dispense Auth.  Provider   alum & mag hydroxide-simeth (MAALOX/MYLANTA) 200-200-20 MG/5ML suspension Take 15 mLs by mouth every 6 (six) hours as needed for indigestion or heartburn. 355 mL Qianna Clagett C, PA-C   omeprazole (PRILOSEC) 20 MG capsule Take 1 capsule (20 mg total) by mouth 2 (two) times daily before a meal for 15 days. 30 capsule Tylon Kemmerling C, PA-C   ondansetron (ZOFRAN ODT) 4 MG disintegrating tablet Take 1 tablet (4 mg total) by mouth every 8 (eight) hours as needed for nausea or vomiting. 20 tablet Prestyn Mahn C, PA-C   famotidine (PEPCID) 20 MG tablet Take 1 tablet (20 mg total) by mouth 2 (two) times daily as needed for heartburn or indigestion. 30 tablet Stehanie Ekstrom, [place] C, PA-C     PDMP not reviewed this encounter.   Janith Lima, [place] 08/11/20 [phone]

## 2020-08-11 NOTE — ED Triage Notes (Addendum)
Pt present abdominal pain with nausea, vomiting and diarrhea. Symptoms started three days ago. Pt states stomach pain is in the epigastric area.  Pt states since she has been having stomach pain she breaks out in sweets.

## 2020-08-11 NOTE — Discharge Instructions (Signed)
Please use Zofran-dissolved in mouth as needed for nausea/vomiting May use Maalox or Pepcid as needed for further relief of indigestion Omeprazole twice daily with meals for the next 2 weeks to further subside and prevent underlying acid Drink plenty of fluids Follow-up if not improving or worsening

## 2020-08-13 ENCOUNTER — Emergency Department (HOSPITAL_BASED_OUTPATIENT_CLINIC_OR_DEPARTMENT_OTHER): Payer: 59

## 2020-08-13 ENCOUNTER — Other Ambulatory Visit: Payer: Self-pay

## 2020-08-13 ENCOUNTER — Encounter (HOSPITAL_BASED_OUTPATIENT_CLINIC_OR_DEPARTMENT_OTHER): Payer: Self-pay | Admitting: Emergency Medicine

## 2020-08-13 ENCOUNTER — Inpatient Hospital Stay (HOSPITAL_BASED_OUTPATIENT_CLINIC_OR_DEPARTMENT_OTHER)
Admission: EM | Admit: 2020-08-13 | Discharge: 2020-08-16 | DRG: 440 | Disposition: A | Discharge status: Home / Self Care | Payer: 59 | Attending: Internal Medicine | Admitting: Internal Medicine

## 2020-08-13 DIAGNOSIS — J45909 Unspecified asthma, uncomplicated: Secondary | ICD-10-CM

## 2020-08-13 DIAGNOSIS — A6009 Herpesviral infection of other urogenital tract: Secondary | ICD-10-CM | POA: Diagnosis present

## 2020-08-13 DIAGNOSIS — E876 Hypokalemia: Secondary | ICD-10-CM | POA: Diagnosis present

## 2020-08-13 DIAGNOSIS — Z823 Family history of stroke: Secondary | ICD-10-CM

## 2020-08-13 DIAGNOSIS — E669 Obesity, unspecified: Secondary | ICD-10-CM

## 2020-08-13 DIAGNOSIS — Z833 Family history of diabetes mellitus: Secondary | ICD-10-CM

## 2020-08-13 DIAGNOSIS — Z87891 Personal history of nicotine dependence: Secondary | ICD-10-CM

## 2020-08-13 DIAGNOSIS — K8500 Idiopathic acute pancreatitis without necrosis or infection: Secondary | ICD-10-CM | POA: Diagnosis not present

## 2020-08-13 DIAGNOSIS — K219 Gastro-esophageal reflux disease without esophagitis: Secondary | ICD-10-CM | POA: Diagnosis present

## 2020-08-13 DIAGNOSIS — Z20822 Contact with and (suspected) exposure to covid-19: Secondary | ICD-10-CM | POA: Diagnosis present

## 2020-08-13 DIAGNOSIS — Z800 Family history of malignant neoplasm of digestive organs: Secondary | ICD-10-CM

## 2020-08-13 DIAGNOSIS — Z880 Allergy status to penicillin: Secondary | ICD-10-CM

## 2020-08-13 DIAGNOSIS — Z8249 Family history of ischemic heart disease and other diseases of the circulatory system: Secondary | ICD-10-CM

## 2020-08-13 DIAGNOSIS — R1011 Right upper quadrant pain: Secondary | ICD-10-CM

## 2020-08-13 DIAGNOSIS — K859 Acute pancreatitis without necrosis or infection, unspecified: Secondary | ICD-10-CM | POA: Diagnosis present

## 2020-08-13 DIAGNOSIS — Z6838 Body mass index (BMI) 38.0-38.9, adult: Secondary | ICD-10-CM

## 2020-08-13 DIAGNOSIS — I10 Essential (primary) hypertension: Secondary | ICD-10-CM | POA: Diagnosis present

## 2020-08-13 LAB — CBC
HCT: 40.2 % (ref 36.0–46.0)
Hemoglobin: 12.7 g/dL (ref 12.0–15.0)
MCH: 22.3 pg — ABNORMAL LOW (ref 26.0–34.0)
MCHC: 31.6 g/dL (ref 30.0–36.0)
MCV: 70.5 fL — ABNORMAL LOW (ref 80.0–100.0)
Platelets: 392 10*3/uL (ref 150–400)
RBC: 5.7 MIL/uL — ABNORMAL HIGH (ref 3.87–5.11)
RDW: 16.3 % — ABNORMAL HIGH (ref 11.5–15.5)
WBC: 7.7 10*3/uL (ref 4.0–10.5)
nRBC: 0 % (ref 0.0–0.2)

## 2020-08-13 LAB — COMPREHENSIVE METABOLIC PANEL
ALT: 39 U/L (ref 0–44)
AST: 37 U/L (ref 15–41)
Albumin: 3.8 g/dL (ref 3.5–5.0)
Alkaline Phosphatase: 98 U/L (ref 38–126)
Anion gap: 10 (ref 5–15)
BUN: 13 mg/dL (ref 6–20)
CO2: 27 mmol/L (ref 22–32)
Calcium: 8.8 mg/dL — ABNORMAL LOW (ref 8.9–10.3)
Chloride: 99 mmol/L (ref 98–111)
Creatinine, Ser: 0.89 mg/dL (ref 0.44–1.00)
GFR, Estimated: >60 mL/min (ref >60)
Glucose, Bld: 123 mg/dL — ABNORMAL HIGH (ref 70–99)
Potassium: 3.5 mmol/L (ref 3.5–5.1)
Sodium: 136 mmol/L (ref 135–145)
Total Bilirubin: 0.3 mg/dL (ref 0.3–1.2)
Total Protein: 7.6 g/dL (ref 6.5–8.1)

## 2020-08-13 LAB — URINALYSIS, ROUTINE W REFLEX MICROSCOPIC
Bilirubin Urine: NEGATIVE
Glucose, UA: NEGATIVE mg/dL
Ketones, ur: NEGATIVE mg/dL
Leukocytes,Ua: NEGATIVE
Nitrite: NEGATIVE
Protein, ur: NEGATIVE mg/dL
Specific Gravity, Urine: 1.025 (ref 1.005–1.030)
pH: 6 (ref 5.0–8.0)

## 2020-08-13 LAB — URINALYSIS, MICROSCOPIC (REFLEX)

## 2020-08-13 LAB — PREGNANCY, URINE: Preg Test, Ur: NEGATIVE

## 2020-08-13 LAB — LIPASE, BLOOD: Lipase: 1029 U/L — ABNORMAL HIGH (ref 11–51)

## 2020-08-13 MED ORDER — MORPHINE SULFATE (PF) 4 MG/ML IV SOLN
4.0000 mg | Freq: Once | INTRAVENOUS | Status: AC
  Administered 2020-08-13: 4 mg via INTRAVENOUS
  Filled 2020-08-13: qty 1

## 2020-08-13 MED ORDER — SODIUM CHLORIDE 0.9 % IV BOLUS
1000.0000 mL | Freq: Once | INTRAVENOUS | Status: AC
  Administered 2020-08-13: 1000 mL via INTRAVENOUS

## 2020-08-13 MED ORDER — ONDANSETRON HCL 4 MG/2ML IJ SOLN
4.0000 mg | Freq: Once | INTRAMUSCULAR | Status: AC
  Administered 2020-08-13: 4 mg via INTRAVENOUS
  Filled 2020-08-13: qty 2

## 2020-08-13 MED ORDER — IOHEXOL 300 MG/ML  SOLN
100.0000 mL | Freq: Once | INTRAMUSCULAR | Status: AC
  Administered 2020-08-13: 100 mL via INTRAVENOUS

## 2020-08-13 MED ORDER — HYDROMORPHONE HCL 1 MG/ML IJ SOLN
1.0000 mg | Freq: Once | INTRAMUSCULAR | Status: AC
  Administered 2020-08-13: 1 mg via INTRAVENOUS
  Filled 2020-08-13: qty 1

## 2020-08-13 MED ORDER — MORPHINE SULFATE (PF) 2 MG/ML IV SOLN
2.0000 mg | Freq: Once | INTRAVENOUS | Status: AC
Start: 2020-08-13 — End: 2020-08-13
  Administered 2020-08-13: 2 mg via INTRAVENOUS
  Filled 2020-08-13: qty 1

## 2020-08-13 NOTE — ED Triage Notes (Signed)
Pt arrives pov with c/o epigastric pain x 1 week. Pt reports radiation to back. Pt denies N/V today, resolved from last week. Endorses loss of appetite

## 2020-08-13 NOTE — ED Notes (Signed)
Patient transported to Ultrasound 

## 2020-08-13 NOTE — ED Notes (Signed)
Patient transported to CT 

## 2020-08-13 NOTE — ED Provider Notes (Signed)
[facility] Provider Note   CSN: [id number] Arrival date & time: 08/13/20  1807     History Chief Complaint  Patient presents with  . Abdominal Pain    Robin Fox is a 45 y.o. female past medical history of hypertension, presenting to the emergency department for evaluation of abdominal pain.  She states symptoms began about 1 week ago.  Pain favors the right upper/epigastric abdomen, comes and goes, is worse after meals, is sharp in nature.  Sometimes it radiates to her low back.  Earlier in the week she was having nausea vomiting and diarrhea.  That has resolved.  Now she just feels queasy and still has quite a bit of pain.  She was evaluated at urgent care on 08/11/2020.  She was prescribed antacids and Zofran, thought to be viral versus gastritis.  She states the antacids have not provided her relief, the Zofran does work.  Symptoms in her belly are not resolving therefore she presents for evaluation.  Denies fever, chills.  Does have some urinary frequency though no dysuria.  Is tolerating fluids.  No history of abdominal surgery. Reports ibuprofen use during menstrual period and intermittently for HA, but not regular. Reports history of heavy drinking but now rarely uses, on average <1 times per week.   The history is provided by the patient and medical records.       Past Medical History:  Diagnosis Date  . Anemia    hx   . Gestational diabetes    Neg 2 hr test after preg  . Headache(784.0)   . Herpes genitalia    2013 - 1st outbreak  . Hypertension    2008  . Pre-eclampsia in third trimester   . Seasonal allergies   . Shortness of breath    w exertion  . Trichomonas   . Twins     Patient Active Problem List   Diagnosis Date Noted  . Pancreatitis 08/13/2020  . Screening breast examination 03/30/2019  . Breast pain 03/30/2019  . Tobacco use 08/01/2016  . Routine postpartum follow-up 05/22/2012  . Mild or unspecified  pre-eclampsia, with delivery 04/07/2012  . Abnormal maternal glucose tolerance, antepartum 03/27/2012  . History of IUGR (intrauterine growth retardation) and stillbirth, currently pregnant 02/12/2012  . History of stillbirth 12/12/2011  . Previous cesarean delivery, antepartum condition or complication 87/86/7672  . Obesity (BMI 30-39.9) 11/21/2011  . Hx of herpes genitalis 11/21/2011  . Hypertension in pregnancy, essential, antepartum 11/09/2011  . Essential hypertension 11/09/2011  . AMA (advanced maternal age) multigravida 35+ 11/09/2011    Past Surgical History:  Procedure Laterality Date  . CESAREAN SECTION     X 2  . CESAREAN SECTION  04/21/2012   Procedure: CESAREAN SECTION;  Surgeon: Woodroe Mode, MD;  Location: [facility];  Service: Obstetrics;  Laterality: N/A;  . TOOTH EXTRACTION       OB History    Gravida  4   Para  3   Term  1   Preterm  2   AB  0   Living  4     SAB  0   IAB  0   Ectopic  0   Multiple  2   Live Births  4           Family History  Problem Relation Age of Onset  . Hypertension Mother   . Stroke Mother   . Stroke Maternal Grandmother   . Heart disease Maternal Grandfather 60  .  Hypertension Brother   . Diabetes Brother   . Stomach cancer Brother   . Hypertension Brother   . Diabetes Brother     Social History   Tobacco Use  . Smoking status: Former Smoker    Packs/day: 0.25    Years: 17.00    Pack years: 4.25    Types: Cigarettes    Quit date: 09/11/2019    Years since quitting: 0.9  . Smokeless tobacco: Never Used  Vaping Use  . Vaping Use: Never used  Substance Use Topics  . Alcohol use: Yes    Comment: socially  . Drug use: No    Home Medications Prior to Admission medications   Medication Sig Start Date End Date Taking? Authorizing Provider  albuterol (PROVENTIL) (2.5 MG/3ML) 0.083% nebulizer solution Take 3 mLs (2.5 mg total) by nebulization every 6 (six) hours as needed for wheezing or shortness of  breath. 05/15/20   [name], NP  albuterol (VENTOLIN HFA) 108 (90 Base) MCG/ACT inhaler Inhale 1-2 puffs into the lungs every 6 (six) hours as needed for wheezing or shortness of breath. 05/05/20   Wieters, Hallie C, PA-C  alum & mag hydroxide-simeth (MAALOX/MYLANTA) 200-200-20 MG/5ML suspension Take 15 mLs by mouth every 6 (six) hours as needed for indigestion or heartburn. 08/11/20   Wieters, Hallie C, PA-C  benzonatate (TESSALON) 100 MG capsule Take 2 capsules (200 mg total) by mouth every 8 (eight) hours. 07/14/20   Wieters, Hallie C, PA-C  famotidine (PEPCID) 20 MG tablet Take 1 tablet (20 mg total) by mouth 2 (two) times daily as needed for heartburn or indigestion. 08/11/20   Wieters, Hallie C, PA-C  ferrous sulfate 325 (65 FE) MG tablet Take 325 mg by mouth daily with breakfast.    [provider]  fluticasone (FLOVENT HFA) 44 MCG/ACT inhaler Inhale 1 puff into the lungs 2 (two) times daily. 06/26/20   [name], MD  hydrochlorothiazide (HYDRODIURIL) 25 MG tablet Take 1 tablet (25 mg total) by mouth daily. 06/26/20   [name], MD  ibuprofen (ADVIL) 800 MG tablet Take 1 tablet (800 mg total) by mouth every 8 (eight) hours as needed for moderate pain. 12/18/19   [name], NP  montelukast (SINGULAIR) 10 MG tablet Take 1 tablet (10 mg total) by mouth at bedtime. 06/26/20   [name], MD  Multiple Vitamin (MULTIVITAMIN) tablet Take 1 tablet by mouth daily.    [provider]  omeprazole (PRILOSEC) 20 MG capsule Take 1 capsule (20 mg total) by mouth 2 (two) times daily before a meal for 15 days. 08/11/20 08/26/20  Wieters, Hallie C, PA-C  ondansetron (ZOFRAN ODT) 4 MG disintegrating tablet Take 1 tablet (4 mg total) by mouth every 8 (eight) hours as needed for nausea or vomiting. 08/11/20   Wieters, Hallie C, PA-C  potassium chloride SA (KLOR-CON) 20 MEQ tablet Take 1 tablet (20 mEq total) by mouth daily. 07/05/20   [name], MD  valACYclovir  (VALTREX) 500 MG tablet TAKE 1 TABLET BY MOUTH ONCE DAILY INCREASE  TO  TWICE  DAILY  FOR  3  DAYS  DURING  OUTBREAK 06/26/20   [name], MD    Allergies    Penicillins  Review of Systems   Review of Systems  Constitutional: Negative for fever.  Gastrointestinal: Positive for abdominal pain.  Genitourinary: Positive for frequency.  All other systems reviewed and are negative.   Physical Exam Updated Vital Signs BP 134/79   Pulse 83  Temp 99.2 F (37.3 C) (Oral)   Resp 20   Ht 5\' 7"  (1.702 m)   Wt 104.3 kg   LMP 07/24/2020 (LMP Unknown)   SpO2 99%   BMI 36.02 kg/m   Physical Exam Vitals and nursing note reviewed.  Constitutional:      General: She is not in acute distress.    Appearance: She is well-developed. She is not ill-appearing.  HENT:     Head: Normocephalic and atraumatic.  Eyes:     Conjunctiva/sclera: Conjunctivae normal.  Cardiovascular:     Rate and Rhythm: Normal rate and regular rhythm.  Pulmonary:     Effort: Pulmonary effort is normal. No respiratory distress.     Breath sounds: Normal breath sounds.  Abdominal:     General: Abdomen is flat. Bowel sounds are normal.     Palpations: Abdomen is soft.     Tenderness: There is abdominal tenderness in the right upper quadrant, periumbilical area and suprapubic area. There is no guarding or rebound.  Skin:    General: Skin is warm.  Neurological:     Mental Status: She is alert.  Psychiatric:        Behavior: Behavior normal.     ED Results / Procedures / Treatments   Labs (all labs ordered are listed, but only abnormal results are displayed) Labs Reviewed  LIPASE, BLOOD - Abnormal; Notable for the following components:      Result Value   Lipase 1,029 (*)    All other components within normal limits  COMPREHENSIVE METABOLIC PANEL - Abnormal; Notable for the following components:   Glucose, Bld 123 (*)    Calcium 8.8 (*)    All other components within normal limits  CBC - Abnormal;  Notable for the following components:   RBC 5.70 (*)    MCV 70.5 (*)    MCH 22.3 (*)    RDW 16.3 (*)    All other components within normal limits  URINALYSIS, ROUTINE W REFLEX MICROSCOPIC - Abnormal; Notable for the following components:   Hgb urine dipstick LARGE (*)    All other components within normal limits  URINALYSIS, MICROSCOPIC (REFLEX) - Abnormal; Notable for the following components:   Bacteria, UA FEW (*)    All other components within normal limits  RESP PANEL BY RT-PCR (FLU A&B, COVID) ARPGX2  PREGNANCY, URINE  TRIGLYCERIDES    EKG None  Radiology CT Abdomen Pelvis W Contrast  Result Date: 08/13/2020 CLINICAL DATA:  Epigastric pain EXAM: CT ABDOMEN AND PELVIS WITH CONTRAST TECHNIQUE: Multidetector CT imaging of the abdomen and pelvis was performed using the standard protocol following bolus administration of intravenous contrast. CONTRAST:  173mL OMNIPAQUE IOHEXOL 300 MG/ML  SOLN COMPARISON:  Ultrasound 08/13/2020 FINDINGS: Lower chest: Lung bases are clear. No effusions. Heart is normal size. Hepatobiliary: No focal hepatic abnormality. Gallbladder unremarkable. Pancreas: No focal abnormality or ductal dilatation. Spleen: No focal abnormality.  Normal size. Adrenals/Urinary Tract: No adrenal abnormality. No focal renal abnormality. No stones or hydronephrosis. Urinary bladder is unremarkable. Stomach/Bowel: Stomach, large and small bowel grossly unremarkable. Vascular/Lymphatic: No evidence of aneurysm or adenopathy. Reproductive: Uterus and adnexa unremarkable.  No mass. Other: No free fluid or free air. Musculoskeletal: No acute bony abnormality. IMPRESSION: No acute findings in the abdomen or pelvis. Electronically Signed   By: Rolm Baptise M.D.   On: 08/13/2020 21:39   US Abdomen Limited RUQ (LIVER/GB)  Result Date: 08/13/2020 CLINICAL DATA:  45 year old female with right upper quadrant abdominal pain. EXAM:  ULTRASOUND ABDOMEN LIMITED RIGHT UPPER QUADRANT COMPARISON:   None. FINDINGS: Gallbladder: No gallstones or wall thickening visualized. No sonographic Murphy sign noted by sonographer. Common bile duct: Diameter: 2 mm Liver: No focal lesion identified. Within normal limits in parenchymal echogenicity. Portal vein is patent on color Doppler imaging with normal direction of blood flow towards the liver. Other: None. IMPRESSION: Unremarkable right upper quadrant ultrasound. Electronically Signed   By: Anner Crete M.D.   On: 08/13/2020 19:50    Procedures Procedures   Medications Ordered in ED Medications  ondansetron (ZOFRAN) injection 4 mg (4 mg Intravenous Given 08/13/20 1915)  sodium chloride 0.9 % bolus 1,000 mL (0 mLs Intravenous Stopped 08/13/20 2116)  morphine 2 MG/ML injection 2 mg (2 mg Intravenous Given 08/13/20 1915)  morphine 4 MG/ML injection 4 mg (4 mg Intravenous Given 08/13/20 2112)  iohexol (OMNIPAQUE) 300 MG/ML solution 100 mL (100 mLs Intravenous Contrast Given 08/13/20 2118)  HYDROmorphone (DILAUDID) injection 1 mg (1 mg Intravenous Given 08/13/20 2243)    ED Course  I have reviewed the triage vital signs and the nursing notes.  Pertinent labs & imaging results that were available during my care of the patient were reviewed by me and considered in my medical decision making (see chart for details).  Clinical Course as of 08/13/20 2340  Sun Aug 13, 2020  2235 Patient continues to have persistent pain not better with multiple doses of narcotic pain medication.  Unclear source of pancreatitis.  Discussed admission.  Will admit patient to the hospital for further management. [JR]  2310 Dr. Josephine Cables accepting admission [JR]    Clinical Course User Index [JR] Robinson, [place] N, PA-C   MDM Rules/Calculators/A&P                          Patient has presented emergency department for nearly 1 week of persisting upper abdominal pain.  Initially had nausea vomiting and diarrhea, though symptoms have improved.  Now just feels nauseated,  severely worsening pain with eating. Pain radiates to her back. No recent alcohol use, intermittent NSAID use, sx didn't improve with antacids. Very tender in the upper abdomen, afebrile.   Initial concern for gallbladder pathology, symptomatic management was ordered, blood work, right upper quadrant ultrasound.  Ultrasound however is negative.  Lipase comes back at thousand.  No leukocytosis or significant electrolyte derangement.  Patient pain persists, pain medication is reduced.  Nausea is improved.  CT scan ordered and is negative.  Pain persists after multiple doses of narcotic pain medication.  Unclear source of acute pancreatitis.  Triglycerides are sent.   Shared decision making with patient regarding symptom management and unclear source of pancreatitis.  Plan for admission, Dr. Josephine Cables is accepting to [facility]. Final Clinical Impression(s) / ED Diagnoses Final diagnoses:  RUQ abdominal pain  Idiopathic acute pancreatitis without infection or necrosis    Rx / DC Orders ED Discharge Orders    None       Robinson, [place] N, PA-C 08/13/20 2340    Quintella Reichert, MD 08/15/20 [phone]

## 2020-08-14 ENCOUNTER — Other Ambulatory Visit: Payer: Self-pay

## 2020-08-14 ENCOUNTER — Encounter (HOSPITAL_COMMUNITY): Payer: Self-pay | Admitting: Internal Medicine

## 2020-08-14 DIAGNOSIS — Z87891 Personal history of nicotine dependence: Secondary | ICD-10-CM | POA: Diagnosis not present

## 2020-08-14 DIAGNOSIS — I10 Essential (primary) hypertension: Secondary | ICD-10-CM

## 2020-08-14 DIAGNOSIS — K8500 Idiopathic acute pancreatitis without necrosis or infection: Secondary | ICD-10-CM | POA: Diagnosis present

## 2020-08-14 DIAGNOSIS — E876 Hypokalemia: Secondary | ICD-10-CM | POA: Diagnosis present

## 2020-08-14 DIAGNOSIS — J45909 Unspecified asthma, uncomplicated: Secondary | ICD-10-CM

## 2020-08-14 DIAGNOSIS — Z8249 Family history of ischemic heart disease and other diseases of the circulatory system: Secondary | ICD-10-CM | POA: Diagnosis not present

## 2020-08-14 DIAGNOSIS — K219 Gastro-esophageal reflux disease without esophagitis: Secondary | ICD-10-CM | POA: Diagnosis present

## 2020-08-14 DIAGNOSIS — Z800 Family history of malignant neoplasm of digestive organs: Secondary | ICD-10-CM | POA: Diagnosis not present

## 2020-08-14 DIAGNOSIS — Z833 Family history of diabetes mellitus: Secondary | ICD-10-CM | POA: Diagnosis not present

## 2020-08-14 DIAGNOSIS — Z6838 Body mass index (BMI) 38.0-38.9, adult: Secondary | ICD-10-CM | POA: Diagnosis not present

## 2020-08-14 DIAGNOSIS — E669 Obesity, unspecified: Secondary | ICD-10-CM | POA: Diagnosis present

## 2020-08-14 DIAGNOSIS — K859 Acute pancreatitis without necrosis or infection, unspecified: Secondary | ICD-10-CM | POA: Diagnosis present

## 2020-08-14 DIAGNOSIS — Z880 Allergy status to penicillin: Secondary | ICD-10-CM | POA: Diagnosis not present

## 2020-08-14 DIAGNOSIS — Z20822 Contact with and (suspected) exposure to covid-19: Secondary | ICD-10-CM | POA: Diagnosis present

## 2020-08-14 DIAGNOSIS — A6009 Herpesviral infection of other urogenital tract: Secondary | ICD-10-CM | POA: Diagnosis present

## 2020-08-14 DIAGNOSIS — Z823 Family history of stroke: Secondary | ICD-10-CM | POA: Diagnosis not present

## 2020-08-14 LAB — COMPREHENSIVE METABOLIC PANEL
ALT: 31 U/L (ref 0–44)
AST: 29 U/L (ref 15–41)
Albumin: 3 g/dL — ABNORMAL LOW (ref 3.5–5.0)
Alkaline Phosphatase: 82 U/L (ref 38–126)
Anion gap: 7 (ref 5–15)
BUN: 8 mg/dL (ref 6–20)
CO2: 26 mmol/L (ref 22–32)
Calcium: 8.4 mg/dL — ABNORMAL LOW (ref 8.9–10.3)
Chloride: 102 mmol/L (ref 98–111)
Creatinine, Ser: 0.85 mg/dL (ref 0.44–1.00)
GFR, Estimated: >60 mL/min (ref >60)
Glucose, Bld: 176 mg/dL — ABNORMAL HIGH (ref 70–99)
Potassium: 3.4 mmol/L — ABNORMAL LOW (ref 3.5–5.1)
Sodium: 135 mmol/L (ref 135–145)
Total Bilirubin: 0.6 mg/dL (ref 0.3–1.2)
Total Protein: 6.5 g/dL (ref 6.5–8.1)

## 2020-08-14 LAB — RESP PANEL BY RT-PCR (FLU A&B, COVID) ARPGX2
Influenza A by PCR: NEGATIVE
Influenza B by PCR: NEGATIVE
SARS Coronavirus 2 by RT PCR: NEGATIVE

## 2020-08-14 LAB — CBC
HCT: 35.1 % — ABNORMAL LOW (ref 36.0–46.0)
Hemoglobin: 11.3 g/dL — ABNORMAL LOW (ref 12.0–15.0)
MCH: 22.8 pg — ABNORMAL LOW (ref 26.0–34.0)
MCHC: 32.2 g/dL (ref 30.0–36.0)
MCV: 70.8 fL — ABNORMAL LOW (ref 80.0–100.0)
Platelets: 323 10*3/uL (ref 150–400)
RBC: 4.96 MIL/uL (ref 3.87–5.11)
RDW: 16.5 % — ABNORMAL HIGH (ref 11.5–15.5)
WBC: 6.5 10*3/uL (ref 4.0–10.5)
nRBC: 0 % (ref 0.0–0.2)

## 2020-08-14 LAB — LIPASE, BLOOD: Lipase: 558 U/L — ABNORMAL HIGH (ref 11–51)

## 2020-08-14 LAB — TRIGLYCERIDES: Triglycerides: 166 mg/dL — ABNORMAL HIGH (ref <150)

## 2020-08-14 MED ORDER — HYDROMORPHONE HCL 1 MG/ML IJ SOLN
1.0000 mg | INTRAMUSCULAR | Status: DC | PRN
  Administered 2020-08-14 – 2020-08-15 (×6): 1 mg via INTRAVENOUS
  Filled 2020-08-14 (×7): qty 1

## 2020-08-14 MED ORDER — LACTATED RINGERS IV BOLUS
1000.0000 mL | Freq: Once | INTRAVENOUS | Status: AC
  Administered 2020-08-14: 1000 mL via INTRAVENOUS

## 2020-08-14 MED ORDER — ENOXAPARIN SODIUM 60 MG/0.6ML ~~LOC~~ SOLN: 50.0000 mg | SUBCUTANEOUS | Status: DC

## 2020-08-14 MED ORDER — LACTATED RINGERS IV SOLN: INTRAVENOUS | Status: DC

## 2020-08-14 MED ORDER — POTASSIUM CHLORIDE CRYS ER 20 MEQ PO TBCR
40.0000 meq | EXTENDED_RELEASE_TABLET | Freq: Two times a day (BID) | ORAL | Status: AC
  Administered 2020-08-14 – 2020-08-15 (×4): 40 meq via ORAL
  Filled 2020-08-14 (×4): qty 2

## 2020-08-14 MED ORDER — FAMOTIDINE IN NACL 20-0.9 MG/50ML-% IV SOLN
20.0000 mg | Freq: Two times a day (BID) | INTRAVENOUS | Status: DC
  Administered 2020-08-14 – 2020-08-16 (×5): 20 mg via INTRAVENOUS
  Filled 2020-08-14 (×5): qty 50

## 2020-08-14 MED ORDER — MORPHINE SULFATE (PF) 2 MG/ML IV SOLN
1.0000 mg | INTRAVENOUS | Status: DC | PRN
Start: 2020-08-14 — End: 2020-08-14
  Administered 2020-08-14: 1 mg via INTRAVENOUS
  Filled 2020-08-14: qty 1

## 2020-08-14 MED ORDER — ONDANSETRON HCL 4 MG/2ML IJ SOLN: 4.0000 mg | Freq: Four times a day (QID) | INTRAMUSCULAR | Status: DC | PRN

## 2020-08-14 MED ORDER — ALBUTEROL SULFATE (2.5 MG/3ML) 0.083% IN NEBU
2.5000 mg | INHALATION_SOLUTION | RESPIRATORY_TRACT | Status: DC | PRN
  Administered 2020-08-14 – 2020-08-16 (×8): 2.5 mg via RESPIRATORY_TRACT
  Filled 2020-08-14 (×8): qty 3

## 2020-08-14 NOTE — Progress Notes (Signed)
   08/14/20 0238  Level of Consciousness  Level of Consciousness Alert  MEWS COLOR  MEWS Score Color Green  Pain Assessment  Pain Scale 0-10  Pain Score 10  Pain Type Acute pain  Pain Location Abdomen  Pain Orientation Right;Upper  Pain Radiating Towards back  Pain Descriptors / Indicators Stabbing  Pain Frequency Constant  Pain Onset On-going  Patients Stated Pain Goal 0  Pain Intervention(s) MD notified (Comment);Emotional support  MEWS Score  MEWS Temp 0  MEWS Systolic 0  MEWS Pulse 0  MEWS RR 0  MEWS LOC 0  MEWS Score 0  Provider Notification  Provider Name/Title Dr. Marlowe Sax  Date Provider Notified 08/14/20  Time Provider Notified [phone]  Notification Type Page  Notification Reason Requested by patient/family  Provider response See new orders  Date of Provider Response 08/14/20   Upon arrival to the floor, the patient is complaining of right upper abdominal pain.  She rates as 10/10.  Dr. Marlowe Sax called and made aware.  Order received for 1 mg IV Morphine which was given at [facility].  Will continue to monitor patient.  Earleen Reaper RN

## 2020-08-14 NOTE — Progress Notes (Signed)
Patient seen and examined.  Is still significant epigastric pain as per patient.  She also suffers from significant gastritis issues.  No nausea or vomiting.  Tolerating liquids, however clear liquids making her pain worse and needing for IV pain medications.  Admitted early morning hours by nighttime hospitalist with acute pancreatitis, CT scan with uncomplicated pancreas.  Acute idiopathic pancreatitis: Still with significant pain, discontinue clear liquids, n.p.o. with chips and sips with medications. We will add IV Pepcid twice daily with significant history of gastritis. Adequate pain medications, IV fluid. Triglyceride within normal range, not enough to cause pancreatitis. Right upper quadrant ultrasound without gallbladder disease. Sporadic alcohol use, unlikely causing pancreatitis. First episode of pancreatitis, no indication to further invasive testing until recurrent or worsening. Mobilize in the hallway.

## 2020-08-14 NOTE — ED Notes (Signed)
Pt now saying pain is 7/10 but declines any pain medication at this time; pt also declined this RN calling anyone, says she will use her cell phone to call and update family on transfer; provided card with pt room number and phone number for unit

## 2020-08-14 NOTE — Progress Notes (Signed)
New Admission Note:   Arrival Method: Via [facility] from [facility] Mental Orientation:  A & O x 4 Telemetry: None Assessment: Completed Skin:  Dry but intact.  Several tattoos IV:  Rt. AC NSL Pain: C/O 10/10 stabbing right upper quadrant abdominal pain Tubes:  None Safety Measures: Safety Fall Prevention Plan has been given, discussed and signed Admission: Completed 5MW Orientation: Patient has been orientated to the room, unit and staff.  Family:  From home with husband and children  Patient has pocket book, wallet, cell phone, charger, and clothes.  She states she will send wallet home with husband when he comes later in the day.  All questions answered.  Doctor paged regarding need for pain medication.  All questions and concerns addressed with the patient.  She had new vaginal bleeding on the way here.  It is not time for her period.  She does have a history of irregular periods.  Her mother went into menopause at age 72.  Dr. Marlowe Sax made aware.  Orders have been reviewed and implemented. Will continue to monitor the patient. Call light has been placed within reach and bed alarm has been activated.   Earleen Reaper RN- BC, [facility] Phone number: [phone]

## 2020-08-14 NOTE — H&P (Signed)
History and Physical    Robin Fox FVC:[id number] DOB: Nov 07, 1975 DOA: 08/13/2020  PCP: Patient, No Pcp Per Patient coming from: [place]  Chief Complaint: Abdominal pain, nausea, vomiting, diarrhea  HPI: Robin Fox is a 45 y.o. female with medical history significant of hypertension, reactive airway disease, preeclampsia, gestational diabetes, prior tobacco use, obesity (BMI 35.49), surgical history of cesarean sections seen at urgent care on 08/11/2020 for upper abdominal pain, nausea, vomiting, and diarrhea.  No labs or imaging done during this visit.  Her symptoms were felt to be due to gastritis/GERD versus gastroenteritis given associated diarrhea.  She was discharged from the ED with omeprazole, Pepcid, Zofran, and Maalox.  Nausea, vomiting, and diarrhea improved but she continued to have persistent upper abdominal pain radiating to her back and presented to [facility] ED yesterday 08/13/2020.  In the ED, afebrile with stable vital signs.  Labs showing WBC 7.7, hemoglobin 12.7, platelet count 392K.  Sodium 136, potassium 3.5, chloride 99, bicarb 27, BUN 13, creatinine 0.8, glucose 123.  Lipase significantly elevated at 1029.  LFTs normal.  UA without signs of infection.  Urine pregnancy test negative.  Triglyceride level pending.  SARS-CoV-2 PCR test negative.  Influenza panel negative.  EKG showing no acute ischemic changes.  CT abdomen pelvis showing no acute findings.  Right upper quadrant ultrasound unremarkable. Patient was given Dilaudid, morphine, Zofran, and 1 L normal saline bolus in the ED.  Patient states a week ago she started having severe epigastric abdominal pain associated with nausea, vomiting, and diarrhea.  After she started taking antiacid medications given to her at urgent care, her nausea, vomiting, and diarrhea resolved but she continued to have persistent severe epigastric abdominal pain which also radiates to her back.  States  she drinks alcohol only socially and denies heavy alcohol use.  Denies history of pancreatitis.  Patient states she gets her menstrual cycles every 3 to 4 weeks and her last period was around 07/24/2020.  She is concerned that while at [facility] emergency room she started having vaginal bleeding and her tampon later appeared black in color.  Review of Systems:  All systems reviewed and apart from history of presenting illness, are negative.  Past Medical History:  Diagnosis Date  . Anemia    hx   . Gestational diabetes    Neg 2 hr test after preg  . Headache(784.0)   . Herpes genitalia    2013 - 1st outbreak  . Hypertension    2008  . Pre-eclampsia in third trimester   . Seasonal allergies   . Shortness of breath    w exertion  . Trichomonas   . Twins     Past Surgical History:  Procedure Laterality Date  . CESAREAN SECTION     X 2  . CESAREAN SECTION  04/21/2012   Procedure: CESAREAN SECTION;  Surgeon: Woodroe Mode, MD;  Location: [facility];  Service: Obstetrics;  Laterality: N/A;  . TOOTH EXTRACTION       reports that she quit smoking about 11 months ago. Her smoking use included cigarettes. She has a 4.25 pack-year smoking history. She has never used smokeless tobacco. She reports current alcohol use. She reports that she does not use drugs.  Allergies  Allergen Reactions  . Penicillins Hives, Shortness Of Breath and Swelling    Has patient had a PCN reaction causing immediate rash, facial/tongue/throat swelling, SOB or lightheadedness with hypotension: no Has patient had  a PCN reaction causing severe rash involving mucus membranes or skin necrosis:no Has patient had a PCN reaction that required hospitalization no Has patient had a PCN reaction occurring within the last 10 years: {Yes If all of the above answers are "NO", then may proceed with Cephalosporin use.    Family History  Problem Relation Age of Onset  . Hypertension Mother   . Stroke Mother   .  Stroke Maternal Grandmother   . Heart disease Maternal Grandfather 60  . Hypertension Brother   . Diabetes Brother   . Stomach cancer Brother   . Hypertension Brother   . Diabetes Brother     Prior to Admission medications   Medication Sig Start Date End Date Taking? Authorizing Provider  albuterol (PROVENTIL) (2.5 MG/3ML) 0.083% nebulizer solution Take 3 mLs (2.5 mg total) by nebulization every 6 (six) hours as needed for wheezing or shortness of breath. 05/15/20   [name], NP  albuterol (VENTOLIN HFA) 108 (90 Base) MCG/ACT inhaler Inhale 1-2 puffs into the lungs every 6 (six) hours as needed for wheezing or shortness of breath. 05/05/20   Wieters, Hallie C, PA-C  alum & mag hydroxide-simeth (MAALOX/MYLANTA) 200-200-20 MG/5ML suspension Take 15 mLs by mouth every 6 (six) hours as needed for indigestion or heartburn. 08/11/20   Wieters, Hallie C, PA-C  benzonatate (TESSALON) 100 MG capsule Take 2 capsules (200 mg total) by mouth every 8 (eight) hours. 07/14/20   Wieters, Hallie C, PA-C  famotidine (PEPCID) 20 MG tablet Take 1 tablet (20 mg total) by mouth 2 (two) times daily as needed for heartburn or indigestion. 08/11/20   Wieters, Hallie C, PA-C  ferrous sulfate 325 (65 FE) MG tablet Take 325 mg by mouth daily with breakfast.    [provider]  fluticasone (FLOVENT HFA) 44 MCG/ACT inhaler Inhale 1 puff into the lungs 2 (two) times daily. 06/26/20   [name], MD  hydrochlorothiazide (HYDRODIURIL) 25 MG tablet Take 1 tablet (25 mg total) by mouth daily. 06/26/20   [name], MD  ibuprofen (ADVIL) 800 MG tablet Take 1 tablet (800 mg total) by mouth every 8 (eight) hours as needed for moderate pain. 12/18/19   [name], NP  montelukast (SINGULAIR) 10 MG tablet Take 1 tablet (10 mg total) by mouth at bedtime. 06/26/20   [name], MD  Multiple Vitamin (MULTIVITAMIN) tablet Take 1 tablet by mouth daily.    [provider]  omeprazole  (PRILOSEC) 20 MG capsule Take 1 capsule (20 mg total) by mouth 2 (two) times daily before a meal for 15 days. 08/11/20 08/26/20  Wieters, Hallie C, PA-C  ondansetron (ZOFRAN ODT) 4 MG disintegrating tablet Take 1 tablet (4 mg total) by mouth every 8 (eight) hours as needed for nausea or vomiting. 08/11/20   Wieters, Hallie C, PA-C  potassium chloride SA (KLOR-CON) 20 MEQ tablet Take 1 tablet (20 mEq total) by mouth daily. 07/05/20   [name], MD  valACYclovir (VALTREX) 500 MG tablet TAKE 1 TABLET BY MOUTH ONCE DAILY INCREASE  TO  TWICE  DAILY  FOR  3  DAYS  DURING  OUTBREAK 06/26/20   [name], MD    Physical Exam: Vitals:   08/13/20 2200 08/13/20 2300 08/14/20 0141 08/14/20 0225  BP: 124/78 134/79 137/85 123/78  Pulse: 80 83 77 72  Resp: 18 20 17 16   Temp:  99.2 F (37.3 C) 98.9 F (37.2 C) 98 F (36.7 C)  TempSrc:  Oral Oral  SpO2: 100% 99% 96% 98%  Weight:      Height:    5' 7.5" (1.715 m)    Physical Exam Constitutional:      General: She is not in acute distress. HENT:     Head: Normocephalic and atraumatic.  Eyes:     Extraocular Movements: Extraocular movements intact.     Conjunctiva/sclera: Conjunctivae normal.  Cardiovascular:     Rate and Rhythm: Normal rate and regular rhythm.     Pulses: Normal pulses.  Pulmonary:     Effort: Pulmonary effort is normal. No respiratory distress.     Breath sounds: Normal breath sounds. No wheezing or rales.  Abdominal:     General: Bowel sounds are normal. There is no distension.     Palpations: Abdomen is soft.     Tenderness: There is abdominal tenderness. There is guarding. There is no rebound.     Comments: Epigastrium tender to palpation with guarding  Musculoskeletal:        General: No swelling or tenderness.     Cervical back: Normal range of motion and neck supple.  Skin:    General: Skin is warm and dry.  Neurological:     General: No focal deficit present.     Mental Status: She is alert and oriented  to person, place, and time.     Labs on Admission: I have personally reviewed following labs and imaging studies  CBC: Recent Labs  Lab 08/13/20 1903  WBC 7.7  HGB 12.7  HCT 40.2  MCV 70.5*  PLT 563   Basic Metabolic Panel: Recent Labs  Lab 08/13/20 1903  NA 136  K 3.5  CL 99  CO2 27  GLUCOSE 123*  BUN 13  CREATININE 0.89  CALCIUM 8.8*   GFR: Estimated Creatinine Clearance: 102.2 mL/min (by C-G formula based on SCr of 0.89 mg/dL). Liver Function Tests: Recent Labs  Lab 08/13/20 1903  AST 37  ALT 39  ALKPHOS 98  BILITOT 0.3  PROT 7.6  ALBUMIN 3.8   Recent Labs  Lab 08/13/20 1903  LIPASE 1,029*   No results for input(s): AMMONIA in the last 168 hours. Coagulation Profile: No results for input(s): INR, PROTIME in the last 168 hours. Cardiac Enzymes: No results for input(s): CKTOTAL, CKMB, CKMBINDEX, TROPONINI in the last 168 hours. BNP (last 3 results) No results for input(s): PROBNP in the last 8760 hours. HbA1C: No results for input(s): HGBA1C in the last 72 hours. CBG: No results for input(s): GLUCAP in the last 168 hours. Lipid Profile: No results for input(s): CHOL, HDL, LDLCALC, TRIG, CHOLHDL, LDLDIRECT in the last 72 hours. Thyroid Function Tests: No results for input(s): TSH, T4TOTAL, FREET4, T3FREE, THYROIDAB in the last 72 hours. Anemia Panel: No results for input(s): VITAMINB12, FOLATE, FERRITIN, TIBC, IRON, RETICCTPCT in the last 72 hours. Urine analysis:    Component Value Date/Time   COLORURINE YELLOW 08/13/2020 1842   APPEARANCEUR CLEAR 08/13/2020 1842   LABSPEC 1.025 08/13/2020 1842   PHURINE 6.0 08/13/2020 1842   GLUCOSEU NEGATIVE 08/13/2020 1842   HGBUR LARGE (A) 08/13/2020 1842   BILIRUBINUR NEGATIVE 08/13/2020 1842   BILIRUBINUR negative 07/16/2016 1520   KETONESUR NEGATIVE 08/13/2020 1842   PROTEINUR NEGATIVE 08/13/2020 1842   UROBILINOGEN 0.2 06/22/2020 1038   NITRITE NEGATIVE 08/13/2020 1842   LEUKOCYTESUR NEGATIVE  08/13/2020 1842    Radiological Exams on Admission: CT Abdomen Pelvis W Contrast  Result Date: 08/13/2020 CLINICAL DATA:  Epigastric pain EXAM: CT ABDOMEN AND PELVIS WITH  CONTRAST TECHNIQUE: Multidetector CT imaging of the abdomen and pelvis was performed using the standard protocol following bolus administration of intravenous contrast. CONTRAST:  171mL OMNIPAQUE IOHEXOL 300 MG/ML  SOLN COMPARISON:  Ultrasound 08/13/2020 FINDINGS: Lower chest: Lung bases are clear. No effusions. Heart is normal size. Hepatobiliary: No focal hepatic abnormality. Gallbladder unremarkable. Pancreas: No focal abnormality or ductal dilatation. Spleen: No focal abnormality.  Normal size. Adrenals/Urinary Tract: No adrenal abnormality. No focal renal abnormality. No stones or hydronephrosis. Urinary bladder is unremarkable. Stomach/Bowel: Stomach, large and small bowel grossly unremarkable. Vascular/Lymphatic: No evidence of aneurysm or adenopathy. Reproductive: Uterus and adnexa unremarkable.  No mass. Other: No free fluid or free air. Musculoskeletal: No acute bony abnormality. IMPRESSION: No acute findings in the abdomen or pelvis. Electronically Signed   By: Rolm Baptise M.D.   On: 08/13/2020 21:39   US Abdomen Limited RUQ (LIVER/GB)  Result Date: 08/13/2020 CLINICAL DATA:  45 year old female with right upper quadrant abdominal pain. EXAM: ULTRASOUND ABDOMEN LIMITED RIGHT UPPER QUADRANT COMPARISON:  None. FINDINGS: Gallbladder: No gallstones or wall thickening visualized. No sonographic Murphy sign noted by sonographer. Common bile duct: Diameter: 2 mm Liver: No focal lesion identified. Within normal limits in parenchymal echogenicity. Portal vein is patent on color Doppler imaging with normal direction of blood flow towards the liver. Other: None. IMPRESSION: Unremarkable right upper quadrant ultrasound. Electronically Signed   By: Anner Crete M.D.   On: 08/13/2020 19:50    EKG: Independently reviewed.  Normal sinus  rhythm, no acute ischemic changes.  Assessment/Plan Principal Problem:   Pancreatitis Active Problems:   Essential hypertension   Obesity   Reactive airway disease   Acute epigastric pain/ possible acute pancreatitis: Patient initially presented to urgent care 3 days ago with complaints of upper abdominal pain, nausea, vomiting, and diarrhea.  Her symptoms were felt to be due to gastritis/GERD versus gastroenteritis.  She was discharged with antiemetic and antacids which improved, nausea, vomiting, and diarrhea.  However, she continues to have persistent epigastric abdominal pain radiating to her back.  No fever, leukocytosis, or signs of sepsis.  Lipase significantly elevated at 1029.  LFTs normal.  No evidence of pancreatic inflammation on CT.  Although lipase level can be elevated due to extrapancreatic causes, given persistent epigastric abdominal pain, will treat as possible acute pancreatitis although etiology unclear at this time.  Patient denies heavy alcohol use.  Not on any medications commonly known to cause pancreatitis. No gallstones or hepatobiliary abnormality seen on right upper quadrant ultrasound.  Does not have hypercalcemia. -Aggressive IV fluid hydration, pain control, antiemetic if needed.  Checking triglyceride level.  Clear liquid diet, advance as tolerated.  Vaginal bleeding: Patient states she gets her menstrual cycles every 3 to 4 weeks and her last period was around 07/24/2020.  She is concerned that while at [facility] emergency room she started having vaginal bleeding and her tampon later appeared black in color.  Not endorsing any lower abdominal/pelvic pain or discomfort.  Her vaginal bleeding is most likely due to menstruation.  Hemoglobin normal on initial labs. -Monitor H&H.  If there is concern for heavy bleeding, consult gynecology.  Hypertension: Stable. -Resume home med after pharmacy med rec is done.  Reactive airway disease: Stable.  No signs of  acute exacerbation. -Albuterol as needed  Obesity (BMI 35.49) -Encourage lifestyle modifications such as healthy diet and exercise to achieve weight loss after patient recovers from her acute illness.  DVT prophylaxis: SCDs Code Status: Full code Family  Communication: No family available at this time.  Diagnostic findings and treatment plan discussed with the patient at length. Disposition Plan: Status is: Inpatient  Remains inpatient appropriate because:Ongoing active pain requiring inpatient pain management and IV treatments appropriate due to intensity of illness or inability to take PO   Dispo: The patient is from: Home              Anticipated d/c is to: Home              Patient currently is not medically stable to d/c.   Difficult to place patient No  Level of care: Level of care: Med-Surg   The medical decision making on this patient was of high complexity and the patient is at high risk for clinical deterioration, therefore this is a level 3 visit.  Shela Leff MD [facility]  If 7PM-7AM, please contact night-coverage [email or web address]  08/14/2020, 3:37 AM

## 2020-08-14 NOTE — Plan of Care (Signed)
  Problem: Health Behavior/Discharge Planning: Goal: Ability to manage health-related needs will improve Outcome: Progressing   Problem: Clinical Measurements: Goal: Ability to maintain clinical measurements within normal limits will improve Outcome: Progressing   Problem: Activity: Goal: Risk for activity intolerance will decrease Outcome: Progressing   Problem: Coping: Goal: Level of anxiety will decrease Outcome: Progressing   Problem: Pain Managment: Goal: General experience of comfort will improve Outcome: Progressing   

## 2020-08-15 LAB — COMPREHENSIVE METABOLIC PANEL
ALT: 29 U/L (ref 0–44)
AST: 25 U/L (ref 15–41)
Albumin: 3 g/dL — ABNORMAL LOW (ref 3.5–5.0)
Alkaline Phosphatase: 74 U/L (ref 38–126)
Anion gap: 6 (ref 5–15)
BUN: 5 mg/dL — ABNORMAL LOW (ref 6–20)
CO2: 25 mmol/L (ref 22–32)
Calcium: 8.8 mg/dL — ABNORMAL LOW (ref 8.9–10.3)
Chloride: 104 mmol/L (ref 98–111)
Creatinine, Ser: 0.78 mg/dL (ref 0.44–1.00)
GFR, Estimated: >60 mL/min (ref >60)
Glucose, Bld: 114 mg/dL — ABNORMAL HIGH (ref 70–99)
Potassium: 3.8 mmol/L (ref 3.5–5.1)
Sodium: 135 mmol/L (ref 135–145)
Total Bilirubin: 0.8 mg/dL (ref 0.3–1.2)
Total Protein: 6.3 g/dL — ABNORMAL LOW (ref 6.5–8.1)

## 2020-08-15 LAB — MAGNESIUM: Magnesium: 2 mg/dL (ref 1.7–2.4)

## 2020-08-15 LAB — LIPASE, BLOOD: Lipase: 275 U/L — ABNORMAL HIGH (ref 11–51)

## 2020-08-15 LAB — PHOSPHORUS: Phosphorus: 3.1 mg/dL (ref 2.5–4.6)

## 2020-08-15 MED ORDER — HYDROCODONE-ACETAMINOPHEN 5-325 MG PO TABS
1.0000 | ORAL_TABLET | ORAL | Status: DC | PRN
  Administered 2020-08-15 – 2020-08-16 (×4): 1 via ORAL
  Filled 2020-08-15 (×4): qty 1

## 2020-08-15 NOTE — Progress Notes (Signed)
PROGRESS NOTE    Yaneliz Radebaugh Tieszen  AOZ:[id number] DOB: [birth year] DOA: 08/13/2020 PCP: Patient, No Pcp Per    Brief Narrative:  45 year old female with history of hypertension, reactive airway disease, gestational diabetes, obesity presented to the hospital with upper abdominal pain, nausea vomiting and diarrhea.  Initially went to urgent care on 3/18, was treated with Pepcid Zofran and Maalox.  Her symptoms did not improve so came to ER.  In the emergency room, hemodynamically stable.  Lipase 1029.  LFTs normal.  COVID-19 negative.  Influenza negative.  CT scan with no acute findings.  Gallbladder no acute pathology.  Admitted with acute pancreatitis.   Assessment & Plan:   Principal Problem:   Pancreatitis Active Problems:   Essential hypertension   Obesity   Reactive airway disease  Acute idiopathic pancreatitis: Challenged with clear liquids, recurrence of pain today, will stay on sips of water.  Adequate pain medication.  Lipase is trending down as expected.  Encouraged to use oral pain medication in anticipation of discharge tomorrow morning.  Continue maintenance IV fluids.  Electrolytes are adequate.  LFTs and renal functions are adequate. Right upper quadrant ultrasound negative for any gallbladder disease. Patient denies active alcohol use. Triglycerides normal. No recent infections, she had some pneumonia 2 months ago. Probably self-limiting pancreatitis, with no complications no indication for further testing.  Hypokalemia: Replaced.  Adequate.  Magnesium and phosphorus is adequate.  GERD: Patient has significant reflux symptoms.  On Pepcid IV twice daily.  Will discharge on PPI.  Improving.   DVT prophylaxis: Place and maintain sequential compression device Start: 08/14/20 0412   Code Status: Full code Family Communication: Husband at the bedside Disposition Plan: Status is: Inpatient  Remains inpatient appropriate because:Inpatient level of care appropriate  due to severity of illness   Dispo: The patient is from: Home              Anticipated d/c is to: Home              Patient currently is not medically stable to d/c.   Difficult to place patient No         Consultants:   None  Procedures:   None  Antimicrobials:   None   Subjective: Patient seen and examined.  Overnight she had moderate pain 6-7/10.  She was eager to go home.  She wants to try liquid diet and oral pain medication.  Reexamined in the evening, patient with persistent discomfort, mild pain.  Advised her to stay back tonight to be monitored, continue IV fluids and anticipate discharge tomorrow morning.  Patient is agreeable.  Objective: Vitals:   08/14/20 0910 08/14/20 1653 08/15/20 0432 08/15/20 1001  BP: 122/76 120/80 131/71 119/71  Pulse: 74 76 75 76  Resp: 18 18 17 18   Temp: 98.2 F (36.8 C) 98 F (36.7 C) 99.1 F (37.3 C) 98.4 F (36.9 C)  TempSrc: Oral Oral Oral   SpO2: 99% 98% 96% 97%  Weight:   112.8 kg   Height:        Intake/Output Summary (Last 24 hours) at 08/15/2020 1401 Last data filed at 08/15/2020 1300 Gross per 24 hour  Intake 2965.87 ml  Output 0 ml  Net 2965.87 ml   Filed Weights   08/13/20 1831 08/15/20 0432  Weight: 104.3 kg 112.8 kg    Examination:  General exam: Appears calm and comfortable  Respiratory system: Clear to auscultation. Respiratory effort normal. Cardiovascular system: S1 & S2 heard, RRR. No JVD,  murmurs, rubs, gallops or clicks. No pedal edema. Gastrointestinal system: Nondistended.  Bowel sounds present.  Mild tenderness along the epigastrium. Central nervous system: Alert and oriented. No focal neurological deficits. Extremities: Symmetric 5 x 5 power. Skin: No rashes, lesions or ulcers Psychiatry: Judgement and insight appear normal. Mood & affect appropriate.     Data Reviewed: I have personally reviewed following labs and imaging studies  CBC: Recent Labs  Lab 08/13/20 1903  08/14/20 0800  WBC 7.7 6.5  HGB 12.7 11.3*  HCT 40.2 35.1*  MCV 70.5* 70.8*  PLT 392 993   Basic Metabolic Panel: Recent Labs  Lab 08/13/20 1903 08/14/20 0800 08/15/20 0319  NA 136 135 135  K 3.5 3.4* 3.8  CL 99 102 104  CO2 27 26 25   GLUCOSE 123* 176* 114*  BUN 13 8 5*  CREATININE 0.89 0.85 0.78  CALCIUM 8.8* 8.4* 8.8*  MG  --   --  2.0  PHOS  --   --  3.1   GFR: Estimated Creatinine Clearance: 118.5 mL/min (by C-G formula based on SCr of 0.78 mg/dL). Liver Function Tests: Recent Labs  Lab 08/13/20 1903 08/14/20 0800 08/15/20 0319  AST 37 29 25  ALT 39 31 29  ALKPHOS 98 82 74  BILITOT 0.3 0.6 0.8  PROT 7.6 6.5 6.3*  ALBUMIN 3.8 3.0* 3.0*   Recent Labs  Lab 08/13/20 1903 08/14/20 0800 08/15/20 0319  LIPASE 1,029* 558* 275*   No results for input(s): AMMONIA in the last 168 hours. Coagulation Profile: No results for input(s): INR, PROTIME in the last 168 hours. Cardiac Enzymes: No results for input(s): CKTOTAL, CKMB, CKMBINDEX, TROPONINI in the last 168 hours. BNP (last 3 results) No results for input(s): PROBNP in the last 8760 hours. HbA1C: No results for input(s): HGBA1C in the last 72 hours. CBG: No results for input(s): GLUCAP in the last 168 hours. Lipid Profile: Recent Labs    08/13/20 2053  TRIG 166*   Thyroid Function Tests: No results for input(s): TSH, T4TOTAL, FREET4, T3FREE, THYROIDAB in the last 72 hours. Anemia Panel: No results for input(s): VITAMINB12, FOLATE, FERRITIN, TIBC, IRON, RETICCTPCT in the last 72 hours. Sepsis Labs: No results for input(s): PROCALCITON, LATICACIDVEN in the last 168 hours.  Recent Results (from the past 240 hour(s))  Resp Panel by RT-PCR (Flu A&B, Covid) Nasopharyngeal Swab     Status: None   Collection Time: 08/13/20 11:45 PM   Specimen: Nasopharyngeal Swab; Nasopharyngeal(NP) swabs in vial transport medium  Result Value Ref Range Status   SARS Coronavirus 2 by RT PCR NEGATIVE NEGATIVE Final     Comment: (NOTE) SARS-CoV-2 target nucleic acids are NOT DETECTED.  The SARS-CoV-2 RNA is generally detectable in upper respiratory specimens during the acute phase of infection. The lowest concentration of SARS-CoV-2 viral copies this assay can detect is 138 copies/mL. A negative result does not preclude SARS-Cov-2 infection and should not be used as the sole basis for treatment or other patient management decisions. A negative result may occur with  improper specimen collection/handling, submission of specimen other than nasopharyngeal swab, presence of viral mutation(s) within the areas targeted by this assay, and inadequate number of viral copies(<138 copies/mL). A negative result must be combined with clinical observations, patient history, and epidemiological information. The expected result is Negative.  Fact Sheet for Patients:  EntrepreneurPulse.com.au  Fact Sheet for Healthcare Providers:  IncredibleEmployment.be  This test is no t yet approved or cleared by the [place] FDA and  has  been authorized for detection and/or diagnosis of SARS-CoV-2 by FDA under an Emergency Use Authorization (EUA). This EUA will remain  in effect (meaning this test can be used) for the duration of the COVID-19 declaration under Section 564(b)(1) of the Act, 21 U.S.C.section 360bbb-3(b)(1), unless the authorization is terminated  or revoked sooner.       Influenza A by PCR NEGATIVE NEGATIVE Final   Influenza B by PCR NEGATIVE NEGATIVE Final    Comment: (NOTE) The Xpert Xpress SARS-CoV-2/FLU/RSV plus assay is intended as an aid in the diagnosis of influenza from Nasopharyngeal swab specimens and should not be used as a sole basis for treatment. Nasal washings and aspirates are unacceptable for Xpert Xpress SARS-CoV-2/FLU/RSV testing.  Fact Sheet for Patients: EntrepreneurPulse.com.au  Fact Sheet for Healthcare  Providers: IncredibleEmployment.be  This test is not yet approved or cleared by the [place] FDA and has been authorized for detection and/or diagnosis of SARS-CoV-2 by FDA under an Emergency Use Authorization (EUA). This EUA will remain in effect (meaning this test can be used) for the duration of the COVID-19 declaration under Section 564(b)(1) of the Act, 21 U.S.C. section 360bbb-3(b)(1), unless the authorization is terminated or revoked.  Performed at [facility], [address] [place], [place] [address]          Radiology Studies: CT Abdomen Pelvis W Contrast  Result Date: 08/13/2020 CLINICAL DATA:  Epigastric pain EXAM: CT ABDOMEN AND PELVIS WITH CONTRAST TECHNIQUE: Multidetector CT imaging of the abdomen and pelvis was performed using the standard protocol following bolus administration of intravenous contrast. CONTRAST:  139mL OMNIPAQUE IOHEXOL 300 MG/ML  SOLN COMPARISON:  Ultrasound 08/13/2020 FINDINGS: Lower chest: Lung bases are clear. No effusions. Heart is normal size. Hepatobiliary: No focal hepatic abnormality. Gallbladder unremarkable. Pancreas: No focal abnormality or ductal dilatation. Spleen: No focal abnormality.  Normal size. Adrenals/Urinary Tract: No adrenal abnormality. No focal renal abnormality. No stones or hydronephrosis. Urinary bladder is unremarkable. Stomach/Bowel: Stomach, large and small bowel grossly unremarkable. Vascular/Lymphatic: No evidence of aneurysm or adenopathy. Reproductive: Uterus and adnexa unremarkable.  No mass. Other: No free fluid or free air. Musculoskeletal: No acute bony abnormality. IMPRESSION: No acute findings in the abdomen or pelvis. Electronically Signed   By: Rolm Baptise M.D.   On: 08/13/2020 21:39   US Abdomen Limited RUQ (LIVER/GB)  Result Date: 08/13/2020 CLINICAL DATA:  45 year old female with right upper quadrant abdominal pain. EXAM: ULTRASOUND ABDOMEN LIMITED RIGHT UPPER QUADRANT  COMPARISON:  None. FINDINGS: Gallbladder: No gallstones or wall thickening visualized. No sonographic Murphy sign noted by sonographer. Common bile duct: Diameter: 2 mm Liver: No focal lesion identified. Within normal limits in parenchymal echogenicity. Portal vein is patent on color Doppler imaging with normal direction of blood flow towards the liver. Other: None. IMPRESSION: Unremarkable right upper quadrant ultrasound. Electronically Signed   By: Anner Crete M.D.   On: 08/13/2020 19:50        Scheduled Meds:  potassium chloride  40 mEq Oral BID   Continuous Infusions:  famotidine (PEPCID) IV 20 mg (08/15/20 1029)   lactated ringers 200 mL/hr at 08/15/20 0841     LOS: 1 day    Time spent: 30 minutes    Barb Merino, MD [facility] Pager [phone]

## 2020-08-15 NOTE — Plan of Care (Signed)
  Problem: Health Behavior/Discharge Planning: Goal: Ability to manage health-related needs will improve Outcome: Progressing   Problem: Clinical Measurements: Goal: Ability to maintain clinical measurements within normal limits will improve Outcome: Progressing   

## 2020-08-15 NOTE — Plan of Care (Signed)
  Problem: Health Behavior/Discharge Planning: Goal: Ability to manage health-related needs will improve Outcome: Progressing   Problem: Clinical Measurements: Goal: Ability to maintain clinical measurements within normal limits will improve Outcome: Progressing   Problem: Activity: Goal: Risk for activity intolerance will decrease Outcome: Progressing   Problem: Nutrition: Goal: Adequate nutrition will be maintained Outcome: Progressing   Problem: Coping: Goal: Level of anxiety will decrease Outcome: Progressing   Problem: Pain Managment: Goal: General experience of comfort will improve Outcome: Progressing

## 2020-08-15 NOTE — Plan of Care (Signed)
  Problem: Health Behavior/Discharge Planning: Goal: Ability to manage health-related needs will improve Outcome: Progressing   Problem: Clinical Measurements: Goal: Ability to maintain clinical measurements within normal limits will improve Outcome: Progressing   Problem: Activity: Goal: Risk for activity intolerance will decrease Outcome: Progressing   

## 2020-08-16 DIAGNOSIS — K859 Acute pancreatitis without necrosis or infection, unspecified: Secondary | ICD-10-CM

## 2020-08-16 LAB — COMPREHENSIVE METABOLIC PANEL
ALT: 26 U/L (ref 0–44)
AST: 24 U/L (ref 15–41)
Albumin: 2.9 g/dL — ABNORMAL LOW (ref 3.5–5.0)
Alkaline Phosphatase: 83 U/L (ref 38–126)
Anion gap: 6 (ref 5–15)
BUN: <5 mg/dL — ABNORMAL LOW (ref 6–20)
CO2: 23 mmol/L (ref 22–32)
Calcium: 8.9 mg/dL (ref 8.9–10.3)
Chloride: 107 mmol/L (ref 98–111)
Creatinine, Ser: 0.86 mg/dL (ref 0.44–1.00)
GFR, Estimated: >60 mL/min (ref >60)
Glucose, Bld: 103 mg/dL — ABNORMAL HIGH (ref 70–99)
Potassium: 4.4 mmol/L (ref 3.5–5.1)
Sodium: 136 mmol/L (ref 135–145)
Total Bilirubin: 0.4 mg/dL (ref 0.3–1.2)
Total Protein: 6.1 g/dL — ABNORMAL LOW (ref 6.5–8.1)

## 2020-08-16 LAB — LIPASE, BLOOD: Lipase: 165 U/L — ABNORMAL HIGH (ref 11–51)

## 2020-08-16 MED ORDER — HYDROCODONE-ACETAMINOPHEN 5-325 MG PO TABS: 1.0000 | ORAL_TABLET | Freq: Three times a day (TID) | ORAL | 0 refills | Status: DC | PRN

## 2020-08-16 NOTE — Discharge Instructions (Signed)
Acute Pancreatitis    The pancreas is a gland that is located behind the stomach on the left side of the abdomen. It produces enzymes that help to digest food. The pancreas also releases the hormones glucagon and insulin, which help to regulate blood sugar. Acute pancreatitis happens when inflammation of the pancreas suddenly occurs and the pancreas becomes irritated and swollen.  Most acute attacks last a few days and cause serious problems. Some people become dehydrated and develop low blood pressure. In severe cases, bleeding in the abdomen can lead to shock and can be life-threatening. The lungs, heart, and kidneys may fail.  What are the causes?  This condition may be caused by:  · Alcohol abuse.  · Drug abuse.  · Gallstones or other conditions that can block the tube that drains the pancreas (pancreatic duct).  · A tumor in the pancreas.  Other causes include:  · Certain medicines.  · Exposure to certain chemicals.  · Diabetes.  · An infection in the pancreas.  · Damage caused by an accident (trauma).  · The poison (venom) from a scorpion bite.  · Abdominal surgery.  · Autoimmune pancreatitis. This is when the body's disease-fighting (immune) system attacks the pancreas.  · Genes that are passed from parent to child (inherited).  In some cases, the cause of this condition is not known.  What are the signs or symptoms?  Symptoms of this condition include:  · Pain in the upper abdomen that may radiate to the back. Pain may be severe.  · Tenderness and swelling of the abdomen.  · Nausea and vomiting.  · Fever.  How is this diagnosed?  This condition may be diagnosed based on:  · A physical exam.  · Blood tests.  · Imaging tests, such as X-rays, CT or MRI scans, or an ultrasound of the abdomen.  How is this treated?  Treatment for this condition usually requires a stay in the hospital. Treatment for this condition may include:  · Pain medicine.  · Fluid replacement through an IV.  · Placing a tube in the stomach  to remove stomach contents and to control vomiting (NG tube, or nasogastric tube).  · Not eating for 3-4 days. This gives the pancreas a rest, because enzymes are not being produced that can cause further damage.  · Antibiotic medicines, if your condition is caused by an infection.  · Treating any underlying conditions that may be the cause.  · Steroid medicines, if your condition is caused by your immune system attacking your body's own tissues (autoimmune disease).  · Surgery on the pancreas or gallbladder.  Follow these instructions at home:  Eating and drinking    · Follow instructions from your health care provider about diet. This may involve avoiding alcohol and decreasing the amount of fat in your diet.  · Eat smaller, more frequent meals. This reduces the amount of digestive fluids that the pancreas produces.  · Drink enough fluid to keep your urine pale yellow.  · Do not drink alcohol if it caused your condition.  General instructions  · Take over-the-counter and prescription medicines only as told by your health care provider.  · Do not drive or use heavy machinery while taking prescription pain medicine.  · Ask your health care provider if the medicine prescribed to you can cause constipation. You may need to take steps to prevent or treat constipation, such as:  ? Take an over-the-counter or prescription medicine for constipation.  ? Eat   foods that are high in fiber such as whole grains and beans.  ? Limit foods that are high in fat and processed sugars, such as fried or sweet foods.  · Do not use any products that contain nicotine or tobacco, such as cigarettes, e-cigarettes, and chewing tobacco. If you need help quitting, ask your health care provider.  · Get plenty of rest.  · If directed, check your blood sugar at home as told by your health care provider.  · Keep all follow-up visits as told by your health care provider. This is important.  Contact a health care provider if you:  · Do not recover  as quickly as expected.  · Develop new or worsening symptoms.  · Have persistent pain, weakness, or nausea.  · Recover and then have another episode of pain.  · Have a fever.  Get help right away if:  · You cannot eat or keep fluids down.  · Your pain becomes severe.  · Your skin or the white part of your eyes turns yellow (jaundice).  · You have sudden swelling in your abdomen.  · You vomit.  · You feel dizzy or you faint.  · Your blood sugar is high (over 300 mg/dL).  Summary  · Acute pancreatitis happens when inflammation of the pancreas suddenly occurs and the pancreas becomes irritated and swollen.  · This condition is typically caused by alcohol abuse, drug abuse, or gallstones.  · Treatment for this condition usually requires a stay in the hospital.  This information is not intended to replace advice given to you by your health care provider. Make sure you discuss any questions you have with your health care provider.  Document Revised: 03/02/2018 Document Reviewed: 11/17/2017  Elsevier Patient Education © 2021 [employer]

## 2020-08-16 NOTE — Progress Notes (Signed)
DISCHARGE NOTE HOME Robin Fox to be discharged home per MD order. Discussed prescriptions and follow up appointments with the patient. Prescriptions given to patient; medication list explained in detail. Patient verbalized understanding.  Skin clean, dry and intact without evidence of skin break down, no evidence of skin tears noted. IV catheter discontinued intact. Site without signs and symptoms of complications. Dressing and pressure applied. Pt denies pain at the site currently. No complaints noted.  Patient free of lines, drains, and wounds.   An After Visit Summary (AVS) was printed and given to the patient. Patient escorted via wheelchair, and discharged home via private auto.  [name], RN

## 2020-08-16 NOTE — Progress Notes (Signed)
Pt tolerated soft diet well. No pain, no nausea, no vomiting.

## 2020-08-17 NOTE — Discharge Summary (Signed)
[facility] Discharge Summary   Patient: Robin Fox ZOX:[id number]  PCP: Patient, No Pcp Per  Date of admission: 08/13/2020   Date of discharge: 08/16/2020     Discharge Diagnoses:  Principal Problem:   Pancreatitis Active Problems:   Essential hypertension   Obesity   Reactive airway disease   Admitted From: HOME Disposition:  Home   Recommendations for Outpatient Follow-up:  1. PCP: follow up in 1-2 weeks 2. Follow up LABS/TEST:  none   Follow-up Information    PCP. Schedule an appointment as soon as possible for a visit in 1 week(s).              Discharge Instructions    Diet fat modified   Complete by: As directed    Discharge instructions   Complete by: As directed    It is important that you read the instructions as well as go over your medication list with RN to help you understand your care after this hospitalization.  Please follow-up with PCP in 1-2 weeks.  Please note that we are unable to authorize any refills for discharge medications, once you are discharged. Thus, it is imperative that you return to your primary care physician (or establish a relationship with a primary care physician if you do not have one) for your care needs. So that they can reassess your need for medications and monitor your lab values.  Please request your primary care physician to go over all Hospital Tests and Procedure/Radiological results at the follow up. Please get all Hospital records sent to your PCP by signing hospital release before you go home.   Do not drive, operating heavy machinery, perform activities at heights, swimming or participation in water activities or provide baby sitting services while you are on Pain, Sleep and Anxiety Medications; until you have been seen by Primary Care Physician and are cleared to do such activities.  Do not take more than prescribed Pain, Sleep and Anxiety Medications.  You were cared for by a hospitalist during your  hospital stay. If you have any questions about your discharge medications or the care you received while you were in the hospital after you are discharged, you can call the hospital unit/floor you were admitted to and ask to speak with the hospitalist who took care of you. Ask for Hospitalist on call, if the hospitalist that took care of you is not available. Once you are discharged, your primary care physician will help you with any further medical issues. You Must read complete instructions/literature along with all the possible adverse reactions/side effects for all the Medicines you take and that have been prescribed to you. Take any new Medicines after you have completely understood and accept all the possible adverse reactions/side effects. If you have smoked or chewed Tobacco, please STOP smoking. If you drink alcohol, please safely STOP the use. Do not drive, operating heavy machinery, perform activities at heights, swimming or participation in water activities or provide baby sitting services under influence.   Increase activity slowly   Complete by: As directed       Diet recommendation: Cardiac diet  Activity: The patient is advised to gradually reintroduce usual activities, as tolerated  Discharge Condition: stable  Code Status: Full code   History of present illness: As per the H and P dictated on admission, " Robin Fox is a 45 y.o. female with medical history significant of hypertension, reactive airway disease, preeclampsia, gestational diabetes, prior tobacco use, obesity (BMI 35.49),  surgical history of cesarean sections seen at urgent care on 08/11/2020 for upper abdominal pain, nausea, vomiting, and diarrhea.  No labs or imaging done during this visit.  Her symptoms were felt to be due to gastritis/GERD versus gastroenteritis given associated diarrhea.  She was discharged from the ED with omeprazole, Pepcid, Zofran, and Maalox.  Nausea, vomiting, and diarrhea improved  but she continued to have persistent upper abdominal pain radiating to her back and presented to [place] ED yesterday 08/13/2020.  In the ED, afebrile with stable vital signs.  Labs showing WBC 7.7, hemoglobin 12.7, platelet count 392K.  Sodium 136, potassium 3.5, chloride 99, bicarb 27, BUN 13, creatinine 0.8, glucose 123.  Lipase significantly elevated at 1029.  LFTs normal.  UA without signs of infection.  Urine pregnancy test negative.  Triglyceride level pending.  SARS-CoV-2 PCR test negative.  Influenza panel negative.  EKG showing no acute ischemic changes.  CT abdomen pelvis showing no acute findings.  Right upper quadrant ultrasound unremarkable. Patient was given Dilaudid, morphine, Zofran, and 1 L normal saline bolus in the ED.  Patient states a week ago she started having severe epigastric abdominal pain associated with nausea, vomiting, and diarrhea.  After she started taking antiacid medications given to her at urgent care, her nausea, vomiting, and diarrhea resolved but she continued to have persistent severe epigastric abdominal pain which also radiates to her back.  States she drinks alcohol only socially and denies heavy alcohol use.  Denies history of pancreatitis.  Patient states she gets her menstrual cycles every 3 to 4 weeks and her last period was around 07/24/2020.  She is concerned that while at [facility] emergency room she started having vaginal bleeding and her tampon later appeared black in color."  Hospital Course:  Summary of her active problems in the hospital is as following.   Acute idiopathic pancreatitis:  Etiology no clear.  Recommendation is to monitor for reoccurrence and may need further workup if has recurrent pancreatitis.  Lipase is trending down as expected.   LFTs and renal functions are adequate. Right upper quadrant ultrasound negative for any gallbladder disease. Patient denies active alcohol use. Triglycerides normal. No recent  infections, she had some pneumonia 2 months ago.  Hypokalemia:  Replaced.  GERD:  Patient has significant reflux symptoms.  Obesity Placing the pt at high risk for poor outcomes.  Body mass index is 38.37 kg/m.   Pain control  - [employer] Controlled Substance Reporting System database was reviewed. - 5 day supply was provided. - Patient was instructed, not to drive, operate heavy machinery, perform activities at heights, swimming or participation in water activities or provide baby sitting services while on Pain, Sleep and Anxiety Medications; until her outpatient Physician has advised to do so again.  - Also recommended to not to take more than prescribed Pain, Sleep and Anxiety Medications.  Patient was ambulatory without any assistance. On the day of the discharge the patient's vitals were stable, and no other acute medical condition were reported by patient. The patient was felt safe to be discharge at Home with no therapy needed on discharge.  Consultants: none Procedures: none  DISCHARGE MEDICATION: Allergies as of 08/16/2020      Reactions   Penicillins Hives, Shortness Of Breath, Swelling   Has patient had a PCN reaction causing immediate rash, facial/tongue/throat swelling, SOB or lightheadedness with hypotension: no Has patient had a PCN reaction causing severe rash involving mucus membranes or skin  necrosis:no Has patient had a PCN reaction that required hospitalization no Has patient had a PCN reaction occurring within the last 10 years: {Yes If all of the above answers are "NO", then may proceed with Cephalosporin use.      Medication List    STOP taking these medications   benzonatate 100 MG capsule Commonly known as: TESSALON   Flovent HFA 44 MCG/ACT inhaler Generic drug: fluticasone   hydrochlorothiazide 25 MG tablet Commonly known as: HYDRODIURIL   ibuprofen 200 MG tablet Commonly known as: ADVIL   ibuprofen 800 MG tablet Commonly known as:  ADVIL   omeprazole 20 MG capsule Commonly known as: PRILOSEC   potassium chloride SA 20 MEQ tablet Commonly known as: KLOR-CON     TAKE these medications   acetaminophen 500 MG tablet Commonly known as: TYLENOL Take 1,000 mg by mouth every 6 (six) hours as needed for mild pain or headache.   albuterol 108 (90 Base) MCG/ACT inhaler Commonly known as: VENTOLIN HFA Inhale 1-2 puffs into the lungs every 6 (six) hours as needed for wheezing or shortness of breath.   albuterol (2.5 MG/3ML) 0.083% nebulizer solution Commonly known as: PROVENTIL Take 3 mLs (2.5 mg total) by nebulization every 6 (six) hours as needed for wheezing or shortness of breath.   alum & mag hydroxide-simeth 200-200-20 MG/5ML suspension Commonly known as: MAALOX/MYLANTA Take 15 mLs by mouth every 6 (six) hours as needed for indigestion or heartburn.   famotidine 20 MG tablet Commonly known as: PEPCID Take 1 tablet (20 mg total) by mouth 2 (two) times daily as needed for heartburn or indigestion.   ferrous sulfate 325 (65 FE) MG tablet Take 325 mg by mouth daily with breakfast.   HYDROcodone-acetaminophen 5-325 MG tablet Commonly known as: NORCO/VICODIN Take 1 tablet by mouth 3 (three) times daily as needed for moderate pain or severe pain.   montelukast 10 MG tablet Commonly known as: SINGULAIR Take 1 tablet (10 mg total) by mouth at bedtime.   multivitamin tablet Take 1 tablet by mouth daily.   ondansetron 4 MG disintegrating tablet Commonly known as: Zofran ODT Take 1 tablet (4 mg total) by mouth every 8 (eight) hours as needed for nausea or vomiting.   valACYclovir 500 MG tablet Commonly known as: VALTREX TAKE 1 TABLET BY MOUTH ONCE DAILY INCREASE  TO  TWICE  DAILY  FOR  3  DAYS  DURING  OUTBREAK What changed:   how much to take  how to take this  when to take this  additional instructions       Discharge Exam: Filed Weights   08/13/20 1831 08/15/20 0432 08/15/20 2100  Weight:  104.3 kg 112.8 kg 112.8 kg   Vitals:   08/16/20 0624 08/16/20 1037  BP: 120/80 (!) 141/73  Pulse: 71 83  Resp: 18 18  Temp: 98.3 F (36.8 C) 98.1 F (36.7 C)  SpO2: 98% 97%   General: Appear in no distress, no Rash; Oral Mucosa Clear, moist. no Abnormal Neck Mass Or lumps, Conjunctiva normal  Cardiovascular: S1 and S2 Present, no Murmur Respiratory: good respiratory effort, Bilateral Air entry present and CTA, no Crackles, no wheezes Abdomen: Bowel Sound present, Soft and no tenderness Extremities: no Pedal edema Neurology: alert and oriented to time, place, and person affect appropriate. no new focal deficit  The results of significant diagnostics from this hospitalization (including imaging, microbiology, ancillary and laboratory) are listed below for reference.    Significant Diagnostic Studies: CT Abdomen Pelvis W Contrast  Result Date: 08/13/2020 CLINICAL DATA:  Epigastric pain EXAM: CT ABDOMEN AND PELVIS WITH CONTRAST TECHNIQUE: Multidetector CT imaging of the abdomen and pelvis was performed using the standard protocol following bolus administration of intravenous contrast. CONTRAST:  171mL OMNIPAQUE IOHEXOL 300 MG/ML  SOLN COMPARISON:  Ultrasound 08/13/2020 FINDINGS: Lower chest: Lung bases are clear. No effusions. Heart is normal size. Hepatobiliary: No focal hepatic abnormality. Gallbladder unremarkable. Pancreas: No focal abnormality or ductal dilatation. Spleen: No focal abnormality.  Normal size. Adrenals/Urinary Tract: No adrenal abnormality. No focal renal abnormality. No stones or hydronephrosis. Urinary bladder is unremarkable. Stomach/Bowel: Stomach, large and small bowel grossly unremarkable. Vascular/Lymphatic: No evidence of aneurysm or adenopathy. Reproductive: Uterus and adnexa unremarkable.  No mass. Other: No free fluid or free air. Musculoskeletal: No acute bony abnormality. IMPRESSION: No acute findings in the abdomen or pelvis. Electronically Signed   By: Rolm Baptise M.D.   On: 08/13/2020 21:39   US Abdomen Limited RUQ (LIVER/GB)  Result Date: 08/13/2020 CLINICAL DATA:  45 year old female with right upper quadrant abdominal pain. EXAM: ULTRASOUND ABDOMEN LIMITED RIGHT UPPER QUADRANT COMPARISON:  None. FINDINGS: Gallbladder: No gallstones or wall thickening visualized. No sonographic Murphy sign noted by sonographer. Common bile duct: Diameter: 2 mm Liver: No focal lesion identified. Within normal limits in parenchymal echogenicity. Portal vein is patent on color Doppler imaging with normal direction of blood flow towards the liver. Other: None. IMPRESSION: Unremarkable right upper quadrant ultrasound. Electronically Signed   By: Anner Crete M.D.   On: 08/13/2020 19:50    Microbiology: Recent Results (from the past 240 hour(s))  Resp Panel by RT-PCR (Flu A&B, Covid) Nasopharyngeal Swab     Status: None   Collection Time: 08/13/20 11:45 PM   Specimen: Nasopharyngeal Swab; Nasopharyngeal(NP) swabs in vial transport medium  Result Value Ref Range Status   SARS Coronavirus 2 by RT PCR NEGATIVE NEGATIVE Final    Comment: (NOTE) SARS-CoV-2 target nucleic acids are NOT DETECTED.  The SARS-CoV-2 RNA is generally detectable in upper respiratory specimens during the acute phase of infection. The lowest concentration of SARS-CoV-2 viral copies this assay can detect is 138 copies/mL. A negative result does not preclude SARS-Cov-2 infection and should not be used as the sole basis for treatment or other patient management decisions. A negative result may occur with  improper specimen collection/handling, submission of specimen other than nasopharyngeal swab, presence of viral mutation(s) within the areas targeted by this assay, and inadequate number of viral copies(<138 copies/mL). A negative result must be combined with clinical observations, patient history, and epidemiological information. The expected result is Negative.  Fact Sheet for Patients:   EntrepreneurPulse.com.au  Fact Sheet for Healthcare Providers:  IncredibleEmployment.be  This test is no t yet approved or cleared by the [place] FDA and  has been authorized for detection and/or diagnosis of SARS-CoV-2 by FDA under an Emergency Use Authorization (EUA). This EUA will remain  in effect (meaning this test can be used) for the duration of the COVID-19 declaration under Section 564(b)(1) of the Act, 21 U.S.C.section 360bbb-3(b)(1), unless the authorization is terminated  or revoked sooner.       Influenza A by PCR NEGATIVE NEGATIVE Final   Influenza B by PCR NEGATIVE NEGATIVE Final    Comment: (NOTE) The Xpert Xpress SARS-CoV-2/FLU/RSV plus assay is intended as an aid in the diagnosis of influenza from Nasopharyngeal swab specimens and should not be used as a sole basis for treatment. Nasal washings and aspirates are unacceptable for Xpert Xpress  SARS-CoV-2/FLU/RSV testing.  Fact Sheet for Patients: EntrepreneurPulse.com.au  Fact Sheet for Healthcare Providers: IncredibleEmployment.be  This test is not yet approved or cleared by the [place] FDA and has been authorized for detection and/or diagnosis of SARS-CoV-2 by FDA under an Emergency Use Authorization (EUA). This EUA will remain in effect (meaning this test can be used) for the duration of the COVID-19 declaration under Section 564(b)(1) of the Act, 21 U.S.C. section 360bbb-3(b)(1), unless the authorization is terminated or revoked.  Performed at [facility], [address] [place], [place] [address]      Labs: CBC: Recent Labs  Lab 08/13/20 1903 08/14/20 0800  WBC 7.7 6.5  HGB 12.7 11.3*  HCT 40.2 35.1*  MCV 70.5* 70.8*  PLT 392 539   Basic Metabolic Panel: Recent Labs  Lab 08/13/20 1903 08/14/20 0800 08/15/20 0319 08/16/20 0244  NA 136 135 135 136  K 3.5 3.4* 3.8 4.4  CL 99 102 104 107   CO2 27 26 25 23   GLUCOSE 123* 176* 114* 103*  BUN 13 8 5* <5*  CREATININE 0.89 0.85 0.78 0.86  CALCIUM 8.8* 8.4* 8.8* 8.9  MG  --   --  2.0  --   PHOS  --   --  3.1  --    Liver Function Tests: Recent Labs  Lab 08/13/20 1903 08/14/20 0800 08/15/20 0319 08/16/20 0244  AST 37 29 25 24   ALT 39 31 29 26   ALKPHOS 98 82 74 83  BILITOT 0.3 0.6 0.8 0.4  PROT 7.6 6.5 6.3* 6.1*  ALBUMIN 3.8 3.0* 3.0* 2.9*   CBG: No results for input(s): GLUCAP in the last 168 hours.  Time spent: 35 minutes  Signed:  Berle Mull  [facility] 08/16/2020 7:27 AM

## 2020-08-23 ENCOUNTER — Encounter (HOSPITAL_COMMUNITY): Payer: Self-pay | Admitting: Emergency Medicine

## 2020-08-23 ENCOUNTER — Emergency Department (HOSPITAL_COMMUNITY): Payer: 59

## 2020-08-23 ENCOUNTER — Other Ambulatory Visit: Payer: Self-pay

## 2020-08-23 ENCOUNTER — Emergency Department (HOSPITAL_COMMUNITY)
Admission: EM | Admit: 2020-08-23 | Discharge: 2020-08-24 | Disposition: A | Discharge status: Home / Self Care | Payer: 59 | Attending: Emergency Medicine | Admitting: Emergency Medicine

## 2020-08-23 DIAGNOSIS — R1010 Upper abdominal pain, unspecified: Secondary | ICD-10-CM | POA: Insufficient documentation

## 2020-08-23 DIAGNOSIS — E860 Dehydration: Secondary | ICD-10-CM | POA: Insufficient documentation

## 2020-08-23 DIAGNOSIS — I10 Essential (primary) hypertension: Secondary | ICD-10-CM | POA: Diagnosis not present

## 2020-08-23 DIAGNOSIS — R112 Nausea with vomiting, unspecified: Secondary | ICD-10-CM | POA: Diagnosis present

## 2020-08-23 DIAGNOSIS — Z87891 Personal history of nicotine dependence: Secondary | ICD-10-CM | POA: Insufficient documentation

## 2020-08-23 DIAGNOSIS — R5383 Other fatigue: Secondary | ICD-10-CM | POA: Insufficient documentation

## 2020-08-23 DIAGNOSIS — Z20822 Contact with and (suspected) exposure to covid-19: Secondary | ICD-10-CM | POA: Insufficient documentation

## 2020-08-23 DIAGNOSIS — R059 Cough, unspecified: Secondary | ICD-10-CM | POA: Diagnosis not present

## 2020-08-23 DIAGNOSIS — J45909 Unspecified asthma, uncomplicated: Secondary | ICD-10-CM | POA: Insufficient documentation

## 2020-08-23 LAB — COMPREHENSIVE METABOLIC PANEL
ALT: 23 U/L (ref 0–44)
AST: 25 U/L (ref 15–41)
Albumin: 4 g/dL (ref 3.5–5.0)
Alkaline Phosphatase: 93 U/L (ref 38–126)
Anion gap: 12 (ref 5–15)
BUN: 10 mg/dL (ref 6–20)
CO2: 29 mmol/L (ref 22–32)
Calcium: 9.6 mg/dL (ref 8.9–10.3)
Chloride: 92 mmol/L — ABNORMAL LOW (ref 98–111)
Creatinine, Ser: 1.02 mg/dL — ABNORMAL HIGH (ref 0.44–1.00)
GFR, Estimated: >60 mL/min (ref >60)
Glucose, Bld: 103 mg/dL — ABNORMAL HIGH (ref 70–99)
Potassium: 3.3 mmol/L — ABNORMAL LOW (ref 3.5–5.1)
Sodium: 133 mmol/L — ABNORMAL LOW (ref 135–145)
Total Bilirubin: 0.6 mg/dL (ref 0.3–1.2)
Total Protein: 8.6 g/dL — ABNORMAL HIGH (ref 6.5–8.1)

## 2020-08-23 LAB — URINALYSIS, ROUTINE W REFLEX MICROSCOPIC
Bilirubin Urine: NEGATIVE
Glucose, UA: NEGATIVE mg/dL
Ketones, ur: NEGATIVE mg/dL
Leukocytes,Ua: NEGATIVE
Nitrite: NEGATIVE
Protein, ur: 100 mg/dL — AB
Specific Gravity, Urine: 1.03 (ref 1.005–1.030)
pH: 5 (ref 5.0–8.0)

## 2020-08-23 LAB — CBC
HCT: 40.5 % (ref 36.0–46.0)
Hemoglobin: 12.8 g/dL (ref 12.0–15.0)
MCH: 21.9 pg — ABNORMAL LOW (ref 26.0–34.0)
MCHC: 31.6 g/dL (ref 30.0–36.0)
MCV: 69.3 fL — ABNORMAL LOW (ref 80.0–100.0)
Platelets: 463 10*3/uL — ABNORMAL HIGH (ref 150–400)
RBC: 5.84 MIL/uL — ABNORMAL HIGH (ref 3.87–5.11)
RDW: 15.4 % (ref 11.5–15.5)
WBC: 8.4 10*3/uL (ref 4.0–10.5)
nRBC: 0 % (ref 0.0–0.2)

## 2020-08-23 LAB — LIPASE, BLOOD: Lipase: 185 U/L — ABNORMAL HIGH (ref 11–51)

## 2020-08-23 MED ORDER — SODIUM CHLORIDE 0.9 % IV BOLUS
1000.0000 mL | Freq: Once | INTRAVENOUS | Status: AC
  Administered 2020-08-23: 1000 mL via INTRAVENOUS

## 2020-08-23 MED ORDER — ONDANSETRON HCL 4 MG/2ML IJ SOLN
4.0000 mg | Freq: Once | INTRAMUSCULAR | Status: AC
  Administered 2020-08-23: 4 mg via INTRAVENOUS
  Filled 2020-08-23: qty 2

## 2020-08-23 MED ORDER — TRAMADOL HCL 50 MG PO TABS: 50.0000 mg | ORAL_TABLET | Freq: Four times a day (QID) | ORAL | 0 refills | Status: DC | PRN

## 2020-08-23 NOTE — ED Provider Notes (Signed)
[facility] EMERGENCY DEPARTMENT Provider Note   CSN: [id number] Arrival date & time: 08/23/20  1655     History Chief Complaint  Patient presents with  . Weakness    Robin Fox is a 45 y.o. female.  The history is provided by the patient and medical records. No language interpreter was used.  Emesis Severity:  Moderate Duration:  2 weeks Timing:  Constant Quality:  Unable to specify Progression:  Unchanged Chronicity:  Chronic Relieved by:  Antiemetics Worsened by:  Nothing Ineffective treatments:  None tried Associated symptoms: abdominal pain, chills, cough and fever   Associated symptoms: no diarrhea, no headaches, no myalgias, no sore throat and no URI        Past Medical History:  Diagnosis Date  . Anemia    hx   . Gestational diabetes    Neg 2 hr test after preg  . Headache(784.0)   . Herpes genitalia    2013 - 1st outbreak  . Hypertension    2008  . Pre-eclampsia in third trimester   . Seasonal allergies   . Shortness of breath    w exertion  . Trichomonas   . Twins     Patient Active Problem List   Diagnosis Date Noted  . Reactive airway disease 08/14/2020  . Pancreatitis 08/13/2020  . Screening breast examination 03/30/2019  . Breast pain 03/30/2019  . Tobacco use 08/01/2016  . Routine postpartum follow-up 05/22/2012  . Mild or unspecified pre-eclampsia, with delivery 04/07/2012  . Abnormal maternal glucose tolerance, antepartum 03/27/2012  . History of IUGR (intrauterine growth retardation) and stillbirth, currently pregnant 02/12/2012  . History of stillbirth 12/12/2011  . Previous cesarean delivery, antepartum condition or complication 26/94/8546  . Obesity 11/21/2011  . Hx of herpes genitalis 11/21/2011  . Hypertension in pregnancy, essential, antepartum 11/09/2011  . Essential hypertension 11/09/2011  . AMA (advanced maternal age) multigravida 35+ 11/09/2011    Past Surgical History:  Procedure  Laterality Date  . CESAREAN SECTION     X 2  . CESAREAN SECTION  04/21/2012   Procedure: CESAREAN SECTION;  Surgeon: Woodroe Mode, MD;  Location: [facility];  Service: Obstetrics;  Laterality: N/A;  . TOOTH EXTRACTION       OB History    Gravida  4   Para  3   Term  1   Preterm  2   AB  0   Living  4     SAB  0   IAB  0   Ectopic  0   Multiple  2   Live Births  4           Family History  Problem Relation Age of Onset  . Hypertension Mother   . Stroke Mother   . Stroke Maternal Grandmother   . Heart disease Maternal Grandfather 60  . Hypertension Brother   . Diabetes Brother   . Stomach cancer Brother   . Hypertension Brother   . Diabetes Brother     Social History   Tobacco Use  . Smoking status: Former Smoker    Packs/day: 0.25    Years: 17.00    Pack years: 4.25    Types: Cigarettes    Quit date: 09/11/2019    Years since quitting: 0.9  . Smokeless tobacco: Never Used  Vaping Use  . Vaping Use: Never used  Substance Use Topics  . Alcohol use: Yes    Comment: socially  . Drug use: No  Home Medications Prior to Admission medications   Medication Sig Start Date End Date Taking? Authorizing Provider  acetaminophen (TYLENOL) 500 MG tablet Take 1,000 mg by mouth every 6 (Fox) hours as needed for mild pain or headache.    [provider]  albuterol (PROVENTIL) (2.5 MG/3ML) 0.083% nebulizer solution Take 3 mLs (2.5 mg total) by nebulization every 6 (Fox) hours as needed for wheezing or shortness of breath. 05/15/20   [name], NP  albuterol (VENTOLIN HFA) 108 (90 Base) MCG/ACT inhaler Inhale 1-2 puffs into the lungs every 6 (Fox) hours as needed for wheezing or shortness of breath. 05/05/20   Wieters, Hallie C, PA-C  alum & mag hydroxide-simeth (MAALOX/MYLANTA) 200-200-20 MG/5ML suspension Take 15 mLs by mouth every 6 (Fox) hours as needed for indigestion or heartburn. 08/11/20   Wieters, Hallie C, PA-C  famotidine (PEPCID) 20 MG  tablet Take 1 tablet (20 mg total) by mouth 2 (two) times daily as needed for heartburn or indigestion. 08/11/20   Wieters, Hallie C, PA-C  ferrous sulfate 325 (65 FE) MG tablet Take 325 mg by mouth daily with breakfast.    [provider]  HYDROcodone-acetaminophen (NORCO/VICODIN) 5-325 MG tablet Take 1 tablet by mouth 3 (three) times daily as needed for moderate pain or severe pain. 08/16/20   Lavina Hamman, MD  montelukast (SINGULAIR) 10 MG tablet Take 1 tablet (10 mg total) by mouth at bedtime. 06/26/20   [name], MD  Multiple Vitamin (MULTIVITAMIN) tablet Take 1 tablet by mouth daily.    [provider]  ondansetron (ZOFRAN ODT) 4 MG disintegrating tablet Take 1 tablet (4 mg total) by mouth every 8 (eight) hours as needed for nausea or vomiting. 08/11/20   Wieters, Hallie C, PA-C  valACYclovir (VALTREX) 500 MG tablet TAKE 1 TABLET BY MOUTH ONCE DAILY INCREASE  TO  TWICE  DAILY  FOR  3  DAYS  DURING  OUTBREAK Patient taking differently: Take 500 mg by mouth See admin instructions. Take one tablet  (500 mg) daily increase to twice daily for 3 days for outbreak 06/26/20   [name], MD    Allergies    Penicillins  Review of Systems   Review of Systems  Constitutional: Positive for chills, fatigue and fever. Negative for diaphoresis.  HENT: Negative for congestion and sore throat.   Eyes: Negative for visual disturbance.  Respiratory: Positive for cough. Negative for chest tightness, shortness of breath and wheezing.   Cardiovascular: Negative for chest pain, palpitations and leg swelling.  Gastrointestinal: Positive for abdominal pain, nausea and vomiting. Negative for constipation and diarrhea.  Genitourinary: Negative for dysuria, flank pain and frequency.  Musculoskeletal: Negative for back pain, myalgias, neck pain and neck stiffness.  Skin: Negative for rash and wound.  Neurological: Negative for dizziness, weakness, light-headedness, numbness and  headaches.  Psychiatric/Behavioral: Negative for agitation and confusion.  All other systems reviewed and are negative.   Physical Exam Updated Vital Signs BP (!) 135/96 (BP Location: Right Arm)   Pulse 91   Temp 99 F (37.2 C) (Oral)   Resp 12   LMP 08/14/2020 (Exact Date) Comment: period started tonight - not due at this time - light and black in appearance  SpO2 98%   Physical Exam Vitals and nursing note reviewed.  Constitutional:      General: She is not in acute distress.    Appearance: She is well-developed. She is not ill-appearing, toxic-appearing or diaphoretic.  HENT:     Head:  Normocephalic and atraumatic.     Nose: No congestion or rhinorrhea.     Mouth/Throat:     Mouth: Mucous membranes are dry.     Pharynx: No oropharyngeal exudate or posterior oropharyngeal erythema.  Eyes:     Extraocular Movements: Extraocular movements intact.     Conjunctiva/sclera: Conjunctivae normal.     Pupils: Pupils are equal, round, and reactive to light.  Cardiovascular:     Rate and Rhythm: Normal rate and regular rhythm.     Heart sounds: No murmur heard.   Pulmonary:     Effort: Pulmonary effort is normal. No respiratory distress.     Breath sounds: Normal breath sounds. No wheezing, rhonchi or rales.  Chest:     Chest wall: No tenderness.  Abdominal:     General: Abdomen is flat.     Palpations: Abdomen is soft.     Tenderness: There is no abdominal tenderness. There is no right CVA tenderness, left CVA tenderness or guarding.  Musculoskeletal:        General: No tenderness.     Cervical back: Neck supple. No tenderness.     Right lower leg: No edema.     Left lower leg: No edema.  Skin:    General: Skin is warm and dry.     Capillary Refill: Capillary refill takes less than 2 seconds.     Findings: No erythema.  Neurological:     General: No focal deficit present.     Mental Status: She is alert.  Psychiatric:        Mood and Affect: Mood normal.     ED  Results / Procedures / Treatments   Labs (all labs ordered are listed, but only abnormal results are displayed) Labs Reviewed  LIPASE, BLOOD - Abnormal; Notable for the following components:      Result Value   Lipase 185 (*)    All other components within normal limits  COMPREHENSIVE METABOLIC PANEL - Abnormal; Notable for the following components:   Sodium 133 (*)    Potassium 3.3 (*)    Chloride 92 (*)    Glucose, Bld 103 (*)    Creatinine, Ser 1.02 (*)    Total Protein 8.6 (*)    All other components within normal limits  CBC - Abnormal; Notable for the following components:   RBC 5.84 (*)    MCV 69.3 (*)    MCH 21.9 (*)    Platelets 463 (*)    All other components within normal limits  URINALYSIS, ROUTINE W REFLEX MICROSCOPIC - Abnormal; Notable for the following components:   APPearance CLOUDY (*)    Hgb urine dipstick SMALL (*)    Protein, ur 100 (*)    Bacteria, UA MANY (*)    All other components within normal limits  RESP PANEL BY RT-PCR (FLU A&B, COVID) ARPGX2  I-STAT BETA HCG BLOOD, ED ([facility], [facility], [facility] ONLY)    EKG EKG Interpretation  Date/Time:  Wednesday August 23 2020 17:01:38 EDT Ventricular Rate:  95 PR Interval:  140 QRS Duration: 80 QT Interval:  374 QTC Calculation: 469 R Axis:   -57 Text Interpretation: Normal sinus rhythm Left axis deviation Abnormal ECG When compared to prior, faste rate. No sTEMI Confirmed by Antony Blackbird [phone]) on 08/23/2020 8:00:39 PM   Radiology DG Chest 2 View  Result Date: 08/23/2020 CLINICAL DATA:  Cough and generalized weakness EXAM: CHEST - 2 VIEW COMPARISON:  07/03/2020, CT from 08/13/2020 FINDINGS: Cardiac shadow is within normal  limits. The lungs are well aerated bilaterally. Mild bibasilar atelectatic changes are seen without focal infiltrate or effusion. This is increased somewhat in the interval from the prior CT. IMPRESSION: Bibasilar atelectasis. Electronically Signed   By: Inez Catalina M.D.   On: 08/23/2020 21:56     Procedures Procedures    Medications Ordered in ED Medications  ondansetron (ZOFRAN) injection 4 mg (has no administration in time range)  sodium chloride 0.9 % bolus 1,000 mL (has no administration in time range)    ED Course  I have reviewed the triage vital signs and the nursing notes.  Pertinent labs & imaging results that were available during my care of the patient were reviewed by me and considered in my medical decision making (see chart for details).    MDM Rules/Calculators/A&P                          Robin Fox is a 45 y.o. female with a past medical history significant for hypertension, headaches, and recent pancreatitis who presents with mild abdominal cramping, nausea, chills, cough, and fatigue.  Patient reports that since she had pancreatitis recently, she has been eating and drinking less with some continued nausea and has had decreased stool output.  She is still passing gas and having bowel movements however.  She reports that some chills and has had a slight cough but is not having chest pain or shortness of breath.   On exam, abdomen is nontender.  Bowel sounds appreciated.  Lungs clear and chest is nontender.  Minimal wheezing.  Patient reports he has been using her inhalers at home.  No focal neurologic deficits.  Patient otherwise well-appearing.  Slightly warm to the touch.  Clinically I suspect patient is dehydrated still from her recent pancreatitis episode.  She says that she has still been having some nausea but is starting to tolerate eating and drink.  She reports she is try to not use the nausea medicine but is having difficulty eating more solid foods.  With her chills and cough, we will test her for COVID-19 and get a chest x-ray.  She had screening labs in triage which were overall similar to prior and reassuring.  Urinalysis does not show convincing evidence of infection.  Patient is likely dehydrated with some low electrolytes and similar  creatinine to prior.  Lipase is elevated but improved from the recent admission numbers.  No significant leukocytosis.  We discussed the possibly of getting repeat imaging however given her symptoms that are improved from last time and are more mild and her reassuring vital signs,  and her reassuring labs, we will hold on repeat CT imaging at this time.  Suspect she does not have worsening recurrent pancreatitis at this time.  Chest x-ray was obtained did not show pneumonia.  Covid swab ordered.  Patient was given fluids for rehydration and nausea medicine.  After this, will do a p.o. challenge.  If she passes and feels well, anticipate discharge home to continue her home nausea medicine more preventatively to help maintain hydration and follow-up with your PCP.  We will also give her prescription for some pain medicine to help with the likely mild residual pancreatitis is not as strong as the hydrocodone she could not tolerate.  Anticipate discharge shortly after rehydration.  She will follow up with the Covid test on MyChart.   Final Clinical Impression(s) / ED Diagnoses Final diagnoses:  Non-intractable vomiting with nausea, unspecified vomiting  type  Fatigue, unspecified type  Cough  Dehydration    Clinical Impression: 1. Non-intractable vomiting with nausea, unspecified vomiting type   2. Fatigue, unspecified type   3. Cough   4. Dehydration   5. Pain of upper abdomen     Disposition: Discharge  Condition: Good  I have discussed the results, Dx and Tx plan with the pt(& family if present). He/she/they expressed understanding and agree(s) with the plan. Discharge instructions discussed at great length. Strict return precautions discussed and pt &/or family have verbalized understanding of the instructions. No further questions at time of discharge.    New Prescriptions   TRAMADOL (ULTRAM) 50 MG TABLET    Take 1 tablet (50 mg total) by mouth every 6 (Fox) hours as needed.     Follow Up: [place] [id number] [phone] Schedule an appointment as soon as possible for a visit    [place] [address] [id number] mc [place]           Kila Godina, Gwenyth Allegra, MD 08/23/20 [phone]

## 2020-08-23 NOTE — ED Triage Notes (Signed)
Patient complains of generalized weakness since being discharged one week ago after an admission for pancreatitis. Patient alert, oriented, ambulatory, and in no apparent distress at this time.

## 2020-08-23 NOTE — Discharge Instructions (Signed)
Your work-up today was consistent with some mild dehydration likely related to some residual smoldering pancreatitis.  I suspect your body is taking a while to get back to normal eating patterns.  Please continue the liquid diet and gently start advancing her diet while using the nausea medicine.  Your x-ray did not show pneumonia however we did test you for Covid.  Please follow-up on this result.  Please rest and stay hydrated and use the new pain medicine to help treat your discomfort.  If any symptoms change or worsen, please return to the nearest emergency department.  Was already primary doctor.

## 2020-08-24 LAB — RESP PANEL BY RT-PCR (FLU A&B, COVID) ARPGX2
Influenza A by PCR: NEGATIVE
Influenza B by PCR: NEGATIVE
SARS Coronavirus 2 by RT PCR: NEGATIVE

## 2020-08-24 NOTE — ED Notes (Signed)
RN offered pt crackers and beverage, refused both.

## 2020-08-24 NOTE — ED Notes (Signed)
PT very upset, "I have not eaten since I was discharged.... nobody cares....they just left me here."  RN explained again the IV team was called to start IV due to difficult stick.   "Everybody has been disrespectful, you keep walking by me not making eye contact."  RN apologized if she seemed uncaring however there was a critical pt that needed by attention.

## 2020-08-28 ENCOUNTER — Ambulatory Visit (INDEPENDENT_AMBULATORY_CARE_PROVIDER_SITE_OTHER): Payer: 59 | Admitting: Nurse Practitioner

## 2020-08-28 ENCOUNTER — Encounter: Payer: Self-pay | Admitting: Nurse Practitioner

## 2020-08-28 ENCOUNTER — Other Ambulatory Visit: Payer: Self-pay

## 2020-08-28 VITALS — BP 126/78 | HR 97 | Temp 97.7°F | Ht 67.5 in | Wt 221.0 lb

## 2020-08-28 DIAGNOSIS — K859 Acute pancreatitis without necrosis or infection, unspecified: Secondary | ICD-10-CM | POA: Diagnosis not present

## 2020-08-28 DIAGNOSIS — R0609 Other forms of dyspnea: Secondary | ICD-10-CM

## 2020-08-28 DIAGNOSIS — E876 Hypokalemia: Secondary | ICD-10-CM

## 2020-08-28 DIAGNOSIS — R110 Nausea: Secondary | ICD-10-CM | POA: Diagnosis not present

## 2020-08-28 DIAGNOSIS — R0600 Dyspnea, unspecified: Secondary | ICD-10-CM

## 2020-08-28 DIAGNOSIS — R509 Fever, unspecified: Secondary | ICD-10-CM

## 2020-08-28 DIAGNOSIS — Z7689 Persons encountering health services in other specified circumstances: Secondary | ICD-10-CM

## 2020-08-28 LAB — POCT URINALYSIS DIPSTICK
Glucose, UA: NEGATIVE
Ketones, UA: NEGATIVE
Leukocytes, UA: NEGATIVE
Nitrite, UA: NEGATIVE
Protein, UA: NEGATIVE
Spec Grav, UA: >=1.03 — AB (ref 1.010–1.025)
Urobilinogen, UA: 2 E.U./dL — AB
pH, UA: 6 (ref 5.0–8.0)

## 2020-08-28 MED ORDER — FAMOTIDINE 20 MG PO TABS
20.0000 mg | ORAL_TABLET | Freq: Two times a day (BID) | ORAL | 0 refills | Status: DC | PRN
Start: 2020-08-28 — End: 2021-10-02

## 2020-08-28 MED ORDER — ONDANSETRON 4 MG PO TBDP: 4.0000 mg | ORAL_TABLET | Freq: Three times a day (TID) | ORAL | 0 refills | Status: DC | PRN

## 2020-08-28 NOTE — Patient Instructions (Addendum)
Health Maintenance, Female Adopting a healthy lifestyle and getting preventive care are important in promoting health and wellness. Ask your health care provider about:  The right schedule for you to have regular tests and exams.  Things you can do on your own to prevent diseases and keep yourself healthy. What should I know about diet, weight, and exercise? Eat a healthy diet  Eat a diet that includes plenty of vegetables, fruits, low-fat dairy products, and lean protein.  Do not eat a lot of foods that are high in solid fats, added sugars, or sodium.   Maintain a healthy weight Body mass index (BMI) is used to identify weight problems. It estimates body fat based on height and weight. Your health care provider can help determine your BMI and help you achieve or maintain a healthy weight. Get regular exercise Get regular exercise. This is one of the most important things you can do for your health. Most adults should:  Exercise for at least 150 minutes each week. The exercise should increase your heart rate and make you sweat (moderate-intensity exercise).  Do strengthening exercises at least twice a week. This is in addition to the moderate-intensity exercise.  Spend less time sitting. Even light physical activity can be beneficial. Watch cholesterol and blood lipids Have your blood tested for lipids and cholesterol at 44 years of age, then have this test every 5 years. Have your cholesterol levels checked more often if:  Your lipid or cholesterol levels are high.  You are older than 44 years of age.  You are at high risk for heart disease. What should I know about cancer screening? Depending on your health history and family history, you may need to have cancer screening at various ages. This may include screening for:  Breast cancer.  Cervical cancer.  Colorectal cancer.  Skin cancer.  Lung cancer. What should I know about heart disease, diabetes, and high blood  pressure? Blood pressure and heart disease  High blood pressure causes heart disease and increases the risk of stroke. This is more likely to develop in people who have high blood pressure readings, are of African descent, or are overweight.  Have your blood pressure checked: ? Every 3-5 years if you are 44-39 years of age. ? Every year if you are 40 years old or older. Diabetes Have regular diabetes screenings. This checks your fasting blood sugar level. Have the screening done:  Once every three years after age 44 if you are at a normal weight and have a low risk for diabetes.  More often and at a younger age if you are overweight or have a high risk for diabetes. What should I know about preventing infection? Hepatitis B If you have a higher risk for hepatitis B, you should be screened for this virus. Talk with your health care provider to find out if you are at risk for hepatitis B infection. Hepatitis C Testing is recommended for:  Everyone born from 1945 through 1965.  Anyone with known risk factors for hepatitis C. Sexually transmitted infections (STIs)  Get screened for STIs, including gonorrhea and chlamydia, if: ? You are sexually active and are younger than 44 years of age. ? You are older than 44 years of age and your health care provider tells you that you are at risk for this type of infection. ? Your sexual activity has changed since you were last screened, and you are at increased risk for chlamydia or gonorrhea. Ask your health care provider   if you are at risk.  Ask your health care provider about whether you are at high risk for HIV. Your health care provider may recommend a prescription medicine to help prevent HIV infection. If you choose to take medicine to prevent HIV, you should first get tested for HIV. You should then be tested every 3 months for as long as you are taking the medicine. Pregnancy  If you are about to stop having your period (premenopausal) and  you may become pregnant, seek counseling before you get pregnant.  Take 400 to 800 micrograms (mcg) of folic acid every day if you become pregnant.  Ask for birth control (contraception) if you want to prevent pregnancy. Osteoporosis and menopause Osteoporosis is a disease in which the bones lose minerals and strength with aging. This can result in bone fractures. If you are 44 years old or older, or if you are at risk for osteoporosis and fractures, ask your health care provider if you should:  Be screened for bone loss.  Take a calcium or vitamin D supplement to lower your risk of fractures.  Be given hormone replacement therapy (HRT) to treat symptoms of menopause. Follow these instructions at home: Lifestyle  Do not use any products that contain nicotine or tobacco, such as cigarettes, e-cigarettes, and chewing tobacco. If you need help quitting, ask your health care provider.  Do not use street drugs.  Do not share needles.  Ask your health care provider for help if you need support or information about quitting drugs. Alcohol use  Do not drink alcohol if: ? Your health care provider tells you not to drink. ? You are pregnant, may be pregnant, or are planning to become pregnant.  If you drink alcohol: ? Limit how much you use to 0-1 drink a day. ? Limit intake if you are breastfeeding.  Be aware of how much alcohol is in your drink. In the [place] one drink equals one 12 oz bottle of beer (355 mL), one 5 oz glass of wine (148 mL), or one 1 oz glass of hard liquor (44 mL). General instructions  Schedule regular health, dental, and eye exams.  Stay current with your vaccines.  Tell your health care provider if: ? You often feel depressed. ? You have ever been abused or do not feel safe at home. Summary  Adopting a healthy lifestyle and getting preventive care are important in promoting health and wellness.  Follow your health care provider's instructions about healthy  diet, exercising, and getting tested or screened for diseases.  Follow your health care provider's instructions on monitoring your cholesterol and blood pressure. This information is not intended to replace advice given to you by your health care provider. Make sure you discuss any questions you have with your health care provider. Document Revised: 05/06/2018 Document Reviewed: 05/06/2018 Elsevier Patient Education  2021 [place].   Acute Pancreatitis  Acute pancreatitis happens when the pancreas gets swollen. The pancreas is a large gland in the body that helps to control blood sugar. It also makes enzymes that help to digest food. This condition can last a few days and cause serious problems. The lungs, heart, and kidneys may stop working. What are the causes? Causes include:  Alcohol abuse.  Drug abuse.  Gallstones.  A tumor in the pancreas. Other causes include:  Some medicines.  Some chemicals.  Diabetes.  An infection.  Damage caused by an accident.  The poison (venom) from a scorpion bite.  Belly (abdominal) surgery.  The body's defense system (immune system) attacking the pancreas (autoimmune pancreatitis).  Genes that are passed from parent to child (inherited). In some cases, the cause is not known. What are the signs or symptoms?  Pain in the upper belly that may be felt in the back. The pain may be very bad.  Swelling of the belly.  Feeling sick to your stomach (nauseous) and throwing up (vomiting).  Fever. How is this treated? You will likely have to stay in the hospital. Treatment may include:  Pain medicine.  Fluid through an IV tube.  Placing a tube in the stomach to take out the stomach contents. This may help you stop throwing up.  Not eating for 3-4 days.  Antibiotic medicines, if you have an infection.  Treating any other problems that may be the cause.  Steroid medicines, if your problem is caused by your defense system  attacking your body's own tissues.  Surgery. Follow these instructions at home: Eating and drinking  Follow instructions from your doctor about what to eat and drink.  Eat foods that do not have a lot of fat in them.  Eat small meals often. Do not eat big meals.  Drink enough fluid to keep your pee (urine) pale yellow.  Do not drink alcohol if it caused your condition.   Medicines  Take over-the-counter and prescription medicines only as told by your doctor.  Ask your doctor if the medicine prescribed to you: ? Requires you to avoid driving or using heavy machinery. ? Can cause trouble pooping (constipation). You may need to take steps to prevent or treat trouble pooping:  Take over-the-counter or prescription medicines.  Eat foods that are high in fiber. These include beans, whole grains, and fresh fruits and vegetables.  Limit foods that are high in fat and sugar. These include fried or sweet foods. General instructions  Do not use any products that contain nicotine or tobacco, such as cigarettes, e-cigarettes, and chewing tobacco. If you need help quitting, ask your doctor.  Get plenty of rest.  Check your blood sugar at home as told by your doctor.  Keep all follow-up visits as told by your doctor. This is important. Contact a doctor if:  You do not get better as quickly as expected.  You have new symptoms.  Your symptoms get worse.  You have pain or weakness that lasts a long time.  You keep feeling sick to your stomach.  You get better and then you have pain again.  You have a fever. Get help right away if:  You cannot eat or keep fluids down.  Your pain gets very bad.  Your skin or the white part of your eyes turns yellow.  You have sudden swelling in your belly.  You throw up.  You feel dizzy or you pass out (faint).  Your blood sugar is high (over 300 mg/dL). Summary  Acute pancreatitis happens when the pancreas gets swollen.  This  condition is often caused by alcohol abuse, drug abuse, or gallstones.  You will likely have to stay in the hospital for treatment. This information is not intended to replace advice given to you by your health care provider. Make sure you discuss any questions you have with your health care provider. Document Revised: 03/02/2018 Document Reviewed: 03/02/2018 Elsevier Patient Education  2021 [place].   [email or web address]">  Depression Screening Depression screening is a tool that your health care provider can use to learn if you have symptoms of depression. Depression  is a common condition with many symptoms that are also often found in other conditions. Depression is treatable, but it must first be diagnosed. You may not know that certain feelings, thoughts, and behaviors that you are having can be symptoms of depression. Taking a depression screening test can help you and your health care provider decide if you need more assessment, or if you should be referred to a mental health care provider. What are the screening tests?  You may have a physical exam to see if another condition is affecting your mental health. You may have a blood or urine sample taken during the physical exam.  You may be interviewed using a screening tool that was developed from research, such as one of these: ? Patient Health Questionnaire (PHQ). This is a set of either 2 or 9 questions. A health care provider who has been trained to score this screening test uses a guide to assess if your symptoms suggest that you may have depression. ? Hamilton Depression Rating Scale (HAM-D). This is a set of either 17 or 24 questions. You may be asked to take it again during or after your treatment, to see if your depression has gotten better. ? Beck Depression Inventory (BDI). This is a set of 21 multiple choice questions. Your health care provider scores your  answers to assess:  Your level of depression, ranging from mild to severe.  Your response to treatment.  Your health care provider may talk with you about your daily activities, such as eating, sleeping, work, and recreation, and ask if you have had any changes in activity.  Your health care provider may ask you to see a mental health specialist, such as a psychiatrist or psychologist, for more evaluation. Who should be screened for depression?  All adults, including adults with a family history of a mental health disorder.  Adolescents who are 52-42 years old.  People who are recovering from a myocardial infarction (MI).  Pregnant women, or women who have given birth.  People who have a long-term (chronic) illness.  Anyone who has been diagnosed with another type of a mental health disorder.  Anyone who has symptoms that could show depression.   What do my results mean? Your health care provider will review the results of your depression screening, physical exam, and lab tests. Positive screens suggest that you may have depression. Screening is the first step in getting the care that you may need. It is up to you to get your screening results. Ask your health care provider, or the department that is doing your screening tests, when your results will be ready. Talk with your health care provider about your results and diagnosis. A diagnosis of depression is made using the Diagnostic and Statistical Manual of Mental Disorders (DSM-V). This is a book that lists the number and type of symptoms that must be present for a health care provider to give a specific diagnosis.  Your health care provider may work with you to treat your symptoms of depression, or your health care provider may help you find a mental health provider who can assess, diagnose, and treat your depression. Get help right away if:  You have thoughts about hurting yourself or others. If you ever feel like you may hurt  yourself or others, or have thoughts about taking your own life, get help right away. You can go to your nearest emergency department or call:  Your local emergency services (911 in the [place]  A suicide  crisis helpline, such as the [facility] at 432 666 0855. This is open 24 hours a day. Summary  Depression screening is the first step in getting the help that you may need.  If your screening test shows symptoms of depression (is positive), your health care provider may ask you to see a mental health provider.  Anyone who is age 54 or older should be screened for depression. This information is not intended to replace advice given to you by your health care provider. Make sure you discuss any questions you have with your health care provider. Document Revised: 11/04/2019 Document Reviewed: 11/04/2019 Elsevier Patient Education  [place].

## 2020-08-28 NOTE — Progress Notes (Signed)
Robin Fox, [place]  [id number] Phone:  [phone]   Fax:  [phone]   New Patient Office Visit  Subjective:  Patient ID: Robin Fox, female    DOB: 04-Aug-1975  Age: 45 y.o. MRN: [id number]  CC:  Chief Complaint  Patient presents with  . New Patient (Initial Visit)    D/c from hospital for  Pancreatitis, having, sob, headaches, trouble sleeps, running low grade fever.    HPI Robin Fox presents to establish care. She was 30 minutes late for her appointment. She  has a past medical history of Anemia, Gestational diabetes, Headache(784.0), Herpes genitalia, Hypertension, Pre-eclampsia in third trimester, Seasonal allergies, Shortness of breath, Trichomonas, and Twins.  She admits that she was followed by primary care over the last several years ,their office closed.  She admits to being treated for bronchitis and was on albuterol inhaler and nebulizer.  There is a previous diagnosis pneumonia to right lower lobe December 2021.  She has multiple visit to the ED for various reasons from January to March.  On 08/13/2020 she was admitted and treated for pancreatitis. Lipase >1000.  This has improved last lipase was 186. She admits that she did have ETOH prior to this episode after first denying this as being a potential cause.  She denies any binge drinking.  She admits that she continues to have weakness with shortness of breath .She admits that she dyspnea on exertion which is worse with walking up steps.    She has dizziness and headache with decreased appetite. She nausea no vomiting and elevated temp 102 4 days ago.She is concerned about her weight loss of about 10 pounds  She has decreased energy level unable to ADL's.    She is a former smoker however stopped approximately 6 months ago.  She denies any vaping or use any other forms of inhalation.  She denies any palpitations or irregular heartbeat.  Past Medical  History:  Diagnosis Date  . Anemia    hx   . Gestational diabetes    Neg 2 hr test after preg  . Headache(784.0)   . Herpes genitalia    2013 - 1st outbreak  . Hypertension    2008  . Pre-eclampsia in third trimester   . Seasonal allergies   . Shortness of breath    w exertion  . Trichomonas   . Twins     Past Surgical History:  Procedure Laterality Date  . CESAREAN SECTION     X 2  . CESAREAN SECTION  04/21/2012   Procedure: CESAREAN SECTION;  Surgeon: Woodroe Mode, MD;  Location: [facility];  Service: Obstetrics;  Laterality: N/A;  . TOOTH EXTRACTION      Family History  Problem Relation Age of Onset  . Hypertension Mother   . Stroke Mother   . Stroke Maternal Grandmother   . Heart disease Maternal Grandfather 60  . Hypertension Brother   . Diabetes Brother   . Stomach cancer Brother   . Hypertension Brother   . Diabetes Brother     Social History   Socioeconomic History  . Marital status: Married    Spouse name: Not on file  . Number of children: Not on file  . Years of education: Not on file  . Highest education level: Not on file  Occupational History  . Not on file  Tobacco Use  . Smoking status: Former Smoker    Packs/day: 0.25  Years: 17.00    Pack years: 4.25    Types: Cigarettes    Quit date: 09/11/2019    Years since quitting: 0.9  . Smokeless tobacco: Never Used  Vaping Use  . Vaping Use: Never used  Substance and Sexual Activity  . Alcohol use: Not Currently    Comment: socially  . Drug use: No  . Sexual activity: Yes    Birth control/protection: Other-see comments, None    Comment: Partner had vasectomy-1st intercourse 78 yo-More than 5 partners  Other Topics Concern  . Not on file  Social History Narrative  . Not on file   Social Determinants of Health   Financial Resource Strain: Not on file  Food Insecurity: No Food Insecurity  . Worried About Charity fundraiser in the Last Year: Never true  . Ran Out of Food in the Last  Year: Never true  Transportation Needs: No Transportation Needs  . Lack of Transportation (Medical): No  . Lack of Transportation (Non-Medical): No  Physical Activity: Not on file  Stress: Not on file  Social Connections: Not on file  Intimate Partner Violence: Not on file    ROS Review of Systems  Constitutional: Positive for appetite change, fatigue and unexpected weight change.  Respiratory: Positive for cough (little ), shortness of breath (She was has many year since 53 >1 PPD ) and wheezing. Negative for chest tightness.   Cardiovascular: Positive for chest pain. Negative for palpitations.       Dyspnea on exertion it takes her awhile to check her breath  Gastrointestinal: Positive for constipation (no bowel movement in 4-5 days no tx) and nausea. Negative for diarrhea and vomiting.  Endocrine: Negative.   Genitourinary: Positive for frequency. Negative for difficulty urinating and dysuria.  Allergic/Immunologic: Positive for environmental allergies (pollen using singular for a few weeks).  Neurological: Positive for dizziness (from seating to standing) and headaches (continued since ).    Objective:   Today's Vitals: BP 126/78 (BP Location: Left Arm, Patient Position: Sitting, Cuff Size: Large)   Pulse 97   Temp 97.7 F (36.5 C) (Temporal)   Ht 5' 7.5" (1.715 m)   Wt 221 lb (100.2 kg)   LMP 08/14/2020 (Exact Date) Comment: period started tonight - not due at this time - light and black in appearance  SpO2 95%   BMI 34.10 kg/m   Physical Exam Constitutional:      General: She is not in acute distress.    Appearance: She is not ill-appearing, toxic-appearing or diaphoretic.  HENT:     Head: Normocephalic and atraumatic.     Nose: Nose normal.     Mouth/Throat:     Mouth: Mucous membranes are moist.  Cardiovascular:     Pulses: Normal pulses.  Pulmonary:     Effort: Pulmonary effort is normal.     Breath sounds: No stridor. No wheezing, rhonchi or rales.      Comments: Diminished Abdominal:     General: Bowel sounds are normal.     Palpations: Abdomen is soft.  Musculoskeletal:        General: Normal range of motion.  Skin:    General: Skin is warm and dry.     Capillary Refill: Capillary refill takes less than 2 seconds.  Neurological:     General: No focal deficit present.     Mental Status: She is alert and oriented to person, place, and time.  Psychiatric:     Comments: Concern about current illness no  history of depression     Assessment & Plan:   Problem List Items Addressed This Visit      Digestive   Pancreatitis - Primary Improving continue to have symptoms as stated above refill on ondansetron and famotidine Encourage patient if symptoms persist with no improvement to follow-up with phone call for referral to gastroenterology for additional work-up   Relevant Medications   famotidine (PEPCID) 20 MG tablet   Other Relevant Orders   POCT Urinalysis Dipstick    Other Visit Diagnoses    Encounter to establish care     Discussed female health maintenance; SBE, annual CBE, PAP test Discussed general safety in vehicle and COVID Discussed regular hydration with water Discussed healthy diet and exercise and weight management Discussed sexual health  Discussed mental health Encouraged to call our office for an appointment with in ongoing concerns for questions.  Only   Relevant Orders   POCT Urinalysis Dipstick   Low serum potassium       Relevant Orders   POCT Urinalysis Dipstick   Comp. Metabolic Panel (12)   Nausea     Ongoing further evaluation and ondansetron.   Relevant Orders   POCT Urinalysis Dipstick   CBC with Differential/Platelet   H. pylori breath test   Fever, unspecified fever cause       Relevant Orders   POCT Urinalysis Dipstick   CBC with Differential/Platelet   Dyspnea on exertion     Persistent with history of pneumonia and bronchitis referral to pulmonology for further pulmonary function testing    Relevant Orders   Ambulatory referral to Pulmonology      Outpatient Encounter Medications as of 08/28/2020  Medication Sig  . acetaminophen (TYLENOL) 500 MG tablet Take 1,000 mg by mouth every 6 (six) hours as needed for mild pain or headache.  . albuterol (PROVENTIL) (2.5 MG/3ML) 0.083% nebulizer solution Take 3 mLs (2.5 mg total) by nebulization every 6 (six) hours as needed for wheezing or shortness of breath.  Marland Kitchen albuterol (VENTOLIN HFA) 108 (90 Base) MCG/ACT inhaler Inhale 1-2 puffs into the lungs every 6 (six) hours as needed for wheezing or shortness of breath.  Marland Kitchen alum & mag hydroxide-simeth (MAALOX/MYLANTA) 200-200-20 MG/5ML suspension Take 15 mLs by mouth every 6 (six) hours as needed for indigestion or heartburn.  . ferrous sulfate 325 (65 FE) MG tablet Take 325 mg by mouth daily with breakfast.  . montelukast (SINGULAIR) 10 MG tablet Take 1 tablet (10 mg total) by mouth at bedtime.  . Multiple Vitamin (MULTIVITAMIN) tablet Take 1 tablet by mouth daily.  . traMADol (ULTRAM) 50 MG tablet Take 1 tablet (50 mg total) by mouth every 6 (six) hours as needed.  . valACYclovir (VALTREX) 500 MG tablet TAKE 1 TABLET BY MOUTH ONCE DAILY INCREASE  TO  TWICE  DAILY  FOR  3  DAYS  DURING  OUTBREAK (Patient taking differently: Take 500 mg by mouth See admin instructions. Take one tablet  (500 mg) daily increase to twice daily for 3 days for outbreak)  . [DISCONTINUED] famotidine (PEPCID) 20 MG tablet Take 1 tablet (20 mg total) by mouth 2 (two) times daily as needed for heartburn or indigestion.  . famotidine (PEPCID) 20 MG tablet Take 1 tablet (20 mg total) by mouth 2 (two) times daily as needed for heartburn or indigestion.  Marland Kitchen HYDROcodone-acetaminophen (NORCO/VICODIN) 5-325 MG tablet Take 1 tablet by mouth 3 (three) times daily as needed for moderate pain or severe pain. (Patient not taking: Reported on  08/28/2020)  . ondansetron (ZOFRAN ODT) 4 MG disintegrating tablet Take 1 tablet (4 mg total) by  mouth every 8 (eight) hours as needed for nausea or vomiting.  . [DISCONTINUED] ondansetron (ZOFRAN ODT) 4 MG disintegrating tablet Take 1 tablet (4 mg total) by mouth every 8 (eight) hours as needed for nausea or vomiting.   No facility-administered encounter medications on file as of 08/28/2020.    Follow-up: Return in 6 months (on 02/27/2021).   Vevelyn Francois, NP

## 2020-08-29 LAB — CBC WITH DIFFERENTIAL/PLATELET
Basophils Absolute: 0.1 10*3/uL (ref 0.0–0.2)
Basos: 1 %
EOS (ABSOLUTE): 0.2 10*3/uL (ref 0.0–0.4)
Eos: 3 %
Hematocrit: 39.3 % (ref 34.0–46.6)
Hemoglobin: 12.7 g/dL (ref 11.1–15.9)
Immature Grans (Abs): 0 10*3/uL (ref 0.0–0.1)
Immature Granulocytes: 0 %
Lymphocytes Absolute: 1.4 10*3/uL (ref 0.7–3.1)
Lymphs: 16 %
MCH: 21.7 pg — ABNORMAL LOW (ref 26.6–33.0)
MCHC: 32.3 g/dL (ref 31.5–35.7)
MCV: 67 fL — ABNORMAL LOW (ref 79–97)
Monocytes Absolute: 0.7 10*3/uL (ref 0.1–0.9)
Monocytes: 9 %
Neutrophils Absolute: 6.3 10*3/uL (ref 1.4–7.0)
Neutrophils: 71 %
Platelets: 525 10*3/uL — ABNORMAL HIGH (ref 150–450)
RBC: 5.85 x10E6/uL — ABNORMAL HIGH (ref 3.77–5.28)
RDW: 13.7 % (ref 11.7–15.4)
WBC: 8.7 10*3/uL (ref 3.4–10.8)

## 2020-08-29 LAB — COMP. METABOLIC PANEL (12)
AST: 22 IU/L (ref 0–40)
Albumin/Globulin Ratio: 1.3 (ref 1.2–2.2)
Albumin: 4.5 g/dL (ref 3.8–4.8)
Alkaline Phosphatase: 98 IU/L (ref 44–121)
BUN/Creatinine Ratio: 9 (ref 9–23)
BUN: 9 mg/dL (ref 6–24)
Bilirubin Total: 0.5 mg/dL (ref 0.0–1.2)
Calcium: 9.7 mg/dL (ref 8.7–10.2)
Chloride: 90 mmol/L — ABNORMAL LOW (ref 96–106)
Creatinine, Ser: 1.02 mg/dL — ABNORMAL HIGH (ref 0.57–1.00)
Globulin, Total: 3.6 g/dL (ref 1.5–4.5)
Glucose: 123 mg/dL — ABNORMAL HIGH (ref 65–99)
Potassium: 3.3 mmol/L — ABNORMAL LOW (ref 3.5–5.2)
Sodium: 136 mmol/L (ref 134–144)
Total Protein: 8.1 g/dL (ref 6.0–8.5)
eGFR: 70 mL/min/{1.73_m2} (ref >59)

## 2020-08-30 LAB — H. PYLORI BREATH TEST: H pylori Breath Test: NEGATIVE

## 2020-08-31 ENCOUNTER — Telehealth: Payer: Self-pay

## 2020-08-31 NOTE — Telephone Encounter (Signed)
Advised patient to contact pulmonary or go to hospital if breathing gets worse.

## 2020-08-31 NOTE — Telephone Encounter (Signed)
Pt said that she was supposed to be referred to pulmonologist   She is still have short of breath

## 2020-09-18 ENCOUNTER — Other Ambulatory Visit: Payer: Self-pay

## 2020-09-18 ENCOUNTER — Other Ambulatory Visit: Payer: 59

## 2020-09-18 DIAGNOSIS — R0609 Other forms of dyspnea: Secondary | ICD-10-CM

## 2020-09-18 DIAGNOSIS — O09521 Supervision of elderly multigravida, first trimester: Secondary | ICD-10-CM

## 2020-09-18 DIAGNOSIS — R0600 Dyspnea, unspecified: Secondary | ICD-10-CM

## 2020-09-18 DIAGNOSIS — I10 Essential (primary) hypertension: Secondary | ICD-10-CM

## 2020-09-18 DIAGNOSIS — K8500 Idiopathic acute pancreatitis without necrosis or infection: Secondary | ICD-10-CM

## 2020-09-19 ENCOUNTER — Other Ambulatory Visit: Payer: Self-pay

## 2020-09-19 ENCOUNTER — Other Ambulatory Visit: Payer: 59

## 2020-09-19 ENCOUNTER — Telehealth: Payer: Self-pay

## 2020-09-19 DIAGNOSIS — K8500 Idiopathic acute pancreatitis without necrosis or infection: Secondary | ICD-10-CM

## 2020-09-19 DIAGNOSIS — R0600 Dyspnea, unspecified: Secondary | ICD-10-CM

## 2020-09-19 DIAGNOSIS — O09521 Supervision of elderly multigravida, first trimester: Secondary | ICD-10-CM

## 2020-09-19 DIAGNOSIS — R0609 Other forms of dyspnea: Secondary | ICD-10-CM

## 2020-09-19 DIAGNOSIS — I10 Essential (primary) hypertension: Secondary | ICD-10-CM

## 2020-09-22 NOTE — Telephone Encounter (Signed)
error 

## 2020-09-23 LAB — CBC WITH DIFFERENTIAL/PLATELET
Basophils Absolute: 0 10*3/uL (ref 0.0–0.2)
Basos: 1 %
EOS (ABSOLUTE): 0.2 10*3/uL (ref 0.0–0.4)
Eos: 4 %
Hematocrit: 39.3 % (ref 34.0–46.6)
Hemoglobin: 12.2 g/dL (ref 11.1–15.9)
Immature Grans (Abs): 0 10*3/uL (ref 0.0–0.1)
Immature Granulocytes: 0 %
Lymphocytes Absolute: 1.4 10*3/uL (ref 0.7–3.1)
Lymphs: 25 %
MCH: 21.2 pg — ABNORMAL LOW (ref 26.6–33.0)
MCHC: 31 g/dL — ABNORMAL LOW (ref 31.5–35.7)
MCV: 68 fL — ABNORMAL LOW (ref 79–97)
Monocytes Absolute: 0.4 10*3/uL (ref 0.1–0.9)
Monocytes: 7 %
Neutrophils Absolute: 3.7 10*3/uL (ref 1.4–7.0)
Neutrophils: 63 %
Platelets: 481 10*3/uL — ABNORMAL HIGH (ref 150–450)
RBC: 5.76 x10E6/uL — ABNORMAL HIGH (ref 3.77–5.28)
RDW: 16.4 % — ABNORMAL HIGH (ref 11.7–15.4)
WBC: 5.7 10*3/uL (ref 3.4–10.8)

## 2020-09-23 LAB — ANGIOTENSIN CONVERTING ENZYME: Angio Convert Enzyme: 44 U/L (ref 14–82)

## 2020-09-23 LAB — RHEUMATOID FACTOR: Rheumatoid fact SerPl-aCnc: 15.9 IU/mL — ABNORMAL HIGH (ref <14.0)

## 2020-09-23 LAB — FLUORESCENT TREPONEMAL AB(FTA)-IGG-BLD: Fluorescent Treponemal Ab, IgG: NONREACTIVE

## 2020-09-23 LAB — ANA: Anti Nuclear Antibody (ANA): NEGATIVE

## 2020-09-23 LAB — HLA-B27 ANTIGEN: HLA B27: NEGATIVE

## 2020-09-29 ENCOUNTER — Institutional Professional Consult (permissible substitution): Payer: 59 | Admitting: Pulmonary Disease

## 2020-10-22 ENCOUNTER — Encounter: Payer: Self-pay | Admitting: Emergency Medicine

## 2020-10-22 ENCOUNTER — Ambulatory Visit
Admission: EM | Admit: 2020-10-22 | Discharge: 2020-10-22 | Disposition: A | Discharge status: Home / Self Care | Payer: 59 | Attending: Emergency Medicine | Admitting: Emergency Medicine

## 2020-10-22 ENCOUNTER — Other Ambulatory Visit: Payer: Self-pay

## 2020-10-22 DIAGNOSIS — N760 Acute vaginitis: Secondary | ICD-10-CM | POA: Insufficient documentation

## 2020-10-22 LAB — POCT URINALYSIS DIP (MANUAL ENTRY)
Bilirubin, UA: NEGATIVE
Glucose, UA: NEGATIVE mg/dL
Ketones, POC UA: NEGATIVE mg/dL
Leukocytes, UA: NEGATIVE
Nitrite, UA: NEGATIVE
Protein Ur, POC: NEGATIVE mg/dL
Spec Grav, UA: 1.02 (ref 1.010–1.025)
Urobilinogen, UA: 0.2 E.U./dL
pH, UA: 6.5 (ref 5.0–8.0)

## 2020-10-22 MED ORDER — FLUCONAZOLE 150 MG PO TABS: 150.0000 mg | ORAL_TABLET | Freq: Once | ORAL | 0 refills | Status: AC

## 2020-10-22 MED ORDER — METRONIDAZOLE 500 MG PO TABS: 500.0000 mg | ORAL_TABLET | Freq: Two times a day (BID) | ORAL | 0 refills | Status: AC

## 2020-10-22 NOTE — Discharge Instructions (Signed)
Metronidazole twice daily for 1 week, no alcohol while taking Diflucan once, repeat in 72 hours if not improving  We are testing you for Gonorrhea, Chlamydia, Trichomonas, Yeast and Bacterial Vaginosis. We will call you if anything is positive and let you know if you require any further treatment. Please inform partners of any positive results.   Please return if symptoms not improving with treatment, development of fever, nausea, vomiting, abdominal pain.

## 2020-10-22 NOTE — ED Provider Notes (Signed)
[name]    CSN: [id number] Arrival date & time: 10/22/20  1322      History   Chief Complaint Chief Complaint  Patient presents with  . Urinary Frequency  . Vaginal Itching    HPI Robin Fox is a 45 y.o. female presenting today for evaluation of urinary frequency and vaginal itching.  Reports that for the past few days she has developed abdominal discomfort and pelvic area with associated urinary frequency, incomplete voiding and urgency.  Reports itching and irritation in labial area.  Reports history of both yeast and BV and feels slightly similar.  Expresses concern over recurrent BV as she has had 2 infections in the past 6 months.  HPI  Past Medical History:  Diagnosis Date  . Anemia    hx   . Gestational diabetes    Neg 2 hr test after preg  . Headache(784.0)   . Herpes genitalia    2013 - 1st outbreak  . Hypertension    2008  . Pre-eclampsia in third trimester   . Seasonal allergies   . Shortness of breath    w exertion  . Trichomonas   . Twins     Patient Active Problem List   Diagnosis Date Noted  . Reactive airway disease 08/14/2020  . Pancreatitis 08/13/2020  . Screening breast examination 03/30/2019  . Breast pain 03/30/2019  . Tobacco use 08/01/2016  . Routine postpartum follow-up 05/22/2012  . Mild or unspecified pre-eclampsia, with delivery 04/07/2012  . Abnormal maternal glucose tolerance, antepartum 03/27/2012  . History of IUGR (intrauterine growth retardation) and stillbirth, currently pregnant 02/12/2012  . History of stillbirth 12/12/2011  . Previous cesarean delivery, antepartum condition or complication 61/60/7371  . Obesity 11/21/2011  . Hx of herpes genitalis 11/21/2011  . Hypertension in pregnancy, essential, antepartum 11/09/2011  . Essential hypertension 11/09/2011  . AMA (advanced maternal age) multigravida 35+ 11/09/2011    Past Surgical History:  Procedure Laterality Date  . CESAREAN SECTION      X 2  . CESAREAN SECTION  04/21/2012   Procedure: CESAREAN SECTION;  Surgeon: Woodroe Mode, MD;  Location: [facility];  Service: Obstetrics;  Laterality: N/A;  . TOOTH EXTRACTION      OB History    Gravida  4   Para  3   Term  1   Preterm  2   AB  0   Living  4     SAB  0   IAB  0   Ectopic  0   Multiple  2   Live Births  4            Home Medications    Prior to Admission medications   Medication Sig Start Date End Date Taking? Authorizing Provider  fluconazole (DIFLUCAN) 150 MG tablet Take 1 tablet (150 mg total) by mouth once for 1 dose. 10/22/20 10/22/20 Yes Danity Schmelzer C, PA-C  metroNIDAZOLE (FLAGYL) 500 MG tablet Take 1 tablet (500 mg total) by mouth 2 (two) times daily for 7 days. 10/22/20 10/29/20 Yes Darnell Jeschke C, PA-C  acetaminophen (TYLENOL) 500 MG tablet Take 1,000 mg by mouth every 6 (six) hours as needed for mild pain or headache.    [provider]  albuterol (PROVENTIL) (2.5 MG/3ML) 0.083% nebulizer solution Take 3 mLs (2.5 mg total) by nebulization every 6 (six) hours as needed for wheezing or shortness of breath. 05/15/20   [name], NP  albuterol (VENTOLIN HFA) 108 (90 Base) MCG/ACT inhaler  Inhale 1-2 puffs into the lungs every 6 (six) hours as needed for wheezing or shortness of breath. 05/05/20   Briseyda Fehr C, PA-C  alum & mag hydroxide-simeth (MAALOX/MYLANTA) 200-200-20 MG/5ML suspension Take 15 mLs by mouth every 6 (six) hours as needed for indigestion or heartburn. 08/11/20   Rahm Minix C, PA-C  famotidine (PEPCID) 20 MG tablet Take 1 tablet (20 mg total) by mouth 2 (two) times daily as needed for heartburn or indigestion. 08/28/20   Vevelyn Francois, NP  ferrous sulfate 325 (65 FE) MG tablet Take 325 mg by mouth daily with breakfast.    [provider]  montelukast (SINGULAIR) 10 MG tablet Take 1 tablet (10 mg total) by mouth at bedtime. 06/26/20   [name], MD  Multiple Vitamin (MULTIVITAMIN) tablet  Take 1 tablet by mouth daily.    [provider]  ondansetron (ZOFRAN ODT) 4 MG disintegrating tablet Take 1 tablet (4 mg total) by mouth every 8 (eight) hours as needed for nausea or vomiting. 08/28/20   Vevelyn Francois, NP  traMADol (ULTRAM) 50 MG tablet Take 1 tablet (50 mg total) by mouth every 6 (six) hours as needed. 08/23/20   Tegeler, Gwenyth Allegra, MD  valACYclovir (VALTREX) 500 MG tablet TAKE 1 TABLET BY MOUTH ONCE DAILY INCREASE  TO  TWICE  DAILY  FOR  3  DAYS  DURING  OUTBREAK Patient taking differently: Take 500 mg by mouth See admin instructions. Take one tablet  (500 mg) daily increase to twice daily for 3 days for outbreak 06/26/20   [name], MD    Family History Family History  Problem Relation Age of Onset  . Hypertension Mother   . Stroke Mother   . Stroke Maternal Grandmother   . Heart disease Maternal Grandfather 60  . Hypertension Brother   . Diabetes Brother   . Stomach cancer Brother   . Hypertension Brother   . Diabetes Brother     Social History Social History   Tobacco Use  . Smoking status: Former Smoker    Packs/day: 0.25    Years: 17.00    Pack years: 4.25    Types: Cigarettes    Quit date: 09/11/2019    Years since quitting: 1.1  . Smokeless tobacco: Never Used  Vaping Use  . Vaping Use: Never used  Substance Use Topics  . Alcohol use: Not Currently    Comment: socially  . Drug use: No     Allergies   Penicillins   Review of Systems Review of Systems  Constitutional: Negative for fever.  Respiratory: Negative for shortness of breath.   Cardiovascular: Negative for chest pain.  Gastrointestinal: Negative for abdominal pain, diarrhea, nausea and vomiting.  Genitourinary: Negative for dysuria, flank pain, genital sores, hematuria, menstrual problem, vaginal bleeding, vaginal discharge and vaginal pain.  Musculoskeletal: Negative for back pain.  Skin: Negative for rash.  Neurological: Negative for dizziness,  light-headedness and headaches.     Physical Exam Triage Vital Signs ED Triage Vitals  Enc Vitals Group     BP 10/22/20 1454 (!) 131/92     Pulse Rate 10/22/20 1454 96     Resp 10/22/20 1454 16     Temp 10/22/20 1454 97.6 F (36.4 C)     Temp Source 10/22/20 1454 Oral     SpO2 10/22/20 1454 100 %     Weight --      Height --      Head Circumference --  Peak Flow --      Pain Score 10/22/20 1452 4     Pain Loc --      Pain Edu? --      Excl. in [place]? --    No data found.  Updated Vital Signs BP (!) 131/92 (BP Location: Right Arm)   Pulse 96   Temp 97.6 F (36.4 C) (Oral)   Resp 16   SpO2 100%   Visual Acuity Right Eye Distance:   Left Eye Distance:   Bilateral Distance:    Right Eye Near:   Left Eye Near:    Bilateral Near:     Physical Exam Vitals and nursing note reviewed.  Constitutional:      Appearance: She is well-developed.     Comments: No acute distress  HENT:     Head: Normocephalic and atraumatic.     Nose: Nose normal.  Eyes:     Conjunctiva/sclera: Conjunctivae normal.  Cardiovascular:     Rate and Rhythm: Normal rate.  Pulmonary:     Effort: Pulmonary effort is normal. No respiratory distress.  Abdominal:     General: There is no distension.  Musculoskeletal:        General: Normal range of motion.     Cervical back: Neck supple.  Skin:    General: Skin is warm and dry.  Neurological:     Mental Status: She is alert and oriented to person, place, and time.      UC Treatments / Results  Labs (all labs ordered are listed, but only abnormal results are displayed) Labs Reviewed  POCT URINALYSIS DIP (MANUAL ENTRY) - Abnormal; Notable for the following components:      Result Value   Blood, UA trace-intact (*)    All other components within normal limits  CERVICOVAGINAL ANCILLARY ONLY    EKG   Radiology No results found.  Procedures Procedures (including critical care time)  Medications Ordered in UC Medications - No  data to display  Initial Impression / Assessment and Plan / UC Course  I have reviewed the triage vital signs and the nursing notes.  Pertinent labs & imaging results that were available during my care of the patient were reviewed by me and considered in my medical decision making (see chart for details).     UA with negative leuks and nitrites, symptoms suggestive of likely vaginitis causing symptoms, most suspicious of BV, possible yeast given associated itching as well.  Empirically treating with Flagyl and Diflucan.  Vaginal swab pending to confirm as well as screen for STDs.  Alter therapy as needed based off results.  Discussed strict return precautions. Patient verbalized understanding and is agreeable with plan.  Final Clinical Impressions(s) / UC Diagnoses   Final diagnoses:  Vaginitis and vulvovaginitis     Discharge Instructions     Metronidazole twice daily for 1 week, no alcohol while taking Diflucan once, repeat in 72 hours if not improving  We are testing you for Gonorrhea, Chlamydia, Trichomonas, Yeast and Bacterial Vaginosis. We will call you if anything is positive and let you know if you require any further treatment. Please inform partners of any positive results.   Please return if symptoms not improving with treatment, development of fever, nausea, vomiting, abdominal pain.     ED Prescriptions    Medication Sig Dispense Auth. Provider   metroNIDAZOLE (FLAGYL) 500 MG tablet Take 1 tablet (500 mg total) by mouth 2 (two) times daily for 7 days. 14 tablet Rhen Dossantos, [place]  C, PA-C   fluconazole (DIFLUCAN) 150 MG tablet Take 1 tablet (150 mg total) by mouth once for 1 dose. 2 tablet Micca Matura, [place] C, PA-C     PDMP not reviewed this encounter.   Janith Lima, [place] 10/22/20 [id number]

## 2020-10-22 NOTE — ED Triage Notes (Signed)
Pt said a few day she ha se ben having frequent urination with itching in around the labia area. Pt said painful to urinate.

## 2020-10-25 LAB — CERVICOVAGINAL ANCILLARY ONLY
Bacterial Vaginitis (gardnerella): NEGATIVE
Candida Glabrata: NEGATIVE
Candida Vaginitis: NEGATIVE
Chlamydia: NEGATIVE
Comment: NEGATIVE
Comment: NEGATIVE
Comment: NEGATIVE
Comment: NEGATIVE
Comment: NEGATIVE
Comment: NORMAL
Neisseria Gonorrhea: NEGATIVE
Trichomonas: NEGATIVE

## 2021-02-20 ENCOUNTER — Emergency Department (HOSPITAL_BASED_OUTPATIENT_CLINIC_OR_DEPARTMENT_OTHER)
Admission: EM | Admit: 2021-02-20 | Discharge: 2021-02-21 | Disposition: A | Discharge status: Home / Self Care | Payer: Self-pay | Attending: Emergency Medicine | Admitting: Emergency Medicine

## 2021-02-20 ENCOUNTER — Other Ambulatory Visit: Payer: Self-pay

## 2021-02-20 ENCOUNTER — Emergency Department (HOSPITAL_BASED_OUTPATIENT_CLINIC_OR_DEPARTMENT_OTHER): Payer: Self-pay

## 2021-02-20 ENCOUNTER — Encounter (HOSPITAL_BASED_OUTPATIENT_CLINIC_OR_DEPARTMENT_OTHER): Payer: Self-pay

## 2021-02-20 DIAGNOSIS — J069 Acute upper respiratory infection, unspecified: Secondary | ICD-10-CM

## 2021-02-20 DIAGNOSIS — R0602 Shortness of breath: Secondary | ICD-10-CM | POA: Insufficient documentation

## 2021-02-20 DIAGNOSIS — Z20822 Contact with and (suspected) exposure to covid-19: Secondary | ICD-10-CM | POA: Insufficient documentation

## 2021-02-20 DIAGNOSIS — R0981 Nasal congestion: Secondary | ICD-10-CM | POA: Insufficient documentation

## 2021-02-20 DIAGNOSIS — R059 Cough, unspecified: Secondary | ICD-10-CM | POA: Insufficient documentation

## 2021-02-20 DIAGNOSIS — Z87891 Personal history of nicotine dependence: Secondary | ICD-10-CM | POA: Insufficient documentation

## 2021-02-20 DIAGNOSIS — J4541 Moderate persistent asthma with (acute) exacerbation: Secondary | ICD-10-CM

## 2021-02-20 DIAGNOSIS — I10 Essential (primary) hypertension: Secondary | ICD-10-CM | POA: Insufficient documentation

## 2021-02-20 MED ORDER — IPRATROPIUM-ALBUTEROL 0.5-2.5 (3) MG/3ML IN SOLN
3.0000 mL | Freq: Once | RESPIRATORY_TRACT | Status: AC
  Administered 2021-02-20: 3 mL via RESPIRATORY_TRACT
  Filled 2021-02-20: qty 3

## 2021-02-20 NOTE — ED Provider Notes (Signed)
[facility] Provider Note   CSN: [id number] Arrival date & time: 02/20/21  1942     History Chief Complaint  Patient presents with   Cough    Robin Fox is a 45 y.o. female.  Patient is a 45 year old female with history of hypertension, anemia.  Patient presenting with complaints of cough, shortness of breath, and congestion for the past 4 days.  Patient has been using her inhaler with little relief.  Her cough is occasionally productive of yellow sputum.  She denies fevers or chills.  She denies any chest pain or leg swelling.  She denies any ill contacts or exposures to [place].  The history is provided by the patient.  Cough Cough characteristics:  Non-productive Severity:  Moderate Onset quality:  Sudden Timing:  Constant Progression:  Worsening Chronicity:  New Relieved by:  Nothing Worsened by:  Nothing Ineffective treatments:  Beta-agonist inhaler Associated symptoms: no fever       Past Medical History:  Diagnosis Date   Anemia    hx    Gestational diabetes    Neg 2 hr test after preg   Headache(784.0)    Herpes genitalia    2013 - 1st outbreak   Hypertension    2008   Pre-eclampsia in third trimester    Seasonal allergies    Shortness of breath    w exertion   Trichomonas    Twins     Patient Active Problem List   Diagnosis Date Noted   Reactive airway disease 08/14/2020   Pancreatitis 08/13/2020   Screening breast examination 03/30/2019   Breast pain 03/30/2019   Tobacco use 08/01/2016   Routine postpartum follow-up 05/22/2012   Mild or unspecified pre-eclampsia, with delivery 04/07/2012   Abnormal maternal glucose tolerance, antepartum 03/27/2012   History of IUGR (intrauterine growth retardation) and stillbirth, currently pregnant 02/12/2012   History of stillbirth 12/12/2011   Previous cesarean delivery, antepartum condition or complication 85/88/5027   Obesity 11/21/2011   Hx of herpes genitalis  11/21/2011   Hypertension in pregnancy, essential, antepartum 11/09/2011   Essential hypertension 11/09/2011   AMA (advanced maternal age) multigravida 35+ 11/09/2011    Past Surgical History:  Procedure Laterality Date   CESAREAN SECTION     X 2   CESAREAN SECTION  04/21/2012   Procedure: CESAREAN SECTION;  Surgeon: Woodroe Mode, MD;  Location: [facility];  Service: Obstetrics;  Laterality: N/A;   TOOTH EXTRACTION       OB History     Gravida  4   Para  3   Term  1   Preterm  2   AB  0   Living  4      SAB  0   IAB  0   Ectopic  0   Multiple  2   Live Births  4           Family History  Problem Relation Age of Onset   Hypertension Mother    Stroke Mother    Stroke Maternal Grandmother    Heart disease Maternal Grandfather 42   Hypertension Brother    Diabetes Brother    Stomach cancer Brother    Hypertension Brother    Diabetes Brother     Social History   Tobacco Use   Smoking status: Former    Packs/day: 0.25    Years: 17.00    Pack years: 4.25    Types: Cigarettes    Quit date: 09/11/2019  Years since quitting: 1.4   Smokeless tobacco: Never  Vaping Use   Vaping Use: Never used  Substance Use Topics   Alcohol use: Not Currently   Drug use: No    Home Medications Prior to Admission medications   Medication Sig Start Date End Date Taking? Authorizing Provider  acetaminophen (TYLENOL) 500 MG tablet Take 1,000 mg by mouth every 6 (six) hours as needed for mild pain or headache.    [provider]  albuterol (PROVENTIL) (2.5 MG/3ML) 0.083% nebulizer solution Take 3 mLs (2.5 mg total) by nebulization every 6 (six) hours as needed for wheezing or shortness of breath. 05/15/20   [name], NP  albuterol (VENTOLIN HFA) 108 (90 Base) MCG/ACT inhaler Inhale 1-2 puffs into the lungs every 6 (six) hours as needed for wheezing or shortness of breath. 05/05/20   Wieters, Hallie C, PA-C  alum & mag hydroxide-simeth (MAALOX/MYLANTA)  200-200-20 MG/5ML suspension Take 15 mLs by mouth every 6 (six) hours as needed for indigestion or heartburn. 08/11/20   Wieters, Hallie C, PA-C  famotidine (PEPCID) 20 MG tablet Take 1 tablet (20 mg total) by mouth 2 (two) times daily as needed for heartburn or indigestion. 08/28/20   Vevelyn Francois, NP  ferrous sulfate 325 (65 FE) MG tablet Take 325 mg by mouth daily with breakfast.    [provider]  montelukast (SINGULAIR) 10 MG tablet Take 1 tablet (10 mg total) by mouth at bedtime. 06/26/20   [name], MD  Multiple Vitamin (MULTIVITAMIN) tablet Take 1 tablet by mouth daily.    [provider]  ondansetron (ZOFRAN ODT) 4 MG disintegrating tablet Take 1 tablet (4 mg total) by mouth every 8 (eight) hours as needed for nausea or vomiting. 08/28/20   Vevelyn Francois, NP  traMADol (ULTRAM) 50 MG tablet Take 1 tablet (50 mg total) by mouth every 6 (six) hours as needed. 08/23/20   Tegeler, Gwenyth Allegra, MD  valACYclovir (VALTREX) 500 MG tablet TAKE 1 TABLET BY MOUTH ONCE DAILY INCREASE  TO  TWICE  DAILY  FOR  3  DAYS  DURING  OUTBREAK Patient taking differently: Take 500 mg by mouth See admin instructions. Take one tablet  (500 mg) daily increase to twice daily for 3 days for outbreak 06/26/20   [name], MD    Allergies    Penicillins  Review of Systems   Review of Systems  Constitutional:  Negative for fever.  Respiratory:  Positive for cough.   All other systems reviewed and are negative.  Physical Exam Updated Vital Signs BP 127/82 (BP Location: Left Arm)   Pulse 76   Temp 98.5 F (36.9 C) (Oral)   Resp 18   Ht 5\' 7"  (1.702 m)   Wt 103.9 kg   LMP  (LMP Unknown)   SpO2 98%   BMI 35.87 kg/m   Physical Exam Vitals and nursing note reviewed.  Constitutional:      General: She is not in acute distress.    Appearance: She is well-developed. She is not diaphoretic.  HENT:     Head: Normocephalic and atraumatic.  Cardiovascular:     Rate and Rhythm:  Normal rate and regular rhythm.     Heart sounds: No murmur heard.   No friction rub. No gallop.  Pulmonary:     Effort: Pulmonary effort is normal. No respiratory distress.     Breath sounds: Normal breath sounds. No wheezing.  Abdominal:     General: Bowel sounds  are normal. There is no distension.     Palpations: Abdomen is soft.     Tenderness: There is no abdominal tenderness.  Musculoskeletal:        General: Normal range of motion.     Cervical back: Normal range of motion and neck supple.  Skin:    General: Skin is warm and dry.  Neurological:     General: No focal deficit present.     Mental Status: She is alert and oriented to person, place, and time.    ED Results / Procedures / Treatments   Labs (all labs ordered are listed, but only abnormal results are displayed) Labs Reviewed - No data to display  EKG None  Radiology No results found.  Procedures Procedures   Medications Ordered in ED Medications  ipratropium-albuterol (DUONEB) 0.5-2.5 (3) MG/3ML nebulizer solution 3 mL (has no administration in time range)    ED Course  I have reviewed the triage vital signs and the nursing notes.  Pertinent labs & imaging results that were available during my care of the patient were reviewed by me and considered in my medical decision making (see chart for details).    MDM Rules/Calculators/A&P  Patient with URI symptoms for the past 4 days.  She describes worsened wheezing and shortness of breath.  Patient's chest x-ray is clear and COVID test is negative.  I do suspect a viral etiology with reactive airway.  Patient given a nebulizer treatment and will be discharged with steroids and follow-up as needed.  Final Clinical Impression(s) / ED Diagnoses Final diagnoses:  None    Rx / DC Orders ED Discharge Orders     None        Veryl Speak, MD 02/21/21 [phone]

## 2021-02-20 NOTE — ED Notes (Signed)
Patient states she has had some "wheezing" since Saturday using sons nebulizer but doesn't believe that is working correctly. Patient clear and diminished bilaterally, no wheezes noted. HR 82, RR 18, SATs 99% on RA.

## 2021-02-20 NOTE — ED Triage Notes (Signed)
Pt c/o cough x today-wheezing x 4 days-last used inhaler 1pm today-NAD-steady gait

## 2021-02-21 LAB — RESP PANEL BY RT-PCR (FLU A&B, COVID) ARPGX2
Influenza A by PCR: NEGATIVE
Influenza B by PCR: NEGATIVE
SARS Coronavirus 2 by RT PCR: NEGATIVE

## 2021-02-21 MED ORDER — PREDNISONE 10 MG PO TABS: 20.0000 mg | ORAL_TABLET | Freq: Two times a day (BID) | ORAL | 0 refills | Status: DC

## 2021-02-21 MED ORDER — PREDNISONE 20 MG PO TABS
40.0000 mg | ORAL_TABLET | Freq: Once | ORAL | Status: AC
  Administered 2021-02-21: 40 mg via ORAL
  Filled 2021-02-21: qty 2

## 2021-02-21 NOTE — Discharge Instructions (Addendum)
Continue use of your inhaler as before.  Begin taking prednisone as prescribed.  Take over-the-counter medications as needed for relief of symptoms.  Follow-up with primary doctor if not improving in the next few days, and return to the ER if symptoms significantly worsen or change.

## 2021-02-28 ENCOUNTER — Other Ambulatory Visit: Payer: Self-pay | Admitting: Registered Nurse

## 2021-02-28 ENCOUNTER — Ambulatory Visit: Payer: 59

## 2021-02-28 DIAGNOSIS — R0602 Shortness of breath: Secondary | ICD-10-CM

## 2021-02-28 DIAGNOSIS — R062 Wheezing: Secondary | ICD-10-CM

## 2021-04-23 ENCOUNTER — Encounter: Payer: Self-pay | Admitting: Obstetrics and Gynecology

## 2021-05-16 ENCOUNTER — Encounter: Payer: Self-pay | Admitting: Family Medicine

## 2021-06-15 ENCOUNTER — Ambulatory Visit
Admission: EM | Admit: 2021-06-15 | Discharge: 2021-06-15 | Disposition: A | Discharge status: Home / Self Care | Payer: Self-pay | Attending: Internal Medicine | Admitting: Internal Medicine

## 2021-06-15 ENCOUNTER — Encounter: Payer: Self-pay | Admitting: Emergency Medicine

## 2021-06-15 ENCOUNTER — Other Ambulatory Visit: Payer: Self-pay

## 2021-06-15 DIAGNOSIS — N760 Acute vaginitis: Secondary | ICD-10-CM | POA: Insufficient documentation

## 2021-06-15 LAB — POCT URINALYSIS DIP (MANUAL ENTRY)
Bilirubin, UA: NEGATIVE
Blood, UA: NEGATIVE
Glucose, UA: NEGATIVE mg/dL
Ketones, POC UA: NEGATIVE mg/dL
Leukocytes, UA: NEGATIVE
Nitrite, UA: NEGATIVE
Protein Ur, POC: 30 mg/dL — AB
Spec Grav, UA: >=1.03 — AB (ref 1.010–1.025)
Urobilinogen, UA: 0.2 E.U./dL
pH, UA: 5.5 (ref 5.0–8.0)

## 2021-06-15 MED ORDER — METRONIDAZOLE 500 MG PO TABS: 500.0000 mg | ORAL_TABLET | Freq: Two times a day (BID) | ORAL | 0 refills | Status: DC

## 2021-06-15 NOTE — ED Provider Notes (Signed)
Robin Fox    CSN: [id number] Arrival date & time: 06/15/21  0859      History   Chief Complaint Chief Complaint  Patient presents with   Dysuria   Vaginal Irritation    HPI Robin Fox is a 46 y.o. female comes to the urgent care with a 8-day history of suprapubic pressure, dysuria and urgency as well as vaginal discharge.  Patient's symptoms have been persistent.  Patient describes vaginal discharge as clear and thin.  Vaginal discharge is malodorous.  Patient also complains of lower back pain with muscle stiffness.  No trauma to the back.  No falls.  Pain is aggravated by movement and palpation.  No known relieving factors.  Pain does not radiate into the lower extremities.  No fever or chills.  No nausea or vomiting.  Patient describes deep dyspareunia. Last menstrual period was May 19, 2021.  Patient is sexually active with 1 sexual partner.  She engages in unprotected sexual intercourse.  HPI  Past Medical History:  Diagnosis Date   Anemia    hx    Gestational diabetes    Neg 2 hr test after preg   Headache(784.0)    Herpes genitalia    2013 - 1st outbreak   Hypertension    2008   Pre-eclampsia in third trimester    Seasonal allergies    Shortness of breath    w exertion   Trichomonas    Twins     Patient Active Problem List   Diagnosis Date Noted   Reactive airway disease 08/14/2020   Pancreatitis 08/13/2020   Screening breast examination 03/30/2019   Breast pain 03/30/2019   Tobacco use 08/01/2016   Routine postpartum follow-up 05/22/2012   Mild or unspecified pre-eclampsia, with delivery 04/07/2012   Abnormal maternal glucose tolerance, antepartum 03/27/2012   History of IUGR (intrauterine growth retardation) and stillbirth, currently pregnant 02/12/2012   History of stillbirth 12/12/2011   Previous cesarean delivery, antepartum condition or complication 68/34/1962   Obesity 11/21/2011   Hx of herpes genitalis 11/21/2011    Hypertension in pregnancy, essential, antepartum 11/09/2011   Essential hypertension 11/09/2011   AMA (advanced maternal age) multigravida 35+ 11/09/2011    Past Surgical History:  Procedure Laterality Date   CESAREAN SECTION     X 2   CESAREAN SECTION  04/21/2012   Procedure: CESAREAN SECTION;  Surgeon: Woodroe Mode, MD;  Location: [facility];  Service: Obstetrics;  Laterality: N/A;   TOOTH EXTRACTION      OB History     Gravida  4   Para  3   Term  1   Preterm  2   AB  0   Living  4      SAB  0   IAB  0   Ectopic  0   Multiple  2   Live Births  4            Home Medications    Prior to Admission medications   Medication Sig Start Date End Date Taking? Authorizing Provider  metroNIDAZOLE (FLAGYL) 500 MG tablet Take 1 tablet (500 mg total) by mouth 2 (two) times daily. 06/15/21  Yes Kourtney Terriquez, Myrene Galas, MD  acetaminophen (TYLENOL) 500 MG tablet Take 1,000 mg by mouth every 6 (six) hours as needed for mild pain or headache.    [provider]  albuterol (PROVENTIL) (2.5 MG/3ML) 0.083% nebulizer solution USE 1 VIAL IN NEBULIZER EVERY 6 HOURS AS NEEDED FOR WHEEZING FOR  SHORTNESS OF BREATH 02/28/21   [name], NP  albuterol (VENTOLIN HFA) 108 (90 Base) MCG/ACT inhaler Inhale 1-2 puffs into the lungs every 6 (six) hours as needed for wheezing or shortness of breath. 05/05/20   Wieters, Hallie C, PA-C  alum & mag hydroxide-simeth (MAALOX/MYLANTA) 200-200-20 MG/5ML suspension Take 15 mLs by mouth every 6 (six) hours as needed for indigestion or heartburn. 08/11/20   Wieters, Hallie C, PA-C  famotidine (PEPCID) 20 MG tablet Take 1 tablet (20 mg total) by mouth 2 (two) times daily as needed for heartburn or indigestion. 08/28/20   Vevelyn Francois, NP  ferrous sulfate 325 (65 FE) MG tablet Take 325 mg by mouth daily with breakfast.    [provider]  montelukast (SINGULAIR) 10 MG tablet Take 1 tablet (10 mg total) by mouth at bedtime. 06/26/20   [name], MD  Multiple Vitamin (MULTIVITAMIN) tablet Take 1 tablet by mouth daily.    [provider]  ondansetron (ZOFRAN ODT) 4 MG disintegrating tablet Take 1 tablet (4 mg total) by mouth every 8 (eight) hours as needed for nausea or vomiting. 08/28/20   Vevelyn Francois, NP  predniSONE (DELTASONE) 10 MG tablet Take 2 tablets (20 mg total) by mouth 2 (two) times daily. 02/21/21   Veryl Speak, MD  traMADol (ULTRAM) 50 MG tablet Take 1 tablet (50 mg total) by mouth every 6 (six) hours as needed. 08/23/20   Tegeler, Gwenyth Allegra, MD  valACYclovir (VALTREX) 500 MG tablet TAKE 1 TABLET BY MOUTH ONCE DAILY INCREASE  TO  TWICE  DAILY  FOR  3  DAYS  DURING  OUTBREAK Patient taking differently: Take 500 mg by mouth See admin instructions. Take one tablet  (500 mg) daily increase to twice daily for 3 days for outbreak 06/26/20   [name], MD    Family History Family History  Problem Relation Age of Onset   Hypertension Mother    Stroke Mother    Stroke Maternal Grandmother    Heart disease Maternal Grandfather 68   Hypertension Brother    Diabetes Brother    Stomach cancer Brother    Hypertension Brother    Diabetes Brother     Social History Social History   Tobacco Use   Smoking status: Former    Packs/day: 0.25    Years: 17.00    Pack years: 4.25    Types: Cigarettes    Quit date: 09/11/2019    Years since quitting: 1.7   Smokeless tobacco: Never  Vaping Use   Vaping Use: Never used  Substance Use Topics   Alcohol use: Not Currently   Drug use: No     Allergies   Penicillins   Review of Systems Review of Systems  Constitutional: Negative.   Respiratory: Negative.    Cardiovascular: Negative.   Gastrointestinal:  Negative for blood in stool, constipation, nausea and vomiting.  Genitourinary:  Positive for dyspareunia, dysuria, frequency, urgency and vaginal discharge. Negative for vaginal bleeding.    Physical Exam Triage Vital Signs ED Triage  Vitals [06/15/21 0929]  Enc Vitals Group     BP      Pulse      Resp      Temp      Temp src      SpO2      Weight      Height      Head Circumference      Peak Flow      Pain  Score 0     Pain Loc      Pain Edu?      Excl. in [place]?    No data found.  Updated Vital Signs There were no vitals taken for this visit.  Visual Acuity Right Eye Distance:   Left Eye Distance:   Bilateral Distance:    Right Eye Near:   Left Eye Near:    Bilateral Near:     Physical Exam Vitals and nursing note reviewed.  Constitutional:      General: She is not in acute distress.    Appearance: She is not ill-appearing.  Cardiovascular:     Rate and Rhythm: Normal rate and regular rhythm.  Pulmonary:     Effort: Pulmonary effort is normal.     Breath sounds: Normal breath sounds.  Abdominal:     General: Bowel sounds are normal.     Palpations: Abdomen is soft.     Tenderness: There is no abdominal tenderness. There is no guarding or rebound.     Hernia: No hernia is present.  Musculoskeletal:        General: Swelling and tenderness present. Normal range of motion.     Cervical back: Normal range of motion and neck supple.     Comments: Pain on palpation over the paraspinal muscle in the lumbosacral region.  No flank tenderness.  No bruising noted.  Neurological:     Mental Status: She is alert.     UC Treatments / Results  Labs (all labs ordered are listed, but only abnormal results are displayed) Labs Reviewed  POCT URINALYSIS DIP (MANUAL ENTRY) - Abnormal; Notable for the following components:      Result Value   Clarity, UA cloudy (*)    Spec Grav, UA >=1.030 (*)    Protein Ur, POC =30 (*)    All other components within normal limits  CERVICOVAGINAL ANCILLARY ONLY    EKG   Radiology No results found.  Procedures Procedures (including critical care time)  Medications Ordered in UC Medications - No data to display  Initial Impression / Assessment and Plan / UC  Course  I have reviewed the triage vital signs and the nursing notes.  Pertinent labs & imaging results that were available during my care of the patient were reviewed by me and considered in my medical decision making (see chart for details).     1.  Acute vaginitis: Point-of-care urinalysis is negative for UTI No indication for sending cultures Cervical vaginal swab for GC/chlamydia/trichomonas/vaginal yeast/bacterial vaginosis. Metronidazole 500 mg twice daily for 7 days We will call patient with recommendations Return precautions given  2.  Lower back pain, likely secondary to work: Gentle range of motion exercises Ibuprofen as needed for pain Patient is advised to take a muscle relaxants-precautions given Return precautions given. Final Clinical Impressions(s) / UC Diagnoses   Final diagnoses:  Acute vaginitis     Discharge Instructions      Please take medications as prescribed Increase oral fluid intake We will call you with recommendations if labs are abnormal Return to urgent care if you have worsening or persistent symptoms. Avoid sexual intercourse until lab results are available.   ED Prescriptions     Medication Sig Dispense Auth. Provider   metroNIDAZOLE (FLAGYL) 500 MG tablet Take 1 tablet (500 mg total) by mouth 2 (two) times daily. 14 tablet Allien Melberg, Myrene Galas, MD      PDMP not reviewed this encounter.   Chase Picket, MD 06/15/21  1042 ° °

## 2021-06-15 NOTE — ED Triage Notes (Signed)
Pt here with 8 day hx of suprapubic pressure and cramping along with increased urinary frequency and urgency. Pt also endorses vaginal odor and discharge. No new sexual partners and no chance of pregnancy.

## 2021-06-15 NOTE — Discharge Instructions (Addendum)
Please take medications as prescribed Increase oral fluid intake We will call you with recommendations if labs are abnormal Return to urgent care if you have worsening or persistent symptoms. Avoid sexual intercourse until lab results are available.

## 2021-06-18 ENCOUNTER — Emergency Department (HOSPITAL_COMMUNITY)
Admission: EM | Admit: 2021-06-18 | Discharge: 2021-06-19 | Discharge status: Against Medical Advice | Payer: Self-pay | Attending: Student | Admitting: Student

## 2021-06-18 ENCOUNTER — Telehealth (HOSPITAL_COMMUNITY): Payer: Self-pay | Admitting: Emergency Medicine

## 2021-06-18 ENCOUNTER — Emergency Department (HOSPITAL_COMMUNITY): Payer: Self-pay

## 2021-06-18 ENCOUNTER — Encounter (HOSPITAL_COMMUNITY): Payer: Self-pay

## 2021-06-18 DIAGNOSIS — I10 Essential (primary) hypertension: Secondary | ICD-10-CM | POA: Insufficient documentation

## 2021-06-18 DIAGNOSIS — R0789 Other chest pain: Secondary | ICD-10-CM | POA: Insufficient documentation

## 2021-06-18 DIAGNOSIS — R519 Headache, unspecified: Secondary | ICD-10-CM | POA: Insufficient documentation

## 2021-06-18 DIAGNOSIS — Z5321 Procedure and treatment not carried out due to patient leaving prior to being seen by health care provider: Secondary | ICD-10-CM | POA: Insufficient documentation

## 2021-06-18 DIAGNOSIS — R42 Dizziness and giddiness: Secondary | ICD-10-CM | POA: Insufficient documentation

## 2021-06-18 LAB — CERVICOVAGINAL ANCILLARY ONLY
Bacterial Vaginitis (gardnerella): NEGATIVE
Candida Glabrata: NEGATIVE
Candida Vaginitis: POSITIVE — AB
Chlamydia: NEGATIVE
Comment: NEGATIVE
Comment: NEGATIVE
Comment: NEGATIVE
Comment: NEGATIVE
Comment: NEGATIVE
Comment: NORMAL
Neisseria Gonorrhea: NEGATIVE
Trichomonas: NEGATIVE

## 2021-06-18 LAB — CBC
HCT: 44.2 % (ref 36.0–46.0)
Hemoglobin: 13.9 g/dL (ref 12.0–15.0)
MCH: 22.5 pg — ABNORMAL LOW (ref 26.0–34.0)
MCHC: 31.4 g/dL (ref 30.0–36.0)
MCV: 71.4 fL — ABNORMAL LOW (ref 80.0–100.0)
Platelets: 377 10*3/uL (ref 150–400)
RBC: 6.19 MIL/uL — ABNORMAL HIGH (ref 3.87–5.11)
RDW: 14.5 % (ref 11.5–15.5)
WBC: 7 10*3/uL (ref 4.0–10.5)
nRBC: 0 % (ref 0.0–0.2)

## 2021-06-18 LAB — BASIC METABOLIC PANEL
Anion gap: 10 (ref 5–15)
BUN: 13 mg/dL (ref 6–20)
CO2: 24 mmol/L (ref 22–32)
Calcium: 9.3 mg/dL (ref 8.9–10.3)
Chloride: 99 mmol/L (ref 98–111)
Creatinine, Ser: 0.96 mg/dL (ref 0.44–1.00)
GFR, Estimated: >60 mL/min (ref >60)
Glucose, Bld: 114 mg/dL — ABNORMAL HIGH (ref 70–99)
Potassium: 3.4 mmol/L — ABNORMAL LOW (ref 3.5–5.1)
Sodium: 133 mmol/L — ABNORMAL LOW (ref 135–145)

## 2021-06-18 LAB — TROPONIN I (HIGH SENSITIVITY): Troponin I (High Sensitivity): 5 ng/L (ref <18)

## 2021-06-18 MED ORDER — FLUCONAZOLE 150 MG PO TABS: 150.0000 mg | ORAL_TABLET | Freq: Once | ORAL | 0 refills | Status: AC

## 2021-06-18 NOTE — ED Provider Triage Note (Signed)
Emergency Medicine Provider Triage Evaluation Note  Robin Fox , a 46 y.o. female  was evaluated in triage.  Pt complains of headache, dizziness, chest pain, arm discomfort and elevated blood pressure.  Patient reports she has a history of hypertension and got a blood pressure reading of 150/110 today and that is higher than its ever been.  Has a history of strokes and heart attacks in her family and would like to rule out the same in herself.  Is in between primary care providers at this time.  On HCTZ 25.  Review of Systems  Positive: As above Negative: SOB  Physical Exam  BP (!) 154/103 (BP Location: Right Arm)    Pulse 81    Temp (!) 97.5 F (36.4 C) (Oral)    Resp 16    Ht 5\' 7"  (1.702 m)    Wt 104 kg    SpO2 100%    BMI 35.91 kg/m  Gen:   Awake, no distress   Resp:  Normal effort  MSK:   Moves extremities without difficulty  Other:  Benign neuro exam.  Finger-nose normal.  Full range of motion of all extremities, 5 out of 5 strength.  PERRLA.  No facial droop.  RRR, lung sounds clear.  Medical Decision Making  Medically screening exam initiated at 9:46 PM.  Appropriate orders placed.  Robin Fox was informed that the remainder of the evaluation will be completed by another provider, this initial triage assessment does not replace that evaluation, and the importance of remaining in the ED until their evaluation is complete.     Rhae Hammock, PA-C 06/18/21 2148

## 2021-06-18 NOTE — ED Triage Notes (Signed)
Pt arrives POV for eval of dizziness and hypertension. Pt reports onset of dizziness about 1 hour PTA. States her L arm feels "heavy". Equal grip strengths bilaterally, no facial droop, denies numbness/tingling. No focal neuro deficits in triage, states hx of HTN, compliant w/ meds.

## 2021-06-19 LAB — TROPONIN I (HIGH SENSITIVITY): Troponin I (High Sensitivity): 6 ng/L (ref <18)

## 2021-06-19 NOTE — ED Notes (Signed)
Pt states they are leaving  

## 2021-07-03 ENCOUNTER — Other Ambulatory Visit: Payer: Self-pay | Admitting: Registered Nurse

## 2021-07-03 DIAGNOSIS — R062 Wheezing: Secondary | ICD-10-CM

## 2021-07-03 DIAGNOSIS — R0602 Shortness of breath: Secondary | ICD-10-CM

## 2021-08-02 ENCOUNTER — Emergency Department (HOSPITAL_COMMUNITY): Payer: Self-pay

## 2021-08-02 ENCOUNTER — Encounter (HOSPITAL_COMMUNITY): Payer: Self-pay

## 2021-08-02 ENCOUNTER — Emergency Department (HOSPITAL_COMMUNITY)
Admission: EM | Admit: 2021-08-02 | Discharge: 2021-08-03 | Discharge status: Against Medical Advice | Payer: Self-pay | Attending: Student | Admitting: Student

## 2021-08-02 DIAGNOSIS — Z5321 Procedure and treatment not carried out due to patient leaving prior to being seen by health care provider: Secondary | ICD-10-CM | POA: Insufficient documentation

## 2021-08-02 DIAGNOSIS — R1013 Epigastric pain: Secondary | ICD-10-CM | POA: Insufficient documentation

## 2021-08-02 DIAGNOSIS — R112 Nausea with vomiting, unspecified: Secondary | ICD-10-CM | POA: Insufficient documentation

## 2021-08-02 DIAGNOSIS — R1011 Right upper quadrant pain: Secondary | ICD-10-CM | POA: Insufficient documentation

## 2021-08-02 DIAGNOSIS — R197 Diarrhea, unspecified: Secondary | ICD-10-CM | POA: Insufficient documentation

## 2021-08-02 DIAGNOSIS — F1099 Alcohol use, unspecified with unspecified alcohol-induced disorder: Secondary | ICD-10-CM | POA: Insufficient documentation

## 2021-08-02 LAB — CBC WITH DIFFERENTIAL/PLATELET
Abs Immature Granulocytes: 0.03 10*3/uL (ref 0.00–0.07)
Basophils Absolute: 0 10*3/uL (ref 0.0–0.1)
Basophils Relative: 0 %
Eosinophils Absolute: 0.3 10*3/uL (ref 0.0–0.5)
Eosinophils Relative: 3 %
HCT: 44.2 % (ref 36.0–46.0)
Hemoglobin: 14.2 g/dL (ref 12.0–15.0)
Immature Granulocytes: 0 %
Lymphocytes Relative: 43 %
Lymphs Abs: 3.6 10*3/uL (ref 0.7–4.0)
MCH: 23.1 pg — ABNORMAL LOW (ref 26.0–34.0)
MCHC: 32.1 g/dL (ref 30.0–36.0)
MCV: 71.8 fL — ABNORMAL LOW (ref 80.0–100.0)
Monocytes Absolute: 0.5 10*3/uL (ref 0.1–1.0)
Monocytes Relative: 6 %
Neutro Abs: 3.9 10*3/uL (ref 1.7–7.7)
Neutrophils Relative %: 48 %
Platelets: 338 10*3/uL (ref 150–400)
RBC: 6.16 MIL/uL — ABNORMAL HIGH (ref 3.87–5.11)
RDW: 15.6 % — ABNORMAL HIGH (ref 11.5–15.5)
WBC: 8.3 10*3/uL (ref 4.0–10.5)
nRBC: 0 % (ref 0.0–0.2)

## 2021-08-02 LAB — URINALYSIS, ROUTINE W REFLEX MICROSCOPIC
Bilirubin Urine: NEGATIVE
Glucose, UA: NEGATIVE mg/dL
Hgb urine dipstick: NEGATIVE
Ketones, ur: NEGATIVE mg/dL
Leukocytes,Ua: NEGATIVE
Nitrite: NEGATIVE
Protein, ur: NEGATIVE mg/dL
Specific Gravity, Urine: 1.006 (ref 1.005–1.030)
pH: 7 (ref 5.0–8.0)

## 2021-08-02 LAB — COMPREHENSIVE METABOLIC PANEL
ALT: 27 U/L (ref 0–44)
AST: 41 U/L (ref 15–41)
Albumin: 4.1 g/dL (ref 3.5–5.0)
Alkaline Phosphatase: 57 U/L (ref 38–126)
Anion gap: 10 (ref 5–15)
BUN: 11 mg/dL (ref 6–20)
CO2: 27 mmol/L (ref 22–32)
Calcium: 9.5 mg/dL (ref 8.9–10.3)
Chloride: 99 mmol/L (ref 98–111)
Creatinine, Ser: 0.97 mg/dL (ref 0.44–1.00)
GFR, Estimated: >60 mL/min (ref >60)
Glucose, Bld: 101 mg/dL — ABNORMAL HIGH (ref 70–99)
Potassium: 3.7 mmol/L (ref 3.5–5.1)
Sodium: 136 mmol/L (ref 135–145)
Total Bilirubin: 0.4 mg/dL (ref 0.3–1.2)
Total Protein: 7.4 g/dL (ref 6.5–8.1)

## 2021-08-02 LAB — LIPASE, BLOOD: Lipase: 36 U/L (ref 11–51)

## 2021-08-02 LAB — I-STAT BETA HCG BLOOD, ED (MC, WL, AP ONLY): I-stat hCG, quantitative: <5 m[IU]/mL (ref <5)

## 2021-08-02 NOTE — ED Triage Notes (Signed)
Pt came in with c/o abdominal pain that is in RUQ and epigastric. Been going on for four days. States everything she eats irritates her stomach. Vomited today. Hx pancreatitis.  ?

## 2021-08-02 NOTE — ED Provider Triage Note (Signed)
Emergency Medicine Provider Triage Evaluation Note ? ?Robin Fox , a 46 y.o. female  was evaluated in triage.  Pt complains of abdominal pain.  She states that for about 4 days she has had worsening right upper quadrant and epigastric abdominal pain.  She states that today she began having nausea and vomiting.  She states that she has worsening pain with eating.  She is also having diarrhea.  She denies fevers..  States that she has had pancreatitis previously but is unsure why.  She does endorse previous alcohol use. ? ?Review of Systems  ?Positive: See above ?Negative:  ? ?Physical Exam  ?Wt 104.3 kg   BMI 36.02 kg/m?  ?Gen:   Awake, no distress   ?Resp:  Normal effort  ?MSK:   Moves extremities without difficulty  ?Other:  Abdomen is rounded, soft, tenderness to palpation of the right upper quadrant epigastrium.  Bowel sounds present. ?Medical Decision Making  ?Medically screening exam initiated at 8:35 PM.  Appropriate orders placed.  Robin Fox was informed that the remainder of the evaluation will be completed by another provider, this initial triage assessment does not replace that evaluation, and the importance of remaining in the ED until their evaluation is complete. ? ? ?  ?Mickie Hillier, PA-C ?08/02/21 2037 ? ?

## 2021-08-03 NOTE — ED Notes (Signed)
Called patient x3 she left ?

## 2021-08-07 ENCOUNTER — Other Ambulatory Visit: Payer: Self-pay | Admitting: Registered Nurse

## 2021-08-07 DIAGNOSIS — R062 Wheezing: Secondary | ICD-10-CM

## 2021-08-07 DIAGNOSIS — R0602 Shortness of breath: Secondary | ICD-10-CM

## 2021-08-08 ENCOUNTER — Telehealth: Payer: Self-pay

## 2021-08-08 NOTE — Telephone Encounter (Signed)
Patient has not been seen since April 2022. Patient will need to be seen before refills are given. Please schedule  ?

## 2021-08-08 NOTE — Telephone Encounter (Signed)
Albuterol Nebulizer solution ? ? ?[facility] [address] ? ?

## 2021-08-23 ENCOUNTER — Other Ambulatory Visit: Payer: Self-pay | Admitting: Family Medicine

## 2021-08-27 ENCOUNTER — Ambulatory Visit
Admission: EM | Admit: 2021-08-27 | Discharge: 2021-08-27 | Disposition: A | Discharge status: Home / Self Care | Payer: Self-pay | Attending: Emergency Medicine | Admitting: Emergency Medicine

## 2021-08-27 ENCOUNTER — Encounter: Payer: Self-pay | Admitting: Emergency Medicine

## 2021-08-27 DIAGNOSIS — Z760 Encounter for issue of repeat prescription: Secondary | ICD-10-CM

## 2021-08-27 DIAGNOSIS — I10 Essential (primary) hypertension: Secondary | ICD-10-CM

## 2021-08-27 MED ORDER — HYDROCHLOROTHIAZIDE 25 MG PO TABS: 25.0000 mg | ORAL_TABLET | Freq: Every day | ORAL | 0 refills | Status: DC

## 2021-08-27 NOTE — Discharge Instructions (Addendum)
Establish a primary care provider as soon as possible.   ? ?See the attached information on managing your hypertension.   ? ? ?

## 2021-08-27 NOTE — ED Triage Notes (Signed)
Pt has hypertension and she is out of her HCTZ. Her PCP has left the location and she is in the process of looking for a provider ?

## 2021-08-27 NOTE — ED Provider Notes (Signed)
?[facility] ? ? ? ?CSN: [id number] ?Arrival date & time: 08/27/21  1814 ? ? ?  ? ?History   ?Chief Complaint ?Chief Complaint  ?Patient presents with  ? Hypertension  ? ? ?HPI ?Robin Fox is a 46 y.o. female.  Patient presents with request for refill of her HCTZ which she takes for hypertension.  She has been out of this medication for 3 days.  She denies chest pain, shortness of breath, weakness, dizziness, headache, or other symptoms.  She does not currently have a PCP as her previous one left practice.  Her medical history includes hypertension and reactive airway disease. ? ?The history is provided by the patient and medical records.  ? ?Past Medical History:  ?Diagnosis Date  ? Anemia   ? hx   ? Gestational diabetes   ? Neg 2 hr test after preg  ? Headache(784.0)   ? Herpes genitalia   ? 2013 - 1st outbreak  ? Hypertension   ? 2008  ? Pre-eclampsia in third trimester   ? Seasonal allergies   ? Shortness of breath   ? w exertion  ? Trichomonas   ? Twins   ? ? ?Patient Active Problem List  ? Diagnosis Date Noted  ? Reactive airway disease 08/14/2020  ? Pancreatitis 08/13/2020  ? Screening breast examination 03/30/2019  ? Breast pain 03/30/2019  ? Tobacco use 08/01/2016  ? Routine postpartum follow-up 05/22/2012  ? Mild or unspecified pre-eclampsia, with delivery 04/07/2012  ? Abnormal maternal glucose tolerance, antepartum 03/27/2012  ? History of IUGR (intrauterine growth retardation) and stillbirth, currently pregnant 02/12/2012  ? History of stillbirth 12/12/2011  ? Previous cesarean delivery, antepartum condition or complication 86/76/7209  ? Obesity 11/21/2011  ? Hx of herpes genitalis 11/21/2011  ? Hypertension in pregnancy, essential, antepartum 11/09/2011  ? Essential hypertension 11/09/2011  ? AMA (advanced maternal age) multigravida 35+ 11/09/2011  ? ? ?Past Surgical History:  ?Procedure Laterality Date  ? CESAREAN SECTION    ? X 2  ? CESAREAN SECTION  04/21/2012  ? Procedure:  CESAREAN SECTION;  Surgeon: Woodroe Mode, MD;  Location: [facility];  Service: Obstetrics;  Laterality: N/A;  ? TOOTH EXTRACTION    ? ? ?OB History   ? ? Gravida  ?4  ? Para  ?3  ? Term  ?1  ? Preterm  ?2  ? AB  ?0  ? Living  ?4  ?  ? ? SAB  ?0  ? IAB  ?0  ? Ectopic  ?0  ? Multiple  ?2  ? Live Births  ?4  ?   ?  ?  ? ? ? ?Home Medications   ? ?Prior to Admission medications   ?Medication Sig Start Date End Date Taking? Authorizing Provider  ?valACYclovir (VALTREX) 500 MG tablet TAKE 1 TABLET BY MOUTH ONCE DAILY INCREASE  TO  TWICE  DAILY  FOR  3  DAYS  DURING  OUTBREAK 06/26/20  Yes [name], MD  ?albuterol (PROVENTIL) (2.5 MG/3ML) 0.083% nebulizer solution USE 1 VIAL IN NEBULIZER EVERY 6 HOURS AS NEEDED FOR WHEEZING FOR SHORTNESS OF BREATH ?Patient taking differently: Take 2.5 mg by nebulization every 6 (six) hours as needed for shortness of breath. 02/28/21   [name], NP  ?albuterol (VENTOLIN HFA) 108 (90 Base) MCG/ACT inhaler Inhale 1-2 puffs into the lungs every 6 (six) hours as needed for wheezing or shortness of breath. 05/05/20   Wieters, Hallie C, PA-C  ?alum & mag hydroxide-simeth (  MAALOX/MYLANTA) 200-200-20 MG/5ML suspension Take 15 mLs by mouth every 6 (six) hours as needed for indigestion or heartburn. ?Patient not taking: Reported on 06/18/2021 08/11/20   Wieters, Madelynn Done C, PA-C  ?famotidine (PEPCID) 20 MG tablet Take 1 tablet (20 mg total) by mouth 2 (two) times daily as needed for heartburn or indigestion. ?Patient not taking: Reported on 06/18/2021 08/28/20   Vevelyn Francois, NP  ?hydrochlorothiazide (HYDRODIURIL) 25 MG tablet Take 1 tablet (25 mg total) by mouth daily. 08/27/21   Sharion Balloon, NP  ?metroNIDAZOLE (FLAGYL) 500 MG tablet Take 1 tablet (500 mg total) by mouth 2 (two) times daily. 06/15/21   LampteyMyrene Galas, MD  ?montelukast (SINGULAIR) 10 MG tablet Take 1 tablet (10 mg total) by mouth at bedtime. ?Patient not taking: Reported on 06/18/2021 06/26/20   [name], MD   ?ondansetron (ZOFRAN ODT) 4 MG disintegrating tablet Take 1 tablet (4 mg total) by mouth every 8 (eight) hours as needed for nausea or vomiting. ?Patient not taking: Reported on 06/18/2021 08/28/20   Vevelyn Francois, NP  ?predniSONE (DELTASONE) 10 MG tablet Take 2 tablets (20 mg total) by mouth 2 (two) times daily. ?Patient not taking: Reported on 06/18/2021 02/21/21   Veryl Speak, MD  ?traMADol (ULTRAM) 50 MG tablet Take 1 tablet (50 mg total) by mouth every 6 (six) hours as needed. ?Patient not taking: Reported on 06/18/2021 08/23/20   Tegeler, Gwenyth Allegra, MD  ? ? ?Family History ?Family History  ?Problem Relation Age of Onset  ? Hypertension Mother   ? Stroke Mother   ? Stroke Maternal Grandmother   ? Heart disease Maternal Grandfather 60  ? Hypertension Brother   ? Diabetes Brother   ? Stomach cancer Brother   ? Hypertension Brother   ? Diabetes Brother   ? ? ?Social History ?Social History  ? ?Tobacco Use  ? Smoking status: Former  ?  Packs/day: 0.25  ?  Years: 17.00  ?  Pack years: 4.25  ?  Types: Cigarettes  ?  Quit date: 09/11/2019  ?  Years since quitting: 1.9  ? Smokeless tobacco: Never  ?Vaping Use  ? Vaping Use: Never used  ?Substance Use Topics  ? Alcohol use: Yes  ? Drug use: No  ? ? ? ?Allergies   ?Penicillins ? ? ?Review of Systems ?Review of Systems  ?Eyes:  Negative for pain and visual disturbance.  ?Respiratory:  Negative for cough and shortness of breath.   ?Cardiovascular:  Negative for chest pain and palpitations.  ?Skin:  Negative for color change and rash.  ?Neurological:  Negative for dizziness, weakness, numbness and headaches.  ?All other systems reviewed and are negative. ? ? ?Physical Exam ?Triage Vital Signs ?ED Triage Vitals [08/27/21 1820]  ?Enc Vitals Group  ?   BP   ?   Pulse   ?   Resp   ?   Temp   ?   Temp src   ?   SpO2   ?   Weight   ?   Height   ?   Head Circumference   ?   Peak Flow   ?   Pain Score 0  ?   Pain Loc   ?   Pain Edu?   ?   Excl. in [place]?   ? ?No data  found. ? ?Updated Vital Signs ?BP 132/87 (BP Location: Left Arm)   Pulse 82   Temp 98.7 ?F (37.1 ?C) (Oral)   Resp 18  LMP 08/19/2021   SpO2 97%  ? ?Visual Acuity ?Right Eye Distance:   ?Left Eye Distance:   ?Bilateral Distance:   ? ?Right Eye Near:   ?Left Eye Near:    ?Bilateral Near:    ? ?Physical Exam ?Vitals and nursing note reviewed.  ?Constitutional:   ?   General: She is not in acute distress. ?   Appearance: She is well-developed. She is not ill-appearing.  ?HENT:  ?   Mouth/Throat:  ?   Mouth: Mucous membranes are moist.  ?Cardiovascular:  ?   Rate and Rhythm: Normal rate and regular rhythm.  ?   Heart sounds: Normal heart sounds.  ?Pulmonary:  ?   Effort: Pulmonary effort is normal. No respiratory distress.  ?   Breath sounds: Normal breath sounds.  ?Musculoskeletal:  ?   Cervical back: Neck supple.  ?   Right lower leg: No edema.  ?   Left lower leg: No edema.  ?Skin: ?   General: Skin is warm and dry.  ?Neurological:  ?   Mental Status: She is alert.  ?Psychiatric:     ?   Mood and Affect: Mood normal.     ?   Behavior: Behavior normal.  ? ? ? ?UC Treatments / Results  ?Labs ?(all labs ordered are listed, but only abnormal results are displayed) ?Labs Reviewed - No data to display ? ?EKG ? ? ?Radiology ?No results found. ? ?Procedures ?Procedures (including critical care time) ? ?Medications Ordered in UC ?Medications - No data to display ? ?Initial Impression / Assessment and Plan / UC Course  ?I have reviewed the triage vital signs and the nursing notes. ? ?Pertinent labs & imaging results that were available during my care of the patient were reviewed by me and considered in my medical decision making (see chart for details). ? ?Encounter for medication refill, hypertension.  Patient does not currently have a PCP.  30-day supply of HCTZ sent to pharmacy.  Education provided on managing hypertension.  [facility] assistance with establishing a PCP requested.  Patient agrees to plan of  care. ? ? ?Final Clinical Impressions(s) / UC Diagnoses  ? ?Final diagnoses:  ?Encounter for medication refill  ?Essential hypertension  ? ? ? ?Discharge Instructions   ? ?  ?Establish a primary care provider as soon as possible.   ? ?See the a

## 2021-10-02 ENCOUNTER — Ambulatory Visit
Admission: EM | Admit: 2021-10-02 | Discharge: 2021-10-02 | Disposition: A | Discharge status: Home / Self Care | Payer: Self-pay | Attending: Student | Admitting: Student

## 2021-10-02 DIAGNOSIS — I10 Essential (primary) hypertension: Secondary | ICD-10-CM

## 2021-10-02 DIAGNOSIS — Z760 Encounter for issue of repeat prescription: Secondary | ICD-10-CM

## 2021-10-02 MED ORDER — HYDROCHLOROTHIAZIDE 25 MG PO TABS: 25.0000 mg | ORAL_TABLET | Freq: Every day | ORAL | 0 refills | Status: DC

## 2021-10-02 NOTE — Discharge Instructions (Addendum)
-  Continue the hctz ?-Establish care with PCP as scheduled  ?

## 2021-10-02 NOTE — ED Triage Notes (Signed)
Patient presents to Urgent Care with complaints of blood pressure medication refill. Pt takes HCTZ and took her last dose today.  ?

## 2021-10-02 NOTE — ED Provider Notes (Signed)
?[facility] ? ? ? ?CSN: [id number] ?Arrival date & time: 10/02/21  1750 ? ? ?  ? ?History   ?Chief Complaint ?Chief Complaint  ?Patient presents with  ? Medication Refill  ? ? ?HPI ?Robin Fox is a 46 y.o. female presenting for medication refill-hydrochlorothiazide, which she has tolerated for months.  Patient states she establishes care with her primary care provider in about 1 month, but is on her last pill of the medication and does not want her blood pressure to get high again.  She is feeling well, without chest pain, shortness of breath, pedal edema. ? ?HPI ? ?Past Medical History:  ?Diagnosis Date  ? Anemia   ? hx   ? Gestational diabetes   ? Neg 2 hr test after preg  ? Headache(784.0)   ? Herpes genitalia   ? 2013 - 1st outbreak  ? Hypertension   ? 2008  ? Pre-eclampsia in third trimester   ? Seasonal allergies   ? Shortness of breath   ? w exertion  ? Trichomonas   ? Twins   ? ? ?Patient Active Problem List  ? Diagnosis Date Noted  ? Reactive airway disease 08/14/2020  ? Pancreatitis 08/13/2020  ? Screening breast examination 03/30/2019  ? Breast pain 03/30/2019  ? Tobacco use 08/01/2016  ? Routine postpartum follow-up 05/22/2012  ? Mild or unspecified pre-eclampsia, with delivery 04/07/2012  ? Abnormal maternal glucose tolerance, antepartum 03/27/2012  ? History of IUGR (intrauterine growth retardation) and stillbirth, currently pregnant 02/12/2012  ? History of stillbirth 12/12/2011  ? Previous cesarean delivery, antepartum condition or complication 40/02/2724  ? Obesity 11/21/2011  ? Hx of herpes genitalis 11/21/2011  ? Hypertension in pregnancy, essential, antepartum 11/09/2011  ? Essential hypertension 11/09/2011  ? AMA (advanced maternal age) multigravida 35+ 11/09/2011  ? ? ?Past Surgical History:  ?Procedure Laterality Date  ? CESAREAN SECTION    ? X 2  ? CESAREAN SECTION  04/21/2012  ? Procedure: CESAREAN SECTION;  Surgeon: Woodroe Mode, MD;  Location: [facility];  Service:  Obstetrics;  Laterality: N/A;  ? TOOTH EXTRACTION    ? ? ?OB History   ? ? Gravida  ?4  ? Para  ?3  ? Term  ?1  ? Preterm  ?2  ? AB  ?0  ? Living  ?4  ?  ? ? SAB  ?0  ? IAB  ?0  ? Ectopic  ?0  ? Multiple  ?2  ? Live Births  ?4  ?   ?  ?  ? ? ? ?Home Medications   ? ?Prior to Admission medications   ?Medication Sig Start Date End Date Taking? Authorizing Provider  ?albuterol (PROVENTIL) (2.5 MG/3ML) 0.083% nebulizer solution USE 1 VIAL IN NEBULIZER EVERY 6 HOURS AS NEEDED FOR WHEEZING FOR SHORTNESS OF BREATH ?Patient taking differently: Take 2.5 mg by nebulization every 6 (six) hours as needed for shortness of breath. 02/28/21   [name], NP  ?albuterol (VENTOLIN HFA) 108 (90 Base) MCG/ACT inhaler Inhale 1-2 puffs into the lungs every 6 (six) hours as needed for wheezing or shortness of breath. 05/05/20   Wieters, Hallie C, PA-C  ?hydrochlorothiazide (HYDRODIURIL) 25 MG tablet Take 1 tablet (25 mg total) by mouth daily. 10/02/21   Hazel Sams, PA-C  ?metroNIDAZOLE (FLAGYL) 500 MG tablet Take 1 tablet (500 mg total) by mouth 2 (two) times daily. 06/15/21   Chase Picket, MD  ?valACYclovir (VALTREX) 500 MG tablet TAKE 1 TABLET BY  MOUTH ONCE DAILY INCREASE  TO  TWICE  DAILY  FOR  3  DAYS  DURING  OUTBREAK 06/26/20   [name], MD  ? ? ?Family History ?Family History  ?Problem Relation Age of Onset  ? Hypertension Mother   ? Stroke Mother   ? Stroke Maternal Grandmother   ? Heart disease Maternal Grandfather 60  ? Hypertension Brother   ? Diabetes Brother   ? Stomach cancer Brother   ? Hypertension Brother   ? Diabetes Brother   ? ? ?Social History ?Social History  ? ?Tobacco Use  ? Smoking status: Former  ?  Packs/day: 0.25  ?  Years: 17.00  ?  Pack years: 4.25  ?  Types: Cigarettes  ?  Quit date: 09/11/2019  ?  Years since quitting: 2.0  ? Smokeless tobacco: Never  ?Vaping Use  ? Vaping Use: Never used  ?Substance Use Topics  ? Alcohol use: Yes  ? Drug use: No  ? ? ? ?Allergies    ?Penicillins ? ? ?Review of Systems ?Review of Systems  ?Constitutional:  Negative for appetite change, chills and fever.  ?HENT:  Negative for congestion, ear pain, rhinorrhea, sinus pressure, sinus pain and sore throat.   ?Eyes:  Negative for redness and visual disturbance.  ?Respiratory:  Negative for cough, chest tightness, shortness of breath and wheezing.   ?Cardiovascular:  Negative for chest pain and palpitations.  ?Gastrointestinal:  Negative for abdominal pain, constipation, diarrhea, nausea and vomiting.  ?Genitourinary:  Negative for dysuria, frequency and urgency.  ?Musculoskeletal:  Negative for myalgias.  ?Neurological:  Negative for dizziness, weakness and headaches.  ?Psychiatric/Behavioral:  Negative for confusion.   ?All other systems reviewed and are negative. ? ? ?Physical Exam ?Triage Vital Signs ?ED Triage Vitals [10/02/21 1817]  ?Enc Vitals Group  ?   BP 130/90  ?   Pulse Rate 78  ?   Resp 18  ?   Temp 98.2 ?F (36.8 ?C)  ?   Temp Source Temporal  ?   SpO2 98 %  ?   Weight   ?   Height   ?   Head Circumference   ?   Peak Flow   ?   Pain Score 0  ?   Pain Loc   ?   Pain Edu?   ?   Excl. in [place]?   ? ?No data found. ? ?Updated Vital Signs ?BP 130/90 (BP Location: Left Arm)   Pulse 78   Temp 98.2 ?F (36.8 ?C) (Temporal)   Resp 18   SpO2 98%  ? ?Visual Acuity ?Right Eye Distance:   ?Left Eye Distance:   ?Bilateral Distance:   ? ?Right Eye Near:   ?Left Eye Near:    ?Bilateral Near:    ? ?Physical Exam ?Vitals reviewed.  ?Constitutional:   ?   Appearance: Normal appearance. She is not diaphoretic.  ?HENT:  ?   Head: Normocephalic and atraumatic.  ?   Mouth/Throat:  ?   Mouth: Mucous membranes are moist.  ?Eyes:  ?   Extraocular Movements: Extraocular movements intact.  ?   Pupils: Pupils are equal, round, and reactive to light.  ?Cardiovascular:  ?   Rate and Rhythm: Normal rate and regular rhythm.  ?   Pulses:     ?     Radial pulses are 2+ on the right side and 2+ on the left side.  ?   Heart  sounds: Normal heart sounds.  ?Pulmonary:  ?  Effort: Pulmonary effort is normal.  ?   Breath sounds: Normal breath sounds.  ?Abdominal:  ?   Palpations: Abdomen is soft.  ?   Tenderness: There is no abdominal tenderness. There is no guarding or rebound.  ?Musculoskeletal:  ?   Right lower leg: No edema.  ?   Left lower leg: No edema.  ?Skin: ?   General: Skin is warm.  ?   Capillary Refill: Capillary refill takes less than 2 seconds.  ?Neurological:  ?   General: No focal deficit present.  ?   Mental Status: She is alert and oriented to person, place, and time.  ?Psychiatric:     ?   Mood and Affect: Mood normal.     ?   Behavior: Behavior normal.     ?   Thought Content: Thought content normal.     ?   Judgment: Judgment normal.  ? ? ? ?UC Treatments / Results  ?Labs ?(all labs ordered are listed, but only abnormal results are displayed) ?Labs Reviewed - No data to display ? ?EKG ? ? ?Radiology ?No results found. ? ?Procedures ?Procedures (including critical care time) ? ?Medications Ordered in UC ?Medications - No data to display ? ?Initial Impression / Assessment and Plan / UC Course  ?I have reviewed the triage vital signs and the nursing notes. ? ?Pertinent labs & imaging results that were available during my care of the patient were reviewed by me and considered in my medical decision making (see chart for details). ? ?  ? ?This patient is a very pleasant 46 y.o. year old female presenting with medication refill - hypertension. BP is well controlled on the hctz '25mg'$  qd; refilled x90 days. She will establish care with PCP before this runs out. ED return precautions discussed. Patient verbalizes understanding and agreement.  ?.  ? ?Final Clinical Impressions(s) / UC Diagnoses  ? ?Final diagnoses:  ?Medication refill  ?Essential hypertension  ? ? ? ?Discharge Instructions   ? ?  ?-Continue the hctz ?-Establish care with PCP as scheduled  ? ? ?ED Prescriptions   ? ? Medication Sig Dispense Auth. Provider  ?  hydrochlorothiazide (HYDRODIURIL) 25 MG tablet Take 1 tablet (25 mg total) by mouth daily. 90 tablet Hazel Sams, PA-C  ? ?  ? ?PDMP not reviewed this encounter. ?  ?Hazel Sams, PA-C ?10/02/21 1845 ? ?

## 2022-01-08 ENCOUNTER — Ambulatory Visit
Admission: EM | Admit: 2022-01-08 | Discharge: 2022-01-08 | Disposition: A | Discharge status: Home / Self Care | Payer: Self-pay | Attending: Family Medicine | Admitting: Family Medicine

## 2022-01-08 DIAGNOSIS — Z20822 Contact with and (suspected) exposure to covid-19: Secondary | ICD-10-CM | POA: Insufficient documentation

## 2022-01-08 LAB — SARS CORONAVIRUS 2 BY RT PCR: SARS Coronavirus 2 by RT PCR: NEGATIVE

## 2022-01-08 NOTE — Discharge Instructions (Signed)
Your COVID 19 results will be available in 6-12 hours. Negative results are immediately resulted to Mychart. Positive results will receive a follow-up call from our clinic. If symptoms are present, I recommend home quarantine until results are known.

## 2022-01-08 NOTE — ED Triage Notes (Signed)
Seen by provider

## 2022-01-08 NOTE — ED Provider Notes (Signed)
[name]    CSN: [id number] Arrival date & time: 01/08/22  1553      History   Chief Complaint No chief complaint on file.   HPI Robin Fox is a 46 y.o. female.   HPI Patient here for COVID-19 testing for travel. She has no URI or GI symptoms. She is traveling to [place] on cruise.   Past Medical History:  Diagnosis Date   Anemia    hx    Gestational diabetes    Neg 2 hr test after preg   Headache(784.0)    Herpes genitalia    2013 - 1st outbreak   Hypertension    2008   Pre-eclampsia in third trimester    Seasonal allergies    Shortness of breath    w exertion   Trichomonas    Twins     Patient Active Problem List   Diagnosis Date Noted   Reactive airway disease 08/14/2020   Pancreatitis 08/13/2020   Screening breast examination 03/30/2019   Breast pain 03/30/2019   Tobacco use 08/01/2016   Routine postpartum follow-up 05/22/2012   Mild or unspecified pre-eclampsia, with delivery 04/07/2012   Abnormal maternal glucose tolerance, antepartum 03/27/2012   History of IUGR (intrauterine growth retardation) and stillbirth, currently pregnant 02/12/2012   History of stillbirth 12/12/2011   Previous cesarean delivery, antepartum condition or complication 45/80/9983   Obesity 11/21/2011   Hx of herpes genitalis 11/21/2011   Hypertension in pregnancy, essential, antepartum 11/09/2011   Essential hypertension 11/09/2011   AMA (advanced maternal age) multigravida 35+ 11/09/2011    Past Surgical History:  Procedure Laterality Date   CESAREAN SECTION     X 2   CESAREAN SECTION  04/21/2012   Procedure: CESAREAN SECTION;  Surgeon: Woodroe Mode, MD;  Location: [facility];  Service: Obstetrics;  Laterality: N/A;   TOOTH EXTRACTION      OB History     Gravida  4   Para  3   Term  1   Preterm  2   AB  0   Living  4      SAB  0   IAB  0   Ectopic  0   Multiple  2   Live Births  4            Home Medications     Prior to Admission medications   Medication Sig Start Date End Date Taking? Authorizing Provider  albuterol (PROVENTIL) (2.5 MG/3ML) 0.083% nebulizer solution USE 1 VIAL IN NEBULIZER EVERY 6 HOURS AS NEEDED FOR WHEEZING FOR SHORTNESS OF BREATH Patient taking differently: Take 2.5 mg by nebulization every 6 (six) hours as needed for shortness of breath. 02/28/21   [name], NP  albuterol (VENTOLIN HFA) 108 (90 Base) MCG/ACT inhaler Inhale 1-2 puffs into the lungs every 6 (six) hours as needed for wheezing or shortness of breath. 05/05/20   Wieters, Hallie C, PA-C  hydrochlorothiazide (HYDRODIURIL) 25 MG tablet Take 1 tablet (25 mg total) by mouth daily. 10/02/21   Hazel Sams, PA-C  metroNIDAZOLE (FLAGYL) 500 MG tablet Take 1 tablet (500 mg total) by mouth 2 (two) times daily. 06/15/21   Chase Picket, MD  valACYclovir (VALTREX) 500 MG tablet TAKE 1 TABLET BY MOUTH ONCE DAILY INCREASE  TO  TWICE  DAILY  FOR  3  DAYS  DURING  OUTBREAK 06/26/20   [name], MD    Family History Family History  Problem Relation Age of Onset  Hypertension Mother    Stroke Mother    Stroke Maternal Grandmother    Heart disease Maternal Grandfather 41   Hypertension Brother    Diabetes Brother    Stomach cancer Brother    Hypertension Brother    Diabetes Brother     Social History Social History   Tobacco Use   Smoking status: Former    Packs/day: 0.25    Years: 17.00    Total pack years: 4.25    Types: Cigarettes    Quit date: 09/11/2019    Years since quitting: 2.3   Smokeless tobacco: Never  Vaping Use   Vaping Use: Never used  Substance Use Topics   Alcohol use: Yes   Drug use: No     Allergies   Penicillins   Review of Systems Review of Systems Pertinent negatives listed in HPI  Physical Exam Triage Vital Signs ED Triage Vitals [01/08/22 1606]  Enc Vitals Group     BP 129/86     Pulse Rate 76     Resp 18     Temp 98.3 F (36.8 C)     Temp Source Oral      SpO2 97 %     Weight      Height      Head Circumference      Peak Flow      Pain Score      Pain Loc      Pain Edu?      Excl. in [place]?    No data found.  Updated Vital Signs BP 129/86 (BP Location: Left Arm)   Pulse 76   Temp 98.3 F (36.8 C) (Oral)   Resp 18   SpO2 97%   Visual Acuity Right Eye Distance:   Left Eye Distance:   Bilateral Distance:    Right Eye Near:   Left Eye Near:    Bilateral Near:     Physical Exam General appearance: Alert, well developed, well nourished, cooperative  Head: Normocephalic, without obvious abnormality, atraumatic Heart: Rate and rhythm normal.  Respiratory: Respirations even and unlabored, normal respiratory rate Skin: Skin color, texture, turgor normal. No rashes seen  Psych: Appropriate mood and affect. Neurologic: No obvious neurological deficit present   UC Treatments / Results  Labs (all labs ordered are listed, but only abnormal results are displayed) Labs Reviewed  SARS CORONAVIRUS 2 BY RT PCR    EKG   Radiology No results found.  Procedures Procedures (including critical care time)  Medications Ordered in UC Medications - No data to display  Initial Impression / Assessment and Plan / UC Course  I have reviewed the triage vital signs and the nursing notes.  Pertinent labs & imaging results that were available during my care of the patient were reviewed by me and considered in my medical decision making (see chart for details).     COVID testing only. Advised results will be viewable via My Chart. Patient verbalized understanding and agreement with plan Final Clinical Impressions(s) / UC Diagnoses   Final diagnoses:  Encounter for laboratory testing for COVID-19 virus     Discharge Instructions      Your COVID 19 results will be available in 6-12 hours. Negative results are immediately resulted to Mychart. Positive results will receive a follow-up call from our clinic. If symptoms are present, I  recommend home quarantine until results are known.     ED Prescriptions   None    PDMP not reviewed this encounter.  Scot Jun, FNP 01/08/22 1622

## 2022-02-02 IMAGING — DX DG ELBOW COMPLETE 3+V*R*
4 series · 4 of 4 positions shown · non-contrast
Comparison: None.

CLINICAL DATA: 43-year-old female with right elbow pain.

EXAM:
RIGHT ELBOW - COMPLETE 3+ VIEW

[elbow ap]
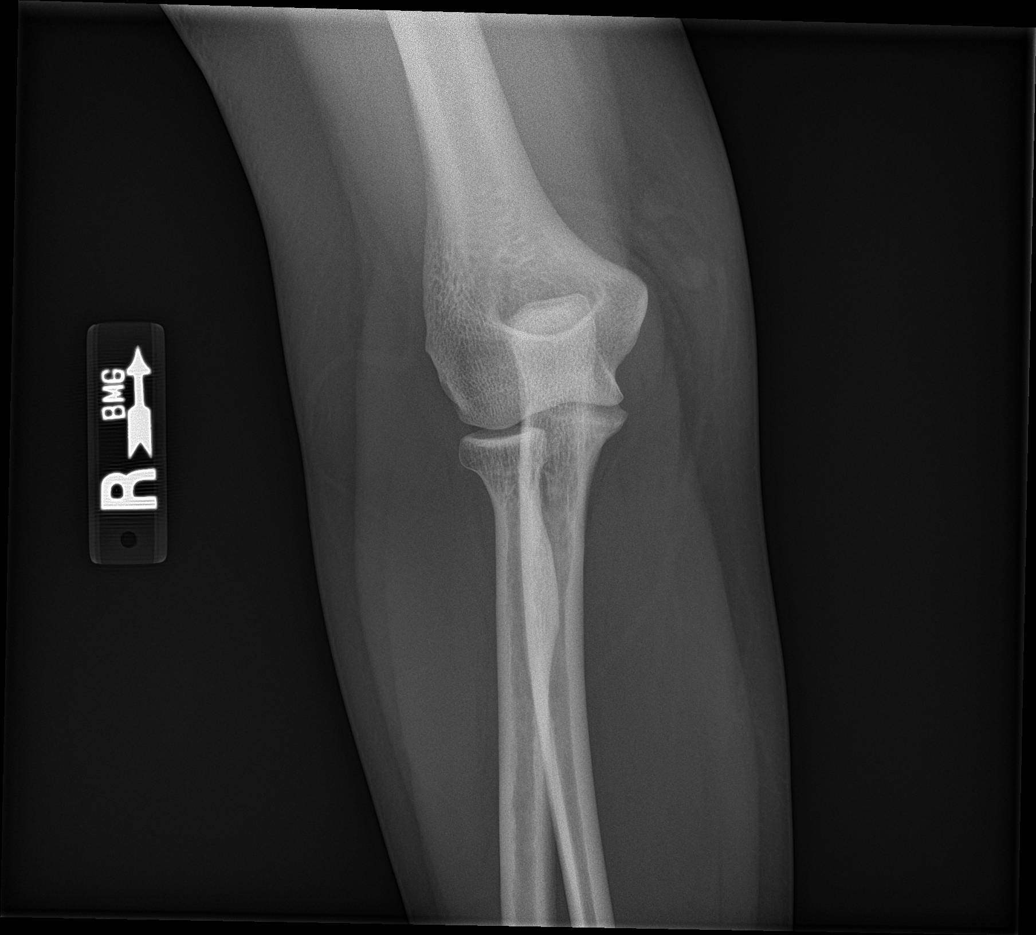

[elbow obl (1 of 2)]
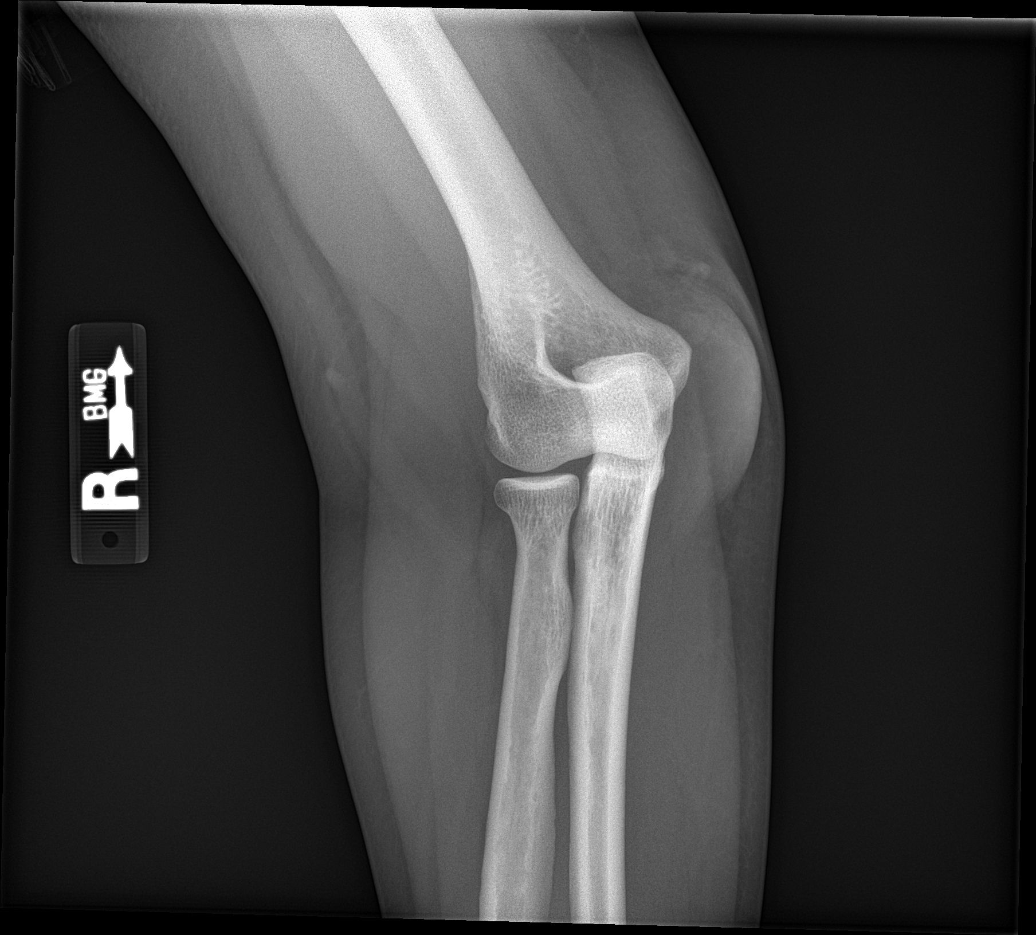

[elbow obl (2 of 2)]
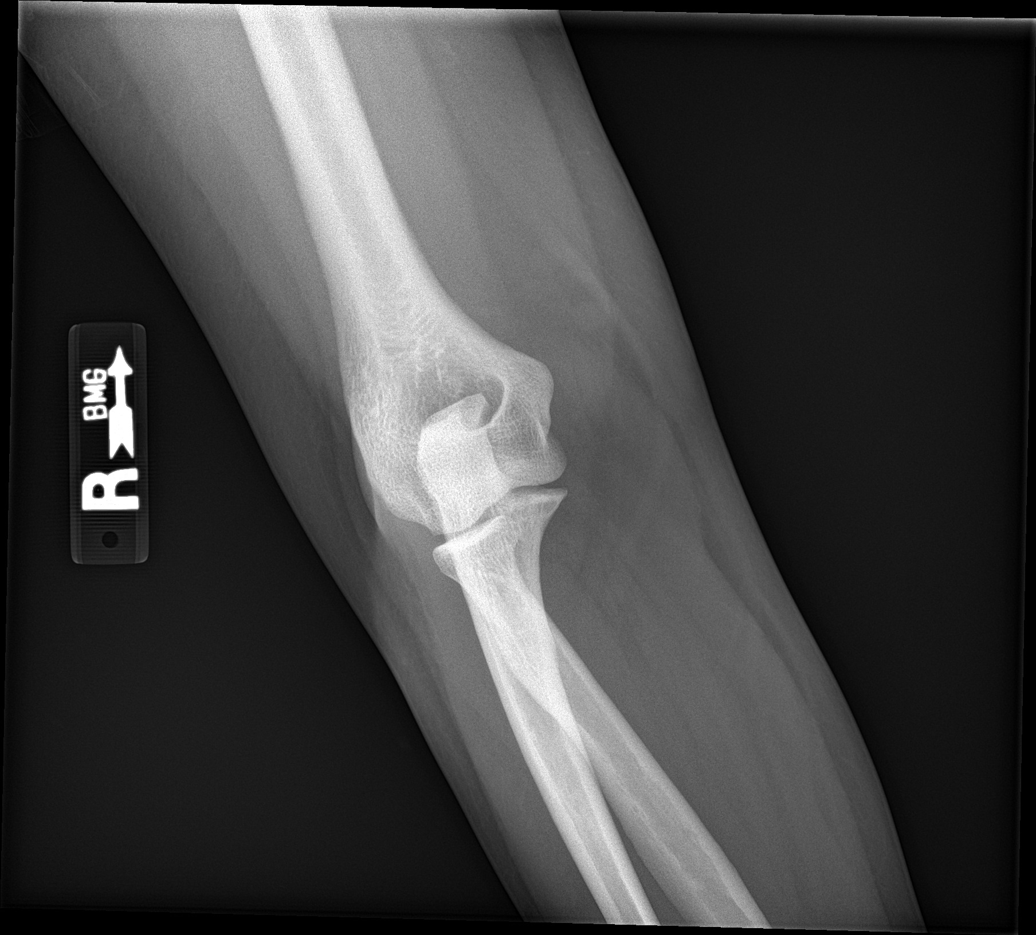

[elbow lat]
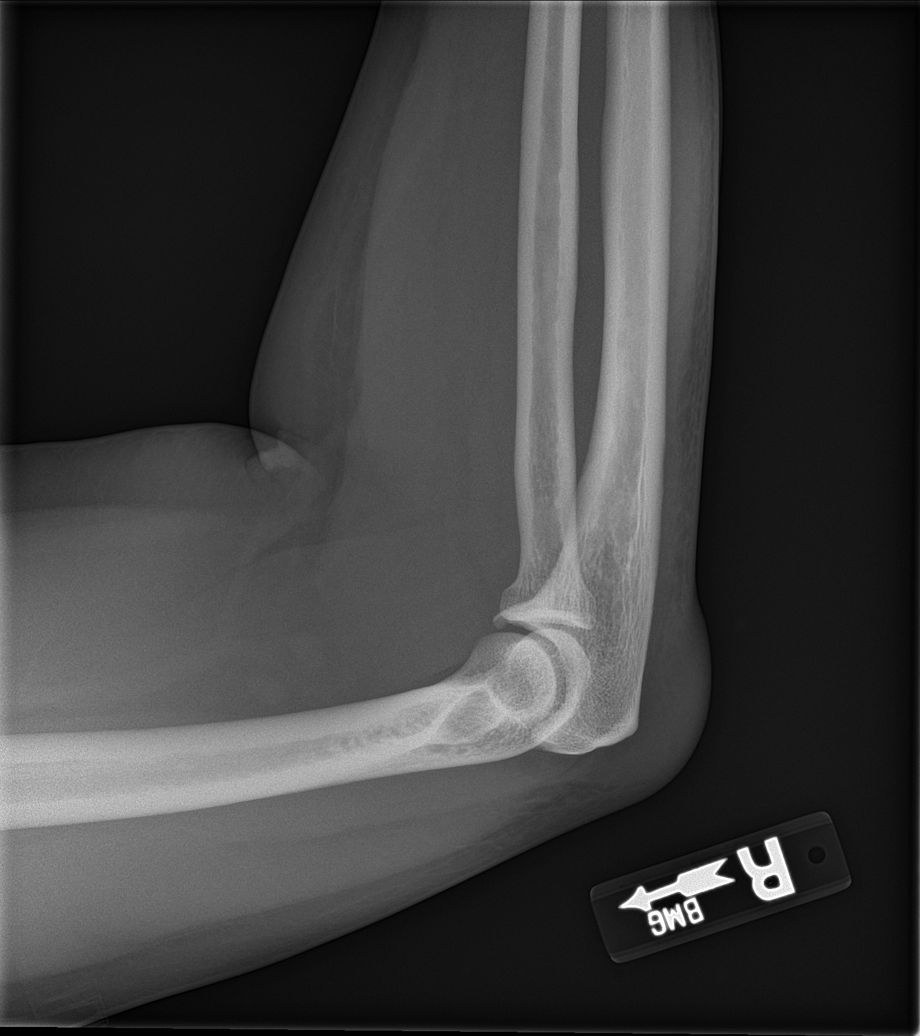

[4 of 4 positions shown; findings below may reference images not displayed]

FINDINGS: There is no evidence of fracture, dislocation, or joint effusion.
There is no evidence of arthropathy or other focal bone abnormality.
Soft tissues are unremarkable.
IMPRESSION: Negative.

## 2022-02-02 IMAGING — DX DG SACRUM/COCCYX 2+V
3 series · 3 of 3 positions shown · non-contrast
Comparison: None.

CLINICAL DATA: 43-year-old female with low back and sacral pain.

EXAM:
PELVIS - 1-2 VIEW; SACRUM AND COCCYX - 2+ VIEW

[coccyx ap]
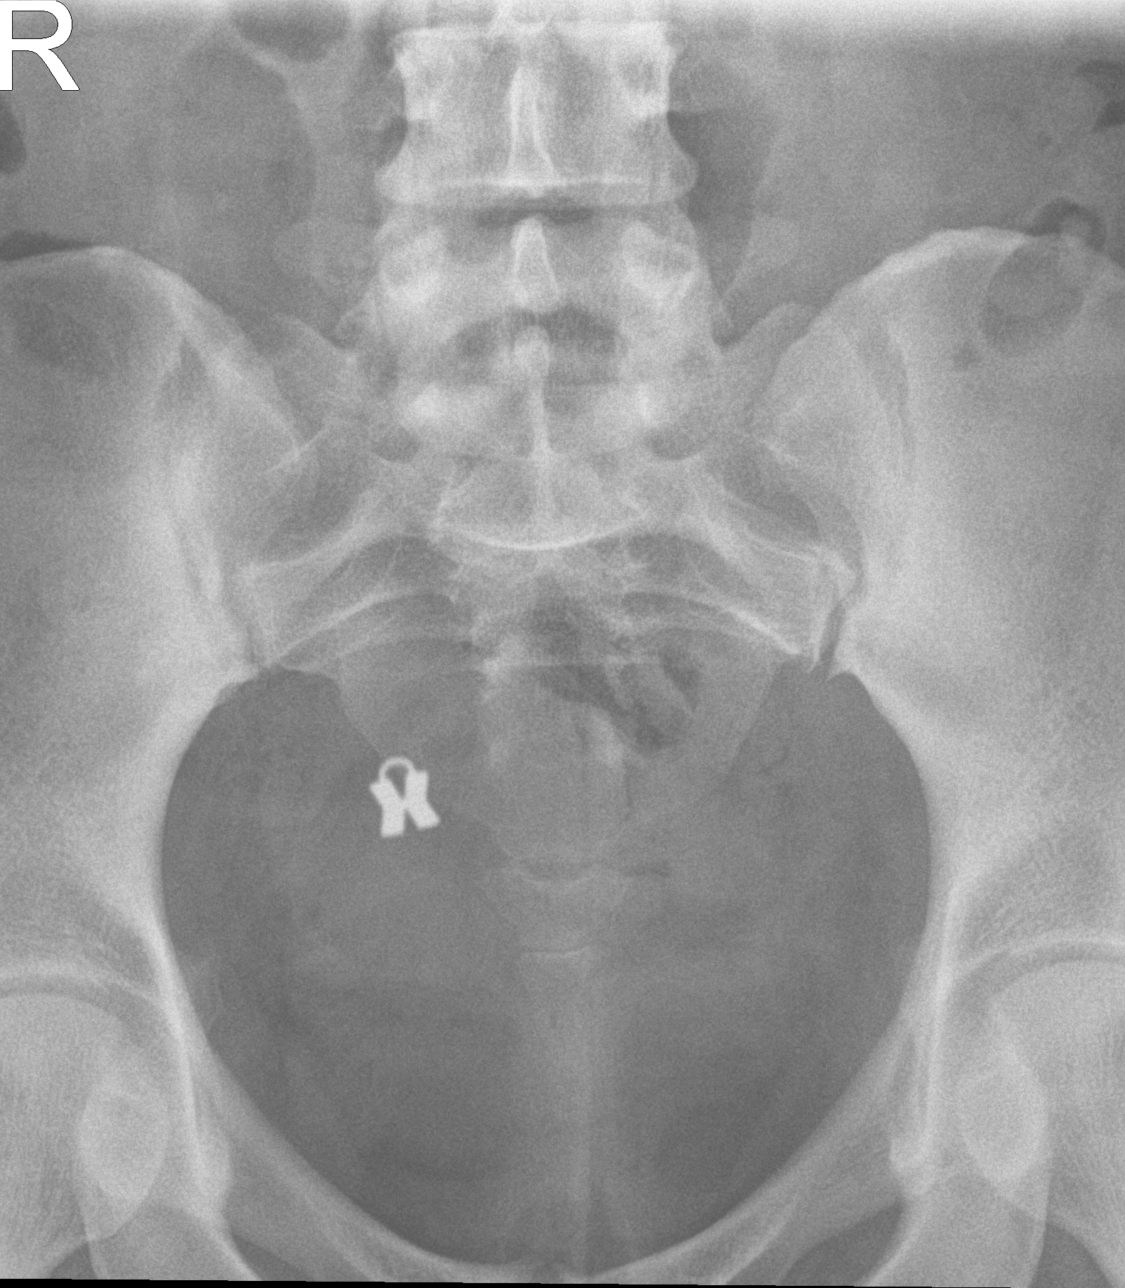

[sacrum ap]
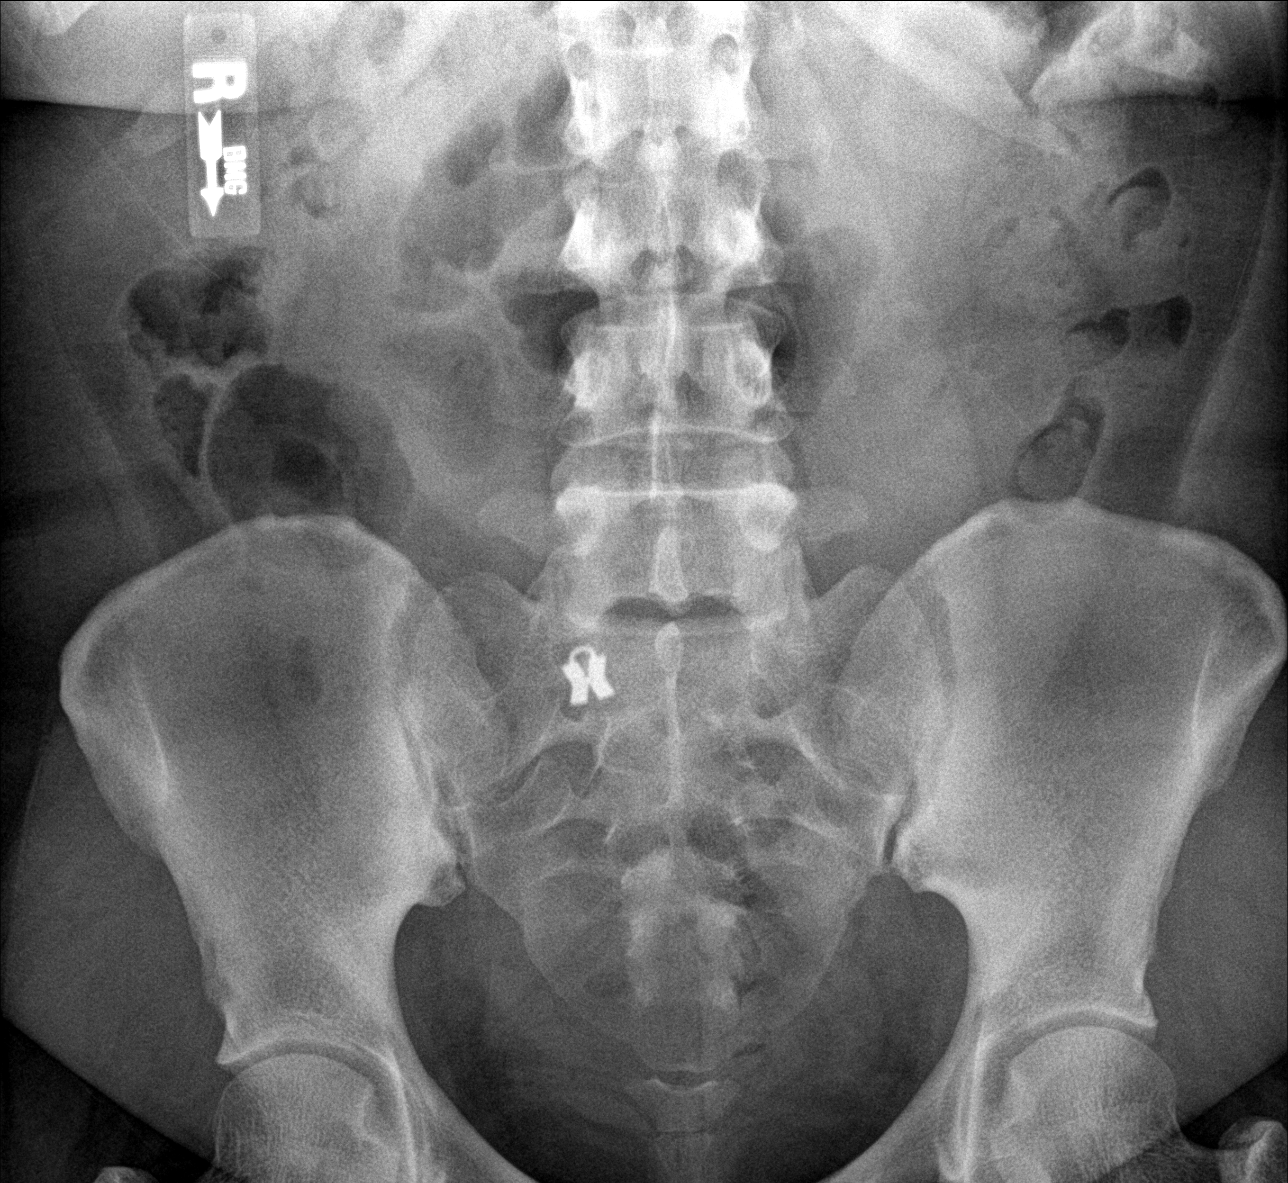

[sacrum lat]
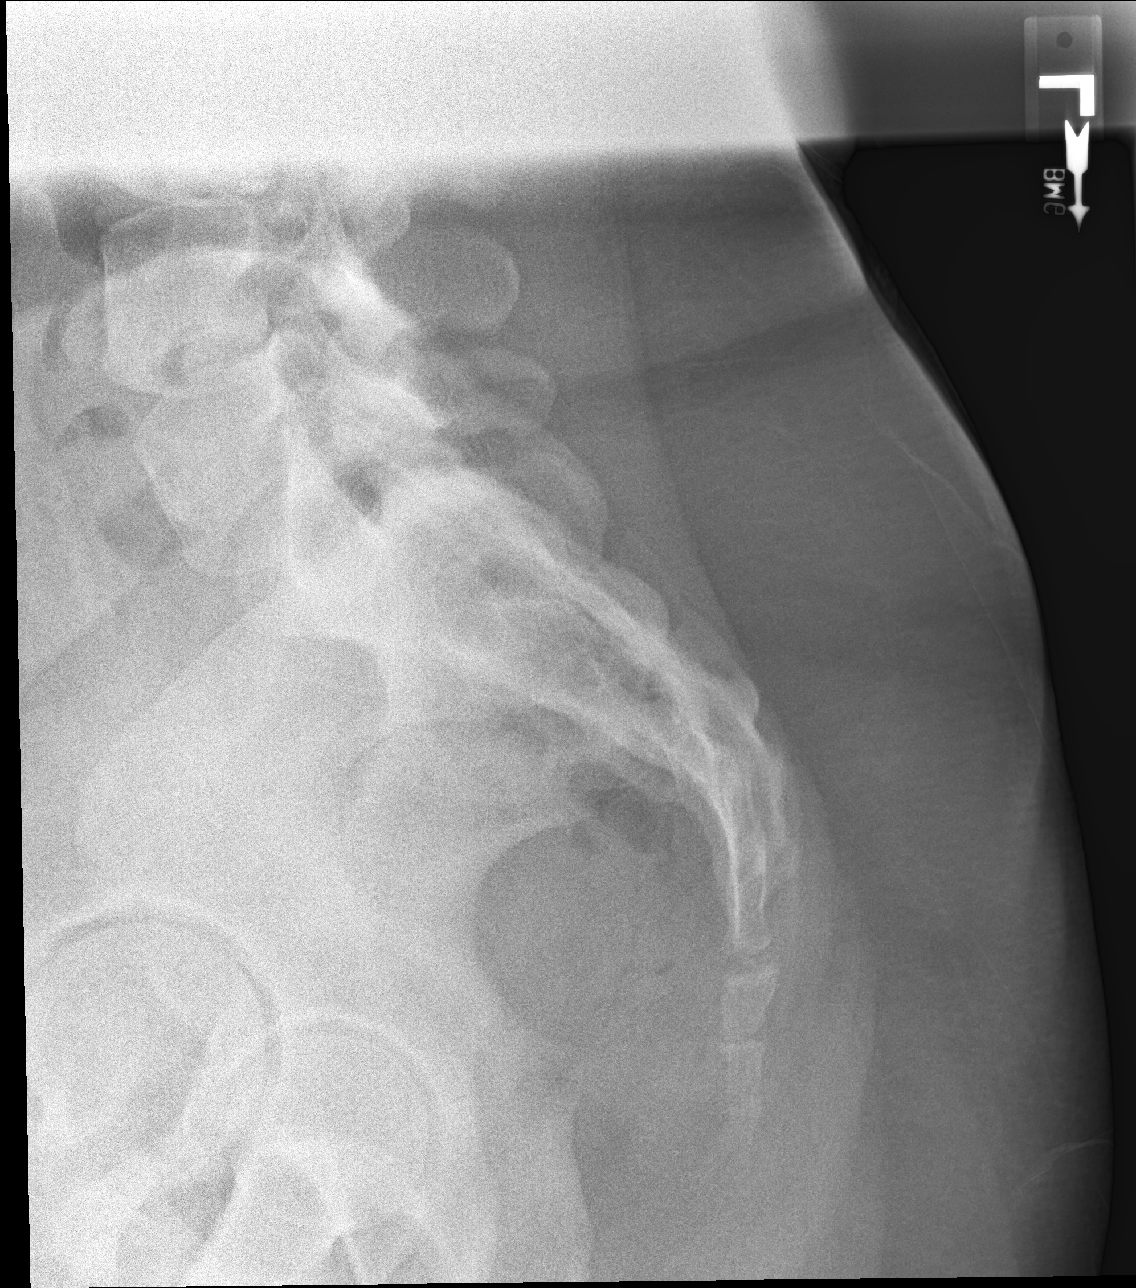

[3 of 3 positions shown; findings below may reference images not displayed]

FINDINGS: There is no evidence of pelvic fracture or diastasis. No pelvic bone
lesions are seen.
IMPRESSION: Negative.

## 2022-02-25 ENCOUNTER — Telehealth: Payer: Self-pay

## 2022-02-25 NOTE — Telephone Encounter (Signed)
Telephoned patient, mailbox full, unable to leave a message from [employer] (scholarship)

## 2022-06-16 ENCOUNTER — Ambulatory Visit
Admission: EM | Admit: 2022-06-16 | Discharge: 2022-06-16 | Disposition: A | Discharge status: Home / Self Care | Payer: BC Managed Care – PPO | Attending: Urgent Care | Admitting: Urgent Care

## 2022-06-16 DIAGNOSIS — R112 Nausea with vomiting, unspecified: Secondary | ICD-10-CM

## 2022-06-16 DIAGNOSIS — R197 Diarrhea, unspecified: Secondary | ICD-10-CM

## 2022-06-16 DIAGNOSIS — A084 Viral intestinal infection, unspecified: Secondary | ICD-10-CM

## 2022-06-16 MED ORDER — ONDANSETRON 4 MG PO TBDP
4.0000 mg | ORAL_TABLET | Freq: Once | ORAL | Status: AC
  Administered 2022-06-16: 4 mg via ORAL

## 2022-06-16 MED ORDER — ONDANSETRON 4 MG PO TBDP: 4.0000 mg | ORAL_TABLET | Freq: Three times a day (TID) | ORAL | 0 refills | Status: DC | PRN

## 2022-06-16 NOTE — ED Triage Notes (Signed)
Pt. Presents to UC w/ c/o diarrhea, nausea and gi upset since this morning.

## 2022-06-16 NOTE — Discharge Instructions (Signed)
If your diarrhea continues until tomorrow, you may start using over-the-counter Imodium to reduce the episodes of diarrhea.  Follow the directions on the box.  Use the nausea medicine to help you drink adequate fluids.  Follow up here or with your primary care provider if your symptoms are worsening or not improving.

## 2022-06-16 NOTE — ED Provider Notes (Signed)
Robin Fox    CSN: [id number] Arrival date & time: 06/16/22  1110      History   Chief Complaint Chief Complaint  Patient presents with   Diarrhea   GI Problem   Nausea    HPI Robin Fox is a 47 y.o. female.    Diarrhea GI Problem  Presents to urgent care with concern for diarrhea, nausea, GI cramping, vomiting since this morning.  She is concerned for food poisoning versus ingestion of toxin at work.  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  She states no one else in her household is ill.  Past Medical History:  Diagnosis Date   Anemia    hx    Gestational diabetes    Neg 2 hr test after preg   Headache(784.0)    Herpes genitalia    2013 - 1st outbreak   Hypertension    2008   Pre-eclampsia in third trimester    Seasonal allergies    Shortness of breath    w exertion   Trichomonas    Twins     Patient Active Problem List   Diagnosis Date Noted   Reactive airway disease 08/14/2020   Pancreatitis 08/13/2020   Screening breast examination 03/30/2019   Breast pain 03/30/2019   Tobacco use 08/01/2016   Routine postpartum follow-up 05/22/2012   Mild or unspecified pre-eclampsia, with delivery 04/07/2012   Abnormal maternal glucose tolerance, antepartum 03/27/2012   History of IUGR (intrauterine growth retardation) and stillbirth, currently pregnant 02/12/2012   History of stillbirth 12/12/2011   Previous cesarean delivery, antepartum condition or complication 62/69/4854   Obesity 11/21/2011   Hx of herpes genitalis 11/21/2011   Hypertension in pregnancy, essential, antepartum 11/09/2011   Essential hypertension 11/09/2011   AMA (advanced maternal age) multigravida 35+ 11/09/2011    Past Surgical History:  Procedure Laterality Date   CESAREAN SECTION     X 2   CESAREAN SECTION  04/21/2012   Procedure: CESAREAN SECTION;  Surgeon:  Woodroe Mode, MD;  Location: [facility];  Service: Obstetrics;  Laterality: N/A;   TOOTH EXTRACTION      OB History     Gravida  4   Para  3   Term  1   Preterm  2   AB  0   Living  4      SAB  0   IAB  0   Ectopic  0   Multiple  2   Live Births  4            Home Medications    Prior to Admission medications   Medication Sig Start Date End Date Taking? Authorizing Provider  albuterol (PROVENTIL) (2.5 MG/3ML) 0.083% nebulizer solution USE 1 VIAL IN NEBULIZER EVERY 6 HOURS AS NEEDED FOR WHEEZING FOR SHORTNESS OF BREATH Patient taking differently: Take 2.5 mg by nebulization every 6 (six) hours as needed for shortness of breath. 02/28/21   [name], NP  albuterol (VENTOLIN HFA) 108 (90 Base) MCG/ACT inhaler Inhale 1-2 puffs into the lungs every 6 (six) hours as needed for wheezing or shortness of breath. 05/05/20   Wieters, Hallie C, PA-C  hydrochlorothiazide (HYDRODIURIL) 25 MG tablet Take 1 tablet (25 mg total) by mouth daily. 10/02/21   Hazel Sams, PA-C  metroNIDAZOLE (FLAGYL) 500 MG tablet Take 1 tablet (500 mg total) by mouth 2 (two) times daily. 06/15/21  Chase Picket, MD  valACYclovir (VALTREX) 500 MG tablet TAKE 1 TABLET BY MOUTH ONCE DAILY INCREASE  TO  TWICE  DAILY  FOR  3  DAYS  DURING  OUTBREAK 06/26/20   [name], MD    Family History Family History  Problem Relation Age of Onset   Hypertension Mother    Stroke Mother    Stroke Maternal Grandmother    Heart disease Maternal Grandfather 31   Hypertension Brother    Diabetes Brother    Stomach cancer Brother    Hypertension Brother    Diabetes Brother     Social History Social History   Tobacco Use   Smoking status: Former    Packs/day: 0.25    Years: 17.00    Total pack years: 4.25    Types: Cigarettes    Quit date: 09/11/2019    Years since quitting: 2.7   Smokeless tobacco: Never  Vaping Use   Vaping Use: Never used  Substance Use Topics   Alcohol use: Yes    Drug use: No     Allergies   Penicillins   Review of Systems Review of Systems  Gastrointestinal:  Positive for diarrhea.     Physical Exam Triage Vital Signs ED Triage Vitals  Enc Vitals Group     BP 06/16/22 1151 131/86     Pulse Rate 06/16/22 1151 73     Resp 06/16/22 1151 18     Temp 06/16/22 1151 98.2 F (36.8 C)     Temp Source 06/16/22 1151 Oral     SpO2 06/16/22 1151 98 %     Weight --      Height --      Head Circumference --      Peak Flow --      Pain Score 06/16/22 1152 0     Pain Loc --      Pain Edu? --      Excl. in [place]? --    No data found.  Updated Vital Signs BP 131/86 (BP Location: Left Arm)   Pulse 73   Temp 98.2 F (36.8 C) (Oral)   Resp 18   LMP  (LMP Unknown)   SpO2 98%   Visual Acuity Right Eye Distance:   Left Eye Distance:   Bilateral Distance:    Right Eye Near:   Left Eye Near:    Bilateral Near:     Physical Exam Vitals reviewed.  Constitutional:      Appearance: Normal appearance.  Abdominal:     General: Abdomen is flat. Bowel sounds are increased.     Palpations: Abdomen is soft.     Tenderness: There is generalized abdominal tenderness.  Skin:    General: Skin is warm and dry.  Neurological:     General: No focal deficit present.     Mental Status: She is alert and oriented to person, place, and time.  Psychiatric:        Mood and Affect: Mood normal.        Behavior: Behavior normal.      UC Treatments / Results  Labs (all labs ordered are listed, but only abnormal results are displayed) Labs Reviewed - No data to display  EKG   Radiology No results found.  Procedures Procedures (including critical care time)  Medications Ordered in UC Medications - No data to display  Initial Impression / Assessment and Plan / UC Course  I have reviewed the triage vital signs and the nursing  notes.  Pertinent labs & imaging results that were available during my care of the patient were reviewed by me and  considered in my medical decision making (see chart for details).   Patient is afebrile here without recent antipyretics. Satting well on room air. Overall is ill appearing, though well hydrated, without respiratory distress.  Abdomen is soft with generalized tenderness and increased bowel sounds.  There is no guarding.  Symptoms are consistent with an acute viral process.  While food poisoning is possible, it is unlikely given the timing of symptoms starting this morning versus last night.  In any case, no change in treatment would be advised.  Will treat nausea with Zofran in order to promote hydration and replacement of electrolytes.  Will advise against antidiarrheal medication unless symptoms continue and signs of dehydration such as dizziness.  May start Imodium if symptoms of diarrhea continue until tomorrow.  Final Clinical Impressions(s) / UC Diagnoses   Final diagnoses:  None   Discharge Instructions   None    ED Prescriptions   None    PDMP not reviewed this encounter.   Rose Phi, [place] 06/16/22 1209

## 2023-02-13 ENCOUNTER — Other Ambulatory Visit: Payer: Self-pay

## 2023-02-13 ENCOUNTER — Ambulatory Visit: Payer: Medicaid Other | Admitting: Obstetrics & Gynecology

## 2023-02-13 ENCOUNTER — Encounter: Payer: Self-pay | Admitting: Obstetrics & Gynecology

## 2023-02-13 ENCOUNTER — Other Ambulatory Visit (HOSPITAL_COMMUNITY)
Admission: RE | Admit: 2023-02-13 | Discharge: 2023-02-13 | Disposition: A | Discharge status: Home / Self Care | Payer: Medicaid Other | Source: Ambulatory Visit | Attending: Family Medicine | Admitting: Family Medicine

## 2023-02-13 VITALS — BP 135/87 | HR 73 | Ht 67.5 in | Wt 242.5 lb

## 2023-02-13 DIAGNOSIS — Z1211 Encounter for screening for malignant neoplasm of colon: Secondary | ICD-10-CM | POA: Diagnosis not present

## 2023-02-13 DIAGNOSIS — N939 Abnormal uterine and vaginal bleeding, unspecified: Secondary | ICD-10-CM | POA: Diagnosis present

## 2023-02-13 DIAGNOSIS — Z124 Encounter for screening for malignant neoplasm of cervix: Secondary | ICD-10-CM | POA: Insufficient documentation

## 2023-02-13 DIAGNOSIS — Z1231 Encounter for screening mammogram for malignant neoplasm of breast: Secondary | ICD-10-CM

## 2023-02-13 DIAGNOSIS — Z113 Encounter for screening for infections with a predominantly sexual mode of transmission: Secondary | ICD-10-CM | POA: Insufficient documentation

## 2023-02-13 NOTE — Progress Notes (Signed)
GYNECOLOGY OFFICE VISIT NOTE  History:   Robin Fox is a 47 y.o. [phone] here today for evaluation of irregular menstrual cycles.  Reports no periods from November 2023 to August 2024. Also increased 20 pound weight gain over the last few months, she is concerned about this.  No other symptoms.  She denies any abnormal vaginal discharge, bleeding, pelvic pain or other concerns.    Past Medical History:  Diagnosis Date   Anemia    hx    Essential hypertension 11/09/2011   Baseline labs, Testing 32/wks, serial growth ultrasound  Protein 96; cmp nml; plat 329  Growth 03/23/12 80%     Gestational diabetes    Neg 2 hr test after preg   Headache(784.0)    Herpes genitalia    2013 - 1st outbreak   Hx of herpes genitalis 11/21/2011   Acyclovir. Needs suppression at 35 weeks.     Hypertension    2008   Hypertension in pregnancy, essential, antepartum 11/09/2011   Pre-eclampsia in third trimester    Seasonal allergies    Shortness of breath    w exertion   Trichomonas    Twins     Past Surgical History:  Procedure Laterality Date   CESAREAN SECTION     X 2   CESAREAN SECTION  04/21/2012   Procedure: CESAREAN SECTION;  Surgeon: Adam Phenix, MD;  Location: [facility];  Service: Obstetrics;  Laterality: N/A;   TOOTH EXTRACTION      The following portions of the patient's history were reviewed and updated as appropriate: allergies, current medications, past family history, past medical history, past social history, past surgical history and problem list.   Health Maintenance:  Normal pap and negative HRHPV on 11/15/2022.  Normal mammogram on 03/30/2020.   Review of Systems:  Pertinent items noted in HPI and remainder of comprehensive ROS otherwise negative.  Physical Exam:  BP 135/87   Pulse 73   Ht 5' 7.5" (1.715 m)   Wt 242 lb 8 oz (110 kg)   BMI 37.42 kg/m  CONSTITUTIONAL: Well-developed, well-nourished female in no acute distress.  HENT:  Normocephalic,  atraumatic, External right and left ear normal.  EYES: Conjunctivae and EOM are normal. Pupils are equal, round, and reactive to light. No scleral icterus.  NECK: Normal range of motion, supple, no masses.  Normal thyroid.  SKIN: Skin is warm and dry. No rash noted. Not diaphoretic. No erythema. No pallor. MUSCULOSKELETAL: Normal range of motion. No tenderness.  No cyanosis, clubbing, or edema.   NEUROLOGIC: Alert and oriented to person, place, and time. Normal reflexes, muscle tone coordination. No cranial nerve deficit noted. PSYCHIATRIC: Normal mood and affect. Normal behavior. Normal judgment and thought content. CARDIOVASCULAR: Normal heart rate noted, regular rhythm RESPIRATORY: Clear to auscultation bilaterally. Effort and breath sounds normal, no problems with respiration noted. BREASTS: Symmetric in size. No masses, tenderness, skin changes, nipple drainage, or lymphadenopathy bilaterally.  Performed in the presence of a chaperone. ABDOMEN: Soft, obese, no distention appreciated.  No tenderness, rebound or guarding.  PELVIC: Normal appearing external genitalia and urethral meatus; normal appearing vaginal mucosa and cervix.  Normal appearing discharge.  Pap smear obtained.  Unable to palpate uterus or adnexa secondary to habitus.  Performed in the presence of a chaperone.    Assessment and Plan:    1. Breast cancer screening by mammogram Mammogram ordered - MM 3D SCREENING MAMMOGRAM BILATERAL BREAST; Future  2. Colon cancer screening Colonoscopy referral done - Ambulatory  referral to Gastroenterology  3. Screening examination for STD (sexually transmitted disease) Requested by patient, will follow up results and manage accordingly. - Cervicovaginal ancillary only - RPR+HBsAg+HCVAb+HIV  4. Cervical cancer screening Pap done, will follow up results and manage accordingly. - Cytology - PAP  5. Abnormal uterine bleeding (AUB) Likely perimenopausal, this was discussed with  patient.  Will check labs for other possible etiologies. Pelvic ultrasound also ordered, want adnexal evaluation especially given her unintentional weight loss. Bleeding precautions advised. - CBC - TSH Rfx on Abnormal to Free T4 - Prolactin - Beta hCG quant (ref lab) - US PELVIC COMPLETE WITH TRANSVAGINAL; Future - Cervicovaginal ancillary only - Cytology - PAP - [facility]   Return for follow up as recommended.    I spent  45  minutes dedicated to the care of this patient including pre-visit review of records, face to face time with the patient discussing her conditions and treatments and post visit orders.    Jaynie Collins, MD, FACOG Obstetrician & Gynecologist, [facility] for [employer], [facility]

## 2023-02-14 ENCOUNTER — Encounter (HOSPITAL_BASED_OUTPATIENT_CLINIC_OR_DEPARTMENT_OTHER): Payer: Self-pay | Admitting: Emergency Medicine

## 2023-02-14 ENCOUNTER — Other Ambulatory Visit: Payer: Self-pay

## 2023-02-14 ENCOUNTER — Emergency Department (HOSPITAL_BASED_OUTPATIENT_CLINIC_OR_DEPARTMENT_OTHER): Payer: Medicaid Other

## 2023-02-14 ENCOUNTER — Emergency Department (HOSPITAL_COMMUNITY)
Admission: EM | Admit: 2023-02-14 | Discharge: 2023-02-14 | Disposition: A | Discharge status: Home / Self Care | Payer: Medicaid Other | Attending: Emergency Medicine | Admitting: Emergency Medicine

## 2023-02-14 ENCOUNTER — Emergency Department (HOSPITAL_BASED_OUTPATIENT_CLINIC_OR_DEPARTMENT_OTHER)
Admission: EM | Admit: 2023-02-14 | Discharge: 2023-02-14 | Disposition: A | Discharge status: Home / Self Care | Payer: Medicaid Other | Attending: Emergency Medicine | Admitting: Emergency Medicine

## 2023-02-14 DIAGNOSIS — J069 Acute upper respiratory infection, unspecified: Secondary | ICD-10-CM | POA: Insufficient documentation

## 2023-02-14 DIAGNOSIS — R0602 Shortness of breath: Secondary | ICD-10-CM | POA: Diagnosis present

## 2023-02-14 DIAGNOSIS — Z79899 Other long term (current) drug therapy: Secondary | ICD-10-CM | POA: Insufficient documentation

## 2023-02-14 DIAGNOSIS — I10 Essential (primary) hypertension: Secondary | ICD-10-CM | POA: Insufficient documentation

## 2023-02-14 DIAGNOSIS — Z1152 Encounter for screening for COVID-19: Secondary | ICD-10-CM | POA: Insufficient documentation

## 2023-02-14 LAB — RPR+HBSAG+HCVAB+...
HIV Screen 4th Generation wRfx: NONREACTIVE
Hep C Virus Ab: NONREACTIVE
Hepatitis B Surface Ag: NEGATIVE
RPR Ser Ql: NONREACTIVE

## 2023-02-14 LAB — CBC
Hematocrit: 45.3 % (ref 34.0–46.6)
Hemoglobin: 14.3 g/dL (ref 11.1–15.9)
MCH: 22.5 pg — ABNORMAL LOW (ref 26.6–33.0)
MCHC: 31.6 g/dL (ref 31.5–35.7)
MCV: 71 fL — ABNORMAL LOW (ref 79–97)
Platelets: 346 10*3/uL (ref 150–450)
RBC: 6.36 x10E6/uL — ABNORMAL HIGH (ref 3.77–5.28)
RDW: 16 % — ABNORMAL HIGH (ref 11.7–15.4)
WBC: 7.1 10*3/uL (ref 3.4–10.8)

## 2023-02-14 LAB — TSH RFX ON ABNORMAL TO FREE T4: TSH: 0.829 u[IU]/mL (ref 0.450–4.500)

## 2023-02-14 LAB — SARS CORONAVIRUS 2 BY RT PCR: SARS Coronavirus 2 by RT PCR: NEGATIVE

## 2023-02-14 LAB — CERVICOVAGINAL ANCILLARY ONLY
Bacterial Vaginitis (gardnerella): NEGATIVE
Candida Glabrata: NEGATIVE
Candida Vaginitis: NEGATIVE
Chlamydia: NEGATIVE
Comment: NEGATIVE
Comment: NEGATIVE
Comment: NEGATIVE
Comment: NEGATIVE
Comment: NEGATIVE
Comment: NORMAL
Neisseria Gonorrhea: NEGATIVE
Trichomonas: NEGATIVE

## 2023-02-14 LAB — PROLACTIN: Prolactin: 5.1 ng/mL (ref 4.8–33.4)

## 2023-02-14 LAB — FOLLICLE STIMULATING HORMONE: FSH: 91.6 m[IU]/mL

## 2023-02-14 LAB — BETA HCG QUANT (REF LAB): hCG Quant: 2 m[IU]/mL

## 2023-02-14 MED ORDER — GUAIFENESIN-DM 100-10 MG/5ML PO SYRP: 5.0000 mL | ORAL_SOLUTION | Freq: Three times a day (TID) | ORAL | 0 refills | Status: DC | PRN

## 2023-02-14 MED ORDER — ALBUTEROL SULFATE HFA 108 (90 BASE) MCG/ACT IN AERS: 2.0000 | INHALATION_SPRAY | RESPIRATORY_TRACT | Status: DC | PRN

## 2023-02-14 NOTE — ED Notes (Signed)
D/c paperwork reviewed with pt, including prescriptions and follow up care.  No questions or concerns voiced at time of d/c. Robin Fox Pt verbalized understanding, Ambulatory without assistance to ED exit, NAD.

## 2023-02-14 NOTE — ED Triage Notes (Signed)
Pt reports shortness of breath, cough, and wheezing for 1 week. Patient reports using OTC medications and son's home nebulizer treatments without relief.

## 2023-02-14 NOTE — ED Provider Notes (Signed)
[facility] Provider Note   CSN: [id number] Arrival date & time: 02/14/23  1919     History  Chief Complaint  Patient presents with   Shortness of Breath    Robin Fox is a 47 y.o. female.   Shortness of Breath Patient presents with shortness of breath for a week.  Has had a cough.  Son has had similar symptoms.  Used son's nebulizer without much relief.  Has had some mild green sputum production.  No fevers.  No history of underlying lung disease.  Patient does smoke.    Past Medical History:  Diagnosis Date   Anemia    hx    Essential hypertension 11/09/2011   Baseline labs, Testing 32/wks, serial growth ultrasound  Protein 96; cmp nml; plat 329  Growth 03/23/12 80%     Gestational diabetes    Neg 2 hr test after preg   Headache(784.0)    Herpes genitalia    2013 - 1st outbreak   Hx of herpes genitalis 11/21/2011   Acyclovir. Needs suppression at 35 weeks.     Hypertension    2008   Hypertension in pregnancy, essential, antepartum 11/09/2011   Pre-eclampsia in third trimester    Seasonal allergies    Shortness of breath    w exertion   Trichomonas    Twins     Home Medications Prior to Admission medications   Medication Sig Start Date End Date Taking? Authorizing Provider  guaiFENesin-dextromethorphan (ROBITUSSIN DM) 100-10 MG/5ML syrup Take 5 mLs by mouth 3 (three) times daily as needed for cough. 02/14/23  Yes Benjiman Core, MD  albuterol (PROVENTIL) (2.5 MG/3ML) 0.083% nebulizer solution USE 1 VIAL IN NEBULIZER EVERY 6 HOURS AS NEEDED FOR WHEEZING FOR SHORTNESS OF BREATH Patient taking differently: Take 2.5 mg by nebulization every 6 (six) hours as needed for shortness of breath. 02/28/21   Janeece Agee, NP  albuterol (VENTOLIN HFA) 108 (90 Base) MCG/ACT inhaler Inhale 1-2 puffs into the lungs every 6 (six) hours as needed for wheezing or shortness of breath. 05/05/20   Wieters, Hallie C, PA-C   hydrochlorothiazide (HYDRODIURIL) 25 MG tablet Take 1 tablet (25 mg total) by mouth daily. 10/02/21   Rhys Martini, PA-C  metroNIDAZOLE (FLAGYL) 500 MG tablet Take 1 tablet (500 mg total) by mouth 2 (two) times daily. 06/15/21   Lamptey, Britta Mccreedy, MD  ondansetron (ZOFRAN-ODT) 4 MG disintegrating tablet Take 1 tablet (4 mg total) by mouth every 8 (eight) hours as needed for nausea or vomiting. 06/16/22   Immordino, Jeannett Senior, FNP  valACYclovir (VALTREX) 500 MG tablet TAKE 1 TABLET BY MOUTH ONCE DAILY INCREASE  TO  TWICE  DAILY  FOR  3  DAYS  DURING  OUTBREAK 06/26/20   Shade Flood, MD      Allergies    Penicillins    Review of Systems   Review of Systems  Respiratory:  Positive for shortness of breath.     Physical Exam Updated Vital Signs BP (!) 140/101 (BP Location: Left Arm)   Pulse 93   Temp 98.5 F (36.9 C) (Oral)   Resp (!) 28   Ht 5' 7.5" (1.715 m)   Wt 108.4 kg   SpO2 96%   BMI 36.88 kg/m  Physical Exam Vitals reviewed.  Pulmonary:     Breath sounds: No wheezing, rhonchi or rales.  Neurological:     Mental Status: She is alert.     ED Results /  Procedures / Treatments   Labs (all labs ordered are listed, but only abnormal results are displayed) Labs Reviewed  SARS CORONAVIRUS 2 BY RT PCR    EKG None  Radiology DG Chest 2 View  Result Date: 02/14/2023 CLINICAL DATA:  shortness of breath EXAM: CHEST - 2 VIEW COMPARISON:  06/18/2021. FINDINGS: Cardiac silhouette is unremarkable. No pneumothorax or pleural effusion. The lungs are clear. The visualized skeletal structures are unremarkable. IMPRESSION: No acute cardiopulmonary process. Electronically Signed   By: Layla Maw M.D.   On: 02/14/2023 21:01    Procedures Procedures    Medications Ordered in ED Medications  albuterol (VENTOLIN HFA) 108 (90 Base) MCG/ACT inhaler 2 puff (has no administration in time range)    ED Course/ Medical Decision Making/ A&P                                  Medical Decision Making Amount and/or Complexity of Data Reviewed Radiology: ordered.  Risk OTC drugs. Prescription drug management.   Patient with URI symptoms cough.  Lungs clear.  Negative COVID test.  X-ray does not show pneumonia.  I think most likely uri.  Well-appearing.  Discharged home with symptomatic treatment..        Final Clinical Impression(s) / ED Diagnoses Final diagnoses:  Upper respiratory tract infection, unspecified type    Rx / DC Orders ED Discharge Orders          Ordered    guaiFENesin-dextromethorphan (ROBITUSSIN DM) 100-10 MG/5ML syrup  3 times daily PRN        02/14/23 2134              Benjiman Core, MD 02/14/23 2135

## 2023-02-14 NOTE — ED Notes (Signed)
Pt left. No longer wanted to wait  ?

## 2023-02-17 ENCOUNTER — Ambulatory Visit (HOSPITAL_BASED_OUTPATIENT_CLINIC_OR_DEPARTMENT_OTHER): Admission: RE | Admit: 2023-02-17 | Payer: Medicaid Other | Source: Ambulatory Visit

## 2023-02-21 LAB — CYTOLOGY - PAP
Comment: NEGATIVE
Diagnosis: NEGATIVE
High risk HPV: NEGATIVE

## 2023-02-28 ENCOUNTER — Other Ambulatory Visit: Payer: Self-pay

## 2023-02-28 ENCOUNTER — Emergency Department (HOSPITAL_BASED_OUTPATIENT_CLINIC_OR_DEPARTMENT_OTHER)
Admission: EM | Admit: 2023-02-28 | Discharge: 2023-02-28 | Disposition: A | Discharge status: Home / Self Care | Payer: Medicaid Other | Attending: Emergency Medicine | Admitting: Emergency Medicine

## 2023-02-28 ENCOUNTER — Encounter (HOSPITAL_BASED_OUTPATIENT_CLINIC_OR_DEPARTMENT_OTHER): Payer: Self-pay

## 2023-02-28 ENCOUNTER — Emergency Department (HOSPITAL_BASED_OUTPATIENT_CLINIC_OR_DEPARTMENT_OTHER): Payer: Medicaid Other

## 2023-02-28 DIAGNOSIS — J40 Bronchitis, not specified as acute or chronic: Secondary | ICD-10-CM | POA: Insufficient documentation

## 2023-02-28 DIAGNOSIS — I10 Essential (primary) hypertension: Secondary | ICD-10-CM | POA: Insufficient documentation

## 2023-02-28 DIAGNOSIS — F17200 Nicotine dependence, unspecified, uncomplicated: Secondary | ICD-10-CM | POA: Insufficient documentation

## 2023-02-28 DIAGNOSIS — Z79899 Other long term (current) drug therapy: Secondary | ICD-10-CM | POA: Insufficient documentation

## 2023-02-28 DIAGNOSIS — Z720 Tobacco use: Secondary | ICD-10-CM

## 2023-02-28 DIAGNOSIS — R0602 Shortness of breath: Secondary | ICD-10-CM | POA: Diagnosis present

## 2023-02-28 MED ORDER — IPRATROPIUM-ALBUTEROL 0.5-2.5 (3) MG/3ML IN SOLN: 3.0000 mL | Freq: Once | RESPIRATORY_TRACT | Status: AC

## 2023-02-28 MED ORDER — IPRATROPIUM-ALBUTEROL 0.5-2.5 (3) MG/3ML IN SOLN
3.0000 mL | Freq: Once | RESPIRATORY_TRACT | Status: AC
  Administered 2023-02-28: 3 mL via RESPIRATORY_TRACT
  Filled 2023-02-28: qty 3

## 2023-02-28 MED ORDER — ALBUTEROL SULFATE HFA 108 (90 BASE) MCG/ACT IN AERS: 2.0000 | INHALATION_SPRAY | RESPIRATORY_TRACT | Status: DC | PRN

## 2023-02-28 MED ORDER — IPRATROPIUM-ALBUTEROL 0.5-2.5 (3) MG/3ML IN SOLN
RESPIRATORY_TRACT | Status: AC
  Administered 2023-02-28: 3 mL via RESPIRATORY_TRACT
  Filled 2023-02-28: qty 3

## 2023-02-28 MED ORDER — PREDNISONE 20 MG PO TABS: 40.0000 mg | ORAL_TABLET | Freq: Every day | ORAL | 0 refills | Status: AC

## 2023-02-28 NOTE — ED Triage Notes (Signed)
Pt c/o shortness of breath, wheezing and cough for a week. Pt reports she was here for same last week. Pt reports her nebs are not helping.

## 2023-02-28 NOTE — ED Notes (Signed)

## 2023-02-28 NOTE — ED Provider Notes (Signed)
EMERGENCY DEPARTMENT AT [facility] Provider Note   CSN: [id number] Arrival date & time: 02/28/23  1724     History  Chief Complaint  Patient presents with   Shortness of Breath    Robin Fox is a 47 y.o. female.  47 y.o. female presenting with cough, SOB, and wheezing x2 weeks. She originally presented to the ED on 9/20 with URI symptoms which were presumed to be viral.  She was given supportive care instructions and told to follow-up outpatient.  She has been using her son's albuterol inhaler every 4-6 hours without if cannot relief and has had audible wheezing.  She reports she is extremely short of breath and struggles to walk up her stairs at home.  She has a 28-year smoking history but has not been smoking since she has been acutely ill.  She is unsure if she has been febrile at home due to being menopausal and having hot flashes.  She denies other people at home being sick.  She reports she just recently had diarrhea start the last 2 to 3 days.  She has had decreased appetite but has been drinking and urinating appropriately.  The history is provided by the patient. No language interpreter was used.  Shortness of Breath Associated symptoms: cough and wheezing   Associated symptoms: no chest pain, no fever, no headaches, no sore throat and no vomiting        Home Medications Prior to Admission medications   Medication Sig Start Date End Date Taking? Authorizing Provider  predniSONE (DELTASONE) 20 MG tablet Take 2 tablets (40 mg total) by mouth daily with breakfast for 5 days. 02/28/23 03/05/23 Yes Glendale Chard, DO  albuterol (PROVENTIL) (2.5 MG/3ML) 0.083% nebulizer solution USE 1 VIAL IN NEBULIZER EVERY 6 HOURS AS NEEDED FOR WHEEZING FOR SHORTNESS OF BREATH Patient taking differently: Take 2.5 mg by nebulization every 6 (six) hours as needed for shortness of breath. 02/28/21   Janeece Agee, NP  albuterol (VENTOLIN HFA) 108 (90 Base) MCG/ACT  inhaler Inhale 1-2 puffs into the lungs every 6 (six) hours as needed for wheezing or shortness of breath. 05/05/20   Wieters, Hallie C, PA-C  guaiFENesin-dextromethorphan (ROBITUSSIN DM) 100-10 MG/5ML syrup Take 5 mLs by mouth 3 (three) times daily as needed for cough. 02/14/23   Benjiman Core, MD  hydrochlorothiazide (HYDRODIURIL) 25 MG tablet Take 1 tablet (25 mg total) by mouth daily. 10/02/21   Rhys Martini, PA-C  metroNIDAZOLE (FLAGYL) 500 MG tablet Take 1 tablet (500 mg total) by mouth 2 (two) times daily. 06/15/21   Lamptey, Britta Mccreedy, MD  ondansetron (ZOFRAN-ODT) 4 MG disintegrating tablet Take 1 tablet (4 mg total) by mouth every 8 (eight) hours as needed for nausea or vomiting. 06/16/22   Immordino, Jeannett Senior, FNP  valACYclovir (VALTREX) 500 MG tablet TAKE 1 TABLET BY MOUTH ONCE DAILY INCREASE  TO  TWICE  DAILY  FOR  3  DAYS  DURING  OUTBREAK 06/26/20   Shade Flood, MD      Allergies    Penicillins    Review of Systems   Review of Systems  Constitutional:  Positive for activity change, chills and fatigue. Negative for fever.  HENT:  Positive for congestion and rhinorrhea. Negative for sinus pressure, sore throat and tinnitus.   Respiratory:  Positive for cough, chest tightness, shortness of breath and wheezing.   Cardiovascular:  Negative for chest pain and palpitations.  Gastrointestinal:  Positive for diarrhea. Negative for abdominal distention,  nausea and vomiting.  Neurological:  Negative for dizziness and headaches.    Physical Exam Updated Vital Signs BP (!) 149/88 (BP Location: Left Arm)   Pulse 83   Temp 98.4 F (36.9 C)   Resp (!) 24   Ht 5\' 7"  (1.702 m)   Wt 108 kg   SpO2 98%   BMI 37.28 kg/m  Physical Exam Constitutional:      General: She is not in acute distress.    Appearance: She is well-developed. She is obese. She is not toxic-appearing.  HENT:     Head: Normocephalic.     Mouth/Throat:     Mouth: Mucous membranes are moist.  Eyes:      Extraocular Movements: Extraocular movements intact.     Pupils: Pupils are equal, round, and reactive to light.  Cardiovascular:     Rate and Rhythm: Normal rate and regular rhythm.  Pulmonary:     Effort: Pulmonary effort is normal.     Breath sounds: Examination of the left-lower field reveals decreased breath sounds. Decreased breath sounds and wheezing present.  Chest:     Chest wall: No tenderness or crepitus.  Abdominal:     General: Bowel sounds are normal.     Palpations: Abdomen is soft.  Musculoskeletal:        General: Normal range of motion.     Cervical back: Normal range of motion and neck supple.  Skin:    Capillary Refill: Capillary refill takes less than 2 seconds.  Neurological:     General: No focal deficit present.     Mental Status: She is alert and oriented to person, place, and time.  Psychiatric:        Mood and Affect: Mood normal.        Behavior: Behavior normal.     ED Results / Procedures / Treatments   Labs (all labs ordered are listed, but only abnormal results are displayed) Labs Reviewed - No data to display  EKG None  Radiology DG Chest 2 View  Result Date: 02/28/2023 CLINICAL DATA:  Shortness of breath EXAM: CHEST - 2 VIEW COMPARISON:  02/14/2023 FINDINGS: The heart size and mediastinal contours are within normal limits. Both lungs are clear. The visualized skeletal structures are unremarkable. IMPRESSION: No active cardiopulmonary disease. Electronically Signed   By: Jasmine Pang M.D.   On: 02/28/2023 19:32    Procedures Procedures    Medications Ordered in ED Medications  albuterol (VENTOLIN HFA) 108 (90 Base) MCG/ACT inhaler 2 puff (has no administration in time range)  ipratropium-albuterol (DUONEB) 0.5-2.5 (3) MG/3ML nebulizer solution 3 mL (3 mLs Nebulization Given 02/28/23 1734)  ipratropium-albuterol (DUONEB) 0.5-2.5 (3) MG/3ML nebulizer solution 3 mL (3 mLs Nebulization Given 02/28/23 1941)    ED Course/ Medical Decision  Making/ A&P                                 Medical Decision Making 47 y.o. female presenting with 2 weeks of cough and shortness of breath.  On presentation vital signs significant for hypertension to 149/88.  On exam she is in no acute distress but has prolonged respiratory wheezing all 4 quadrants diminished on the left.  Differential for presentation includes pneumonia, COPD/asthma exacerbation, chronic bronchitis, PE.  Low suspicion for PE with normal EKG, heart rate, no provoking factors and history.  Chest x-ray resulting negative for pneumonia or active cardiopulmonary disease.  Patient received DuoNebs x  2, which seemed to improve her work of breathing and aeration.  With patient's 28-year smoking history most likely cough is secondary to acute bronchitis.  Will elect to treat with 5-day course of 40 mg prednisone. Patient to follow up with PCP outpatient for further workup for presumed underlying lung disease.   Amount and/or Complexity of Data Reviewed External Data Reviewed: labs and radiology.    Details: Reviewed and compared prior chest x-ray  Radiology: ordered. ECG/medicine tests: ordered. Decision-making details documented in ED Course.  Risk Prescription drug management.          Final Clinical Impression(s) / ED Diagnoses Final diagnoses:  Bronchitis  Tobacco abuse    Rx / DC Orders ED Discharge Orders          Ordered    predniSONE (DELTASONE) 20 MG tablet  Daily with breakfast        02/28/23 2010              Glendale Chard, DO 02/28/23 2010    Tegeler, Canary Brim, MD 03/01/23 [phone]

## 2023-03-13 ENCOUNTER — Ambulatory Visit
Admission: RE | Admit: 2023-03-13 | Discharge: 2023-03-13 | Disposition: A | Discharge status: Home / Self Care | Payer: Medicaid Other | Source: Ambulatory Visit | Attending: Obstetrics & Gynecology | Admitting: Obstetrics & Gynecology

## 2023-03-13 DIAGNOSIS — Z1231 Encounter for screening mammogram for malignant neoplasm of breast: Secondary | ICD-10-CM

## 2023-05-08 ENCOUNTER — Other Ambulatory Visit: Payer: Self-pay

## 2023-05-08 ENCOUNTER — Emergency Department: Payer: Medicaid Other

## 2023-05-08 ENCOUNTER — Emergency Department
Admission: EM | Admit: 2023-05-08 | Discharge: 2023-05-08 | Disposition: A | Discharge status: Home / Self Care | Payer: Medicaid Other | Attending: Emergency Medicine | Admitting: Emergency Medicine

## 2023-05-08 ENCOUNTER — Encounter: Payer: Self-pay | Admitting: Emergency Medicine

## 2023-05-08 DIAGNOSIS — M170 Bilateral primary osteoarthritis of knee: Secondary | ICD-10-CM | POA: Insufficient documentation

## 2023-05-08 DIAGNOSIS — M25561 Pain in right knee: Secondary | ICD-10-CM | POA: Diagnosis present

## 2023-05-08 DIAGNOSIS — J45909 Unspecified asthma, uncomplicated: Secondary | ICD-10-CM | POA: Diagnosis not present

## 2023-05-08 DIAGNOSIS — F17200 Nicotine dependence, unspecified, uncomplicated: Secondary | ICD-10-CM | POA: Diagnosis not present

## 2023-05-08 DIAGNOSIS — I10 Essential (primary) hypertension: Secondary | ICD-10-CM | POA: Insufficient documentation

## 2023-05-08 MED ORDER — NAPROXEN 500 MG PO TABS
500.0000 mg | ORAL_TABLET | Freq: Two times a day (BID) | ORAL | 0 refills | Status: AC
Start: 2023-05-08 — End: 2023-05-15

## 2023-05-08 NOTE — ED Provider Notes (Signed)
[facility] Provider Note    Event Date/Time   First MD Initiated Contact with Patient 05/08/23 [phone]     (approximate)   History   Knee Pain   HPI  Robin Fox is a 47 y.o. female with a past medical history of obesity, hypertension who presents today for evaluation of bilateral knee pain.  Patient reports that she has had left knee pain "for a very long time" and right knee pain that has been worsening over the past couple of days.  She denies any injury.  She has been able to walk, though has more pain with going up and down stairs.  No falls or trauma.  She has not noticed any skin changes.  No fevers or chills.  Patient Active Problem List   Diagnosis Date Noted   Reactive airway disease 08/14/2020   Pancreatitis 08/13/2020   Breast pain 03/30/2019   Tobacco use 08/01/2016   Obesity 11/21/2011   Hx of herpes genitalis 11/21/2011   Essential hypertension 11/09/2011          Physical Exam   Triage Vital Signs: ED Triage Vitals  Encounter Vitals Group     BP 05/08/23 0813 (!) 138/100     Systolic BP Percentile --      Diastolic BP Percentile --      Pulse Rate 05/08/23 0813 84     Resp 05/08/23 0813 18     Temp 05/08/23 0811 (!) 97.5 F (36.4 C)     Temp Source 05/08/23 0811 Oral     SpO2 05/08/23 0813 100 %     Weight 05/08/23 0813 241 lb (109.3 kg)     Height --      Head Circumference --      Peak Flow --      Pain Score 05/08/23 0813 7     Pain Loc --      Pain Education --      Exclude from Growth Chart --     Most recent vital signs: Vitals:   05/08/23 0811 05/08/23 0813  BP:  (!) 138/100  Pulse:  84  Resp:  18  Temp: (!) 97.5 F (36.4 C)   SpO2:  100%    Physical Exam Vitals and nursing note reviewed.  Constitutional:      General: Awake and alert. No acute distress.    Appearance: Normal appearance. The patient is normal weight.  HENT:     Head: Normocephalic and atraumatic.     Mouth: Mucous  membranes are moist.  Eyes:     General: PERRL. Normal EOMs        Right eye: No discharge.        Left eye: No discharge.     Conjunctiva/sclera: Conjunctivae normal.  Cardiovascular:     Rate and Rhythm: Normal rate and regular rhythm.     Pulses: Normal pulses.  Pulmonary:     Effort: Pulmonary effort is normal. No respiratory distress.     Breath sounds: Normal breath sounds.  Abdominal:     Abdomen is soft. There is no abdominal tenderness. No rebound or guarding. No distention. Musculoskeletal:        General: No swelling. Normal range of motion.     Cervical back: Normal range of motion and neck supple.  Left knee: No deformity or rash. No joint line tenderness. No patellar tenderness, no ballotment Warm and well perfused extremity with 2+ pedal pulses 5/5 strength to dorsiflexion and plantarflexion  at the ankle with intact sensation throughout extremity Normal range of motion of the knee, with intact flexion and extension to active and passive range of motion. Extensor mechanism intact. No ligamentous laxity. Negative anterior/posterior drawer/negative lachman, negative mcmurrays No effusion or warmth Intact quadriceps, hamstring function, patellar tendon function Pelvis stable Full ROM of ankle without pain or swelling Foot warm and well perfused Right knee: No obvious deformity, swelling, ecchymosis, or erythema No clavicular or AC joint tenderness Able to actively and passively forward flex and abduct at shoulder fully, negative drop arm test Negative Obriens, SLAP, empty can, and lift off tests Normal internal and external rotation against resistance Negative Hawkins and Neers Normal ROM at elbow and wrist Normal resisted pronation and supination 2+ radial pulse Normal grip strength Normal intrinsic hand muscle function Skin:    General: Skin is warm and dry.     Capillary Refill: Capillary refill takes less than 2 seconds.     Findings: No rash.  Neurological:      Mental Status: The patient is awake and alert.      ED Results / Procedures / Treatments   Labs (all labs ordered are listed, but only abnormal results are displayed) Labs Reviewed - No data to display   EKG     RADIOLOGY I independently reviewed and interpreted imaging and agree with radiologists findings.     PROCEDURES:  Critical Care performed:   Procedures   MEDICATIONS ORDERED IN ED: Medications - No data to display   IMPRESSION / MDM / ASSESSMENT AND PLAN / ED COURSE  I reviewed the triage vital signs and the nursing notes.   Differential diagnosis includes, but is not limited to, osteoarthritis, effusion, meniscus injury, strain.  Patient presents to the emergency department with knee pain. She is awake and alert, hemodynamically stable and afebrile. Ambulatory with a steady gait. No evidence of neurological deficit or vascular compromise on exam. No trauma or fracture/dislocation on X-Ray. No deformity or obvious ligamentous laxity on exam.No constitutional symptoms or effusion to suggest septic joint. No history of immunosuppression. Overall well appearing, vital signs stable. No indication for diagnostic or therapeutic procedure such as arthrocentesis. XR reveals bilateral tricompartmental osteoarthritis, likely the source of her pain. She was instructed to follow up with orthopedics, appropriate information provided. Return precautions and care instructions discussed. Given naproxen per request, normal creatinine per chart review. Advised to not take with other NSAIDs. Patient agrees with plan of care.   Patient's presentation is most consistent with acute complicated illness / injury requiring diagnostic workup.    FINAL CLINICAL IMPRESSION(S) / ED DIAGNOSES   Final diagnoses:  Primary osteoarthritis of both knees     Rx / DC Orders   ED Discharge Orders          Ordered    naproxen (NAPROSYN) 500 MG tablet  2 times daily with meals         05/08/23 0855             Note:  This document was prepared using Dragon voice recognition software and may include unintentional dictation errors.   Jackelyn Hoehn, PA-C 05/08/23 1341    Janith Lima, MD 05/09/23 [phone]

## 2023-05-08 NOTE — ED Triage Notes (Signed)
L knee pain and swelling; R knee pain starting 2 days ago. No recent fall or injury. Worse going down stairs.  Tried motrin, ice, rest.

## 2023-05-08 NOTE — Discharge Instructions (Signed)
Your x-rays show tricompartmental osteoarthritis of both of your knees.  Please follow-up with orthopedics.  You may take the medication as prescribed in addition to Tylenol, though do not take with other NSAIDs.  Please return for any new, worsening, or change in symptoms or other concerns.  It was a pleasure caring for you today.

## 2023-05-08 NOTE — ED Notes (Signed)
See triage note Presents with bilateral knee pain w/o injury  States she noticed left knee swelling couple of days ago  Then also noticed pain to right knee

## 2023-07-18 DIAGNOSIS — H01021 Squamous blepharitis right upper eyelid: Secondary | ICD-10-CM | POA: Diagnosis not present

## 2023-07-18 DIAGNOSIS — H43823 Vitreomacular adhesion, bilateral: Secondary | ICD-10-CM | POA: Diagnosis not present

## 2023-07-18 DIAGNOSIS — H35051 Retinal neovascularization, unspecified, right eye: Secondary | ICD-10-CM | POA: Diagnosis not present

## 2023-07-18 DIAGNOSIS — H2513 Age-related nuclear cataract, bilateral: Secondary | ICD-10-CM | POA: Diagnosis not present

## 2023-07-18 DIAGNOSIS — H04123 Dry eye syndrome of bilateral lacrimal glands: Secondary | ICD-10-CM | POA: Diagnosis not present

## 2023-07-22 ENCOUNTER — Encounter: Payer: Self-pay | Admitting: Family Medicine

## 2023-07-22 ENCOUNTER — Ambulatory Visit: Payer: Medicaid Other | Admitting: Family Medicine

## 2023-07-22 VITALS — BP 122/83 | HR 75 | Ht 67.0 in | Wt 249.1 lb

## 2023-07-22 DIAGNOSIS — A6000 Herpesviral infection of urogenital system, unspecified: Secondary | ICD-10-CM | POA: Diagnosis not present

## 2023-07-22 DIAGNOSIS — Z8619 Personal history of other infectious and parasitic diseases: Secondary | ICD-10-CM

## 2023-07-22 DIAGNOSIS — Z7689 Persons encountering health services in other specified circumstances: Secondary | ICD-10-CM

## 2023-07-22 DIAGNOSIS — I10 Essential (primary) hypertension: Secondary | ICD-10-CM | POA: Diagnosis not present

## 2023-07-22 DIAGNOSIS — E66813 Obesity, class 3: Secondary | ICD-10-CM

## 2023-07-22 DIAGNOSIS — Z1212 Encounter for screening for malignant neoplasm of rectum: Secondary | ICD-10-CM

## 2023-07-22 DIAGNOSIS — Z1211 Encounter for screening for malignant neoplasm of colon: Secondary | ICD-10-CM

## 2023-07-22 DIAGNOSIS — Z720 Tobacco use: Secondary | ICD-10-CM

## 2023-07-22 DIAGNOSIS — R718 Other abnormality of red blood cells: Secondary | ICD-10-CM

## 2023-07-22 DIAGNOSIS — K859 Acute pancreatitis without necrosis or infection, unspecified: Secondary | ICD-10-CM

## 2023-07-22 MED ORDER — VALACYCLOVIR HCL 500 MG PO TABS: ORAL_TABLET | ORAL | 3 refills | Status: DC

## 2023-07-22 MED ORDER — HYDROCHLOROTHIAZIDE 25 MG PO TABS: 25.0000 mg | ORAL_TABLET | Freq: Every day | ORAL | 3 refills | Status: AC

## 2023-07-22 MED ORDER — PHENTERMINE HCL 15 MG PO CAPS: 15.0000 mg | ORAL_CAPSULE | ORAL | 1 refills | Status: DC

## 2023-07-22 NOTE — Patient Instructions (Addendum)
 It was nice to see you today,  We addressed the following topics today: -I am prescribing phentermine for you.  You will take this once a day in the morning.  There is some information of this paperwork regarding this medication and common side effects - We will see back in 1 month to see if we need to change the dose. - I am also sending a referral to the nutritionist.  Someone will call you to schedule this appointment. - I am ordering some lab tests.  I will follow-up the results of this with you after I get them. - I am also sending in a referral to the gastroenterologist.  Someone should call you to schedule his colonoscopy.  Have a great day,  Frederic Jericho, MD

## 2023-07-22 NOTE — Assessment & Plan Note (Signed)
 No repeat episodes of pancreatitis since her initial hospitalization.

## 2023-07-22 NOTE — Assessment & Plan Note (Signed)
 Continue HCTZ.  At goal today.

## 2023-07-22 NOTE — Assessment & Plan Note (Signed)
 Continue Valtrex daily suppressive therapy

## 2023-07-22 NOTE — Progress Notes (Signed)
 New Patient Office Visit  Subjective   Patient ID: Robin Fox, female    DOB: 1975-06-26  Age: 48 y.o. MRN: [id number]  CC:  Chief Complaint  Patient presents with   New Patient (Initial Visit)    HPI Robin Fox presents to establish care Most recently going to try a primary care and then previously [place].  Patient being treated for hypertension, HSV, obesity.  Patient would like to discuss weight loss.  States she is the heaviest she has ever been.  We talked about medication options including GLP-1, bupropion, phentermine.  Patient also interested in referral to nutrition counseling.  Patient not currently on any sort of diet plan.  Exercise was minimal this past winter.  PMH: Hypertension, HSV, obesity, pancreatitis  PSH: 3x c-section.    FH: maternal aunt - colon cancer 36 yo.    Younger brother - stomach cancer.  Dm - mother, brothers, htn - mother.    Tobacco use: former smoker - a few months ago.  Age 11 -3 1/2 ppd.   Alcohol use: occasional wine.   Drug use: no Marital status: married.  4 sons.  21, 16, 16, 11.   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  No on birth control.    Screenings:  Colon Cancer: Never done Lung Cancer: N/A Breast Cancer: UTD   Outpatient Encounter Medications as of 07/22/2023  Medication Sig   ondansetron (ZOFRAN-ODT) 4 MG disintegrating tablet Take 1 tablet (4 mg total) by mouth every 8 (eight) hours as needed for nausea or vomiting.   phentermine 15 MG capsule Take 1 capsule (15 mg total) by mouth every morning.   [DISCONTINUED] hydrochlorothiazide (HYDRODIURIL) 25 MG tablet Take 1 tablet (25 mg total) by mouth daily.   [DISCONTINUED] valACYclovir (VALTREX) 500 MG tablet TAKE 1 TABLET BY MOUTH ONCE DAILY INCREASE  TO  TWICE  DAILY  FOR  3  DAYS  DURING  OUTBREAK   hydrochlorothiazide (HYDRODIURIL) 25 MG tablet Take 1 tablet (25 mg total) by mouth  daily.   valACYclovir (VALTREX) 500 MG tablet TAKE 1 TABLET BY MOUTH ONCE DAILY INCREASE  TO  TWICE  DAILY  FOR  3  DAYS  DURING  OUTBREAK   [DISCONTINUED] albuterol (PROVENTIL) (2.5 MG/3ML) 0.083% nebulizer solution USE 1 VIAL IN NEBULIZER EVERY 6 HOURS AS NEEDED FOR WHEEZING FOR SHORTNESS OF BREATH (Patient taking differently: Take 2.5 mg by nebulization every 6 (six) hours as needed for shortness of breath.)   [DISCONTINUED] albuterol (VENTOLIN HFA) 108 (90 Base) MCG/ACT inhaler Inhale 1-2 puffs into the lungs every 6 (six) hours as needed for wheezing or shortness of breath.   [DISCONTINUED] guaiFENesin-dextromethorphan (ROBITUSSIN DM) 100-10 MG/5ML syrup Take 5 mLs by mouth 3 (three) times daily as needed for cough.   [DISCONTINUED] metroNIDAZOLE (FLAGYL) 500 MG tablet Take 1 tablet (500 mg total) by mouth 2 (two) times daily.   No facility-administered encounter medications on file as of 07/22/2023.    Past Medical History:  Diagnosis Date   Anemia    hx    Essential hypertension 11/09/2011   Baseline labs, Testing 32/wks, serial growth ultrasound  Protein 96; cmp nml; plat 329  Growth 03/23/12 80%     Gestational diabetes    Neg 2 hr test after preg   Headache(784.0)    Herpes genitalia    2013 - 1st outbreak   Hx of herpes genitalis 11/21/2011  Acyclovir. Needs suppression at 35 weeks.     Hypertension    2008   Hypertension in pregnancy, essential, antepartum 11/09/2011   Pre-eclampsia in third trimester    Seasonal allergies    Shortness of breath    w exertion   Trichomonas    Twins     Past Surgical History:  Procedure Laterality Date   CESAREAN SECTION     X 2   CESAREAN SECTION  04/21/2012   Procedure: CESAREAN SECTION;  Surgeon: Adam Phenix, MD;  Location: [facility];  Service: Obstetrics;  Laterality: N/A;   TOOTH EXTRACTION      Family History  Problem Relation Age of Onset   Hypertension Mother    Stroke Mother    Stroke Maternal Grandmother    Heart  disease Maternal Grandfather 38   Hypertension Brother    Diabetes Brother    Stomach cancer Brother    Hypertension Brother    Diabetes Brother     Social History   Socioeconomic History   Marital status: Married    Spouse name: Not on file   Number of children: Not on file   Years of education: Not on file   Highest education level: Not on file  Occupational History   Not on file  Tobacco Use   Smoking status: Former    Current packs/day: 0.00    Average packs/day: 0.3 packs/day for 17.0 years (4.3 ttl pk-yrs)    Types: Cigarettes    Start date: 09/11/2002    Quit date: 09/11/2019    Years since quitting: 3.8   Smokeless tobacco: Never  Vaping Use   Vaping status: Never Used  Substance and Sexual Activity   Alcohol use: Yes   Drug use: No   Sexual activity: Yes    Birth control/protection: Other-see comments, None    Comment: Partner had vasectomy-1st intercourse 17 yo-More than 5 partners  Other Topics Concern   Not on file  Social History Narrative   Not on file   Social Drivers of Health   Financial Resource Strain: Not on file  Food Insecurity: No Food Insecurity (02/13/2023)   Hunger Vital Sign    Worried About Running Out of Food in the Last Year: Never true    Ran Out of Food in the Last Year: Never true  Transportation Needs: No Transportation Needs (02/13/2023)   PRAPARE - Administrator, Civil Service (Medical): No    Lack of Transportation (Non-Medical): No  Physical Activity: Not on file  Stress: Not on file  Social Connections: Not on file  Intimate Partner Violence: Not on file    ROS     Objective   BP 122/83   Pulse 75   Ht 5\' 7"  (1.702 m)   Wt 249 lb 1.9 oz (113 kg)   SpO2 98%   BMI 39.02 kg/m   Physical Exam General: Alert, oriented HEENT: PERRLA, EOMI, moist oral mucosa CV: Regular rate and rhythm Pulmonary: Clear bilaterally GI: Soft, normal bowel sounds Extremities: No pedal edema Skin: Normal gait, strength  equal bilaterally Psych: Pleasant affect     Assessment & Plan:   Encounter to establish care  Class 3 severe obesity due to excess calories in adult, unspecified BMI, unspecified whether serious comorbidity present [facility]) Assessment & Plan: Body mass index is 39.02 kg/m.  GLP 1 not indicated given her recent pancreatitis.  Patient agreeable to phentermine.  Discussed how this medication works, potential side effects.  Follow-up in  1 month adjust dose as needed.  Sent in referral to healthy weight loss.  Orders: -     Comprehensive metabolic panel -     Hemoglobin A1c -     Lipid panel -     Amb Ref to Medical Weight Management  Encounter for colorectal cancer screening -     Ambulatory referral to Gastroenterology  Genital herpes simplex, unspecified site -     valACYclovir HCl; TAKE 1 TABLET BY MOUTH ONCE DAILY INCREASE  TO  TWICE  DAILY  FOR  3  DAYS  DURING  OUTBREAK  Dispense: 90 tablet; Refill: 3  Acute pancreatitis without infection or necrosis, unspecified pancreatitis type Assessment & Plan: No repeat episodes of pancreatitis since her initial hospitalization.   Microcytosis -     CBC with Differential/Platelet -     Iron, TIBC and Ferritin Panel  Hx of herpes genitalis Assessment & Plan: Continue Valtrex daily suppressive therapy   Tobacco use Assessment & Plan: Recently quit.  Use tobacco from age [age] at an average of half pack per day.  Encourage continued cessation.   Essential hypertension Assessment & Plan: Continue HCTZ.  At goal today.   Other orders -     Phentermine HCl; Take 1 capsule (15 mg total) by mouth every morning.  Dispense: 30 capsule; Refill: 1 -     hydroCHLOROthiazide; Take 1 tablet (25 mg total) by mouth daily.  Dispense: 90 tablet; Refill: 3    Return in about 4 weeks (around 08/19/2023) for Weight.   Sandre Kitty, MD

## 2023-07-22 NOTE — Assessment & Plan Note (Signed)
 Recently quit.  Use tobacco from age [age] at an average of half pack per day.  Encourage continued cessation.

## 2023-07-22 NOTE — Assessment & Plan Note (Signed)
 Body mass index is 39.02 kg/m.  GLP 1 not indicated given her recent pancreatitis.  Patient agreeable to phentermine.  Discussed how this medication works, potential side effects.  Follow-up in 1 month adjust dose as needed.  Sent in referral to healthy weight loss.

## 2023-07-23 DIAGNOSIS — Z0289 Encounter for other administrative examinations: Secondary | ICD-10-CM

## 2023-07-23 LAB — CBC WITH DIFFERENTIAL/PLATELET
Basophils Absolute: 0 10*3/uL (ref 0.0–0.2)
Basos: 0 %
EOS (ABSOLUTE): 0.2 10*3/uL (ref 0.0–0.4)
Eos: 3 %
Hematocrit: 48 % — ABNORMAL HIGH (ref 34.0–46.6)
Hemoglobin: 15.4 g/dL (ref 11.1–15.9)
Immature Grans (Abs): 0 10*3/uL (ref 0.0–0.1)
Immature Granulocytes: 0 %
Lymphocytes Absolute: 2.4 10*3/uL (ref 0.7–3.1)
Lymphs: 35 %
MCH: 23.1 pg — ABNORMAL LOW (ref 26.6–33.0)
MCHC: 32.1 g/dL (ref 31.5–35.7)
MCV: 72 fL — ABNORMAL LOW (ref 79–97)
Monocytes Absolute: 0.3 10*3/uL (ref 0.1–0.9)
Monocytes: 4 %
Neutrophils Absolute: 4 10*3/uL (ref 1.4–7.0)
Neutrophils: 58 %
Platelets: 334 10*3/uL (ref 150–450)
RBC: 6.67 x10E6/uL — ABNORMAL HIGH (ref 3.77–5.28)
RDW: 16.7 % — ABNORMAL HIGH (ref 11.7–15.4)
WBC: 7 10*3/uL (ref 3.4–10.8)

## 2023-07-23 LAB — COMPREHENSIVE METABOLIC PANEL
ALT: 21 [IU]/L (ref 0–32)
AST: 29 [IU]/L (ref 0–40)
Albumin: 4.7 g/dL (ref 3.9–4.9)
Alkaline Phosphatase: 79 [IU]/L (ref 44–121)
BUN/Creatinine Ratio: 13 (ref 9–23)
BUN: 11 mg/dL (ref 6–24)
Bilirubin Total: 0.5 mg/dL (ref 0.0–1.2)
CO2: 24 mmol/L (ref 20–29)
Calcium: 10.3 mg/dL — ABNORMAL HIGH (ref 8.7–10.2)
Chloride: 98 mmol/L (ref 96–106)
Creatinine, Ser: 0.84 mg/dL (ref 0.57–1.00)
Globulin, Total: 3.2 g/dL (ref 1.5–4.5)
Glucose: 129 mg/dL — ABNORMAL HIGH (ref 70–99)
Potassium: 4.1 mmol/L (ref 3.5–5.2)
Sodium: 139 mmol/L (ref 134–144)
Total Protein: 7.9 g/dL (ref 6.0–8.5)
eGFR: 87 mL/min/{1.73_m2} (ref >59)

## 2023-07-23 LAB — IRON,TIBC AND FERRITIN PANEL
Ferritin: 38 ng/mL (ref 15–150)
Iron Saturation: 28 % (ref 15–55)
Iron: 103 ug/dL (ref 27–159)
Total Iron Binding Capacity: 362 ug/dL (ref 250–450)
UIBC: 259 ug/dL (ref 131–425)

## 2023-07-23 LAB — HEMOGLOBIN A1C
Est. average glucose Bld gHb Est-mCnc: 146 mg/dL
Hgb A1c MFr Bld: 6.7 % — ABNORMAL HIGH (ref 4.8–5.6)

## 2023-07-23 LAB — LIPID PANEL
Chol/HDL Ratio: 4.3 {ratio} (ref 0.0–4.4)
Cholesterol, Total: 249 mg/dL — ABNORMAL HIGH (ref 100–199)
HDL: 58 mg/dL (ref >39)
LDL Chol Calc (NIH): 161 mg/dL — ABNORMAL HIGH (ref 0–99)
Triglycerides: 164 mg/dL — ABNORMAL HIGH (ref 0–149)
VLDL Cholesterol Cal: 30 mg/dL (ref 5–40)

## 2023-07-24 ENCOUNTER — Encounter: Payer: Self-pay | Admitting: Family Medicine

## 2023-07-24 ENCOUNTER — Ambulatory Visit: Payer: Medicaid Other | Admitting: Family Medicine

## 2023-07-24 ENCOUNTER — Other Ambulatory Visit: Payer: Self-pay | Admitting: Family Medicine

## 2023-07-24 VITALS — BP 128/88 | HR 78 | Temp 98.0°F | Ht 68.0 in | Wt 242.0 lb

## 2023-07-24 DIAGNOSIS — Z6836 Body mass index (BMI) 36.0-36.9, adult: Secondary | ICD-10-CM | POA: Diagnosis not present

## 2023-07-24 DIAGNOSIS — E66812 Obesity, class 2: Secondary | ICD-10-CM | POA: Diagnosis not present

## 2023-07-24 DIAGNOSIS — E119 Type 2 diabetes mellitus without complications: Secondary | ICD-10-CM | POA: Insufficient documentation

## 2023-07-24 DIAGNOSIS — I10 Essential (primary) hypertension: Secondary | ICD-10-CM | POA: Diagnosis not present

## 2023-07-24 DIAGNOSIS — Z8719 Personal history of other diseases of the digestive system: Secondary | ICD-10-CM | POA: Insufficient documentation

## 2023-07-24 MED ORDER — METFORMIN HCL ER 500 MG PO TB24: 500.0000 mg | ORAL_TABLET | Freq: Every day | ORAL | 1 refills | Status: DC

## 2023-07-24 NOTE — Progress Notes (Signed)
 Office: [phone]  /  Fax: [phone]   Initial Visit  Robin Fox was seen in clinic today to evaluate for obesity. She is interested in losing weight to improve overall health and reduce the risk of weight related complications. She presents today to review program treatment options, initial physical assessment, and evaluation.     She was referred by: PCP  When asked what else they would like to accomplish? She states: Improve energy levels and physical activity, Reduce number of medications, Improve quality of life, and Improve appearance.  Lives with husband and 4 boys (47, 58 yo twin, 62 yo).  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  She maintained weight at 220 lb prior to that.  When asked how has your weight affected you? She states: Contributed to medical problems, Contributed to orthopedic problems or mobility issues, and Having fatigue  Some associated conditions: Hypertension, Diabetes, and Other: hx of pancreatitis  Contributing factors: Disruption of circadian rhythm / sleep disordered breathing, Moderate to high levels of stress, Eating patterns, and Menopause  Weight promoting medications identified: None  Current nutrition plan: None  Current level of physical activity: NEAT  Current or previous pharmacotherapy: None (not taking Phentermine listed)  Response to medication: Never tried medications   Past medical history includes:   Past Medical History:  Diagnosis Date   Anemia    hx    Essential hypertension 11/09/2011   Baseline labs, Testing 32/wks, serial growth ultrasound  Protein 96; cmp nml; plat 329  Growth 03/23/12 80%     Gestational diabetes    Neg 2 hr test after preg   Headache(784.0)    Herpes genitalia    2013 - 1st outbreak   Hx of herpes genitalis 11/21/2011   Acyclovir. Needs suppression at 35 weeks.     Hypertension    2008   Hypertension in  pregnancy, essential, antepartum 11/09/2011   Pre-eclampsia in third trimester    Seasonal allergies    Shortness of breath    w exertion   Trichomonas    Twins      Objective:   BP 128/88   Pulse 78   Temp 98 F (36.7 C)   Ht 5\' 8"  (1.727 m)   Wt 242 lb (109.8 kg)   SpO2 98%   BMI 36.80 kg/m  She was weighed on the bioimpedance scale: Body mass index is 36.8 kg/m.  Peak QIONGE:952 , Body Fat%:42.1, Visceral Fat Rating:11, Weight trend over the last 12 months: Increasing  General:  Alert, oriented and cooperative. Patient is in no acute distress.  Respiratory: Normal respiratory effort, no problems with respiration noted   Gait: able to ambulate independently  Mental Status: Normal mood and affect. Normal behavior. Normal judgment and thought content.   DIAGNOSTIC DATA REVIEWED:  BMET    Component Value Date/Time   NA 139 07/22/2023 0953   K 4.1 07/22/2023 0953   CL 98 07/22/2023 0953   CO2 24 07/22/2023 0953   GLUCOSE 129 (H) 07/22/2023 0953   GLUCOSE 101 (H) 08/02/2021 2040   GLUCOSE 96 01/03/2012 1050   BUN 11 07/22/2023 0953   CREATININE 0.84 07/22/2023 0953   CREATININE 0.83 04/06/2012 1112   CALCIUM 10.3 (H) 07/22/2023 0953   GFRNONAA >60 08/02/2021 2040   GFRAA 82 07/12/2020 0919   Lab Results  Component Value Date   HGBA1C 6.7 (H) 07/22/2023   HGBA1C 5.6 07/16/2016  No results found for: "INSULIN" CBC    Component Value Date/Time   WBC 7.0 07/22/2023 0953   WBC 8.3 08/02/2021 2040   RBC 6.67 (H) 07/22/2023 0953   RBC 6.16 (H) 08/02/2021 2040   HGB 15.4 07/22/2023 0953   HCT 48.0 (H) 07/22/2023 0953   PLT 334 07/22/2023 0953   MCV 72 (L) 07/22/2023 0953   MCH 23.1 (L) 07/22/2023 0953   MCH 23.1 (L) 08/02/2021 2040   MCHC 32.1 07/22/2023 0953   MCHC 32.1 08/02/2021 2040   RDW 16.7 (H) 07/22/2023 0953   Iron/TIBC/Ferritin/ %Sat    Component Value Date/Time   IRON 103 07/22/2023 0953   TIBC 362 07/22/2023 0953   FERRITIN 38 07/22/2023  0953   IRONPCTSAT 28 07/22/2023 0953   Lipid Panel     Component Value Date/Time   CHOL 249 (H) 07/22/2023 0953   TRIG 164 (H) 07/22/2023 0953   HDL 58 07/22/2023 0953   CHOLHDL 4.3 07/22/2023 0953   CHOLHDL 3.5 08/28/2016 0905   VLDL 15 08/28/2016 0905   LDLCALC 161 (H) 07/22/2023 0953   Hepatic Function Panel     Component Value Date/Time   PROT 7.9 07/22/2023 0953   ALBUMIN 4.7 07/22/2023 0953   AST 29 07/22/2023 0953   ALT 21 07/22/2023 0953   ALKPHOS 79 07/22/2023 0953   BILITOT 0.5 07/22/2023 0953      Component Value Date/Time   TSH 0.829 02/13/2023 1650   TSH 1.47 09/16/2017 1209     Assessment and Plan:   Type 2 diabetes mellitus without complication, without long-term current use of insulin (HCC) She has a new diagnosis of T2DM She has never taken medication for diabetes She has a + hx of GDM and a + fam hx of T2DM  Begin active plan for weight reduction with a prescribed low sugar/low carbohydrate high-protein, high-fiber diet next visit.  Consider use of metformin.  Class 2 severe obesity due to excess calories with serious comorbidity and body mass index (BMI) of 36.0 to 36.9 in adult [facility]) Reviewed weight history, program information.  Patient is motivated to begin medically supervised weight management  Essential hypertension Blood pressure is well-controlled on HCTZ 25 mg once daily. Look for blood pressure improvements with weight reduction Consider switch to an ACE inhibitor given her new diagnosis of type 2 diabetes  History of pancreatitis She notes a history of pancreatitis in 2022 not due to gallstones or medications. She would be considered higher risk for use of a GLP-1 receptor agonist due to this history She has cut back on intake of alcohol Is currently asymptomatic   Obesity Treatment / Action Plan:  Patient will work on garnering support from family and friends to begin weight loss journey. Will work on eliminating or reducing the  presence of highly palatable, calorie dense foods in the home. Will complete provided nutritional and psychosocial assessment questionnaire before the next appointment. Will be scheduled for indirect calorimetry to determine resting energy expenditure in a fasting state.  This will allow [place] to create a reduced calorie, high-protein meal plan to promote loss of fat mass while preserving muscle mass. Will think about ideas on how to incorporate physical activity into their daily routine. Counseled on the health benefits of losing 5%-15% of total body weight. Was counseled on nutritional approaches to weight loss and benefits of reducing processed foods and consuming plant-based foods and high quality protein as part of nutritional weight management. Was counseled on pharmacotherapy and role  as an adjunct in weight management.   Obesity Education Performed Today:  She was weighed on the bioimpedance scale and results were discussed and documented in the synopsis.  We discussed obesity as a disease and the importance of a more detailed evaluation of all the factors contributing to the disease.  We discussed the importance of long term lifestyle changes which include nutrition, exercise and behavioral modifications as well as the importance of customizing this to her specific health and social needs.  We discussed the benefits of reaching a healthier weight to alleviate the symptoms of existing conditions and reduce the risks of the biomechanical, metabolic and psychological effects of obesity.  Robin Hines Karg appears to be in the action stage of change and states they are ready to start intensive lifestyle modifications and behavioral modifications.  22 minutes was spent today on this visit including the above counseling, pre-visit chart review, and post-visit documentation.  Reviewed by clinician on day of visit: allergies, medications, problem list, medical history, surgical history,  family history, social history, and previous encounter notes pertinent to obesity diagnosis.    Seymour Bars, D.O. DABFM, [facility] [address] [place], [place] [address] [phone]

## 2023-08-05 ENCOUNTER — Ambulatory Visit: Payer: Medicaid Other | Admitting: Family Medicine

## 2023-08-05 ENCOUNTER — Encounter: Payer: Self-pay | Admitting: Family Medicine

## 2023-08-05 VITALS — BP 139/90 | HR 72 | Temp 98.0°F | Ht 68.0 in | Wt 242.0 lb

## 2023-08-05 DIAGNOSIS — R5383 Other fatigue: Secondary | ICD-10-CM | POA: Diagnosis not present

## 2023-08-05 DIAGNOSIS — I10 Essential (primary) hypertension: Secondary | ICD-10-CM | POA: Diagnosis not present

## 2023-08-05 DIAGNOSIS — Z1331 Encounter for screening for depression: Secondary | ICD-10-CM | POA: Diagnosis not present

## 2023-08-05 DIAGNOSIS — N951 Menopausal and female climacteric states: Secondary | ICD-10-CM | POA: Diagnosis not present

## 2023-08-05 DIAGNOSIS — R948 Abnormal results of function studies of other organs and systems: Secondary | ICD-10-CM | POA: Diagnosis not present

## 2023-08-05 DIAGNOSIS — Z7984 Long term (current) use of oral hypoglycemic drugs: Secondary | ICD-10-CM | POA: Diagnosis not present

## 2023-08-05 DIAGNOSIS — E119 Type 2 diabetes mellitus without complications: Secondary | ICD-10-CM

## 2023-08-05 DIAGNOSIS — G4730 Sleep apnea, unspecified: Secondary | ICD-10-CM

## 2023-08-05 DIAGNOSIS — Z8719 Personal history of other diseases of the digestive system: Secondary | ICD-10-CM | POA: Diagnosis not present

## 2023-08-05 DIAGNOSIS — R0602 Shortness of breath: Secondary | ICD-10-CM

## 2023-08-05 DIAGNOSIS — Z6836 Body mass index (BMI) 36.0-36.9, adult: Secondary | ICD-10-CM

## 2023-08-05 NOTE — Progress Notes (Signed)
 At a Glance:  Vitals Temp: 98 F (36.7 C) BP: (!) 139/90 Pulse Rate: 72 SpO2: 97 %   Anthropometric Measurements Height: 5\' 8"  (1.727 m) Weight: 242 lb (109.8 kg) BMI (Calculated): 36.8 Starting Weight: 242lb Peak Weight: 242lb   Body Composition  Body Fat %: 42.2 % Fat Mass (lbs): 102.2 lbs Muscle Mass (lbs): 133.2 lbs Total Body Water (lbs): 84.2 lbs Visceral Fat Rating : 11   Other Clinical Data RMR: 835 Fasting: Yes Labs: Yes Today's Visit #: 1 Starting Date: 08/05/23    EKG: Normal sinus rhythm, rate 72.  Indirect Calorimeter completed today shows a VO2 of 122 and a REE of 835.  Her calculated basal metabolic rate is 8469 thus her basal metabolic rate is worse than expected.  Chief Complaint:  Obesity   Subjective:  Robin Fox (MR# [id number]) is a 48 y.o. female who presents for evaluation and treatment of obesity and related comorbidities.   [name] is currently in the action stage of change and ready to dedicate time achieving and maintaining a healthier weight. [name] is interested in becoming our patient and working on intensive lifestyle modifications including (but not limited to) diet and exercise for weight loss.  [name] has been struggling with her weight. She has been unsuccessful in either losing weight, maintaining weight loss, or reaching her healthy weight goal. She would like to be 190 lb where she was in her 20s.  She gained weight with 3 pregnancies (one of which was twin pregnancy) and has hx of pre-eclampsia and gestational diabetes.  She has been under stress with her aging mom moving in with them.  She quit smoking last month, has been quite sedentary and has lacked motivation to exercise.  She denies overeating.  [name]'s habits were reviewed today and are as follows: Her family eats meals together, her desired weight loss is 50 lb, she started gaining weight after pregnancies and now with perimenopause. she has significant  food cravings issues, she snacks frequently in the evenings, she is frequently drinking liquids with calories, she frequently makes poor food choices, she frequently eats larger portions than normal, and she struggles with emotional eating.   Other Fatigue [place] admits to daytime somnolence and admits to waking up still tired. Patient has a history of symptoms of daytime fatigue. Kamilia generally gets 6 hours of sleep per night, and states that she has nightime awakenings. Snoring is present. Apneic episodes are not present. Epworth Sleepiness Score is 7.   Shortness of Breath [name] notes increasing shortness of breath with exercising and seems to be worsening over time with weight gain. She notes getting out of breath sooner with activity than she used to. This has gotten worse recently. [name] denies shortness of breath at rest or orthopnea.   Depression Screen Seleni's Food and Mood (modified PHQ-9) score was 13.     07/22/2023    9:24 AM  Depression screen PHQ 2/9  Decreased Interest 0  Down, Depressed, Hopeless 0  PHQ - 2 Score 0  Altered sleeping 1  Tired, decreased energy 2  Change in appetite 0  Feeling bad or failure about yourself  0  Trouble concentrating 0  Moving slowly or fidgety/restless 0  Suicidal thoughts 0  PHQ-9 Score 3  Difficult doing work/chores Somewhat difficult     Assessment and Plan:   Other Fatigue [name] does feel that her weight is causing her energy to be lower than it should be. Fatigue may be related to  obesity, depression or many other causes. Labs will be ordered, and in the meanwhile, [name] will focus on self care including making healthy food choices, increasing physical activity and focusing on stress reduction.  Shortness of Breath [name] does feel that she gets out of breath more easily that she used to when she exercises. [name]'s shortness of breath appears to be obesity related and exercise induced. She has agreed to work on weight  loss and gradually increase exercise to treat her exercise induced shortness of breath. Will continue to monitor closely.  [name] had a positive depression screening. Depression is commonly associated with obesity and often results in emotional eating behaviors. We will monitor this closely and work on CBT to help improve the non-hunger eating patterns. Referral to Psychology may be required if no improvement is seen as she continues in our clinic.    Problem List Items Addressed This Visit     Essential hypertension DBP 90 On hydrochlorothiazide 25 mg daily Reports good compliance  Begin active plan for weight reduction Limit high sodium restaurant meals    Obesity   History of pancreatitis Acute idiopathic pancreatitis 3 years ago With her hx, she is not a good candidate for use of a GLP-1 RA to treat T2DM    Type 2 diabetes mellitus without complication, without long-term current use of insulin (HCC)   Low basal metabolic rate Reviewed results of her IC and her expected metabolic rate is 1191 ca/ day and her IC was 835 cal/ day which goes along with her story of easy weight gain the past 6 mos.  Denies acute illness, injury, surgery or new meds.  She has been undereating esp high quality foods, has been sedentary -- worsened by depressed mood and has been getting poor quality sleep with snoring.    Will work on Medical laboratory scientific officer 1 meal plan (lean protein with each meal ~900 cal/ day and 90 g of protein daily), obtain sleep study and work on improving sleep time and will gradually increase physical activity.    Perimenopausal 9 mos since last period Having sleep disruption and vasomotor flushing, mood changes Recommend f/u with OB-gyn She may be a good candidate for Venlaxafine.   Other Visit Diagnoses       SOBOE (shortness of breath on exertion)    -  Primary     Other fatigue       Relevant Orders   VITAMIN D 25 Hydroxy (Vit-D Deficiency, Fractures)   TSH   T4, free   T3   Insulin,  random   Folate   Vitamin B12     Depression screen           Jaz is currently in the action stage of change and her goal is to continue with weight loss efforts. I recommend [name] begin the structured treatment plan as follows:  She has agreed to Category 1 Plan Replace breakfast on meal plan with a Premier Protein shake + one serving of fresh fruit  Exercise goals: All adults should avoid inactivity. Some activity is better than none, and adults who participate in any amount of physical activity, gain some health benefits.  Behavioral modification strategies:increasing lean protein intake, increase H2O intake, decrease liquid calories, decrease ETOH, increase high fiber foods, decreasing eating out, no skipping meals, meal planning and cooking strategies, keeping healthy foods in the home, better snacking choices, planning for success, and decrease junk food   She was informed of the importance of frequent follow-up visits to  maximize her success with intensive lifestyle modifications for her multiple health conditions. She was informed we would discuss her lab results at her next visit unless there is a critical issue that needs to be addressed sooner. Barbara agreed to keep her next visit at the agreed upon time to discuss these results.  Objective:  General: Cooperative, alert, well developed, in no acute distress. HEENT: Conjunctivae and lids unremarkable. Cardiovascular: Regular rhythm.  Lungs: Normal work of breathing. Neurologic: No focal deficits.   Lab Results  Component Value Date   CREATININE 0.84 07/22/2023   BUN 11 07/22/2023   NA 139 07/22/2023   K 4.1 07/22/2023   CL 98 07/22/2023   CO2 24 07/22/2023   Lab Results  Component Value Date   ALT 21 07/22/2023   AST 29 07/22/2023   ALKPHOS 79 07/22/2023   BILITOT 0.5 07/22/2023   Lab Results  Component Value Date   HGBA1C 6.7 (H) 07/22/2023   HGBA1C 6.2 (H) 06/26/2020   HGBA1C 5.6 10/11/2019   HGBA1C 5.6  07/16/2016   No results found for: "INSULIN" Lab Results  Component Value Date   TSH 0.829 02/13/2023   Lab Results  Component Value Date   CHOL 249 (H) 07/22/2023   HDL 58 07/22/2023   LDLCALC 161 (H) 07/22/2023   TRIG 164 (H) 07/22/2023   CHOLHDL 4.3 07/22/2023   Lab Results  Component Value Date   WBC 7.0 07/22/2023   HGB 15.4 07/22/2023   HCT 48.0 (H) 07/22/2023   MCV 72 (L) 07/22/2023   PLT 334 07/22/2023   Lab Results  Component Value Date   IRON 103 07/22/2023   TIBC 362 07/22/2023   FERRITIN 38 07/22/2023    Attestation Statements:  Reviewed by clinician on day of visit: allergies, medications, problem list, medical history, surgical history, family history, social history, and previous encounter notes.  Time spent on visit including pre-visit chart review and post-visit charting and care was 45 minutes.   Glennis Brink, DO

## 2023-08-06 LAB — VITAMIN D 25 HYDROXY (VIT D DEFICIENCY, FRACTURES): Vit D, 25-Hydroxy: 32 ng/mL (ref 30.0–100.0)

## 2023-08-06 LAB — INSULIN, RANDOM: INSULIN: 18.1 u[IU]/mL (ref 2.6–24.9)

## 2023-08-06 LAB — TSH: TSH: 1.61 u[IU]/mL (ref 0.450–4.500)

## 2023-08-06 LAB — VITAMIN B12: Vitamin B-12: 688 pg/mL (ref 232–1245)

## 2023-08-06 LAB — T3: T3, Total: 99 ng/dL (ref 71–180)

## 2023-08-06 LAB — FOLATE: Folate: 8.7 ng/mL (ref >3.0)

## 2023-08-06 LAB — T4, FREE: Free T4: 1.19 ng/dL (ref 0.82–1.77)

## 2023-08-11 ENCOUNTER — Encounter: Payer: Self-pay | Admitting: Pediatrics

## 2023-08-19 ENCOUNTER — Ambulatory Visit: Payer: Medicaid Other | Admitting: Family Medicine

## 2023-08-19 ENCOUNTER — Encounter: Payer: Self-pay | Admitting: Family Medicine

## 2023-08-19 VITALS — BP 115/77 | HR 75 | Ht 68.0 in | Wt 246.1 lb

## 2023-08-19 DIAGNOSIS — E119 Type 2 diabetes mellitus without complications: Secondary | ICD-10-CM

## 2023-08-19 DIAGNOSIS — R718 Other abnormality of red blood cells: Secondary | ICD-10-CM | POA: Diagnosis not present

## 2023-08-19 DIAGNOSIS — Z6837 Body mass index (BMI) 37.0-37.9, adult: Secondary | ICD-10-CM

## 2023-08-19 DIAGNOSIS — E66812 Obesity, class 2: Secondary | ICD-10-CM | POA: Diagnosis not present

## 2023-08-19 MED ORDER — ROSUVASTATIN CALCIUM 5 MG PO TABS
5.0000 mg | ORAL_TABLET | Freq: Every day | ORAL | 3 refills | Status: DC
Start: 2023-08-19 — End: 2023-11-27

## 2023-08-19 NOTE — Patient Instructions (Addendum)
 It was nice to see you today,  We addressed the following topics today: -You have type 2 diabetes.  Continue taking the metformin.  I have added Crestor for cholesterol. - You will need to get yearly eye exams to check for diabetic retinopathy. - We will reevaluate your A1c and cholesterol levels in 3 months - Your CBC showed a microcytosis that may be consistent with something like thalassemia trait.  In order to confirm this I am ordering a blood test.  We will discuss results when we get it - Asked the healthy weight and wellness provider if you can move your dinner back a few hours for a few can have snacks after dinner.  High-protein snacks like deli meat or peanut butter are more filling than things like fruits.  Have a great day,  Frederic Jericho, MD

## 2023-08-19 NOTE — Progress Notes (Unsigned)
   Established Patient Office Visit  Subjective   Patient ID: Robin Fox, female    DOB: August 23, 1975  Age: 48 y.o. MRN: [id number]  No chief complaint on file.   HPI  Weight-going to healthy weight and wellness.  Taking phentermine?  246.12  Type 2 diabetes-new diagnosis.  Started on metformin.  Microcytosis-thalassemia?  Sickle cell?  Hemoglobin electrophoresis?  Glucometer  143 at [facility]   Dinner - 4-5 pm.  Snacks. Talk to them tomorrow.     The 10-year ASCVD risk score (Arnett DK, et al., 2019) is: 9.5%  Health Maintenance Due  Topic Date Due   COVID-19 Vaccine (1) Never done   Pneumococcal Vaccine 104-78 Years old (1 of 2 - PCV) Never done   Diabetic kidney evaluation - Urine ACR  04/27/2013   Colonoscopy  Never done   DTaP/Tdap/Td (2 - Td or Tdap) 04/22/2022   INFLUENZA VACCINE  12/26/2022      Objective:     There were no vitals taken for this visit. {Vitals History (Optional):23777}  Physical Exam   No results found for any visits on 08/19/23.      Assessment & Plan:   There are no diagnoses linked to this encounter.   No follow-ups on file.    Sandre Kitty, MD

## 2023-08-20 ENCOUNTER — Ambulatory Visit (AMBULATORY_SURGERY_CENTER)

## 2023-08-20 ENCOUNTER — Telehealth: Payer: Self-pay | Admitting: *Deleted

## 2023-08-20 ENCOUNTER — Encounter: Payer: Self-pay | Admitting: Family Medicine

## 2023-08-20 ENCOUNTER — Ambulatory Visit: Payer: Medicaid Other | Admitting: Family Medicine

## 2023-08-20 VITALS — Ht 68.0 in | Wt 245.0 lb

## 2023-08-20 VITALS — BP 127/85 | HR 78 | Temp 97.7°F | Ht 68.0 in | Wt 242.0 lb

## 2023-08-20 DIAGNOSIS — E119 Type 2 diabetes mellitus without complications: Secondary | ICD-10-CM | POA: Diagnosis not present

## 2023-08-20 DIAGNOSIS — Z7984 Long term (current) use of oral hypoglycemic drugs: Secondary | ICD-10-CM

## 2023-08-20 DIAGNOSIS — Z6836 Body mass index (BMI) 36.0-36.9, adult: Secondary | ICD-10-CM

## 2023-08-20 DIAGNOSIS — R7989 Other specified abnormal findings of blood chemistry: Secondary | ICD-10-CM | POA: Insufficient documentation

## 2023-08-20 DIAGNOSIS — G4730 Sleep apnea, unspecified: Secondary | ICD-10-CM | POA: Diagnosis not present

## 2023-08-20 DIAGNOSIS — R948 Abnormal results of function studies of other organs and systems: Secondary | ICD-10-CM | POA: Diagnosis not present

## 2023-08-20 DIAGNOSIS — E66812 Obesity, class 2: Secondary | ICD-10-CM

## 2023-08-20 DIAGNOSIS — Z1211 Encounter for screening for malignant neoplasm of colon: Secondary | ICD-10-CM

## 2023-08-20 DIAGNOSIS — R718 Other abnormality of red blood cells: Secondary | ICD-10-CM | POA: Insufficient documentation

## 2023-08-20 LAB — MICROALBUMIN / CREATININE URINE RATIO
Creatinine, Urine: 303.4 mg/dL
Microalb/Creat Ratio: 5 mg/g{creat} (ref 0–29)
Microalbumin, Urine: 14.7 ug/mL

## 2023-08-20 MED ORDER — SUFLAVE 178.7 G PO SOLR: 1.0000 | Freq: Once | ORAL | 0 refills | Status: AC

## 2023-08-20 MED ORDER — LOMAIRA 8 MG PO TABS: ORAL_TABLET | ORAL | 0 refills | Status: DC

## 2023-08-20 NOTE — Assessment & Plan Note (Signed)
 Continue to follow diet plan established with healthy weight and wellness.

## 2023-08-20 NOTE — Patient Instructions (Addendum)
 Keep food plan as is  Track protein intake on the MyFitnessPal ap You should be getting in ~1000 cal-1200 cal/ day This should include 100-130 g of protein daily  Hydrate well with water  [facility] Aim for 30 min of cardio and 20-30 min of weight training 4-5 days/ wk  Begin Lomaira 8 mg tab 30 min before breakfast and at 2 pm daily Begin OTC vitamin D3 2,000 international units  daily  Call if any problems or questions  HOLD Lomaira the day before and the day of your colonoscopy

## 2023-08-20 NOTE — Telephone Encounter (Signed)
 Prior authorization done via cover my meds for patients Qsymia. Waiting on determination.

## 2023-08-20 NOTE — Progress Notes (Signed)

## 2023-08-20 NOTE — Progress Notes (Signed)
 Office: [phone]  /  Fax: [phone]  WEIGHT SUMMARY AND BIOMETRICS  Starting Date: 08/05/23  Starting Weight: 242lb   Weight Lost Since Last Visit: 0lb   Vitals Temp: 97.7 F (36.5 C) BP: 127/85 Pulse Rate: 78 SpO2: 100 %   Body Composition  Body Fat %: 43.4 % Fat Mass (lbs): 105.2 lbs Muscle Mass (lbs): 130.4 lbs Total Body Water (lbs): 87.4 lbs Visceral Fat Rating : 11    HPI  Chief Complaint: OBESITY  Robin Fox is here to discuss her progress with her obesity treatment plan. She is on the the Category 1 Plan and states she is following her eating plan approximately 85 % of the time. She states she is exercising 30 minutes 2 times per week.  Interval History:  Since last office visit she is down 0 lb She is working on staying on cat 1 meal plan Denies skipping meals Drinking a Premier Protein shake and a piece of fruit for breakfast [facility] and plans to start going in the mornings She is doing well on metformin XR 500 mg daily started by her PCP last month for T2DM She has some hunger at night Family is not very supportive  Pharmacotherapy: none  PHYSICAL EXAM:  Blood pressure 127/85, pulse 78, temperature 97.7 F (36.5 C), height 5\' 8"  (1.727 m), weight 242 lb (109.8 kg), SpO2 100%. Body mass index is 36.8 kg/m.  General: She is overweight, cooperative, alert, well developed, and in no acute distress. PSYCH: Has normal mood, affect and thought process.   Lungs: Normal breathing effort, no conversational dyspnea.   ASSESSMENT AND PLAN  TREATMENT PLAN FOR OBESITY:  Recommended Dietary Goals  Robin Fox is currently in the action stage of change. As such, her goal is to continue weight management plan. She has agreed to the Category 1 Plan and keeping a food journal and adhering to recommended goals of 1000-1200 calories and 100-130 g of  protein.  Behavioral Intervention  We discussed the following Behavioral Modification Strategies  today: increasing lean protein intake to established goals, increasing fiber rich foods, increasing water intake , work on meal planning and preparation, work on Counselling psychologist calories using tracking application, keeping healthy foods at home, identifying sources and decreasing liquid calories, work on managing stress, creating time for self-care and relaxation, avoiding temptations and identifying enticing environmental cues, continue to work on implementation of reduced calorie nutritional plan, continue to practice mindfulness when eating, planning for success, and continue to work on maintaining a reduced calorie state, getting the recommended amount of protein, incorporating whole foods, making healthy choices, staying well hydrated and practicing mindfulness when eating..  Additional resources provided today: NA  Recommended Physical Activity Goals  Robin Fox has been advised to work up to 150 minutes of moderate intensity aerobic activity a week and strengthening exercises 2-3 times per week for cardiovascular health, weight loss maintenance and preservation of muscle mass.   She has agreed to [employer] strengthening exercises with a goal of 2-3 sessions a week  and Start aerobic activity with a goal of 150 minutes a week at moderate intensity.  [facility] Meet with trainer to start with weight training machines  Pharmacotherapy changes for the treatment of obesity: begin Lomaira 8 mg tab bid PDMP reviewed Informed consent signed  ASSOCIATED CONDITIONS ADDRESSED TODAY  Type 2 diabetes mellitus without complication, without long-term current use of insulin (HCC) Lab Results  Component  Value Date   HGBA1C 6.7 (H) 07/22/2023  Recently started metformin 500 mg XR once daily by Dr. Constance Goltz.  She is tolerating this well.  She has recently started working on her prescribed dietary plan which is low in added sugar and refined carbohydrates.  She plans  to add in regular exercise at the [facility].  She is not a good candidate for GLP-1 receptor agonist due to her history of idiopathic pancreatitis.  Consider use of an SGLT2 inhibitor which may also assist with weight reduction.  She is planning to recheck labs with her PCP in 3 months.  Class 2 severe obesity due to excess calories with serious comorbidity and body mass index (BMI) of 36.0 to 36.9 in adult [facility]) Patient has been frustrated by her lack of weight loss despite her compliance with her prescribed meal plan.  This is likely due to her very low metabolic rate.  She is still highly motivated.  She has never used antiobesity medication and is not a good candidate for use of a GLP-1 receptor agonist due to her history of pancreatitis.  She did not take the prescribed phentermine 15 mg by her PCP and is worried about heart palpitations.  Her blood pressure and heart rate are well-controlled.  Informed consent has been signed.  Begin Lomaira 8 mg tab twice daily as prescribed. Robin Fox; 1 tab 30 min before breakfast and 1 tab po at 2 pm daily  Dispense: 28 tablet; Refill: 0  Low basal metabolic rate Reviewed results of her indirect calorimetry last visit.  She had a very low basal metabolic rate at 161 cal/day.  She was started on category 1 meal plan with a meal replacement for breakfast averaging anywhere between 900- 1200 cal/day.  Will have her track her caloric intake on my fitness pal and record her protein intake with a target goal of 100 to 130 g/day.  Actively working on increasing metabolic rate by prioritizing high-quality sleep with 7 to 8 hours at night, increase protein intake with target goals reviewed and increasing regular exercise to 30 to 60 minutes 4 to 5 days a week.  Sleep-disordered breathing She has been contacted following referral to sleep medicine for scheduling polysomnogram.  Follow-up results.  Untreated sleep apnea may be causing her low metabolic rate.  Low vitamin D  levels Reviewed lab results with patient.  Her vitamin D level is low normal at 32.  She does complain of fatigue.  She is currently not taking a vitamin D supplement.  Recommend over-the-counter vitamin D3 2000 IU once daily.     She was informed of the importance of frequent follow up visits to maximize her success with intensive lifestyle modifications for her multiple health conditions.   ATTESTASTION STATEMENTS:  Reviewed by clinician on day of visit: allergies, medications, problem list, medical history, surgical history, family history, social history, and previous encounter notes pertinent to obesity diagnosis.   I have personally spent 30 minutes total time today in preparation, patient care, nutritional counseling and documentation for this visit, including the following: review of clinical lab tests; review of medical tests/procedures/services.      Glennis Brink, DO DABFM, DABOM [facility] [address] [place], [place] [address] [phone]

## 2023-08-20 NOTE — Assessment & Plan Note (Signed)
 No anemia.  Patient consistently has MCVs in the low 70s to high 60s.  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  Patient agreeable to get hemoglobin for assessment of inherited anemia

## 2023-08-20 NOTE — Assessment & Plan Note (Signed)
 Continue metformin.  UACR today.  Repeat A1c in 3 months.

## 2023-08-21 ENCOUNTER — Telehealth: Payer: Self-pay

## 2023-08-21 ENCOUNTER — Encounter: Payer: Self-pay | Admitting: Family Medicine

## 2023-08-21 LAB — HGB FRACTIONATION CASCADE
Hgb A2: 2.4 % (ref 1.8–3.2)
Hgb A: 97.6 % (ref 96.4–98.8)
Hgb F: 0 % (ref 0.0–2.0)
Hgb S: 0 %

## 2023-08-21 NOTE — Telephone Encounter (Signed)
 Copied from CRM [id number]. Topic: Clinical - Lab/Test Results >> Aug 21, 2023  2:42 PM Fuller Mandril wrote: Reason for CRM: Patient called. Has a question about lab results. I let her know provider has not left a note for labs yet. She would like a call back with explanation of results. Thanks You     Already sent message to the PCP 08/21/2023

## 2023-08-25 NOTE — Telephone Encounter (Signed)
 Prior authorization denied for patients Qsymia.

## 2023-08-26 NOTE — Telephone Encounter (Signed)
 See if she would like to get her Qsymia from [facility] mail order pharmacy for $99/ month?

## 2023-08-27 ENCOUNTER — Ambulatory Visit: Payer: Medicaid Other | Admitting: Family Medicine

## 2023-09-08 ENCOUNTER — Institutional Professional Consult (permissible substitution): Admitting: Neurology

## 2023-09-08 ENCOUNTER — Telehealth: Payer: Self-pay | Admitting: Pediatrics

## 2023-09-08 NOTE — Telephone Encounter (Signed)
 Inbound call from patient stating she has a sore throat and cough. Requesting a call back to be advised on proceeding with 4/17 colonoscopy. Please advise, thank you.

## 2023-09-08 NOTE — Telephone Encounter (Signed)
 Returned patient call.  Patient with sore throat, may take  breathing treatment. Patient w/ hx of reactive airway.  Patient denies fever or other symptoms.  Advised patient to reevaluate 04/16, if she is still feeling unwell or symptomatic, please call to reschedule colonoscopy.  Patient voiced understanding and agreed with this plan.

## 2023-09-10 NOTE — Progress Notes (Signed)
 [facility] History and Physical   Primary Care Physician:  Sandre Kitty, MD   Reason for Procedure:  Colorectal cancer screening  Plan:    Screening colonoscopy     HPI: Robin Fox is a 48 y.o. female undergoing screening colonoscopy for colorectal cancer screening.  No prior colonoscopy.  Records indicate a family history of colorectal cancer in a maternal aunt but no first-degree relatives.  No family history of colon polyps.  Patient denies change in bowel habits or rectal bleeding.   Past Medical History:  Diagnosis Date   Anemia    hx    Anxiety    Diabetes (HCC)    Essential hypertension 11/09/2011   Baseline labs, Testing 32/wks, serial growth ultrasound  Protein 96; cmp nml; plat 329  Growth 03/23/12 80%     Gestational diabetes    Neg 2 hr test after preg   Headache(784.0)    Herpes genitalia    2013 - 1st outbreak   Hx of herpes genitalis 11/21/2011   Acyclovir. Needs suppression at 35 weeks.     Hyperlipidemia    Hypertension    2008   Hypertension in pregnancy, essential, antepartum 11/09/2011   Joint pain    Osteoarthritis    Pancreatic disease    Pre-eclampsia in third trimester    Seasonal allergies    Shortness of breath    w exertion   Trichomonas    Twins     Past Surgical History:  Procedure Laterality Date   CESAREAN SECTION     X 2   CESAREAN SECTION  04/21/2012   Procedure: CESAREAN SECTION;  Surgeon: Adam Phenix, MD;  Location: [facility];  Service: Obstetrics;  Laterality: N/A;   TOOTH EXTRACTION      Prior to Admission medications   Medication Sig Start Date End Date Taking? Authorizing Provider  acyclovir (ZOVIRAX) 400 MG tablet Take 400 mg by mouth 2 (two) times daily. As needed per pt    [provider]  hydrochlorothiazide (HYDRODIURIL) 25 MG tablet Take 1 tablet (25 mg total) by mouth daily. 07/22/23   Sandre Kitty, MD  metFORMIN (GLUCOPHAGE-XR) 500 MG 24 hr tablet Take 1 tablet (500 mg  total) by mouth daily with breakfast. 07/24/23   Sandre Kitty, MD  ondansetron (ZOFRAN-ODT) 4 MG disintegrating tablet Take 1 tablet (4 mg total) by mouth every 8 (eight) hours as needed for nausea or vomiting. Patient not taking: Reported on 08/05/2023 06/16/22   Immordino, Jeannett Senior, FNP  Phentermine HCl (LOMAIRA) 8 MG TABS 1 tab 30 min before breakfast and 1 tab po at 2 pm daily Patient not taking: Reported on 08/20/2023 08/20/23   Bowen, Scot Jun, DO  rosuvastatin (CRESTOR) 5 MG tablet Take 1 tablet (5 mg total) by mouth daily. 08/19/23   Sandre Kitty, MD    Current Outpatient Medications  Medication Sig Dispense Refill   acyclovir (ZOVIRAX) 400 MG tablet Take 400 mg by mouth 2 (two) times daily. As needed per pt     hydrochlorothiazide (HYDRODIURIL) 25 MG tablet Take 1 tablet (25 mg total) by mouth daily. 90 tablet 3   metFORMIN (GLUCOPHAGE-XR) 500 MG 24 hr tablet Take 1 tablet (500 mg total) by mouth daily with breakfast. 90 tablet 1   rosuvastatin (CRESTOR) 5 MG tablet Take 1 tablet (5 mg total) by mouth daily. 90 tablet 3   ondansetron (ZOFRAN-ODT) 4 MG disintegrating tablet Take 1 tablet (4 mg total) by mouth every 8 (eight)  hours as needed for nausea or vomiting. (Patient not taking: Reported on 09/11/2023) 20 tablet 0   Phentermine HCl (LOMAIRA) 8 MG TABS 1 tab 30 min before breakfast and 1 tab po at 2 pm daily (Patient not taking: Reported on 09/11/2023) 28 tablet 0   Current Facility-Administered Medications  Medication Dose Route Frequency Provider Last Rate Last Admin   0.9 %  sodium chloride infusion  500 mL Intravenous Continuous Hernando Reali, Scarlette Currier, MD        Allergies as of 09/11/2023 - Review Complete 09/11/2023  Allergen Reaction Noted   Penicillins Hives, Shortness Of Breath, and Swelling 04/23/2011    Family History  Problem Relation Age of Onset   Hyperlipidemia Mother    Diabetes Mother    Hypertension Mother    Stroke Mother    Heart disease Mother    Depression  Mother    Anxiety disorder Mother    Alcohol abuse Mother    Schizophrenia Mother    Obesity Mother    Hypertension Brother    Diabetes Brother    Stomach cancer Brother    Hypertension Brother    Diabetes Brother    Colon cancer Maternal Aunt    Stroke Maternal Grandmother    Heart disease Maternal Grandfather 45   Esophageal cancer Neg Hx    Rectal cancer Neg Hx     Social History   Socioeconomic History   Marital status: Married    Spouse name: Not on file   Number of children: Not on file   Years of education: Not on file   Highest education level: Not on file  Occupational History   Not on file  Tobacco Use   Smoking status: Some Days    Current packs/day: 0.00    Average packs/day: 0.3 packs/day for 17.0 years (4.3 ttl pk-yrs)    Types: Cigarettes    Start date: 09/11/2002    Last attempt to quit: 09/11/2019    Years since quitting: 4.0   Smokeless tobacco: Never  Vaping Use   Vaping status: Never Used  Substance and Sexual Activity   Alcohol use: Yes   Drug use: No   Sexual activity: Yes    Birth control/protection: Other-see comments, None    Comment: Partner had vasectomy-1st intercourse 17 yo-More than 5 partners  Other Topics Concern   Not on file  Social History Narrative   Not on file   Social Drivers of Health   Financial Resource Strain: Not on file  Food Insecurity: No Food Insecurity (02/13/2023)   Hunger Vital Sign    Worried About Running Out of Food in the Last Year: Never true    Ran Out of Food in the Last Year: Never true  Transportation Needs: No Transportation Needs (02/13/2023)   PRAPARE - Administrator, Civil Service (Medical): No    Lack of Transportation (Non-Medical): No  Physical Activity: Not on file  Stress: Not on file  Social Connections: Not on file  Intimate Partner Violence: Not on file    Review of Systems:  All other review of systems negative except as mentioned in the HPI.  Physical Exam: Vital  signs BP (!) 164/125   Pulse 69   Temp 97.8 F (36.6 C) (Temporal)   Resp 14   Ht 5\' 8"  (1.727 m)   Wt 245 lb (111.1 kg)   SpO2 100%   BMI 37.25 kg/m   General:   Alert,  Well-developed, well-nourished, pleasant and cooperative in  NAD Airway:  Mallampati 3 Lungs:  Clear throughout to auscultation.   Heart:  Regular rate and rhythm; no murmurs, clicks, rubs,  or gallops. Abdomen:  Soft, nontender and nondistended. Normal bowel sounds.   Neuro/Psych:  Normal mood and affect. A and O x 3  Eugenia Hess, MD [facility]

## 2023-09-11 ENCOUNTER — Ambulatory Visit (AMBULATORY_SURGERY_CENTER): Admitting: Pediatrics

## 2023-09-11 ENCOUNTER — Encounter: Payer: Self-pay | Admitting: Pediatrics

## 2023-09-11 VITALS — BP 130/74 | HR 77 | Temp 97.8°F | Resp 20 | Ht 68.0 in | Wt 245.0 lb

## 2023-09-11 DIAGNOSIS — D123 Benign neoplasm of transverse colon: Secondary | ICD-10-CM

## 2023-09-11 DIAGNOSIS — K635 Polyp of colon: Secondary | ICD-10-CM

## 2023-09-11 DIAGNOSIS — D128 Benign neoplasm of rectum: Secondary | ICD-10-CM

## 2023-09-11 DIAGNOSIS — K6389 Other specified diseases of intestine: Secondary | ICD-10-CM | POA: Diagnosis not present

## 2023-09-11 DIAGNOSIS — K648 Other hemorrhoids: Secondary | ICD-10-CM

## 2023-09-11 DIAGNOSIS — D125 Benign neoplasm of sigmoid colon: Secondary | ICD-10-CM

## 2023-09-11 DIAGNOSIS — D124 Benign neoplasm of descending colon: Secondary | ICD-10-CM | POA: Diagnosis not present

## 2023-09-11 DIAGNOSIS — F419 Anxiety disorder, unspecified: Secondary | ICD-10-CM | POA: Diagnosis not present

## 2023-09-11 DIAGNOSIS — E119 Type 2 diabetes mellitus without complications: Secondary | ICD-10-CM | POA: Diagnosis not present

## 2023-09-11 DIAGNOSIS — Z1211 Encounter for screening for malignant neoplasm of colon: Secondary | ICD-10-CM | POA: Diagnosis not present

## 2023-09-11 DIAGNOSIS — I10 Essential (primary) hypertension: Secondary | ICD-10-CM | POA: Diagnosis not present

## 2023-09-11 DIAGNOSIS — K621 Rectal polyp: Secondary | ICD-10-CM

## 2023-09-11 MED ORDER — SODIUM CHLORIDE 0.9 % IV SOLN: 500.0000 mL | INTRAVENOUS | Status: DC

## 2023-09-11 NOTE — Progress Notes (Signed)
Vs nad trans to pacu

## 2023-09-11 NOTE — Patient Instructions (Addendum)
 Repeat colonoscopy for surveillance based on                            pathology results.                           - The findings and recommendations were discussed                            with the designated responsible adult.                           - Return to referring physician.                           - Patient has a contact number available for                            emergencies. The signs and symptoms of potential                            delayed complications were discussed with the                            patient. Return to normal activities tomorrow.                            Written discharge instructions were provided to the                            patient.  Handout on polyps given.      YOU HAD AN ENDOSCOPIC PROCEDURE TODAY AT THE [facility]:   Refer to the procedure report that was given to you for any specific questions about what was found during the examination.  If the procedure report does not answer your questions, please call your gastroenterologist to clarify.  If you requested that your care partner not be given the details of your procedure findings, then the procedure report has been included in a sealed envelope for you to review at your convenience later.  YOU SHOULD EXPECT: Some feelings of bloating in the abdomen. Passage of more gas than usual.  Walking can help get rid of the air that was put into your GI tract during the procedure and reduce the bloating. If you had a lower endoscopy (such as a colonoscopy or flexible sigmoidoscopy) you may notice spotting of blood in your stool or on the toilet paper. If you underwent a bowel prep for your procedure, you may not have a normal bowel movement for a few days.  Please Note:  You might notice some irritation and congestion in your nose or some drainage.  This is from the oxygen used during your procedure.  There is no need for concern and it should clear up in a day or  so.  SYMPTOMS TO REPORT IMMEDIATELY:  Following lower endoscopy (colonoscopy or flexible sigmoidoscopy):  Excessive amounts of blood in the stool  Significant tenderness or worsening of abdominal pains  Swelling of the abdomen that is new, acute  Fever of 100F or higher  For urgent or emergent issues, a gastroenterologist can be reached at any hour by calling [phone]. Do not use MyChart messaging for urgent concerns.    DIET:  We do recommend a small meal at first, but then you may proceed to your regular diet.  Drink plenty of fluids but you should avoid alcoholic beverages for 24 hours.  ACTIVITY:  You should plan to take it easy for the rest of today and you should NOT DRIVE or use heavy machinery until tomorrow (because of the sedation medicines used during the test).    FOLLOW UP: Our staff will call the number listed on your records the next business day following your procedure.  We will call around 7:15- 8:00 am to check on you and address any questions or concerns that you may have regarding the information given to you following your procedure. If we do not reach you, we will leave a message.     If any biopsies were taken you will be contacted by phone or by letter within the next 1-3 weeks.  Please call us  at [phone] if you have not heard about the biopsies in 3 weeks.    SIGNATURES/CONFIDENTIALITY: You and/or your care partner have signed paperwork which will be entered into your electronic medical record.  These signatures attest to the fact that that the information above on your After Visit Summary has been reviewed and is understood.  Full responsibility of the confidentiality of this discharge information lies with you and/or your care-partner.

## 2023-09-11 NOTE — Progress Notes (Signed)
 Called to room to assist during endoscopic procedure.  Patient ID and intended procedure confirmed with present staff. Received instructions for my participation in the procedure from the performing physician.

## 2023-09-11 NOTE — Op Note (Signed)
 [facility] Patient Name: Robin Fox Procedure Date: 09/11/2023 9:17 AM MRN: [id number] Endoscopist: Maren Beach , MD, [id number] Age: 48 Referring MD:  Date of Birth: Jul 17, 1975 Gender: Female Account #: [id number] Procedure:                Colonoscopy Indications:              Screening for colorectal malignant neoplasm, This                            is the patient's first colonoscopy; Patient reports                            a family history of colorectal cancer in a maternal                            aunt in her 31s Medicines:                Monitored Anesthesia Care Procedure:                Pre-Anesthesia Assessment:                           - Prior to the procedure, a History and Physical                            was performed, and patient medications and                            allergies were reviewed. The patient's tolerance of                            previous anesthesia was also reviewed. The risks                            and benefits of the procedure and the sedation                            options and risks were discussed with the patient.                            All questions were answered, and informed consent                            was obtained. Prior Anticoagulants: The patient has                            taken no anticoagulant or antiplatelet agents. ASA                            Grade Assessment: III - A patient with severe                            systemic disease. After reviewing the risks and  benefits, the patient was deemed in satisfactory                            condition to undergo the procedure.                           After obtaining informed consent, the colonoscope                            was passed under direct vision. Throughout the                            procedure, the patient's blood pressure, pulse, and                            oxygen saturations were monitored  continuously. The                            CF [id number] #[id number] was introduced through the anus                            and advanced to the cecum, identified by                            appendiceal orifice and ileocecal valve. The                            colonoscopy was performed without difficulty. The                            patient tolerated the procedure well. The quality                            of the bowel preparation was good. The ileocecal                            valve, appendiceal orifice, and rectum were                            photographed. Scope In: 9:32:38 AM Scope Out: 9:55:32 AM Scope Withdrawal Time: 0 hours 16 minutes 24 seconds  Total Procedure Duration: 0 hours 22 minutes 54 seconds  Findings:                 The perianal and digital rectal examinations were                            normal. Pertinent negatives include normal                            sphincter tone and no palpable rectal lesions.                           Six sessile polyps were found in the recto-sigmoid  colon (4), descending colon (1) and transverse                            colon (1). The polyps were 3 to 4 mm in size. The                            polyps in the rectosigmoid colon had the appearance                            of hyperplastic polyps. These polyps were removed                            with a cold biopsy forceps. Resection and retrieval                            were complete.                           Internal hemorrhoids were found during retroflexion. Complications:            No immediate complications. Estimated blood loss:                            Minimal. Estimated Blood Loss:     Estimated blood loss was minimal. Impression:               - Six 3 to 4 mm polyps at the recto-sigmoid colon,                            in the descending colon and in the transverse                            colon, removed with a cold biopsy  forceps. Resected                            and retrieved. Polyps in the rectosigmoid colon                            appeared consistent with likely hyperplastic polyps.                           - Internal hemorrhoids. Recommendation:           - Discharge patient to home (ambulatory).                           - Await pathology results.                           - Repeat colonoscopy for surveillance based on                            pathology results.                           - The findings  and recommendations were discussed                            with the designated responsible adult.                           - Return to referring physician.                           - Patient has a contact number available for                            emergencies. The signs and symptoms of potential                            delayed complications were discussed with the                            patient. Return to normal activities tomorrow.                            Written discharge instructions were provided to the                            patient. Eugenia Hess, MD 09/11/2023 10:02:24 AM This report has been signed electronically.

## 2023-09-15 ENCOUNTER — Telehealth: Payer: Self-pay

## 2023-09-15 NOTE — Telephone Encounter (Signed)
  Follow up Call-     09/11/2023    8:36 AM  Call back number  Post procedure Call Back phone  # [phone]  Permission to leave phone message Yes     Patient questions:  Do you have a fever, pain , or abdominal swelling? No. Pain Score  0 *  Have you tolerated food without any problems? Yes.    Have you been able to return to your normal activities? Yes.    Do you have any questions about your discharge instructions: Diet   No. Medications  No. Follow up visit  No.  Do you have questions or concerns about your Care? No.  Actions: * If pain score is 4 or above: No action needed, pain <4.

## 2023-09-16 ENCOUNTER — Encounter: Payer: Self-pay | Admitting: Pediatrics

## 2023-09-16 LAB — SURGICAL PATHOLOGY

## 2023-09-17 ENCOUNTER — Encounter: Payer: Self-pay | Admitting: Family Medicine

## 2023-09-17 ENCOUNTER — Ambulatory Visit: Admitting: Family Medicine

## 2023-09-17 VITALS — BP 120/82 | HR 77 | Temp 98.1°F | Ht 68.0 in | Wt 237.0 lb

## 2023-09-17 DIAGNOSIS — E66812 Obesity, class 2: Secondary | ICD-10-CM

## 2023-09-17 DIAGNOSIS — E119 Type 2 diabetes mellitus without complications: Secondary | ICD-10-CM | POA: Diagnosis not present

## 2023-09-17 DIAGNOSIS — Z7984 Long term (current) use of oral hypoglycemic drugs: Secondary | ICD-10-CM | POA: Diagnosis not present

## 2023-09-17 DIAGNOSIS — G4730 Sleep apnea, unspecified: Secondary | ICD-10-CM | POA: Diagnosis not present

## 2023-09-17 DIAGNOSIS — Z6836 Body mass index (BMI) 36.0-36.9, adult: Secondary | ICD-10-CM

## 2023-09-17 DIAGNOSIS — R948 Abnormal results of function studies of other organs and systems: Secondary | ICD-10-CM

## 2023-09-17 NOTE — Progress Notes (Signed)
 Office: [phone]  /  Fax: [phone]  WEIGHT SUMMARY AND BIOMETRICS  Starting Date: 08/05/23  Starting Weight: 242lb   Weight Lost Since Last Visit: 5lb   Vitals Temp: 98.1 F (36.7 C) BP: 120/82 Pulse Rate: 77 SpO2: 100 %   Body Composition  Body Fat %: 41.8 % Fat Mass (lbs): 99.2 lbs Muscle Mass (lbs): 131 lbs Total Body Water (lbs): 86.8 lbs Visceral Fat Rating : 11     HPI  Chief Complaint: OBESITY  Robin Fox is here to discuss her progress with her obesity treatment plan. She is on the the Category 1 Plan and states she is following her eating plan approximately 90 % of the time. She states she is exercising walking 15 minutes 2 or 3 times per week.  Interval History:  Since last office visit she is down 5 lb This gives her a net weight loss of 5 lb in 1 month of medically supervised weight management She hasn't started Lomaira  due to recent colonoscopy She is logging her intake and hitting goals most days She lacks a good support system at home She had a death in the family She denies hunger or cravings She hasn't been workout out [facility] She plans to work on meal planning  Pharmacotherapy: metformin  XR 500 mg daily  PHYSICAL EXAM:  Blood pressure 120/82, pulse 77, temperature 98.1 F (36.7 C), height 5\' 8"  (1.727 m), weight 237 lb (107.5 kg), SpO2 100%. Body mass index is 36.04 kg/m.  General: She is overweight, cooperative, alert, well developed, and in no acute distress. PSYCH: Has normal mood, affect and thought process.   Lungs: Normal breathing effort, no conversational dyspnea.   ASSESSMENT AND PLAN  TREATMENT PLAN FOR OBESITY:  Recommended Dietary Goals  Robin Fox is currently in the action stage of change. As such, her goal is to continue weight management plan. She has agreed to the Category 2 Plan.  Behavioral Intervention  We discussed the following Behavioral Modification Strategies today: increasing lean  protein intake to established goals, increasing fiber rich foods, avoiding skipping meals, work on meal planning and preparation, work on Counselling psychologist calories using tracking application, keeping healthy foods at home, work on managing stress, creating time for self-care and relaxation, avoiding temptations and identifying enticing environmental cues, continue to practice mindfulness when eating, and continue to work on maintaining a reduced calorie state, getting the recommended amount of protein, incorporating whole foods, making healthy choices, staying well hydrated and practicing mindfulness when eating..  Additional resources provided today: NA  Recommended Physical Activity Goals  Robin Fox has been advised to work up to 150 minutes of moderate intensity aerobic activity a week and strengthening exercises 2-3 times per week for cardiovascular health, weight loss maintenance and preservation of muscle mass.   She has agreed to Think about enjoyable ways to increase daily physical activity and overcoming barriers to exercise and Increase physical activity in their day and reduce sedentary time (increase NEAT). Reviewed exercise change goals   Pharmacotherapy changes for the treatment of obesity: d/c RX for Lomaira  (never started)  ASSOCIATED CONDITIONS ADDRESSED TODAY  Type 2 diabetes mellitus without complication, without long-term current use of insulin  (HCC) Lab Results  Component Value Date   HGBA1C 6.7 (H) 07/22/2023  She has had some loose stools from metformin  XR 500 mg daily, started by PCP She has moved it to every other day of the week Hx of pancreatitis, unable to take GLP-1 RA Working  on prescribed dietary plan with room for improvement with regular exercise She is not checking blood sugars Energy level has improved cutting back on sugar intake  F/u with PCP to discuss additional treatment options.  Consider use of an SGLT-2 inhibitor  Class 2 severe obesity due  to excess calories with serious comorbidity and body mass index (BMI) of 36.0 to 36.9 in adult [facility])  Low basal metabolic rate Initial metabolic rate was low on fasting IC.  She is trying to stick to dietary logging with 1000 to 1200 cal/day.  This should include 90+ grams of protein intake daily.  She has done a better job of eating on a schedule and incorporating more lean protein with meals still has room for improvement with consistency.  She has been working on improving sleep and physical activity also with room for improvement.  Continue to work on these listed changes to improve basal metabolic rate.  Repeat fasting IC in late summer  Sleep-disordered breathing She plans to hold off on polysomnogram due to timing of other health issues. Continue active plan for weight reduction and work on improving quality of sleep with a goal of 7 to 8 hours at night.  Notably, untreated OSA may be contributing to her low metabolic rate and low energy levels.     She was informed of the importance of frequent follow up visits to maximize her success with intensive lifestyle modifications for her multiple health conditions.   ATTESTASTION STATEMENTS:  Reviewed by clinician on day of visit: allergies, medications, problem list, medical history, surgical history, family history, social history, and previous encounter notes pertinent to obesity diagnosis.   I have personally spent 23 minutes total time today in preparation, patient care, nutritional counseling and education,  and documentation for this visit, including the following: review of most recent clinical lab tests, reviewing medical assistant documentation, review and interpretation of bioimpedence results.     [name], D.O. DABFM, DABOM [facility] [address] [place], [place] [address] [phone]

## 2023-09-27 ENCOUNTER — Emergency Department

## 2023-09-27 ENCOUNTER — Emergency Department
Admission: EM | Admit: 2023-09-27 | Discharge: 2023-09-27 | Disposition: A | Discharge status: Home / Self Care | Attending: Emergency Medicine | Admitting: Emergency Medicine

## 2023-09-27 DIAGNOSIS — E119 Type 2 diabetes mellitus without complications: Secondary | ICD-10-CM | POA: Insufficient documentation

## 2023-09-27 DIAGNOSIS — M25572 Pain in left ankle and joints of left foot: Secondary | ICD-10-CM | POA: Diagnosis not present

## 2023-09-27 DIAGNOSIS — S93402A Sprain of unspecified ligament of left ankle, initial encounter: Secondary | ICD-10-CM | POA: Diagnosis not present

## 2023-09-27 DIAGNOSIS — I10 Essential (primary) hypertension: Secondary | ICD-10-CM | POA: Insufficient documentation

## 2023-09-27 DIAGNOSIS — X58XXXA Exposure to other specified factors, initial encounter: Secondary | ICD-10-CM | POA: Diagnosis not present

## 2023-09-27 MED ORDER — NAPROXEN 500 MG PO TABS
500.0000 mg | ORAL_TABLET | Freq: Once | ORAL | Status: AC
  Administered 2023-09-27: 500 mg via ORAL
  Filled 2023-09-27: qty 1

## 2023-09-27 MED ORDER — OXYCODONE HCL 5 MG PO TABS: 5.0000 mg | ORAL_TABLET | Freq: Four times a day (QID) | ORAL | 0 refills | Status: AC | PRN

## 2023-09-27 MED ORDER — ACETAMINOPHEN 500 MG PO TABS
1000.0000 mg | ORAL_TABLET | Freq: Once | ORAL | Status: AC
  Administered 2023-09-27: 1000 mg via ORAL
  Filled 2023-09-27: qty 2

## 2023-09-27 NOTE — ED Provider Notes (Signed)
 [facility] Provider Note    Event Date/Time   First MD Initiated Contact with Patient 09/27/23 1600     (approximate)   History   Ankle Pain   HPI  Robin Fox is a 48 y.o. female with a past history of hypertension diabetes pancreatitis who comes ED complaining of left inner ankle pain that was present when she woke up this morning.  Denies any trip/fall or other trauma.  No prior serious injuries such as fractures or surgery on that ankle.  Pain is worse with movement and weightbearing.  Radiates halfway up the lower calf.  No leg swelling fever chest pain shortness of breath.     Physical Exam   Triage Vital Signs: ED Triage Vitals [09/27/23 1317]  Encounter Vitals Group     BP (!) 138/97     Systolic BP Percentile      Diastolic BP Percentile      Pulse Rate 88     Resp 18     Temp 97.9 F (36.6 C)     Temp Source Oral     SpO2 98 %     Weight      Height      Head Circumference      Peak Flow      Pain Score 9     Pain Loc      Pain Education      Exclude from Growth Chart     Most recent vital signs: Vitals:   09/27/23 1317 09/27/23 1625  BP: (!) 138/97 (!) 147/93  Pulse: 88 68  Resp: 18 16  Temp: 97.9 F (36.6 C)   SpO2: 98% 99%    General: Awake, no distress.  CV:  Good peripheral perfusion.  Normal distal pulses, regular rate Resp:  Normal effort.  Abd:  No distention.  Other:  Mild swelling and tenderness at the posterior and superior aspect of the left medial malleolus.  EHL intact, mildly tender along the course of tibialis anterior above the ankle.  No joint effusion or joint line tenderness.  Intact range of motion of the ankle joint.  No wounds..   ED Results / Procedures / Treatments   Labs (all labs ordered are listed, but only abnormal results are displayed) Labs Reviewed - No data to display   RADIOLOGY X-ray left ankle interpreted by me, negative for fracture.  Radiology report  reviewed   PROCEDURES:  Procedures   MEDICATIONS ORDERED IN ED: Medications  acetaminophen  (TYLENOL ) tablet 1,000 mg (has no administration in time range)  naproxen  (NAPROSYN ) tablet 500 mg (has no administration in time range)     IMPRESSION / MDM / ASSESSMENT AND PLAN / ED COURSE  I reviewed the triage vital signs and the nursing notes.                              Differential diagnosis includes, but is not limited to, ankle sprain, muscle strain, fracture, contusion  Patient presents with atraumatic left ankle pain.  No signs of infection, no open wounds.  X-rays are unremarkable.  She has reproducible tenderness along the tibialis anterior as it traverses the upper ankle, suspect some degree of strain or overuse.     FINAL CLINICAL IMPRESSION(S) / ED DIAGNOSES   Final diagnoses:  Sprain of left ankle, unspecified ligament, initial encounter     Rx / DC Orders   ED Discharge Orders  Ordered    oxyCODONE  (ROXICODONE ) 5 MG immediate release tablet  Every 6 hours PRN        09/27/23 1643             Note:  This document was prepared using Dragon voice recognition software and may include unintentional dictation errors.   Jacquie Maudlin, MD 09/27/23 [phone]

## 2023-09-27 NOTE — Discharge Instructions (Signed)
 Tylenol  1000mg  every 8 hours Ibuprofen  600mg  every 6 hours Oxycodone  as needed for additional pain relief.

## 2023-09-27 NOTE — ED Triage Notes (Signed)
 Pt to ED from home with left ankle pain since Thursday. Pt denies recent fall and states she is unable to put any weight on the extremity.

## 2023-10-15 ENCOUNTER — Encounter: Payer: Self-pay | Admitting: Family Medicine

## 2023-10-15 ENCOUNTER — Ambulatory Visit: Admitting: Family Medicine

## 2023-10-15 VITALS — BP 134/91 | HR 88 | Temp 98.0°F | Ht 68.0 in | Wt 235.0 lb

## 2023-10-15 DIAGNOSIS — R7989 Other specified abnormal findings of blood chemistry: Secondary | ICD-10-CM | POA: Diagnosis not present

## 2023-10-15 DIAGNOSIS — Z7984 Long term (current) use of oral hypoglycemic drugs: Secondary | ICD-10-CM

## 2023-10-15 DIAGNOSIS — E66812 Obesity, class 2: Secondary | ICD-10-CM | POA: Diagnosis not present

## 2023-10-15 DIAGNOSIS — E119 Type 2 diabetes mellitus without complications: Secondary | ICD-10-CM

## 2023-10-15 DIAGNOSIS — Z6835 Body mass index (BMI) 35.0-35.9, adult: Secondary | ICD-10-CM | POA: Diagnosis not present

## 2023-10-15 DIAGNOSIS — R948 Abnormal results of function studies of other organs and systems: Secondary | ICD-10-CM | POA: Diagnosis not present

## 2023-10-15 NOTE — Patient Instructions (Addendum)
 1000-1200 cal/ day using  This should include 90-110 g of protein daily  Hydrate well with water -- aim for 100 oz per day  Ramp up workout time to 30 min 4-5 days / wk

## 2023-10-15 NOTE — Progress Notes (Signed)
 Office: [phone]  /  Fax: [phone]  WEIGHT SUMMARY AND BIOMETRICS  Starting Date: 08/05/23  Starting Weight: 242lb   Weight Lost Since Last Visit: 2lb   Vitals Temp: 98 F (36.7 C) BP: (!) 134/91 Pulse Rate: 88 SpO2: 96 %   Body Composition  Body Fat %: 41.2 % Fat Mass (lbs): 97 lbs Muscle Mass (lbs): 131.4 lbs Total Body Water (lbs): 87 lbs Visceral Fat Rating : 10   HPI  Chief Complaint: OBESITY  Robin Fox is here to discuss her progress with her obesity treatment plan. She is on the the Category 1 Plan and states she is following her eating plan approximately 80 % of the time. She states she is exercising 30 minutes 2 times per week.  Interval History:  Since last office visit she is down 2 lb She is up 0.4 lb of muscle mass and down 2.2 lb of body fat in the past month This gives her a net weight loss of 7 lb in the past 2 mos of medically supervised weight management She had a family death and had to travel to [place] She stayed mindful of food choices She has stress taking care of her mom She has walking more outside with her sons She is taking finals for school  Pharmacotherapy: none  PHYSICAL EXAM:  Blood pressure (!) 134/91, pulse 88, temperature 98 F (36.7 C), height 5\' 8"  (1.727 m), weight 235 lb (106.6 kg), SpO2 96%. Body mass index is 35.73 kg/m.  General: She is overweight, cooperative, alert, well developed, and in no acute distress. PSYCH: Has normal mood, affect and thought process.   Lungs: Normal breathing effort, no conversational dyspnea.   ASSESSMENT AND PLAN  TREATMENT PLAN FOR OBESITY:  Recommended Dietary Goals  Robin Fox is currently in the action stage of change. As such, her goal is to continue weight management plan. She has agreed to the Category 2 Plan and keeping a food journal and adhering to recommended goals of 1000-1200 calories and 90 g of  protein.  Behavioral Intervention  We discussed the following  Behavioral Modification Strategies today: increasing lean protein intake to established goals, increasing fiber rich foods, increasing water intake , work on tracking and journaling calories using tracking application, reading food labels , keeping healthy foods at home, identifying sources and decreasing liquid calories, decreasing eating out or consumption of processed foods, and making healthy choices when eating convenient foods, planning for success, and continue to work on maintaining a reduced calorie state, getting the recommended amount of protein, incorporating whole foods, making healthy choices, staying well hydrated and practicing mindfulness when eating..  Additional resources provided today: NA  Recommended Physical Activity Goals  Robin Fox has been advised to work up to 150 minutes of moderate intensity aerobic activity a week and strengthening exercises 2-3 times per week for cardiovascular health, weight loss maintenance and preservation of muscle mass.   She has agreed to Think about enjoyable ways to increase daily physical activity and overcoming barriers to exercise and Increase physical activity in their day and reduce sedentary time (increase NEAT).  Pharmacotherapy changes for the treatment of obesity: none  ASSOCIATED CONDITIONS ADDRESSED TODAY  Type 2 diabetes mellitus without complication, without long-term current use of insulin  (HCC) Lab Results  Component Value Date   HGBA1C 6.7 (H) 07/22/2023  Doing well on metformin  XR 500 mg daily with food per PCP Not checking blood sugars at home Actively working on reducing intake of starches and sugar Has room  to ramp up exercise time  Class 2 obesity due to excess calories with body mass index (BMI) of 35.0 to 35.9 in adult, unspecified whether serious comorbidity present  Low basal metabolic rate Plan to repeat fasting IC in 2-3 mos Keep daily kcal 1000-1200 per day to include 90-110 g of protein daily Continue to  work on improving sleep and regular physical activity  Low vitamin D  level Last vitamin D  Lab Results  Component Value Date   VD25OH 32.0 08/05/2023   Recommend taking OTC vitamin D  2,000 international units  daily in addition to a MVI daily     She was informed of the importance of frequent follow up visits to maximize her success with intensive lifestyle modifications for her multiple health conditions.   ATTESTASTION STATEMENTS:  Reviewed by clinician on day of visit: allergies, medications, problem list, medical history, surgical history, family history, social history, and previous encounter notes pertinent to obesity diagnosis.      [name], D.O. DABFM, DABOM [facility] [address] [place], [place] [address] [phone]

## 2023-10-21 ENCOUNTER — Encounter: Admitting: Pediatrics

## 2023-11-11 ENCOUNTER — Other Ambulatory Visit: Payer: Self-pay | Admitting: *Deleted

## 2023-11-11 DIAGNOSIS — E119 Type 2 diabetes mellitus without complications: Secondary | ICD-10-CM

## 2023-11-11 DIAGNOSIS — E7800 Pure hypercholesterolemia, unspecified: Secondary | ICD-10-CM

## 2023-11-11 DIAGNOSIS — I10 Essential (primary) hypertension: Secondary | ICD-10-CM

## 2023-11-13 ENCOUNTER — Other Ambulatory Visit

## 2023-11-13 DIAGNOSIS — E119 Type 2 diabetes mellitus without complications: Secondary | ICD-10-CM

## 2023-11-13 DIAGNOSIS — I10 Essential (primary) hypertension: Secondary | ICD-10-CM

## 2023-11-13 DIAGNOSIS — E7800 Pure hypercholesterolemia, unspecified: Secondary | ICD-10-CM

## 2023-11-14 ENCOUNTER — Ambulatory Visit: Payer: Self-pay | Admitting: Family Medicine

## 2023-11-14 LAB — HEMOGLOBIN A1C
Est. average glucose Bld gHb Est-mCnc: 131 mg/dL
Hgb A1c MFr Bld: 6.2 % — ABNORMAL HIGH (ref 4.8–5.6)

## 2023-11-14 LAB — LIPID PANEL
Chol/HDL Ratio: 3.9 ratio (ref 0.0–4.4)
Cholesterol, Total: 180 mg/dL (ref 100–199)
HDL: 46 mg/dL (ref >39)
LDL Chol Calc (NIH): 104 mg/dL — ABNORMAL HIGH (ref 0–99)
Triglycerides: 175 mg/dL — ABNORMAL HIGH (ref 0–149)
VLDL Cholesterol Cal: 30 mg/dL (ref 5–40)

## 2023-11-17 ENCOUNTER — Ambulatory Visit: Admitting: Family Medicine

## 2023-11-17 ENCOUNTER — Encounter: Payer: Self-pay | Admitting: Family Medicine

## 2023-11-17 VITALS — BP 120/80 | HR 75 | Temp 98.2°F | Ht 68.0 in | Wt 230.0 lb

## 2023-11-17 DIAGNOSIS — Z6834 Body mass index (BMI) 34.0-34.9, adult: Secondary | ICD-10-CM | POA: Diagnosis not present

## 2023-11-17 DIAGNOSIS — E66811 Obesity, class 1: Secondary | ICD-10-CM | POA: Diagnosis not present

## 2023-11-17 DIAGNOSIS — E119 Type 2 diabetes mellitus without complications: Secondary | ICD-10-CM | POA: Diagnosis not present

## 2023-11-17 DIAGNOSIS — F17200 Nicotine dependence, unspecified, uncomplicated: Secondary | ICD-10-CM

## 2023-11-17 DIAGNOSIS — R948 Abnormal results of function studies of other organs and systems: Secondary | ICD-10-CM | POA: Diagnosis not present

## 2023-11-17 DIAGNOSIS — Z7984 Long term (current) use of oral hypoglycemic drugs: Secondary | ICD-10-CM

## 2023-11-17 DIAGNOSIS — I10 Essential (primary) hypertension: Secondary | ICD-10-CM

## 2023-11-17 DIAGNOSIS — E6609 Other obesity due to excess calories: Secondary | ICD-10-CM

## 2023-11-17 MED ORDER — METFORMIN HCL ER 750 MG PO TB24
750.0000 mg | ORAL_TABLET | Freq: Every day | ORAL | 0 refills | Status: DC
Start: 2023-11-17 — End: 2023-12-22

## 2023-11-17 NOTE — Patient Instructions (Addendum)
 Keep calorie intake at 1200 cal/ day This should include 90-120 g / day  Increase Metformin  XR  to 750 mg / day with food  Keep up with exercise! Aim for 4 days/ wk  Let's plan to recheck kidney function in August

## 2023-11-17 NOTE — Progress Notes (Signed)
 Office: [phone]  /  Fax: [phone]  WEIGHT SUMMARY AND BIOMETRICS  Starting Date: 08/05/23  Starting Weight: 242lb   Weight Lost Since Last Visit: 5lb   Vitals Temp: 98.2 F (36.8 C) BP: 120/80 Pulse Rate: 75 SpO2: 99 %   Body Composition  Body Fat %: 39.7 % Fat Mass (lbs): 91.4 lbs Muscle Mass (lbs): 132 lbs Total Body Water (lbs): 83.4 lbs Visceral Fat Rating : 10    HPI  Chief Complaint: OBESITY  Robin Fox is here to discuss her progress with her obesity treatment plan. She is on the the Category 1 Plan and states she is following her eating plan approximately 80 % of the time. She states she is working out doing cardio and weights for 30-45 minutes 3-4 times per week.  Interval History:  Since last office visit she is down 5 lb She is up 0.6 pounds of muscle mass and down 5.6 pounds of body fat in the past month This gives her a net weight loss of 12 pounds in the past 3 months medically supervised weight management This is a 4.9% total body weight loss She has been working on mindful eating, meal planning and better food choices She has been more consistent with working out doing cardio and resistance training 3 days a week Stress levels remain high, caretaking for her mom and may need placement in the near future She is still actively working on smoking cessation, reducing her number of cigarettes smoked daily  Pharmacotherapy: Metformin  XR 500 mg once daily for type 2 diabetes (History of pancreatitis)  PHYSICAL EXAM:  Blood pressure 120/80, pulse 75, temperature 98.2 F (36.8 C), height 5' 8 (1.727 m), weight 230 lb (104.3 kg), SpO2 99%. Body mass index is 34.97 kg/m.  General: She is overweight, cooperative, alert, well developed, and in no acute distress. PSYCH: Has normal mood, affect and thought process.   Lungs: Normal breathing effort, no conversational dyspnea.   ASSESSMENT AND PLAN  TREATMENT PLAN FOR OBESITY:  Recommended  Dietary Goals  Robin Fox is currently in the action stage of change. As such, her goal is to continue weight management plan. She has agreed to keeping a food journal and adhering to recommended goals of 1200 calories and 90 to 120 g of protein and following a lower carbohydrate, vegetable and lean protein rich diet plan.  Behavioral Intervention  We discussed the following Behavioral Modification Strategies today: increasing lean protein intake to established goals, increasing fiber rich foods, increasing water intake , work on meal planning and preparation, work on Counselling psychologist calories using tracking application, keeping healthy foods at home, work on managing stress, creating time for self-care and relaxation, planning for success, and continue to work on maintaining a reduced calorie state, getting the recommended amount of protein, incorporating whole foods, making healthy choices, staying well hydrated and practicing mindfulness when eating..  Additional resources provided today: NA  Recommended Physical Activity Goals  Robin Fox has been advised to work up to 150 minutes of moderate intensity aerobic activity a week and strengthening exercises 2-3 times per week for cardiovascular health, weight loss maintenance and preservation of muscle mass.   She has agreed to Increase the intensity, frequency or duration of strengthening exercises  and Increase the intensity, frequency or duration of aerobic exercises    Pharmacotherapy changes for the treatment of obesity: Increase metformin  XR to 750 mg once daily with food  ASSOCIATED CONDITIONS ADDRESSED TODAY  Type 2 diabetes mellitus without complication, without  long-term current use of insulin  (HCC) Lab Results  Component Value Date   HGBA1C 6.2 (H) 11/13/2023  She is doing well with dietary change, regular exercise and weight reduction.  She has tolerated metformin  XR 500 mg once daily well and has significant reduction of A1c from  6.7 down to 6.2, recently checked by her PCP.  Will increase metformin  XR to 750 mg once daily to enhance health and insulin  resistance and additional weight reduction benefit.  With her history of pancreatitis, she is not a good candidate for use of a GLP-1 receptor agonist.  -     metFORMIN  HCl ER; Take 1 tablet (750 mg total) by mouth daily with breakfast.  Dispense: 90 tablet; Refill: 0  Class 1 obesity due to excess calories with body mass index (BMI) of 34.0 to 34.9 in adult, unspecified whether serious comorbidity present  Essential hypertension Blood pressure is improving.  She is on HCTZ 25 mg once daily and tolerating well.  Continue active plan for weight reduction  Low basal metabolic rate Initial fasting IC was low at 835 cal/day at rest daily.  She is actively working on improving her protein intake with meals, improving sleep at night and regular exercise including both cardio and resistance training.  She is giving her calories around 1200/day and seeing weight reduction.  Plan to repeat fasting IC in 2 months  Smoker Counseled on her readiness for smoking cessation.  She is in the contemplative phase of behavior change.  She has some fear of weight gain so we discussed actively working on smoking cessation while she is actively working on weight loss to negate the weight gain normally seen.  She continues to reduce her number of cigarettes smokes daily and has a good support system.     She was informed of the importance of frequent follow up visits to maximize her success with intensive lifestyle modifications for her multiple health conditions.   ATTESTASTION STATEMENTS:  Reviewed by clinician on day of visit: allergies, medications, problem list, medical history, surgical history, family history, social history, and previous encounter notes pertinent to obesity diagnosis.   I have personally spent 30 minutes total time today in preparation, patient care, nutritional  counseling and education,  and documentation for this visit, including the following: review of most recent clinical lab tests, prescribing medications/ refilling medications, reviewing medical assistant documentation, review and interpretation of bioimpedence results.     [name], D.O. DABFM, DABOM [facility] [address] [place], [place] [address] [phone]

## 2023-11-19 ENCOUNTER — Ambulatory Visit: Admitting: Family Medicine

## 2023-11-27 ENCOUNTER — Encounter: Payer: Self-pay | Admitting: Family Medicine

## 2023-11-27 ENCOUNTER — Ambulatory Visit: Admitting: Family Medicine

## 2023-11-27 VITALS — BP 106/73 | HR 72 | Ht 68.0 in | Wt 235.6 lb

## 2023-11-27 DIAGNOSIS — E66812 Obesity, class 2: Secondary | ICD-10-CM

## 2023-11-27 DIAGNOSIS — E1169 Type 2 diabetes mellitus with other specified complication: Secondary | ICD-10-CM | POA: Diagnosis not present

## 2023-11-27 DIAGNOSIS — E119 Type 2 diabetes mellitus without complications: Secondary | ICD-10-CM

## 2023-11-27 DIAGNOSIS — E785 Hyperlipidemia, unspecified: Secondary | ICD-10-CM

## 2023-11-27 DIAGNOSIS — Z6837 Body mass index (BMI) 37.0-37.9, adult: Secondary | ICD-10-CM | POA: Diagnosis not present

## 2023-11-27 DIAGNOSIS — M25472 Effusion, left ankle: Secondary | ICD-10-CM | POA: Diagnosis not present

## 2023-11-27 MED ORDER — ATORVASTATIN CALCIUM 20 MG PO TABS: ORAL_TABLET | ORAL | 3 refills | Status: AC

## 2023-11-27 NOTE — Assessment & Plan Note (Signed)
 Ankle swelling with history of acute episode one month ago. Reports fluid sensation persisting despite ER evaluation showing no abnormalities. Physical exam confirms visible swelling. Considering gout vs soft tissue injury from being on feet frequently with caregiving responsibilities. - Check uric acid level to evaluate for gout - Discussed potential gout triggers including diet and uric acid metabolism - Advised supportive footwear with ankle and arch support - If gout confirmed, treatment options include NSAIDs or steroids for flares

## 2023-11-27 NOTE — Progress Notes (Signed)
 Established Patient Office Visit  Subjective   Patient ID: Robin Fox, female    DOB: 08-26-1975  Age: 48 y.o. MRN: [id number]  Chief Complaint  Patient presents with   Hyperlipidemia   Diabetes    HPI  Subjective - Ankle swelling and pain approximately one month ago - Went to emergency room, told nothing wrong but reports persistent fluid sensation in ankle area - Big toe pain, minimal current ankle pain - No injury or fall recalled - Symptoms resolved approximately one week after ER visit - ER recommended ibuprofen  only  - Chest pain with daily rosuvastatin , tolerates every other day dosing - Cholesterol noted to be elevated on recent labs - Participating in healthy weight and wellness program - Exercising and changing eating habits - Missing junk food but adapting - Weight loss ongoing, home scale 230 lbs vs office scale 235 lbs  Medications: rosuvastatin  (chest pain with daily dosing, taking every other day), ibuprofen  as needed for ankle  PMHx: diabetes, elevated cholesterol  ROS: denies ankle discoloration, denies significant current ankle pain, reports chest pain with daily statin use   The 10-year ASCVD risk score (Arnett DK, et al., 2019) is: 6.7%  Health Maintenance Due  Topic Date Due   COVID-19 Vaccine (1) Never done   Pneumococcal Vaccine 58-1 Years old (1 of 2 - PCV) Never done   Hepatitis B Vaccines (1 of 3 - 19+ 3-dose series) Never done   DTaP/Tdap/Td (2 - Td or Tdap) 04/22/2022      Objective:     BP 106/73   Pulse 72   Ht 5' 8 (1.727 m)   Wt 235 lb 9 oz (106.9 kg)   SpO2 99%   BMI 35.82 kg/m    Physical Exam Gen: alert, oriented Cv: rrr Pulm: lctab Ext: mild left lateral mallelous swelling and tenderness.    No results found for any visits on 11/27/23.      Assessment & Plan:   Left ankle swelling Assessment & Plan: Ankle swelling with history of acute episode one month ago. Reports fluid sensation persisting  despite ER evaluation showing no abnormalities. Physical exam confirms visible swelling. Considering gout vs soft tissue injury from being on feet frequently with caregiving responsibilities. - Check uric acid level to evaluate for gout - Discussed potential gout triggers including diet and uric acid metabolism - Advised supportive footwear with ankle and arch support - If gout confirmed, treatment options include NSAIDs or steroids for flares  Orders: -     Uric acid  Hyperlipidemia associated with type 2 diabetes mellitus (HCC) Assessment & Plan: Chest pain with daily rosuvastatin , currently taking every other day. Cholesterol remains elevated and requires treatment given diabetes and age over 42. - Discontinue rosuvastatin  for one week - Start atorvastatin  20mg , begin with half tablet daily for two weeks then increase to full tablet daily - Continue weight loss efforts through healthy weight and wellness program - Discussed diabetes management and statin necessity regardless of A1C improvement   Type 2 diabetes mellitus without complication, without long-term current use of insulin  (HCC) Assessment & Plan: At goal.  Continue metformin .    Class 2 severe obesity due to excess calories with serious comorbidity and body mass index (BMI) of 37.0 to 37.9 in adult [facility]) Assessment & Plan: Has had successful weight loss with healthy weight and wellness. Continue mgmt per [facility]   Other orders -     Atorvastatin  Calcium ; Take 1/2 tab daily for the first two  weeks then increase to 1 tablet daily  Dispense: 90 tablet; Refill: 3     Return in about 3 months (around 02/27/2024) for DM, hld.    Toribio MARLA Slain, MD

## 2023-11-27 NOTE — Assessment & Plan Note (Addendum)
 Has had successful weight loss with healthy weight and wellness. Continue mgmt per [facility]

## 2023-11-27 NOTE — Assessment & Plan Note (Signed)
At goal  Continue metformin.

## 2023-11-27 NOTE — Assessment & Plan Note (Signed)
 Chest pain with daily rosuvastatin , currently taking every other day. Cholesterol remains elevated and requires treatment given diabetes and age over 58. - Discontinue rosuvastatin  for one week - Start atorvastatin 20mg , begin with half tablet daily for two weeks then increase to full tablet daily - Continue weight loss efforts through healthy weight and wellness program - Discussed diabetes management and statin necessity regardless of A1C improvement

## 2023-11-27 NOTE — Patient Instructions (Signed)
 It was nice to see you today,  We addressed the following topics today: -I am switching your Crestor  to atorvastatin. - Stop taking your Crestor  for 1 week then start your atorvastatin at 1/2 tablet/day for the first 2 weeks then increase to a full tablet per day. - I am checking you for elevated uric acid level to check for gout.  This may be causing your ankle swelling.  If it is we can discuss treatment options further..   Have a great day,  Rolan Slain, MD

## 2023-11-28 LAB — URIC ACID: Uric Acid: 5.3 mg/dL (ref 2.6–6.2)

## 2023-12-01 ENCOUNTER — Ambulatory Visit: Payer: Self-pay | Admitting: Family Medicine

## 2023-12-17 ENCOUNTER — Telehealth: Payer: Self-pay | Admitting: Obstetrics & Gynecology

## 2023-12-17 NOTE — Telephone Encounter (Signed)
 Patient is requesting an Ultra Sound, state she was suppose to get it last September but the machine was down, she want to know if she still need it

## 2023-12-18 NOTE — Telephone Encounter (Signed)
 Called and spoke with patient. She would like to schedule the US  that was scheduled last year. She prefers mornings.   Called Radiology to schedule. Scheduled for 8 am on 7/29 at [facility]. Arrive at 7:45 with full bladder. Patient informed and voiced understanding.

## 2023-12-22 ENCOUNTER — Ambulatory Visit: Admitting: Family Medicine

## 2023-12-22 ENCOUNTER — Encounter: Payer: Self-pay | Admitting: Family Medicine

## 2023-12-22 VITALS — BP 134/93 | HR 81 | Temp 97.6°F | Ht 68.0 in | Wt 227.0 lb

## 2023-12-22 DIAGNOSIS — Z7984 Long term (current) use of oral hypoglycemic drugs: Secondary | ICD-10-CM | POA: Diagnosis not present

## 2023-12-22 DIAGNOSIS — F17200 Nicotine dependence, unspecified, uncomplicated: Secondary | ICD-10-CM

## 2023-12-22 DIAGNOSIS — R948 Abnormal results of function studies of other organs and systems: Secondary | ICD-10-CM

## 2023-12-22 DIAGNOSIS — Z6834 Body mass index (BMI) 34.0-34.9, adult: Secondary | ICD-10-CM

## 2023-12-22 DIAGNOSIS — E119 Type 2 diabetes mellitus without complications: Secondary | ICD-10-CM | POA: Diagnosis not present

## 2023-12-22 DIAGNOSIS — E66811 Obesity, class 1: Secondary | ICD-10-CM

## 2023-12-22 DIAGNOSIS — I10 Essential (primary) hypertension: Secondary | ICD-10-CM | POA: Diagnosis not present

## 2023-12-22 MED ORDER — BUPROPION HCL ER (XL) 150 MG PO TB24: 150.0000 mg | ORAL_TABLET | Freq: Every day | ORAL | 0 refills | Status: DC

## 2023-12-22 MED ORDER — METFORMIN HCL ER 750 MG PO TB24: 750.0000 mg | ORAL_TABLET | Freq: Every day | ORAL | 0 refills | Status: AC

## 2023-12-22 NOTE — Progress Notes (Signed)
 Office: [phone]  /  Fax: [phone]  WEIGHT SUMMARY AND BIOMETRICS  Starting Date: 08/05/23  Starting Weight: 242lb   Weight Lost Since Last Visit: 3lb   Vitals Temp: 97.6 F (36.4 C) BP: (!) 134/93 Pulse Rate: 81 SpO2: 98 %   Body Composition  Body Fat %: 39.6 % Fat Mass (lbs): 90 lbs Muscle Mass (lbs): 130.4 lbs Total Body Water (lbs): 84.8 lbs Visceral Fat Rating : 10    HPI  Chief Complaint: OBESITY  Robin Fox is here to discuss her progress with her obesity treatment plan. She is on the the Category 1 Plan and states she is following her eating plan approximately 75 % of the time. She states she is doing cardio and strength training 60 minutes 5 times per week.  Interval History:  Since last office visit she is down 3 lb This gives her a net weight loss of 15 lb in the past 4 mos of medically supervised weight management This is a 6% TBW loss She is down 1.6 lb of muscle mass and down 1.4 lb of body fat since last visit She is using a workout app and added in both cardio and weight training from home for an hour 5 days/ wk She is doing well with change to cat 2 meal plan Hunger and cravings are better She did move up Metformin  XR to 750 mg daily with some loose stools She did cut out high sugar smoothies  Pharmacotherapy: metformin  XR 750 mg daily with food  PHYSICAL EXAM:  Blood pressure (!) 134/93, pulse 81, temperature 97.6 F (36.4 C), height 5' 8 (1.727 m), weight 227 lb (103 kg), SpO2 98%. Body mass index is 34.52 kg/m.  General: She is overweight, cooperative, alert, well developed, and in no acute distress. PSYCH: Has normal mood, affect and thought process.   Lungs: Normal breathing effort, no conversational dyspnea.   ASSESSMENT AND PLAN  TREATMENT PLAN FOR OBESITY:  Recommended Dietary Goals  Robin Fox is currently in the action stage of change. As such, her goal is to continue weight management plan. She has agreed to the  Category 2 Plan.  Behavioral Intervention  We discussed the following Behavioral Modification Strategies today: increasing lean protein intake to established goals, increasing fiber rich foods, avoiding skipping meals, increasing water intake , work on meal planning and preparation, keeping healthy foods at home, identifying sources and decreasing liquid calories, decreasing eating out or consumption of processed foods, and making healthy choices when eating convenient foods, continue to practice mindfulness when eating, and continue to work on maintaining a reduced calorie state, getting the recommended amount of protein, incorporating whole foods, making healthy choices, staying well hydrated and practicing mindfulness when eating..  Additional resources provided today: NA  Recommended Physical Activity Goals  Robin Fox has been advised to work up to 150 minutes of moderate intensity aerobic activity a week and strengthening exercises 2-3 times per week for cardiovascular health, weight loss maintenance and preservation of muscle mass.   She has agreed to Work on scheduling and tracking physical activity.   Pharmacotherapy changes for the treatment of obesity: add Wellbutrin  XL 150 mg daily   ASSOCIATED CONDITIONS ADDRESSED TODAY  Smoker She is interested in smoking cessation and has never tried Wellbutrin  She is a good candidate for Wellbutrin  which may also help with food impulse control and emotional eating. Consider additional quit smoking assistance next visit -     buPROPion  HCl ER (XL); Take 1 tablet (150 mg total)  by mouth daily.  Dispense: 30 tablet; Refill: 0  Type 2 diabetes mellitus without complication, without long-term current use of insulin  (HCC) Lab Results  Component Value Date   HGBA1C 6.2 (H) 11/13/2023  She has hx of idiopathic pancreatitis and would be higher risk if adding a GLP-1 RA She agrees to keeping metformin  XR 750 mg daily while continuing on a 1200 kcal  moderately lower carb / higher protein diet Great job adding in regular exercise -     metFORMIN  HCl ER; Take 1 tablet (750 mg total) by mouth daily with breakfast.  Dispense: 90 tablet; Refill: 0  Essential hypertension DBP is higher today at 93.  She did consume a salty meal last night.  Reports good compliance taking hydrochlorothiazide  25 mg daily.    Avoid use of phentermine / Qsymia with elevated Bps  Low basal metabolic rate Initial fasting IC low at 835 cal/ day Doing well with weight loss intaking ~1200 cal/ day Continue to work on getting ~30 g of protein per meal, improving exercise frequency and improving sleep time to 7-8 hrs at night Plan to repeat fasting IC in the next 2-3 mos  Class 1 obesity due to excess calories with body mass index (BMI) of 34.0 to 34.9 in adult, unspecified whether serious comorbidity present      She was informed of the importance of frequent follow up visits to maximize her success with intensive lifestyle modifications for her multiple health conditions.   ATTESTASTION STATEMENTS:  Reviewed by clinician on day of visit: allergies, medications, problem list, medical history, surgical history, family history, social history, and previous encounter notes pertinent to obesity diagnosis.   I have personally spent 30 minutes total time today in preparation, patient care, nutritional counseling and education,  and documentation for this visit, including the following: review of most recent clinical lab tests, prescribing medications/ refilling medications, reviewing medical assistant documentation, review and interpretation of bioimpedence results.     Robin Fox, D.O. DABFM, DABOM [facility] [address] [place], [place] [address] [phone]

## 2023-12-22 NOTE — Patient Instructions (Signed)
 Continue Metformin  XR 750 mg once daily with food  Add in Wellbutrin  XL 150 mg daily (in the morning)  Aim for 80 + oz of water daily  Aim for 7-8 hrs of sleep at night  Great job with workouts!

## 2023-12-23 ENCOUNTER — Ambulatory Visit (HOSPITAL_BASED_OUTPATIENT_CLINIC_OR_DEPARTMENT_OTHER)
Admission: RE | Admit: 2023-12-23 | Discharge: 2023-12-23 | Disposition: A | Discharge status: Home / Self Care | Source: Ambulatory Visit | Attending: Obstetrics & Gynecology | Admitting: Obstetrics & Gynecology

## 2023-12-23 DIAGNOSIS — N939 Abnormal uterine and vaginal bleeding, unspecified: Secondary | ICD-10-CM | POA: Insufficient documentation

## 2023-12-23 DIAGNOSIS — D259 Leiomyoma of uterus, unspecified: Secondary | ICD-10-CM | POA: Diagnosis not present

## 2023-12-29 ENCOUNTER — Ambulatory Visit: Payer: Self-pay | Admitting: Obstetrics & Gynecology

## 2023-12-29 ENCOUNTER — Ambulatory Visit
Admission: EM | Admit: 2023-12-29 | Discharge: 2023-12-29 | Disposition: A | Discharge status: Home / Self Care | Attending: Emergency Medicine | Admitting: Emergency Medicine

## 2023-12-29 ENCOUNTER — Encounter: Payer: Self-pay | Admitting: Family Medicine

## 2023-12-29 DIAGNOSIS — R350 Frequency of micturition: Secondary | ICD-10-CM | POA: Diagnosis not present

## 2023-12-29 DIAGNOSIS — N898 Other specified noninflammatory disorders of vagina: Secondary | ICD-10-CM | POA: Diagnosis not present

## 2023-12-29 DIAGNOSIS — R1030 Lower abdominal pain, unspecified: Secondary | ICD-10-CM | POA: Insufficient documentation

## 2023-12-29 LAB — POCT URINE DIPSTICK
Bilirubin, UA: NEGATIVE
Blood, UA: NEGATIVE
Glucose, UA: NEGATIVE mg/dL
Ketones, POC UA: NEGATIVE mg/dL
Leukocytes, UA: NEGATIVE
Nitrite, UA: NEGATIVE
POC PROTEIN,UA: 100 — AB
Spec Grav, UA: >=1.03 — AB (ref 1.010–1.025)
Urobilinogen, UA: 0.2 U/dL
pH, UA: 5.5 (ref 5.0–8.0)

## 2023-12-29 LAB — POCT URINE PREGNANCY: Preg Test, Ur: NEGATIVE

## 2023-12-29 NOTE — Discharge Instructions (Addendum)
 Your pregnancy test is negative.  Urine does not show signs of infection.  Your vaginal tests are pending.    Follow up with your primary care provider tomorrow.  Go to the emergency department if you have worsening symptoms.

## 2023-12-29 NOTE — ED Provider Notes (Signed)
 Robin Fox    CSN: [id number] Arrival date & time: 12/29/23  1221      History   Chief Complaint Chief Complaint  Patient presents with   Urinary Frequency   Abdominal Pain    HPI Robin Fox is a 48 y.o. female.  Patient presents with 1 week history of urinary frequency, vaginal itching, lower abdominal cramping.  No OTC medication taken today.  She denies vaginal discharge, pelvic pain, dysuria, hematuria, nausea, vomiting, diarrhea, constipation.  She requests STD testing.  The history is provided by the patient and medical records.    Past Medical History:  Diagnosis Date   Anemia    hx    Anxiety    Diabetes (HCC)    Essential hypertension 11/09/2011   Baseline labs, Testing 32/wks, serial growth ultrasound  Protein 96; cmp nml; plat 329  Growth 03/23/12 80%     Gestational diabetes    Neg 2 hr test after preg   Headache(784.0)    Herpes genitalia    2013 - 1st outbreak   Hx of herpes genitalis 11/21/2011   Acyclovir . Needs suppression at 35 weeks.     Hyperlipidemia    Hypertension    2008   Hypertension in pregnancy, essential, antepartum 11/09/2011   Joint pain    Osteoarthritis    Pancreatic disease    Pre-eclampsia in third trimester    Seasonal allergies    Shortness of breath    w exertion   Trichomonas    Twins     Patient Active Problem List   Diagnosis Date Noted   Left ankle swelling 11/27/2023   Hyperlipidemia associated with type 2 diabetes mellitus (HCC) 11/27/2023   Low vitamin D  level 08/20/2023   Sleep-disordered breathing 08/20/2023   Microcytosis 08/20/2023   Low basal metabolic rate 08/05/2023   Perimenopausal 08/05/2023   History of pancreatitis 07/24/2023   Type 2 diabetes mellitus without complication, without long-term current use of insulin  (HCC) 07/24/2023   Reactive airway disease 08/14/2020   Pancreatitis 08/13/2020   Tobacco use 08/01/2016   Obesity 11/21/2011   Hx of herpes genitalis  11/21/2011   Essential hypertension 11/09/2011    Past Surgical History:  Procedure Laterality Date   CESAREAN SECTION     X 2   CESAREAN SECTION  04/21/2012   Procedure: CESAREAN SECTION;  Surgeon: Robin KANDICE Solomons, MD;  Location: [facility];  Service: Obstetrics;  Laterality: N/A;   TOOTH EXTRACTION      OB History     Gravida  3   Para  3   Term  1   Preterm  2   AB  0   Living  4      SAB  0   IAB  0   Ectopic  0   Multiple  2   Live Births  4            Home Medications    Prior to Admission medications   Medication Sig Start Date End Date Taking? Authorizing Provider  acyclovir  (ZOVIRAX ) 400 MG tablet Take 400 mg by mouth 2 (two) times daily. As needed per pt    [provider]  atorvastatin  (LIPITOR) 20 MG tablet Take 1/2 tab daily for the first two weeks then increase to 1 tablet daily 11/27/23   Robin Toribio POUR, MD  buPROPion  (WELLBUTRIN  XL) 150 MG 24 hr tablet Take 1 tablet (150 mg total) by mouth daily. 12/22/23   Bowen, Darice BRAVO, DO  hydrochlorothiazide  (HYDRODIURIL ) 25 MG tablet Take 1 tablet (25 mg total) by mouth daily. 07/22/23   Robin Toribio POUR, MD  metFORMIN  (GLUCOPHAGE -XR) 750 MG 24 hr tablet Take 1 tablet (750 mg total) by mouth daily with breakfast. 12/22/23   Bowen, Darice BRAVO, DO    Family History Family History  Problem Relation Age of Onset   Hyperlipidemia Mother    Diabetes Mother    Hypertension Mother    Stroke Mother    Heart disease Mother    Depression Mother    Anxiety disorder Mother    Alcohol abuse Mother    Schizophrenia Mother    Obesity Mother    Hypertension Brother    Diabetes Brother    Stomach cancer Brother    Hypertension Brother    Diabetes Brother    Colon cancer Maternal Aunt    Stroke Maternal Grandmother    Heart disease Maternal Grandfather 66   Esophageal cancer Neg Hx    Rectal cancer Neg Hx     Social History Social History   Tobacco Use   Smoking status: Some Days    Current  packs/day: 0.00    Average packs/day: 0.3 packs/day for 17.0 years (4.3 ttl pk-yrs)    Types: Cigarettes    Start date: 09/11/2002    Last attempt to quit: 09/11/2019    Years since quitting: 4.3   Smokeless tobacco: Never  Vaping Use   Vaping status: Never Used  Substance Use Topics   Alcohol use: Yes   Drug use: No     Allergies   Penicillins   Review of Systems Review of Systems  Constitutional:  Negative for chills and fever.  Respiratory:  Negative for cough and shortness of breath.   Gastrointestinal:  Positive for abdominal pain. Negative for blood in stool, constipation, diarrhea, nausea and vomiting.  Genitourinary:  Positive for frequency. Negative for dysuria, flank pain, hematuria, pelvic pain and vaginal discharge.     Physical Exam Triage Vital Signs ED Triage Vitals  Encounter Vitals Group     BP 12/29/23 1257 128/89     Girls Systolic BP Percentile --      Girls Diastolic BP Percentile --      Boys Systolic BP Percentile --      Boys Diastolic BP Percentile --      Pulse Rate 12/29/23 1257 83     Resp 12/29/23 1257 18     Temp 12/29/23 1257 97.9 F (36.6 C)     Temp src --      SpO2 12/29/23 1257 100 %     Weight --      Height --      Head Circumference --      Peak Flow --      Pain Score 12/29/23 1301 6     Pain Loc --      Pain Education --      Exclude from Growth Chart --    No data found.  Updated Vital Signs BP 128/89   Pulse 83   Temp 97.9 F (36.6 C)   Resp 18   SpO2 100%   Visual Acuity Right Eye Distance:   Left Eye Distance:   Bilateral Distance:    Right Eye Near:   Left Eye Near:    Bilateral Near:     Physical Exam Constitutional:      General: She is not in acute distress. HENT:     Mouth/Throat:     Mouth: Mucous  membranes are moist.  Cardiovascular:     Rate and Rhythm: Normal rate and regular rhythm.  Pulmonary:     Effort: Pulmonary effort is normal. No respiratory distress.  Abdominal:     General:  Bowel sounds are normal.     Palpations: Abdomen is soft.     Tenderness: There is no abdominal tenderness. There is no right CVA tenderness, left CVA tenderness, guarding or rebound.  Neurological:     Mental Status: She is alert.      UC Treatments / Results  Labs (all labs ordered are listed, but only abnormal results are displayed) Labs Reviewed  POCT URINE DIPSTICK - Abnormal; Notable for the following components:      Result Value   Spec Grav, UA >=1.030 (*)    POC PROTEIN,UA =100 (*)    All other components within normal limits  POCT URINE PREGNANCY  CERVICOVAGINAL ANCILLARY ONLY    EKG   Radiology No results found.  Procedures Procedures (including critical care time)  Medications Ordered in UC Medications - No data to display  Initial Impression / Assessment and Plan / UC Course  I have reviewed the triage vital signs and the nursing notes.  Pertinent labs & imaging results that were available during my care of the patient were reviewed by me and considered in my medical decision making (see chart for details).    Urinary frequency, lower abdominal pain, vaginal itching.  Afebrile and vital signs are stable.  Urine does not show signs of infection.  Urine pregnancy negative.  Patient obtained vaginal self swab for STD testing.  Instructed her to abstain from sexual activity until all test results are back and treatment is completed if needed.  Discussed that her test results will be available in her MyChart and that we will contact her if treatment is needed.  Education provided on urinary frequency and abdominal pain.  ED precautions given.  Instructed her to follow-up with her PCP.  She agrees to plan of care.  Final Clinical Impressions(s) / UC Diagnoses   Final diagnoses:  Urinary frequency  Lower abdominal pain  Vaginal itching     Discharge Instructions      Your pregnancy test is negative.  Urine does not show signs of infection.  Your vaginal  tests are pending.    Follow up with your primary care provider tomorrow.  Go to the emergency department if you have worsening symptoms.        ED Prescriptions   None    PDMP not reviewed this encounter.   Corlis Burnard DEL, NP 12/29/23 1343

## 2023-12-29 NOTE — ED Triage Notes (Signed)
 Patient to Urgent Care with complaints of abdominal cramping/ urinary frequency and urgency. Vaginal itching.   Symptoms x1 week.   Also requests STD testing.

## 2023-12-30 LAB — CERVICOVAGINAL ANCILLARY ONLY
Bacterial Vaginitis (gardnerella): NEGATIVE
Candida Glabrata: NEGATIVE
Candida Vaginitis: NEGATIVE
Chlamydia: NEGATIVE
Comment: NEGATIVE
Comment: NEGATIVE
Comment: NEGATIVE
Comment: NEGATIVE
Comment: NEGATIVE
Comment: NORMAL
Neisseria Gonorrhea: NEGATIVE
Trichomonas: NEGATIVE

## 2024-01-20 ENCOUNTER — Ambulatory Visit: Admitting: Family Medicine

## 2024-01-21 ENCOUNTER — Ambulatory Visit: Admitting: Family Medicine

## 2024-01-28 ENCOUNTER — Encounter: Payer: Self-pay | Admitting: Family Medicine

## 2024-01-28 ENCOUNTER — Ambulatory Visit: Admitting: Family Medicine

## 2024-01-28 VITALS — BP 131/92 | HR 83 | Temp 98.7°F | Ht 68.0 in | Wt 220.0 lb

## 2024-01-28 DIAGNOSIS — E6609 Other obesity due to excess calories: Secondary | ICD-10-CM

## 2024-01-28 DIAGNOSIS — E66811 Obesity, class 1: Secondary | ICD-10-CM | POA: Diagnosis not present

## 2024-01-28 DIAGNOSIS — E119 Type 2 diabetes mellitus without complications: Secondary | ICD-10-CM | POA: Diagnosis not present

## 2024-01-28 DIAGNOSIS — Z6833 Body mass index (BMI) 33.0-33.9, adult: Secondary | ICD-10-CM | POA: Diagnosis not present

## 2024-01-28 DIAGNOSIS — Z7984 Long term (current) use of oral hypoglycemic drugs: Secondary | ICD-10-CM

## 2024-01-28 DIAGNOSIS — R948 Abnormal results of function studies of other organs and systems: Secondary | ICD-10-CM

## 2024-01-28 DIAGNOSIS — I10 Essential (primary) hypertension: Secondary | ICD-10-CM

## 2024-01-28 NOTE — Progress Notes (Signed)
 Office: [phone]  /  Fax: [phone]  WEIGHT SUMMARY AND BIOMETRICS  Starting Date: 08/05/23  Starting Weight: 242ln   Weight Lost Since Last Visit: 7lb   Vitals Temp: 98.7 F (37.1 C) BP: (!) 131/92 Pulse Rate: 83 SpO2: 100 %   Body Composition  Body Fat %: 39.6 % Fat Mass (lbs): 87.4 lbs Muscle Mass (lbs): 126.4 lbs Total Body Water (lbs): 79.8 lbs Visceral Fat Rating : 9    HPI  Chief Complaint: OBESITY  Robin Fox is here to discuss her progress with her obesity treatment plan. She is on the the Category 1 Plan and states she is following her eating plan approximately 90 % of the time. She states she is exercising 60 minutes 6 times per week.  Interval History:  Since last office visit she is down 7 lb She has been working out at home 6 days/ wk w/ her son She is doing both cardio and weight training She is doing better with eating on a schedule and with meal planning She is drinking a protein shake daily She is not tracking her calories or eating according to the cat 2 meal plan Hunger and cravings slightly worse  This gives her a net weight loss of 22 lb in 5 mos of medically supervised weight management She never started RX for Wellbutrin  (to help with smoking cessation) Denies meal skipping but has felt dizzy a few times post workout  Pharmacotherapy: metformin  XR 750 mg daily  PHYSICAL EXAM:  Blood pressure (!) 131/92, pulse 83, temperature 98.7 F (37.1 C), height 5' 8 (1.727 m), weight 220 lb (99.8 kg), SpO2 100%. Body mass index is 33.45 kg/m.  General: She is overweight, cooperative, alert, well developed, and in no acute distress. PSYCH: Has normal mood, affect and thought process.   Lungs: Normal breathing effort, no conversational dyspnea.   ASSESSMENT AND PLAN  TREATMENT PLAN FOR OBESITY:  Recommended Dietary Goals  Robin Fox is currently in the action stage of change. As such, her goal is to continue weight management plan. She  has agreed to the Category 2 Plan. Reviewed dietary change goals on AVS w/ patient  Behavioral Intervention  We discussed the following Behavioral Modification Strategies today: increasing lean protein intake to established goals, increasing fiber rich foods, avoiding skipping meals, increasing water intake , work on meal planning and preparation, work on tracking and journaling calories using tracking application, keeping healthy foods at home, avoiding temptations and identifying enticing environmental cues, continue to practice mindfulness when eating, planning for success, continue to work on maintaining a reduced calorie state, getting the recommended amount of protein, incorporating whole foods, making healthy choices, staying well hydrated and practicing mindfulness when eating., and increase protein intake, fibrous foods (25 grams per day for women, 30 grams for men) and water to improve satiety and decrease hunger signals. .  Additional resources provided today: NA  Recommended Physical Activity Goals  Summerlynn has been advised to work up to 150 minutes of moderate intensity aerobic activity a week and strengthening exercises 2-3 times per week for cardiovascular health, weight loss maintenance and preservation of muscle mass.   She has agreed to Increase the intensity, frequency or duration of strengthening exercises  and Increase the intensity, frequency or duration of aerobic exercises    Pharmacotherapy changes for the treatment of obesity: none  ASSOCIATED CONDITIONS ADDRESSED TODAY  Type 2 diabetes mellitus without complication, without long-term current use of insulin  [facility]) Lab Results  Component Value Date  HGBA1C 6.2 (H) 11/13/2023  Has good control of type II diabetes Hx of pancreatitis, not a candidate for GLP-1 RA Tolerating Metformin  XR 750 mg daily well Continue to work on a low sugar/ low starch diet with weight loss -     Insulin , random -     Basic metabolic panel  with GFR  Primary hypertension DBP >90 on hydrochlorothiazide  25 mg daily Has f/u with Dr Chandra 10/3 for eval and treatment Avoid use of stimulants Work on stress reduction and limit high sodium foods  Class 1 obesity due to excess calories with serious comorbidity and body mass index (BMI) of 33.0 to 33.9 in adult Improving Down 9% TBW loss in 5 mos of medically supervised weight management on prescribed diet Great job ramping up workouts  Low basal metabolic rate Doing better with caloric intake, protein intake, eating on a schedule, sleep and improved physical activity. OK to increase kcal 1200-1400 per day to reduce muscle loss     She was informed of the importance of frequent follow up visits to maximize her success with intensive lifestyle modifications for her multiple health conditions.   ATTESTASTION STATEMENTS:  Reviewed by clinician on day of visit: allergies, medications, problem list, medical history, surgical history, family history, social history, and previous encounter notes pertinent to obesity diagnosis.   I have personally spent 30 minutes total time today in preparation, patient care, nutritional counseling and education,  and documentation for this visit, including the following: review of most recent clinical lab tests, prescribing medications/ refilling medications, reviewing medical assistant documentation, review and interpretation of bioimpedence results.     [name], D.O. DABFM, DABOM [facility] [address] [place], [place] [address] [phone]

## 2024-01-28 NOTE — Patient Instructions (Addendum)
 Ramp up protein intake to 100-120 grams/ day Daily calories intake should be 1200-1400 per day  Remember fruits and veggies Hydrate well with water Add in a sugar free electrolyte powder to water during workouts (propel, Gzero, sugar free Liquid IV)  Labs today Will adjust metformin  according to labs  Stay on vitamin D  daily

## 2024-01-29 ENCOUNTER — Ambulatory Visit: Payer: Self-pay | Admitting: Family Medicine

## 2024-01-29 LAB — BASIC METABOLIC PANEL WITH GFR
BUN/Creatinine Ratio: 19 (ref 9–23)
BUN: 18 mg/dL (ref 6–24)
CO2: 23 mmol/L (ref 20–29)
Calcium: 10.1 mg/dL (ref 8.7–10.2)
Chloride: 95 mmol/L — ABNORMAL LOW (ref 96–106)
Creatinine, Ser: 0.95 mg/dL (ref 0.57–1.00)
Glucose: 110 mg/dL — ABNORMAL HIGH (ref 70–99)
Potassium: 3.8 mmol/L (ref 3.5–5.2)
Sodium: 136 mmol/L (ref 134–144)
eGFR: 74 mL/min/1.73 (ref >59)

## 2024-01-29 LAB — INSULIN, RANDOM: INSULIN: 15.1 u[IU]/mL (ref 2.6–24.9)

## 2024-02-10 ENCOUNTER — Ambulatory Visit: Admitting: Certified Nurse Midwife

## 2024-02-27 ENCOUNTER — Ambulatory Visit: Admitting: Family Medicine

## 2024-02-27 ENCOUNTER — Encounter: Payer: Self-pay | Admitting: Family Medicine

## 2024-02-27 VITALS — BP 137/87 | HR 76 | Ht 68.0 in | Wt 230.0 lb

## 2024-02-27 DIAGNOSIS — E785 Hyperlipidemia, unspecified: Secondary | ICD-10-CM | POA: Diagnosis not present

## 2024-02-27 DIAGNOSIS — I10 Essential (primary) hypertension: Secondary | ICD-10-CM | POA: Diagnosis not present

## 2024-02-27 DIAGNOSIS — E1169 Type 2 diabetes mellitus with other specified complication: Secondary | ICD-10-CM

## 2024-02-27 DIAGNOSIS — R809 Proteinuria, unspecified: Secondary | ICD-10-CM | POA: Diagnosis not present

## 2024-02-27 DIAGNOSIS — E119 Type 2 diabetes mellitus without complications: Secondary | ICD-10-CM | POA: Diagnosis not present

## 2024-02-27 DIAGNOSIS — M170 Bilateral primary osteoarthritis of knee: Secondary | ICD-10-CM

## 2024-02-27 LAB — POCT URINALYSIS DIP (CLINITEK)
Bilirubin, UA: NEGATIVE
Blood, UA: NEGATIVE
Glucose, UA: NEGATIVE mg/dL
Ketones, POC UA: NEGATIVE mg/dL
Leukocytes, UA: NEGATIVE
Nitrite, UA: NEGATIVE
POC PROTEIN,UA: NEGATIVE
Spec Grav, UA: 1.02 (ref 1.010–1.025)
Urobilinogen, UA: 0.2 U/dL
pH, UA: 6 (ref 5.0–8.0)

## 2024-02-27 LAB — POCT GLYCOSYLATED HEMOGLOBIN (HGB A1C): HbA1c POC (<> result, manual entry): 6.1 % (ref 4.0–5.6)

## 2024-02-27 NOTE — Progress Notes (Signed)
 Established Patient Office Visit  Subjective   Patient ID: Robin Fox, female    DOB: 1976/01/23  Age: 48 y.o. MRN: [id number]  Chief Complaint  Patient presents with   Medical Management of Chronic Issues    HPI  Subjective - Bilateral knee pain. Reports being told of bone to bone arthritis after an ED visit in December 2024 for knee pain. Pain is worse after exercise, specifically with walking. Currently exercising 6 days per week with strength training and cardio. Uses knee braces. Denies using any oral or topical analgesics currently.  - Follow-up on chronic conditions. Reports recent weight gain of approximately 5 lbs, attributed to eating out more frequently and a recent lapse in exercise. Continues to follow the healthy weight and wellness program principles.  Medications Atorvastatin  5mg  taken every other day for borderline high cholesterol, tolerated well. Metformin , hydrochlorothiazide , and acyclovir  are also being taken as prescribed.  PMH, PSH, FH, Social Hx PMHx: Arthritis, borderline hyperlipidemia, prediabetes, hypertension. History of proteinuria on a prior urine test, thought to be related to a UTI; a subsequent microalbumin test in March 2024 was normal. Social Hx: Actively engaged in a weight loss program. Exercises 6 days/week (strength training and cardio).  ROS Musculoskeletal: Positive for bilateral knee pain. Negative for other joint pain. Constitutional: Reports recent weight gain.      The 10-year ASCVD risk score (Arnett DK, et al., 2019) is: 19.8%  Health Maintenance Due  Topic Date Due   COVID-19 Vaccine (1) Never done   Pneumococcal Vaccine (1 of 2 - PCV) Never done   Hepatitis B Vaccines 19-59 Average Risk (1 of 3 - 19+ 3-dose series) Never done   DTaP/Tdap/Td (2 - Td or Tdap) 04/22/2022      Objective:     BP 137/87   Pulse 76   Ht 5' 8 (1.727 m)   Wt 230 lb (104.3 kg)   SpO2 100%   BMI 34.97 kg/m    Physical  Exam Gen: alert, oriented Pulm: no respiratory distress Psych: pleasant affect   Results for orders placed or performed in visit on 02/27/24  POCT glycosylated hemoglobin (Hb A1C)  Result Value Ref Range   Hemoglobin A1C     HbA1c POC (<> result, manual entry) 6.1 4.0 - 5.6 %   HbA1c, POC (prediabetic range)     HbA1c, POC (controlled diabetic range)    POCT URINALYSIS DIP (CLINITEK)  Result Value Ref Range   Color, UA yellow yellow   Clarity, UA clear clear   Glucose, UA negative negative mg/dL   Bilirubin, UA negative negative   Ketones, POC UA negative negative mg/dL   Spec Grav, UA 8.979 8.989 - 1.025   Blood, UA negative negative   pH, UA 6.0 5.0 - 8.0   POC PROTEIN,UA negative negative, trace   Urobilinogen, UA 0.2 0.2 or 1.0 E.U./dL   Nitrite, UA Negative Negative   Leukocytes, UA Negative Negative        Assessment & Plan:   Type 2 diabetes mellitus without complication, without long-term current use of insulin  (HCC) Assessment & Plan: A1C remains stable at 6.1. - Continue current dose of metformin .  Orders: -     POCT glycosylated hemoglobin (Hb A1C)  Proteinuria, unspecified type -     POCT URINALYSIS DIP (CLINITEK)  Primary osteoarthritis of both knees Assessment & Plan: Bilateral Knee Pain, likely Osteoarthritis History of bilateral knee pain, worse with activity. X-ray from 04/2023 confirms left knee arthritis with joint  space narrowing. Patient is active with strength training and cardio which exacerbates symptoms. - Continue using knee braces as needed. - Trial topical analgesics (e.g., Voltaren gel) for symptom management. - Advised to avoid overexertion to the point of significant discomfort. - Discussed steroid injections for acute pain relief and hyaluronic acid injections as an option via orthopedics. Patient declines injections at this time.   Hyperlipidemia associated with type 2 diabetes mellitus (HCC) Assessment & Plan: Last LDL was 104  on atorvastatin  5mg  every other day. Goal is <100. - Increase atorvastatin  to 10mg  (two 5mg  tablets) every other day. If tolerated, will send new prescription at next refill.   Essential hypertension Assessment & Plan: BP is improved today compared to recent readings. - No changes to hydrochlorothiazide  at this time. Will continue to monitor BP.      Return in about 3 months (around 06/10/2024) for DM, HTN, hld.    Toribio MARLA Slain, MD

## 2024-02-27 NOTE — Patient Instructions (Signed)
 It was nice to see you today,  We addressed the following topics today: -I would like you to try taking 2 of your 5 mg tablets (10 mg) of Crestor  every other day instead of 1 tablet. - If you tolerate this I would like you to continue with the 10 mg dosing and I will send it in as that at your next refill. - Your A1c was 6.1.  No changes to your other medications today. - We will check your urine for protein and if it is present again I will send off a more specific test for protein.  Have a great day,  Rolan Slain, MD

## 2024-02-29 DIAGNOSIS — M179 Osteoarthritis of knee, unspecified: Secondary | ICD-10-CM | POA: Insufficient documentation

## 2024-02-29 NOTE — Assessment & Plan Note (Addendum)
 Bilateral Knee Pain, likely Osteoarthritis History of bilateral knee pain, worse with activity. X-ray from 04/2023 confirms left knee arthritis with joint space narrowing. Patient is active with strength training and cardio which exacerbates symptoms. - Continue using knee braces as needed. - Trial topical analgesics (e.g., Voltaren gel) for symptom management. - Advised to avoid overexertion to the point of significant discomfort. - Discussed steroid injections for acute pain relief and hyaluronic acid injections as an option via orthopedics. Patient declines injections at this time.

## 2024-02-29 NOTE — Assessment & Plan Note (Signed)
 BP is improved today compared to recent readings. - No changes to hydrochlorothiazide  at this time. Will continue to monitor BP.

## 2024-02-29 NOTE — Assessment & Plan Note (Signed)
 A1C remains stable at 6.1. - Continue current dose of metformin .

## 2024-02-29 NOTE — Assessment & Plan Note (Signed)
 Last LDL was 104 on atorvastatin  5mg  every other day. Goal is <100. - Increase atorvastatin  to 10mg  (two 5mg  tablets) every other day. If tolerated, will send new prescription at next refill.

## 2024-03-10 ENCOUNTER — Ambulatory Visit: Admitting: Family Medicine

## 2024-03-16 ENCOUNTER — Other Ambulatory Visit: Payer: Self-pay | Admitting: Family Medicine

## 2024-03-16 DIAGNOSIS — Z1231 Encounter for screening mammogram for malignant neoplasm of breast: Secondary | ICD-10-CM

## 2024-03-23 ENCOUNTER — Ambulatory Visit: Admitting: Family Medicine

## 2024-04-06 ENCOUNTER — Ambulatory Visit
Admission: RE | Admit: 2024-04-06 | Discharge: 2024-04-06 | Disposition: A | Discharge status: Home / Self Care | Source: Ambulatory Visit | Attending: Family Medicine | Admitting: Family Medicine

## 2024-04-06 DIAGNOSIS — Z1231 Encounter for screening mammogram for malignant neoplasm of breast: Secondary | ICD-10-CM

## 2024-04-13 ENCOUNTER — Emergency Department

## 2024-04-13 ENCOUNTER — Emergency Department
Admission: EM | Admit: 2024-04-13 | Discharge: 2024-04-13 | Disposition: A | Discharge status: Home / Self Care | Attending: Emergency Medicine | Admitting: Emergency Medicine

## 2024-04-13 ENCOUNTER — Other Ambulatory Visit: Payer: Self-pay

## 2024-04-13 DIAGNOSIS — M5412 Radiculopathy, cervical region: Secondary | ICD-10-CM | POA: Insufficient documentation

## 2024-04-13 DIAGNOSIS — R29818 Other symptoms and signs involving the nervous system: Secondary | ICD-10-CM | POA: Diagnosis not present

## 2024-04-13 DIAGNOSIS — R200 Anesthesia of skin: Secondary | ICD-10-CM | POA: Insufficient documentation

## 2024-04-13 DIAGNOSIS — I10 Essential (primary) hypertension: Secondary | ICD-10-CM | POA: Insufficient documentation

## 2024-04-13 DIAGNOSIS — M79603 Pain in arm, unspecified: Secondary | ICD-10-CM | POA: Diagnosis not present

## 2024-04-13 DIAGNOSIS — R079 Chest pain, unspecified: Secondary | ICD-10-CM

## 2024-04-13 DIAGNOSIS — R0789 Other chest pain: Secondary | ICD-10-CM | POA: Insufficient documentation

## 2024-04-13 DIAGNOSIS — E119 Type 2 diabetes mellitus without complications: Secondary | ICD-10-CM | POA: Insufficient documentation

## 2024-04-13 DIAGNOSIS — R202 Paresthesia of skin: Secondary | ICD-10-CM | POA: Diagnosis not present

## 2024-04-13 LAB — CBG MONITORING, ED: Glucose-Capillary: 112 mg/dL — ABNORMAL HIGH (ref 70–99)

## 2024-04-13 LAB — BASIC METABOLIC PANEL WITH GFR
Anion gap: 15 (ref 5–15)
BUN: 19 mg/dL (ref 6–20)
CO2: 25 mmol/L (ref 22–32)
Calcium: 10.2 mg/dL (ref 8.9–10.3)
Chloride: 98 mmol/L (ref 98–111)
Creatinine, Ser: 0.9 mg/dL (ref 0.44–1.00)
GFR, Estimated: >60 mL/min (ref >60)
Glucose, Bld: 104 mg/dL — ABNORMAL HIGH (ref 70–99)
Potassium: 3.5 mmol/L (ref 3.5–5.1)
Sodium: 138 mmol/L (ref 135–145)

## 2024-04-13 LAB — CBC
HCT: 43.2 % (ref 36.0–46.0)
Hemoglobin: 13.7 g/dL (ref 12.0–15.0)
MCH: 22.4 pg — ABNORMAL LOW (ref 26.0–34.0)
MCHC: 31.7 g/dL (ref 30.0–36.0)
MCV: 70.6 fL — ABNORMAL LOW (ref 80.0–100.0)
Platelets: 311 K/uL (ref 150–400)
RBC: 6.12 MIL/uL — ABNORMAL HIGH (ref 3.87–5.11)
RDW: 14.6 % (ref 11.5–15.5)
WBC: 8.5 K/uL (ref 4.0–10.5)
nRBC: 0 % (ref 0.0–0.2)

## 2024-04-13 LAB — TROPONIN T, HIGH SENSITIVITY
Troponin T High Sensitivity: <15 ng/L (ref 0–19)
Troponin T High Sensitivity: <15 ng/L (ref 0–19)

## 2024-04-13 LAB — POC URINE PREG, ED: Preg Test, Ur: NEGATIVE

## 2024-04-13 MED ORDER — CYCLOBENZAPRINE HCL 5 MG PO TABS: 5.0000 mg | ORAL_TABLET | Freq: Three times a day (TID) | ORAL | 0 refills | Status: AC | PRN

## 2024-04-13 MED ORDER — CYCLOBENZAPRINE HCL 10 MG PO TABS
5.0000 mg | ORAL_TABLET | Freq: Once | ORAL | Status: AC
  Administered 2024-04-13: 5 mg via ORAL

## 2024-04-13 MED ORDER — CYCLOBENZAPRINE HCL 10 MG PO TABS
ORAL_TABLET | ORAL | Status: AC
  Filled 2024-04-13: qty 1

## 2024-04-13 NOTE — ED Notes (Signed)
 Patient transported to CT

## 2024-04-13 NOTE — ED Triage Notes (Signed)
 Pt reports she developed left side face numbness and chest discomfort this afternoon around 1300. Pt reports hx HTN. Speech clear pt able to move all extremities. Pt reports being under a lot of stress recently.

## 2024-04-13 NOTE — ED Provider Notes (Signed)
 [facility] Provider Note   Event Date/Time   First MD Initiated Contact with Patient 04/13/24 2050     (approximate) History  Chest Pain  HPI Robin Fox is a 48 y.o. female with a past medical history of hypertension, hyperlipidemia, and diabetes who presents complaining of left-sided facial numbness and left arm numbness that began earlier today.  Patient states that she has associated chest discomfort.  Patient denies any exacerbating or relieving factors recently.  Patient does states she is under a lot of stress recently.  Patient states that she has been working out 6 times a week for the last few months and is concerned that she may have injured her neck.  Patient states that this numbness has been improving since onset ROS: Patient currently denies any vision changes, tinnitus, difficulty speaking, facial droop, sore throat, chest pain, shortness of breath, abdominal pain, nausea/vomiting/diarrhea, dysuria, or weakness/numbness/paresthesias in any extremity   Physical Exam  Triage Vital Signs: ED Triage Vitals  Encounter Vitals Group     BP 04/13/24 1955 (!) 155/94     Girls Systolic BP Percentile --      Girls Diastolic BP Percentile --      Boys Systolic BP Percentile --      Boys Diastolic BP Percentile --      Pulse Rate 04/13/24 1955 60     Resp 04/13/24 1955 16     Temp 04/13/24 1955 98.6 F (37 C)     Temp Source 04/13/24 1955 Oral     SpO2 04/13/24 1955 100 %     Weight 04/13/24 1953 218 lb (98.9 kg)     Height 04/13/24 1953 5' 7 (1.702 m)     Head Circumference --      Peak Flow --      Pain Score 04/13/24 1953 8     Pain Loc --      Pain Education --      Exclude from Growth Chart --    Most recent vital signs: Vitals:   04/13/24 1955  BP: (!) 155/94  Pulse: 60  Resp: 16  Temp: 98.6 F (37 C)  SpO2: 100%   General: Awake, oriented x4. CV:  Good peripheral perfusion. Resp:  Normal effort. Abd:  No  distention. Other:  Middle-aged overweight African-American female resting comfortably in no acute distress ED Results / Procedures / Treatments  Labs (all labs ordered are listed, but only abnormal results are displayed) Labs Reviewed  BASIC METABOLIC PANEL WITH GFR - Abnormal; Notable for the following components:      Result Value   Glucose, Bld 104 (*)    All other components within normal limits  CBC - Abnormal; Notable for the following components:   RBC 6.12 (*)    MCV 70.6 (*)    MCH 22.4 (*)    All other components within normal limits  CBG MONITORING, ED - Abnormal; Notable for the following components:   Glucose-Capillary 112 (*)    All other components within normal limits  POC URINE PREG, ED  TROPONIN T, HIGH SENSITIVITY  TROPONIN T, HIGH SENSITIVITY   EKG ED ECG REPORT I, Artist MARLA Kerns, the attending physician, personally viewed and interpreted this ECG. Date: 04/13/2024 EKG Time: 1954 Rate: 72 Rhythm: normal sinus rhythm QRS Axis: normal Intervals: normal ST/T Wave abnormalities: normal Narrative Interpretation: no evidence of acute ischemia RADIOLOGY ED MD interpretation: 2 view chest x-ray interpreted by me shows no evidence of acute  abnormalities including no pneumonia, pneumothorax, or widened mediastinum CT of the head without contrast interpreted by me shows no evidence of acute abnormalities including no intracerebral hemorrhage, obvious masses, or significant edema - All radiology independently interpreted and agree with radiology assessment Official radiology report(s): CT Head Wo Contrast Result Date: 04/13/2024 EXAM: CT HEAD WITHOUT CONTRAST 04/13/2024 09:48:14 PM TECHNIQUE: CT of the head was performed without the administration of intravenous contrast. Automated exposure control, iterative reconstruction, and/or weight based adjustment of the mA/kV was utilized to reduce the radiation dose to as low as reasonably achievable. COMPARISON: CT head  05/18/2022. CLINICAL HISTORY: Neuro deficit, acute, stroke suspected. FINDINGS: BRAIN AND VENTRICLES: No acute hemorrhage. No evidence of acute infarct. No hydrocephalus. No extra-axial collection. No mass effect or midline shift. Partially empty sella. ORBITS: No acute abnormality. SINUSES: No acute abnormality. SOFT TISSUES AND SKULL: No acute soft tissue abnormality. No skull fracture. IMPRESSION: 1. No acute intracranial abnormality . 2. Partial empty sella. Findings is often a normal anatomic variant but can be associated with idiopathic intracranial hypertension (pseudotumor cerebri). Electronically signed by: Morgane Naveau MD 04/13/2024 10:11 PM EST RP Workstation: [id number]   DG Chest 2 View Result Date: 04/13/2024 EXAM: 2 VIEW(S) XRAY OF THE CHEST 04/13/2024 08:14:15 PM COMPARISON: 02/28/2023 CLINICAL HISTORY: cp FINDINGS: LUNGS AND PLEURA: No focal pulmonary opacity. No pleural effusion. No pneumothorax. HEART AND MEDIASTINUM: No acute abnormality of the cardiac and mediastinal silhouettes. BONES AND SOFT TISSUES: No acute osseous abnormality. IMPRESSION: 1. No acute process. Electronically signed by: Dorethia Molt MD 04/13/2024 08:49 PM EST RP Workstation: [id number]   PROCEDURES: Critical Care performed: No Procedures MEDICATIONS ORDERED IN ED: Medications  cyclobenzaprine  (FLEXERIL ) tablet 5 mg (has no administration in time range)   IMPRESSION / MDM / ASSESSMENT AND PLAN / ED COURSE  I reviewed the triage vital signs and the nursing notes.                             The patient is on the cardiac monitor to evaluate for evidence of arrhythmia and/or significant heart rate changes. Patient's presentation is most consistent with acute presentation with potential threat to life or bodily function. Patient is a 48 year old female with the above-stated past medical history who presents complaining of of left sided facial numbness, left upper extremity numbness, and left chest discomfort  that began at 1300 today DDx: CVA, TIA, ACS, cervical spinal injury Plan: CBC, CMP, troponin, chest x-ray, EKG, head CT  Laboratory and radiologic evaluation does not show any evidence of acute abnormalities.  Given the distribution of patient's numbness, I am concerned for possible cervical radiculopathy.  Patient is an avid gym goer and may have strained or sprained this cervical paraspinal musculature.  Patient is tender to the left lateral aspect of the mid cervical spine.  Patient agrees with plan for discharge at this time with anti-inflammatories, muscle relaxants, and follow-up with her primary care physician.  Patient given strict return precautions and all questions answered prior to discharge  Dispo: Discharge home with PCP follow-up as needed Clinical Course as of 04/13/24 2240  Tue Apr 13, 2024  2117 DG Chest 2 View [EB]    Clinical Course User Index [EB] Jossie Artist POUR, MD   FINAL CLINICAL IMPRESSION(S) / ED DIAGNOSES   Final diagnoses:  Left-sided chest pain  Left upper extremity numbness  Left facial numbness  Cervical radiculopathy   Rx / DC Orders   ED  Discharge Orders          Ordered    cyclobenzaprine  (FLEXERIL ) 5 MG tablet  3 times daily PRN        04/13/24 2238           Note:  This document was prepared using Dragon voice recognition software and may include unintentional dictation errors.   Dejae Bernet K, MD 04/13/24 [phone]

## 2024-04-14 ENCOUNTER — Ambulatory Visit: Admitting: Obstetrics and Gynecology

## 2024-06-01 DIAGNOSIS — E1169 Type 2 diabetes mellitus with other specified complication: Secondary | ICD-10-CM

## 2024-06-01 DIAGNOSIS — I10 Essential (primary) hypertension: Secondary | ICD-10-CM

## 2024-06-01 DIAGNOSIS — R718 Other abnormality of red blood cells: Secondary | ICD-10-CM

## 2024-06-01 DIAGNOSIS — E119 Type 2 diabetes mellitus without complications: Secondary | ICD-10-CM

## 2024-06-03 ENCOUNTER — Other Ambulatory Visit

## 2024-06-03 ENCOUNTER — Telehealth: Payer: Self-pay

## 2024-06-03 DIAGNOSIS — I10 Essential (primary) hypertension: Secondary | ICD-10-CM

## 2024-06-03 DIAGNOSIS — R718 Other abnormality of red blood cells: Secondary | ICD-10-CM

## 2024-06-03 DIAGNOSIS — E119 Type 2 diabetes mellitus without complications: Secondary | ICD-10-CM

## 2024-06-03 DIAGNOSIS — E1169 Type 2 diabetes mellitus with other specified complication: Secondary | ICD-10-CM

## 2024-06-03 NOTE — Telephone Encounter (Signed)
 We are trying to find out why her MCV is consistently low.  The previous tests to look at things like sickle cell or thalassemia were normal.

## 2024-06-03 NOTE — Telephone Encounter (Signed)
 Called patient she is advised

## 2024-06-03 NOTE — Telephone Encounter (Signed)
 Copied from CRM [id number]. Topic: Clinical - Medical Advice >> Jun 03, 2024 11:10 AM Amy B wrote: Reason for CRM: Patient would like to know why a lead blood test was ordered.  She is worried something may be wrong that would warrant her to have that type of test drawn.  She requests a call back to discuss [phone]

## 2024-06-04 ENCOUNTER — Ambulatory Visit: Payer: Self-pay | Admitting: Family Medicine

## 2024-06-07 ENCOUNTER — Ambulatory Visit: Admitting: Obstetrics and Gynecology

## 2024-06-07 LAB — COMPREHENSIVE METABOLIC PANEL WITH GFR
ALT: 16 IU/L (ref 0–32)
AST: 27 IU/L (ref 0–40)
Albumin: 4.7 g/dL (ref 3.9–4.9)
Alkaline Phosphatase: 71 IU/L (ref 41–116)
BUN/Creatinine Ratio: 13 (ref 9–23)
BUN: 13 mg/dL (ref 6–24)
Bilirubin Total: 0.6 mg/dL (ref 0.0–1.2)
CO2: 25 mmol/L (ref 20–29)
Calcium: 9.6 mg/dL (ref 8.7–10.2)
Chloride: 98 mmol/L (ref 96–106)
Creatinine, Ser: 0.99 mg/dL (ref 0.57–1.00)
Globulin, Total: 2.9 g/dL (ref 1.5–4.5)
Glucose: 101 mg/dL — ABNORMAL HIGH (ref 70–99)
Potassium: 4 mmol/L (ref 3.5–5.2)
Sodium: 138 mmol/L (ref 134–144)
Total Protein: 7.6 g/dL (ref 6.0–8.5)
eGFR: 71 mL/min/1.73

## 2024-06-07 LAB — CBC WITH DIFFERENTIAL/PLATELET
Basophils Absolute: 0 x10E3/uL (ref 0.0–0.2)
Basos: 0 %
EOS (ABSOLUTE): 0.2 x10E3/uL (ref 0.0–0.4)
Eos: 4 %
Hematocrit: 48.3 % — ABNORMAL HIGH (ref 34.0–46.6)
Hemoglobin: 15 g/dL (ref 11.1–15.9)
Immature Grans (Abs): 0 x10E3/uL (ref 0.0–0.1)
Immature Granulocytes: 0 %
Lymphocytes Absolute: 2.9 x10E3/uL (ref 0.7–3.1)
Lymphs: 42 %
MCH: 23.1 pg — ABNORMAL LOW (ref 26.6–33.0)
MCHC: 31.1 g/dL — ABNORMAL LOW (ref 31.5–35.7)
MCV: 75 fL — ABNORMAL LOW (ref 79–97)
Monocytes Absolute: 0.3 x10E3/uL (ref 0.1–0.9)
Monocytes: 5 %
Neutrophils Absolute: 3.3 x10E3/uL (ref 1.4–7.0)
Neutrophils: 49 %
Platelets: 313 x10E3/uL (ref 150–450)
RBC: 6.48 x10E6/uL — ABNORMAL HIGH (ref 3.77–5.28)
RDW: 17.8 % — ABNORMAL HIGH (ref 11.7–15.4)
WBC: 6.8 x10E3/uL (ref 3.4–10.8)

## 2024-06-07 LAB — LIPID PANEL
Chol/HDL Ratio: 3.4 ratio (ref 0.0–4.4)
Cholesterol, Total: 208 mg/dL — ABNORMAL HIGH (ref 100–199)
HDL: 62 mg/dL
LDL Chol Calc (NIH): 125 mg/dL — ABNORMAL HIGH (ref 0–99)
Triglycerides: 121 mg/dL (ref 0–149)
VLDL Cholesterol Cal: 21 mg/dL (ref 5–40)

## 2024-06-07 LAB — MICROALBUMIN / CREATININE URINE RATIO
Creatinine, Urine: 248.9 mg/dL
Microalb/Creat Ratio: 7 mg/g{creat} (ref 0–29)
Microalbumin, Urine: 18.6 ug/mL

## 2024-06-07 LAB — LEAD, BLOOD (ADULT >= 16 YRS): Lead-Whole Blood: 1.3 ug/dL (ref 0.0–3.4)

## 2024-06-07 LAB — HEMOGLOBIN A1C
Est. average glucose Bld gHb Est-mCnc: 128 mg/dL
Hgb A1c MFr Bld: 6.1 % — ABNORMAL HIGH (ref 4.8–5.6)

## 2024-06-07 LAB — CERULOPLASMIN: Ceruloplasmin: 36.6 mg/dL (ref 19.0–39.0)

## 2024-06-08 ENCOUNTER — Other Ambulatory Visit: Payer: Self-pay

## 2024-06-08 ENCOUNTER — Encounter: Payer: Self-pay | Admitting: Emergency Medicine

## 2024-06-08 ENCOUNTER — Emergency Department

## 2024-06-08 ENCOUNTER — Emergency Department
Admission: EM | Admit: 2024-06-08 | Discharge: 2024-06-09 | Disposition: A | Discharge status: Home / Self Care | Attending: Emergency Medicine | Admitting: Emergency Medicine

## 2024-06-08 DIAGNOSIS — E119 Type 2 diabetes mellitus without complications: Secondary | ICD-10-CM | POA: Diagnosis not present

## 2024-06-08 DIAGNOSIS — I10 Essential (primary) hypertension: Secondary | ICD-10-CM | POA: Diagnosis not present

## 2024-06-08 DIAGNOSIS — J9801 Acute bronchospasm: Secondary | ICD-10-CM | POA: Diagnosis not present

## 2024-06-08 DIAGNOSIS — Z7984 Long term (current) use of oral hypoglycemic drugs: Secondary | ICD-10-CM | POA: Diagnosis not present

## 2024-06-08 DIAGNOSIS — R059 Cough, unspecified: Secondary | ICD-10-CM | POA: Diagnosis present

## 2024-06-08 LAB — URINALYSIS, ROUTINE W REFLEX MICROSCOPIC
Bacteria, UA: NONE SEEN
Bilirubin Urine: NEGATIVE
Glucose, UA: NEGATIVE mg/dL
Ketones, ur: NEGATIVE mg/dL
Leukocytes,Ua: NEGATIVE
Nitrite: NEGATIVE
Protein, ur: NEGATIVE mg/dL
Specific Gravity, Urine: 1.012 (ref 1.005–1.030)
pH: 5 (ref 5.0–8.0)

## 2024-06-08 LAB — BASIC METABOLIC PANEL WITH GFR
Anion gap: 13 (ref 5–15)
BUN: 15 mg/dL (ref 6–20)
CO2: 30 mmol/L (ref 22–32)
Calcium: 9.8 mg/dL (ref 8.9–10.3)
Chloride: 98 mmol/L (ref 98–111)
Creatinine, Ser: 1.01 mg/dL — ABNORMAL HIGH (ref 0.44–1.00)
GFR, Estimated: >60 mL/min
Glucose, Bld: 110 mg/dL — ABNORMAL HIGH (ref 70–99)
Potassium: 3.5 mmol/L (ref 3.5–5.1)
Sodium: 140 mmol/L (ref 135–145)

## 2024-06-08 LAB — POC URINE PREG, ED: Preg Test, Ur: NEGATIVE

## 2024-06-08 LAB — CBC
HCT: 45.1 % (ref 36.0–46.0)
Hemoglobin: 14.5 g/dL (ref 12.0–15.0)
MCH: 22.7 pg — ABNORMAL LOW (ref 26.0–34.0)
MCHC: 32.2 g/dL (ref 30.0–36.0)
MCV: 70.6 fL — ABNORMAL LOW (ref 80.0–100.0)
Platelets: 287 K/uL (ref 150–400)
RBC: 6.39 MIL/uL — ABNORMAL HIGH (ref 3.87–5.11)
RDW: 16.9 % — ABNORMAL HIGH (ref 11.5–15.5)
WBC: 8 K/uL (ref 4.0–10.5)
nRBC: 0 % (ref 0.0–0.2)

## 2024-06-08 LAB — TROPONIN T, HIGH SENSITIVITY: Troponin T High Sensitivity: <15 ng/L (ref 0–19)

## 2024-06-08 MED ORDER — ALBUTEROL SULFATE HFA 108 (90 BASE) MCG/ACT IN AERS: 2.0000 | INHALATION_SPRAY | RESPIRATORY_TRACT | 0 refills | Status: AC | PRN

## 2024-06-08 MED ORDER — IPRATROPIUM-ALBUTEROL 0.5-2.5 (3) MG/3ML IN SOLN
3.0000 mL | Freq: Once | RESPIRATORY_TRACT | Status: AC
  Administered 2024-06-08: 3 mL via RESPIRATORY_TRACT
  Filled 2024-06-08: qty 3

## 2024-06-08 MED ORDER — DOXYCYCLINE HYCLATE 100 MG PO TABS: 100.0000 mg | ORAL_TABLET | Freq: Two times a day (BID) | ORAL | 0 refills | Status: AC

## 2024-06-08 MED ORDER — DOXYCYCLINE HYCLATE 100 MG PO TABS
100.0000 mg | ORAL_TABLET | Freq: Once | ORAL | Status: AC
  Administered 2024-06-08: 100 mg via ORAL
  Filled 2024-06-08: qty 1

## 2024-06-08 MED ORDER — PREDNISONE 20 MG PO TABS
60.0000 mg | ORAL_TABLET | Freq: Once | ORAL | Status: AC
  Administered 2024-06-08: 60 mg via ORAL
  Filled 2024-06-08: qty 3

## 2024-06-08 MED ORDER — PREDNISONE 10 MG (21) PO TBPK: ORAL_TABLET | ORAL | 0 refills | Status: AC

## 2024-06-08 NOTE — ED Triage Notes (Signed)
 Pt presents to the ED via POV with complaints of cough, shortness of breath, and wheezing x 3 days. She notes using her sons inhaler trying to self medicate without any improvement in sx. Endorses some CP after coughing. A&Ox4 at this time. Denies fevers.

## 2024-06-08 NOTE — ED Provider Notes (Signed)
 "  [facility] Provider Note    Event Date/Time   First MD Initiated Contact with Patient 06/08/24 2306     (approximate)   History   Shortness of Breath   HPI  Robin Fox is a 49 y.o. female with history of tobacco use, hypertension, hyperlipidemia, diabetes on metformin  who presents emergency department with complaints of shortness of breath, wheezing ongoing for 2 weeks.  No known fevers.  Has been using her son's albuterol  without much relief.  No calf tenderness or calf swelling.  No history of PE or DVT.  No chest pain.  Has never been formally diagnosed with asthma or COPD.  No history of CHF.  Has not seen her doctor for this.  Has not been on antibiotics or steroids.   History provided by patient.    Past Medical History:  Diagnosis Date   Anemia    hx    Anxiety    Diabetes (HCC)    Essential hypertension 11/09/2011   Baseline labs, Testing 32/wks, serial growth ultrasound  Protein 96; cmp nml; plat 329  Growth 03/23/12 80%     Gestational diabetes    Neg 2 hr test after preg   Headache(784.0)    Herpes genitalia    2013 - 1st outbreak   Hx of herpes genitalis 11/21/2011   Acyclovir . Needs suppression at 35 weeks.     Hyperlipidemia    Hypertension    2008   Hypertension in pregnancy, essential, antepartum 11/09/2011   Joint pain    Osteoarthritis    Pancreatic disease    Pre-eclampsia in third trimester    Seasonal allergies    Shortness of breath    w exertion   Trichomonas    Twins     Past Surgical History:  Procedure Laterality Date   CESAREAN SECTION     X 2   CESAREAN SECTION  04/21/2012   Procedure: CESAREAN SECTION;  Surgeon: Lynwood KANDICE Solomons, MD;  Location: [facility];  Service: Obstetrics;  Laterality: N/A;   TOOTH EXTRACTION      MEDICATIONS:  Prior to Admission medications  Medication Sig Start Date End Date Taking? Authorizing Provider  acyclovir  (ZOVIRAX ) 400 MG tablet Take 400 mg by mouth 2 (two)  times daily. As needed per pt    [provider]  atorvastatin  (LIPITOR) 20 MG tablet Take 1/2 tab daily for the first two weeks then increase to 1 tablet daily 11/27/23   Chandra Toribio POUR, MD  hydrochlorothiazide  (HYDRODIURIL ) 25 MG tablet Take 1 tablet (25 mg total) by mouth daily. 07/22/23   Chandra Toribio POUR, MD  metFORMIN  (GLUCOPHAGE -XR) 750 MG 24 hr tablet Take 1 tablet (750 mg total) by mouth daily with breakfast. 12/22/23   Waylan Darice BRAVO, DO    Physical Exam   Triage Vital Signs: ED Triage Vitals  Encounter Vitals Group     BP 06/08/24 1912 (!) 132/96     Girls Systolic BP Percentile --      Girls Diastolic BP Percentile --      Boys Systolic BP Percentile --      Boys Diastolic BP Percentile --      Pulse Rate 06/08/24 1912 83     Resp 06/08/24 1912 18     Temp 06/08/24 1912 97.9 F (36.6 C)     Temp Source 06/08/24 1912 Oral     SpO2 06/08/24 1912 100 %     Weight 06/08/24 1909 220 lb (99.8 kg)  Height 06/08/24 1909 5' 7 (1.702 m)     Head Circumference --      Peak Flow --      Pain Score 06/08/24 1907 0     Pain Loc --      Pain Education --      Exclude from Growth Chart --     Most recent vital signs: Vitals:   06/08/24 1912  BP: (!) 132/96  Pulse: 83  Resp: 18  Temp: 97.9 F (36.6 C)  SpO2: 100%    CONSTITUTIONAL: Alert, responds appropriately to questions. Well-appearing; well-nourished HEAD: Normocephalic, atraumatic EYES: Conjunctivae clear, pupils appear equal, sclera nonicteric ENT: normal nose; moist mucous membranes NECK: Supple, normal ROM CARD: RRR; S1 and S2 appreciated RESP: Normal chest excursion without splinting or tachypnea; breath sounds clear and equal bilaterally; no wheezes, no rhonchi, no rales, no hypoxia or respiratory distress, speaking full sentences ABD/GI: Non-distended; soft, non-tender, no rebound, no guarding, no peritoneal signs BACK: The back appears normal EXT: Normal ROM in all joints; no deformity noted, no  edema, no calf tenderness or calf swelling SKIN: Normal color for age and race; warm; no rash on exposed skin NEURO: Moves all extremities equally, normal speech PSYCH: The patient's mood and manner are appropriate.   ED Results / Procedures / Treatments   LABS: (all labs ordered are listed, but only abnormal results are displayed) Labs Reviewed  BASIC METABOLIC PANEL WITH GFR - Abnormal; Notable for the following components:      Result Value   Glucose, Bld 110 (*)    Creatinine, Ser 1.01 (*)    All other components within normal limits  CBC - Abnormal; Notable for the following components:   RBC 6.39 (*)    MCV 70.6 (*)    MCH 22.7 (*)    RDW 16.9 (*)    All other components within normal limits  URINALYSIS, ROUTINE W REFLEX MICROSCOPIC - Abnormal; Notable for the following components:   Color, Urine YELLOW (*)    APPearance HAZY (*)    Hgb urine dipstick SMALL (*)    All other components within normal limits  RESP PANEL BY RT-PCR (RSV, FLU A&B, COVID)  RVPGX2  POC URINE PREG, ED  TROPONIN T, HIGH SENSITIVITY  TROPONIN T, HIGH SENSITIVITY     EKG:  EKG Interpretation Date/Time:  Tuesday June 08 2024 19:09:27 EST Ventricular Rate:  83 PR Interval:  154 QRS Duration:  88 QT Interval:  400 QTC Calculation: 470 R Axis:   86  Text Interpretation: Normal sinus rhythm Normal ECG When compared with ECG of 13-Apr-2024 19:54, Premature ventricular complexes are no longer Present Confirmed by Neomi Neptune [phone]) on 06/08/2024 11:45:41 PM         RADIOLOGY: My personal review and interpretation of imaging: Chest x-ray clear.  I have personally reviewed all radiology reports.   DG Chest 2 View Result Date: 06/08/2024 CLINICAL DATA:  Cough, short of breath, wheezing EXAM: CHEST - 2 VIEW COMPARISON:  04/13/2024 FINDINGS: Frontal and lateral views of the chest demonstrate an unremarkable cardiac silhouette. No acute airspace disease, effusion, or pneumothorax. No acute  bony abnormalities. IMPRESSION: 1. No acute intrathoracic process. Electronically Signed   By: Ozell Daring M.D.   On: 06/08/2024 20:17     PROCEDURES:  Critical Care performed: No      Procedures    IMPRESSION / MDM / ASSESSMENT AND PLAN / ED COURSE  I reviewed the triage vital signs and the nursing notes.  Patient here with shortness of breath, wheezing.  Currently lungs clear to auscultation.  Symptoms ongoing for 2 weeks.  The patient is on the cardiac monitor to evaluate for evidence of arrhythmia and/or significant heart rate changes.   DIFFERENTIAL DIAGNOSIS (includes but not limited to):   Bronchospasm, viral URI, possible underlying COPD or emphysema, pneumonia, doubt CHF, PE, ACS   Patient's presentation is most consistent with acute presentation with potential threat to life or bodily function.   PLAN: Workup initiated from triage.  Normal hemoglobin, electrolytes.  Negative troponin.  No indication for serial enzymes.  Urine shows no sign of infection.  Chest x-ray reviewed and interpreted by myself and the radiologist and is clear.  Currently patient is moving air without any wheezing, rhonchi or rales.  No distress or hypoxia.  No increased work of breathing.  Will give DuoNeb here for symptomatic relief and start her on prednisone  taper as well as doxycycline  to cover for any potential bacterial cause given symptoms ongoing now for 2 weeks.  Have encouraged her to follow-up with her primary care doctor.  Will place pulmonology referral for patient.  She has never had pulmonary function test.  Will obtain COVID, flu and RSV swab as this may help with management as an outpatient.  Patient will follow-up on these results through MyChart.  I feel she is safe for discharge home.  Will discharge with albuterol  inhaler as well.   MEDICATIONS GIVEN IN ED: Medications  ipratropium-albuterol  (DUONEB) 0.5-2.5 (3) MG/3ML nebulizer solution 3 mL (3 mLs Nebulization  Given 06/08/24 2352)  predniSONE  (DELTASONE ) tablet 60 mg (60 mg Oral Given 06/08/24 2350)  doxycycline  (VIBRA -TABS) tablet 100 mg (100 mg Oral Given 06/08/24 2350)     ED COURSE:  At this time, I do not feel there is any life-threatening condition present. I reviewed all nursing notes, vitals, pertinent previous records.  All lab and urine results, EKGs, imaging ordered have been independently reviewed and interpreted by myself.  I reviewed all available radiology reports from any imaging ordered this visit.  Based on my assessment, I feel the patient is safe to be discharged home without further emergent workup and can continue workup as an outpatient as needed. Discussed all findings, treatment plan as well as usual and customary return precautions.  They verbalize understanding and are comfortable with this plan.  Outpatient follow-up has been provided as needed.  All questions have been answered.    CONSULTS:  none   OUTSIDE RECORDS REVIEWED: Reviewed recent family medicine notes.       FINAL CLINICAL IMPRESSION(S) / ED DIAGNOSES   Final diagnoses:  Bronchospasm     Rx / DC Orders   ED Discharge Orders          Ordered    Ambulatory referral to Pulmonology        06/09/24 0004    albuterol  (VENTOLIN  HFA) 108 (90 Base) MCG/ACT inhaler  Every 4 hours PRN        06/08/24 2342    predniSONE  (STERAPRED UNI-PAK 21 TAB) 10 MG (21) TBPK tablet        06/08/24 2342    doxycycline  (VIBRA -TABS) 100 MG tablet  2 times daily        06/08/24 2342             Note:  This document was prepared using Dragon voice recognition software and may include unintentional dictation errors.   Rika Daughdrill, Josette SAILOR, DO 06/09/24 0004  "

## 2024-06-09 LAB — RESP PANEL BY RT-PCR (RSV, FLU A&B, COVID)  RVPGX2
Influenza A by PCR: NEGATIVE
Influenza B by PCR: NEGATIVE
Resp Syncytial Virus by PCR: NEGATIVE
SARS Coronavirus 2 by RT PCR: NEGATIVE

## 2024-06-10 ENCOUNTER — Encounter: Payer: Self-pay | Admitting: Family Medicine

## 2024-06-10 ENCOUNTER — Ambulatory Visit: Admitting: Family Medicine

## 2024-06-10 VITALS — BP 134/80 | HR 75 | Ht 67.0 in | Wt 225.4 lb

## 2024-06-10 DIAGNOSIS — E119 Type 2 diabetes mellitus without complications: Secondary | ICD-10-CM | POA: Diagnosis not present

## 2024-06-10 DIAGNOSIS — E1169 Type 2 diabetes mellitus with other specified complication: Secondary | ICD-10-CM

## 2024-06-10 DIAGNOSIS — E785 Hyperlipidemia, unspecified: Secondary | ICD-10-CM

## 2024-06-10 DIAGNOSIS — R718 Other abnormality of red blood cells: Secondary | ICD-10-CM | POA: Diagnosis not present

## 2024-06-10 DIAGNOSIS — Z720 Tobacco use: Secondary | ICD-10-CM | POA: Diagnosis not present

## 2024-06-10 DIAGNOSIS — N920 Excessive and frequent menstruation with regular cycle: Secondary | ICD-10-CM | POA: Diagnosis not present

## 2024-06-10 NOTE — Assessment & Plan Note (Signed)
 Persistent low MCV with normal ferritin and iron levels. No thalassemia or sickle cell trait. Possible mild iron deficiency due to heavy bleeding. Testing for lead, ceruloplasmin levels - all normal.  Will hold off on any additional workup unless pt develops anemia.  - Take iron supplement every other day to prevent iron deficiency. - Monitor for signs of anemia and consider referral to hematologist if anemia develops.

## 2024-06-10 NOTE — Patient Instructions (Signed)
" °  VISIT SUMMARY: Today we addressed your respiratory symptoms, heavy menstrual bleeding, low MCV, hyperlipidemia, and smoking cessation efforts.  YOUR PLAN: ACUTE BRONCHITIS AND EVALUATION FOR CHRONIC OBSTRUCTIVE PULMONARY DISEASE: You recently visited the ED for respiratory symptoms. We are considering COPD and bronchitis as possible causes. -Continue the steroid taper as prescribed. -Use albuterol  as needed for wheezing. -Follow up with pulmonology for a pulmonary function test. -Contact [facility] if you do not see improvement in a couple of weeks. The number is [phone]  HEAVY MENSTRUAL BLEEDING DUE TO UTERINE FIBROID: Your heavy menstrual bleeding is likely related to a uterine fibroid. -Continue to follow up with your gynecologist in February for evaluation and management of the fibroid and heavy bleeding.  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 -Take an iron supplement every other day to prevent iron deficiency. -Monitor for signs of anemia and consider seeing a hematologist if anemia develops.  HYPERLIPIDEMIA: You are currently taking rosuvastatin  for high cholesterol. -Increase your rosuvastatin  to daily dosing for better cholesterol management.  TOBACCO USE DISORDER: You are a heavy smoker and are trying to quit. -Continue your efforts to quit smoking.   "

## 2024-06-10 NOTE — Assessment & Plan Note (Signed)
 Currently on rosuvastatin  5 mg every other day. Plan to increase to daily dosing for improved cholesterol management. - Increase rosuvastatin  to daily dosing.

## 2024-06-10 NOTE — Assessment & Plan Note (Signed)
 Heavy bleeding likely related to uterine fibroid. Scheduled gynecologist visit in February for further evaluation. - Continue follow up with gynecologist in February for evaluation and management of fibroid and heavy bleeding.

## 2024-06-10 NOTE — Progress Notes (Signed)
 "  Established Patient Office Visit  Subjective   Patient ID: Robin Fox, female    DOB: January 23, 1976  Age: 49 y.o. MRN: [id number]  Chief Complaint  Patient presents with   Medical Management of Chronic Issues       History of Present Illness   Robin Fox is a 49 year old female who presents with respiratory symptoms and concerns about low MCV.  Two days ago she was seen in the ED for wheezing and shortness of breath. She had temporary relief with her son's albuterol  nebulizer and inhaler. CT and chest x-ray reportedly showed no acute process. She was started on prednisone , which has resolved her wheezing and dyspnea but is causing insomnia. She denies current shortness of breath or wheeze and uses her own albuterol  inhaler as needed. She was also prescribed doxycycline  for this episode.  She reports heavy, irregular menstrual bleeding attributed to a fibroid. Menses occur every couple of months, sometimes twice in a month, with very heavy flow. She takes an iron supplement every other day. Ferritin and iron were normal about 10 months ago. She does not have anemia, but her MCV remains low, which is her main concern today.  She smokes but is trying to quit. She takes rosuvastatin  5 mg every other day for hyperlipidemia.          The 10-year ASCVD risk score (Arnett DK, et al., 2019) is: 12.9%  Health Maintenance Due  Topic Date Due   COVID-19 Vaccine (1) Never done   Pneumococcal Vaccine (1 of 2 - PCV) Never done   Hepatitis B Vaccines 19-59 Average Risk (1 of 3 - 19+ 3-dose series) Never done   DTaP/Tdap/Td (2 - Td or Tdap) 04/22/2022      Objective:     BP 134/80   Pulse 75   Ht 5' 7 (1.702 m)   Wt 225 lb 6.4 oz (102.2 kg)   LMP 06/06/2024 (Exact Date)   SpO2 99%   BMI 35.30 kg/m    Physical Exam   Gen: alert, oriented Pulm: no respiratory distressLungs clear to auscultation bilaterally. No wheezes, rhonchi, or crackles.  Psych: pleasant  affect       No results found for any visits on 06/10/24.      Assessment & Plan:   Tobacco use Assessment & Plan: Recent ED visit for respiratory symptoms including wheezing. No hx of asthma or copd but is a smoker.. past Imaging negative for emphysema. Referred to pulmonology for further evaluation. - Continue steroid taper as prescribed. - Use albuterol  as needed for wheezing. - Follow up with pulmonology for pulmonary function test. - Contact [facility] if no response in a couple of weeks.   Microcytosis Assessment & Plan: Persistent low MCV with normal ferritin and iron levels. No thalassemia or sickle cell trait. Possible mild iron deficiency due to heavy bleeding. Testing for lead, ceruloplasmin levels - all normal.  Will hold off on any additional workup unless pt develops anemia.  - Take iron supplement every other day to prevent iron deficiency. - Monitor for signs of anemia and consider referral to hematologist if anemia develops.   Hyperlipidemia associated with type 2 diabetes mellitus (HCC) Assessment & Plan: Currently on rosuvastatin  5 mg every other day. Plan to increase to daily dosing for improved cholesterol management. - Increase rosuvastatin  to daily dosing.   Menorrhagia with regular cycle Assessment & Plan: Heavy bleeding likely related to uterine fibroid. Scheduled gynecologist visit in February for further  evaluation. - Continue follow up with gynecologist in February for evaluation and management of fibroid and heavy bleeding.   Type 2 diabetes mellitus without complication, without long-term current use of insulin  [facility]) Assessment & Plan: Most recent A1c 6.2. continue metformin .           Return in about 4 months (around 10/08/2024) for DM.    Toribio MARLA Slain, MD  "

## 2024-06-10 NOTE — Assessment & Plan Note (Signed)
 Recent ED visit for respiratory symptoms including wheezing. No hx of asthma or copd but is a smoker.. past Imaging negative for emphysema. Referred to pulmonology for further evaluation. - Continue steroid taper as prescribed. - Use albuterol  as needed for wheezing. - Follow up with pulmonology for pulmonary function test. - Contact [facility] if no response in a couple of weeks.

## 2024-06-12 DIAGNOSIS — A6000 Herpesviral infection of urogenital system, unspecified: Secondary | ICD-10-CM | POA: Insufficient documentation

## 2024-06-12 NOTE — Assessment & Plan Note (Signed)
 Most recent A1c 6.2. continue metformin .

## 2024-06-27 ENCOUNTER — Emergency Department

## 2024-06-27 ENCOUNTER — Emergency Department
Admission: EM | Admit: 2024-06-27 | Discharge: 2024-06-27 | Disposition: A | Discharge status: Home / Self Care | Attending: Emergency Medicine | Admitting: Emergency Medicine

## 2024-06-27 ENCOUNTER — Other Ambulatory Visit: Payer: Self-pay

## 2024-06-27 DIAGNOSIS — K2940 Chronic atrophic gastritis without bleeding: Secondary | ICD-10-CM | POA: Insufficient documentation

## 2024-06-27 DIAGNOSIS — R1030 Lower abdominal pain, unspecified: Secondary | ICD-10-CM | POA: Diagnosis present

## 2024-06-27 LAB — CBC
HCT: 41.4 % (ref 36.0–46.0)
Hemoglobin: 13.3 g/dL (ref 12.0–15.0)
MCH: 22.9 pg — ABNORMAL LOW (ref 26.0–34.0)
MCHC: 32.1 g/dL (ref 30.0–36.0)
MCV: 71.1 fL — ABNORMAL LOW (ref 80.0–100.0)
Platelets: 302 10*3/uL (ref 150–400)
RBC: 5.82 MIL/uL — ABNORMAL HIGH (ref 3.87–5.11)
RDW: 16.1 % — ABNORMAL HIGH (ref 11.5–15.5)
WBC: 7.5 10*3/uL (ref 4.0–10.5)
nRBC: 0 % (ref 0.0–0.2)

## 2024-06-27 LAB — LIPASE, BLOOD: Lipase: 43 U/L (ref 11–51)

## 2024-06-27 LAB — URINALYSIS, ROUTINE W REFLEX MICROSCOPIC
Bilirubin Urine: NEGATIVE
Glucose, UA: NEGATIVE mg/dL
Hgb urine dipstick: NEGATIVE
Ketones, ur: NEGATIVE mg/dL
Leukocytes,Ua: NEGATIVE
Nitrite: NEGATIVE
Protein, ur: NEGATIVE mg/dL
Specific Gravity, Urine: 1.018 (ref 1.005–1.030)
pH: 5 (ref 5.0–8.0)

## 2024-06-27 LAB — COMPREHENSIVE METABOLIC PANEL WITH GFR
ALT: 12 U/L (ref 0–44)
AST: 21 U/L (ref 15–41)
Albumin: 4.2 g/dL (ref 3.5–5.0)
Alkaline Phosphatase: 66 U/L (ref 38–126)
Anion gap: 12 (ref 5–15)
BUN: 9 mg/dL (ref 6–20)
CO2: 27 mmol/L (ref 22–32)
Calcium: 9.6 mg/dL (ref 8.9–10.3)
Chloride: 101 mmol/L (ref 98–111)
Creatinine, Ser: 0.89 mg/dL (ref 0.44–1.00)
GFR, Estimated: >60 mL/min
Glucose, Bld: 136 mg/dL — ABNORMAL HIGH (ref 70–99)
Potassium: 3.8 mmol/L (ref 3.5–5.1)
Sodium: 139 mmol/L (ref 135–145)
Total Bilirubin: 0.5 mg/dL (ref 0.0–1.2)
Total Protein: 7.4 g/dL (ref 6.5–8.1)

## 2024-06-27 LAB — LACTIC ACID, PLASMA: Lactic Acid, Venous: 0.7 mmol/L (ref 0.5–1.9)

## 2024-06-27 LAB — POC URINE PREG, ED: Preg Test, Ur: NEGATIVE

## 2024-06-27 LAB — TROPONIN T, HIGH SENSITIVITY: Troponin T High Sensitivity: 8 ng/L (ref 0–19)

## 2024-06-27 MED ORDER — PANTOPRAZOLE SODIUM 40 MG IV SOLR
40.0000 mg | Freq: Once | INTRAVENOUS | Status: AC
  Administered 2024-06-27: 40 mg via INTRAVENOUS
  Filled 2024-06-27: qty 10

## 2024-06-27 MED ORDER — ONDANSETRON HCL 4 MG/2ML IJ SOLN
4.0000 mg | Freq: Once | INTRAMUSCULAR | Status: AC
  Administered 2024-06-27: 4 mg via INTRAVENOUS
  Filled 2024-06-27: qty 2

## 2024-06-27 MED ORDER — ACETAMINOPHEN 500 MG PO TABS
1000.0000 mg | ORAL_TABLET | Freq: Once | ORAL | Status: AC
  Administered 2024-06-27: 1000 mg via ORAL
  Filled 2024-06-27: qty 2

## 2024-06-27 MED ORDER — PANTOPRAZOLE SODIUM 40 MG PO TBEC: 40.0000 mg | DELAYED_RELEASE_TABLET | Freq: Every day | ORAL | 0 refills | Status: AC

## 2024-06-27 MED ORDER — IOHEXOL 300 MG/ML  SOLN
100.0000 mL | Freq: Once | INTRAMUSCULAR | Status: AC | PRN
  Administered 2024-06-27: 100 mL via INTRAVENOUS

## 2024-06-27 MED ORDER — HYDROMORPHONE HCL 1 MG/ML IJ SOLN
0.5000 mg | Freq: Once | INTRAMUSCULAR | Status: AC
  Administered 2024-06-27: 0.5 mg via INTRAVENOUS
  Filled 2024-06-27: qty 0.5

## 2024-06-27 MED ORDER — ONDANSETRON 4 MG PO TBDP: 4.0000 mg | ORAL_TABLET | Freq: Three times a day (TID) | ORAL | 0 refills | Status: AC | PRN

## 2024-06-27 MED ORDER — SUCRALFATE 1 G PO TABS: 1.0000 g | ORAL_TABLET | Freq: Three times a day (TID) | ORAL | 0 refills | Status: AC

## 2024-06-27 NOTE — ED Triage Notes (Signed)
 C/O mid to lower abd pain and lower back pain x 3 days.  C/O urinary urgency and frequency  AAOx3. Skin warm and dry. NAD

## 2024-06-27 NOTE — Discharge Instructions (Addendum)
 You can call GI to make a follow-up appointment to discuss possible endoscopy if needed given gastritis Avoid any alcohol, BC powder, ibuprofen , smoking as these can all cause irritation of the stomach or gastritis Take tylenol  1 g every 8 hours for the next 1 week to help with any pain Take Protonix  to help with acid Carafate  helps line the stomach Call your primary care doctor to discuss the CT scan as below I discussed with our vascular team and they states that this is  Non specific for pathology- No vascular surgery follow up at this time needs PCP; Diabetes control, weight loss and smoking cessation. I discussed with rads who also stated that this is very nonspecific but could follow-up with her primary care doctor for vasculitis workup outpatient.   If you develop worsening symptoms such as worsening abdominal pain, blood in your stool, fevers or change in symptoms she is return to the ER for repeat evaluation.    IMPRESSION: 1. There is some ill-defined fat stranding adjacent to the origins of the celiac and SMA without evidence of underlying stenosis or luminal irregularity. This is nonspecific and could be incidental. Differential considerations include underlying vasculitis or fibromatosis. Recommend correlation with clinical values. 2. Stomach is decompressed with mild rugal fold prominence. This could reflect gastritis in the appropriate clinical setting.

## 2024-06-27 NOTE — ED Provider Notes (Addendum)
 "  [facility] Provider Note    Event Date/Time   First MD Initiated Contact with Patient 06/27/24 1120     (approximate)   History   Abdominal Pain   HPI  Robin Fox is a 49 y.o. female who comes in with mid to lower abdominal and back pain over the past 3 day-4 days.  Contrary to triage note she denies any urinary symptoms.  She denies any vaginal discharge or new sexual partners.  Patient reports that most the pain is actually in the upper abdomen worse after eating.  She but she does report some lower abdominal discomforts as well.  She reports has been going on for the past couple of days.  She does report significant BC powder use.  She denies any chest pain, shortness of breath but does reports that sometimes when she eats she feels a good burning sensation up into her chest.  She denies any history of abdominal surgeries.  Denies any vaginal discharge. Patient denies any blood in her stools, black stools.    Physical Exam   Triage Vital Signs: ED Triage Vitals  Encounter Vitals Group     BP 06/27/24 0728 (!) 143/95     Girls Systolic BP Percentile --      Girls Diastolic BP Percentile --      Boys Systolic BP Percentile --      Boys Diastolic BP Percentile --      Pulse Rate 06/27/24 0728 81     Resp 06/27/24 0728 16     Temp 06/27/24 0730 98 F (36.7 C)     Temp src --      SpO2 06/27/24 0728 97 %     Weight 06/27/24 0727 225 lb 5 oz (102.2 kg)     Height --      Head Circumference --      Peak Flow --      Pain Score 06/27/24 0727 9     Pain Loc --      Pain Education --      Exclude from Growth Chart --     Most recent vital signs: Vitals:   06/27/24 0728 06/27/24 0730  BP: (!) 143/95   Pulse: 81   Resp: 16   Temp:  98 F (36.7 C)  SpO2: 97%      General: Awake, no distress.  CV:  Good peripheral perfusion.  Resp:  Normal effort.  Abd:  No distention.  Slight tenderness throughout the abdomen but little bit  more in the upper abdomen without any rebound, guarding Other:     ED Results / Procedures / Treatments   Labs (all labs ordered are listed, but only abnormal results are displayed) Labs Reviewed  COMPREHENSIVE METABOLIC PANEL WITH GFR - Abnormal; Notable for the following components:      Result Value   Glucose, Bld 136 (*)    All other components within normal limits  CBC - Abnormal; Notable for the following components:   RBC 5.82 (*)    MCV 71.1 (*)    MCH 22.9 (*)    RDW 16.1 (*)    All other components within normal limits  URINALYSIS, ROUTINE W REFLEX MICROSCOPIC - Abnormal; Notable for the following components:   Color, Urine YELLOW (*)    APPearance CLEAR (*)    All other components within normal limits  LIPASE, BLOOD  POC URINE PREG, ED     EKG  My interpretation of EKG:  Normal sinus rate of 76 without any ST elevation or T wave versions, normal intervals  RADIOLOGY I have reviewed the us   personally and interpreted no evidence of any gallstones   PROCEDURES:  Critical Care performed: No  Procedures   MEDICATIONS ORDERED IN ED: Medications  HYDROmorphone  (DILAUDID ) injection 0.5 mg (0.5 mg Intravenous Given 06/27/24 1142)  ondansetron  (ZOFRAN ) injection 4 mg (4 mg Intravenous Given 06/27/24 1141)  iohexol  (OMNIPAQUE ) 300 MG/ML solution 100 mL (100 mLs Intravenous Contrast Given 06/27/24 1209)  acetaminophen  (TYLENOL ) tablet 1,000 mg (1,000 mg Oral Given 06/27/24 1420)  pantoprazole  (PROTONIX ) injection 40 mg (40 mg Intravenous Given 06/27/24 1421)     IMPRESSION / MDM / ASSESSMENT AND PLAN / ED COURSE  I reviewed the triage vital signs and the nursing notes.   Patient's presentation is most consistent with acute presentation with potential threat to life or bodily function.   Patient reports coming in with abdominal pain.  Pain medication with some Dilaudid , Zofran  were ordered to help with patient's symptoms.  Patient reports pain all over but also some  upper abdominal pain so we will get ultrasound evaluate for gallstones and CT to make sure no evidence of any diverticulitis, appendicitis, kidney stone or other acute pathology.  Pregnancy test was negative.  Lipase was normal CMP reassuring CBC reassuring urine without evidence of UTI  IMPRESSION: Unremarkable right upper quadrant ultrasound.   Troponin was negative and does not sound cardiac in nature and she has had symptoms for 3 days.   IMPRESSION: 1. There is some ill-defined fat stranding adjacent to the origins of the celiac and SMA without evidence of underlying stenosis or luminal irregularity. This is nonspecific and could be incidental. Differential considerations include underlying vasculitis or fibromatosis. Recommend correlation with clinical values. 2. Stomach is decompressed with mild rugal fold prominence. This could reflect gastritis in the appropriate clinical setting.  d/w Dr correne does not see anthing that looks concerning for issues with the arteries that a CT angio would be beneficial.  That the recommended correlating with clinical values is more an outpatient basis for vasculitis workup but that typically these are just incidental and not related.  Patient's lactate is normal and given the CT scan does show open arteries with normal lactate I have low suspicion for acute ischemia, she has no pain out of proportion to examination, no blood in her stool.  Patient denied any black tarry stools we did discuss this was with her compression as well.  Her hemoglobin was reassuring   Patient reports feeling improved with symptomatic treatment.  I did discuss with Dr. Tisa to see if they would follow-up for this with her primary care doctor for further workup. Non specific for pathology- No vascular surgery follow up at this time needs PCP; DM control, weight loss and smoking cessation.   Discussed with patient and updated her on this her sugars here are well-controlled  in the 130s but she expressed understanding for the above.  She will follow-up with her primary care doctor.  In the meantime we will start her on Protonix , Carafate , Tylenol  and refer her to GI as needed.  Repeat abdominal exam is soft and nontender.  Patient expressed understanding felt much improved and felt comfortable with this plan.  She is tolerating eating.  Patient also did report that she was recently on some prednisone  for concerns for a asthma flare.  I did talk with her how this also could have worsen the gastritis and could  have contributed to the symptoms.  We discussed avoiding BC powder, ibuprofen , alcohol, smoking and giving this more time for it to improve.    FINAL CLINICAL IMPRESSION(S) / ED DIAGNOSES   Final diagnoses:  Atrophic gastritis without hemorrhage     Rx / DC Orders   ED Discharge Orders          Ordered    pantoprazole  (PROTONIX ) 40 MG tablet  Daily        06/27/24 1457    sucralfate  (CARAFATE ) 1 g tablet  3 times daily with meals & bedtime        06/27/24 1457             Note:  This document was prepared using Dragon voice recognition software and may include unintentional dictation errors.   Ernest Ronal BRAVO, MD 06/27/24 1504    Ernest Ronal BRAVO, MD 06/27/24 1510  "

## 2024-06-28 ENCOUNTER — Ambulatory Visit: Admitting: Obstetrics and Gynecology

## 2024-07-08 ENCOUNTER — Ambulatory Visit: Admitting: Obstetrics and Gynecology

## 2024-10-11 ENCOUNTER — Ambulatory Visit: Admitting: Family Medicine
